# Patient Record
Sex: Male | Born: 1955 | Race: White | Hispanic: No | Marital: Married | Smoking: Current every day smoker
Health system: Southern US, Community
[De-identification: ages and names within clinical notes are randomized; demographics above are authoritative.]

## PROBLEM LIST (undated history)

## (undated) DIAGNOSIS — T8859XA Other complications of anesthesia, initial encounter: Secondary | ICD-10-CM

## (undated) DIAGNOSIS — T4145XA Adverse effect of unspecified anesthetic, initial encounter: Secondary | ICD-10-CM

## (undated) DIAGNOSIS — I2510 Atherosclerotic heart disease of native coronary artery without angina pectoris: Secondary | ICD-10-CM

## (undated) DIAGNOSIS — R7303 Prediabetes: Secondary | ICD-10-CM

## (undated) DIAGNOSIS — J449 Chronic obstructive pulmonary disease, unspecified: Secondary | ICD-10-CM

## (undated) DIAGNOSIS — R001 Bradycardia, unspecified: Secondary | ICD-10-CM

## (undated) DIAGNOSIS — N200 Calculus of kidney: Secondary | ICD-10-CM

## (undated) DIAGNOSIS — F419 Anxiety disorder, unspecified: Secondary | ICD-10-CM

## (undated) DIAGNOSIS — M1990 Unspecified osteoarthritis, unspecified site: Secondary | ICD-10-CM

## (undated) DIAGNOSIS — I6529 Occlusion and stenosis of unspecified carotid artery: Secondary | ICD-10-CM

## (undated) DIAGNOSIS — I10 Essential (primary) hypertension: Secondary | ICD-10-CM

## (undated) DIAGNOSIS — B1920 Unspecified viral hepatitis C without hepatic coma: Secondary | ICD-10-CM

## (undated) DIAGNOSIS — I739 Peripheral vascular disease, unspecified: Secondary | ICD-10-CM

## (undated) DIAGNOSIS — K5792 Diverticulitis of intestine, part unspecified, without perforation or abscess without bleeding: Secondary | ICD-10-CM

## (undated) DIAGNOSIS — Z87442 Personal history of urinary calculi: Secondary | ICD-10-CM

## (undated) DIAGNOSIS — J45909 Unspecified asthma, uncomplicated: Secondary | ICD-10-CM

## (undated) DIAGNOSIS — I700 Atherosclerosis of aorta: Secondary | ICD-10-CM

## (undated) DIAGNOSIS — I509 Heart failure, unspecified: Secondary | ICD-10-CM

## (undated) DIAGNOSIS — M109 Gout, unspecified: Secondary | ICD-10-CM

## (undated) DIAGNOSIS — J40 Bronchitis, not specified as acute or chronic: Secondary | ICD-10-CM

## (undated) DIAGNOSIS — E785 Hyperlipidemia, unspecified: Secondary | ICD-10-CM

## (undated) DIAGNOSIS — G894 Chronic pain syndrome: Secondary | ICD-10-CM

## (undated) DIAGNOSIS — F1011 Alcohol abuse, in remission: Secondary | ICD-10-CM

## (undated) DIAGNOSIS — G8929 Other chronic pain: Secondary | ICD-10-CM

## (undated) DIAGNOSIS — D509 Iron deficiency anemia, unspecified: Secondary | ICD-10-CM

## (undated) DIAGNOSIS — I447 Left bundle-branch block, unspecified: Secondary | ICD-10-CM

## (undated) DIAGNOSIS — I429 Cardiomyopathy, unspecified: Secondary | ICD-10-CM

## (undated) DIAGNOSIS — K219 Gastro-esophageal reflux disease without esophagitis: Secondary | ICD-10-CM

## (undated) DIAGNOSIS — Z79899 Other long term (current) drug therapy: Secondary | ICD-10-CM

## (undated) DIAGNOSIS — Z79891 Long term (current) use of opiate analgesic: Secondary | ICD-10-CM

## (undated) DIAGNOSIS — N183 Chronic kidney disease, stage 3 unspecified: Secondary | ICD-10-CM

## (undated) DIAGNOSIS — M549 Dorsalgia, unspecified: Secondary | ICD-10-CM

## (undated) DIAGNOSIS — Z7902 Long term (current) use of antithrombotics/antiplatelets: Secondary | ICD-10-CM

## (undated) HISTORY — PX: HERNIA REPAIR: SHX51

## (undated) HISTORY — DX: Unspecified viral hepatitis C without hepatic coma: B19.20

## (undated) HISTORY — PX: BACK SURGERY: SHX140

## (undated) HISTORY — PX: CHOLECYSTECTOMY: SHX55

## (undated) HISTORY — PX: SPINAL FUSION: SHX223

## (undated) HISTORY — PX: KNEE ARTHROSCOPY: SUR90

## (undated) HISTORY — PX: CARDIAC CATHETERIZATION: SHX172

## (undated) HISTORY — DX: Essential (primary) hypertension: I10

---
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
## 2004-03-22 ENCOUNTER — Other Ambulatory Visit: Payer: Self-pay

## 2005-03-02 ENCOUNTER — Emergency Department: Payer: Self-pay | Admitting: Unknown Physician Specialty

## 2005-03-02 ENCOUNTER — Other Ambulatory Visit: Payer: Self-pay

## 2005-04-06 ENCOUNTER — Ambulatory Visit: Payer: Self-pay | Admitting: Gastroenterology

## 2005-04-27 ENCOUNTER — Ambulatory Visit: Payer: Self-pay | Admitting: Gastroenterology

## 2005-06-07 ENCOUNTER — Ambulatory Visit: Payer: Self-pay | Admitting: General Surgery

## 2005-06-10 ENCOUNTER — Ambulatory Visit: Payer: Self-pay | Admitting: General Surgery

## 2005-07-12 DIAGNOSIS — K56609 Unspecified intestinal obstruction, unspecified as to partial versus complete obstruction: Secondary | ICD-10-CM

## 2005-07-12 HISTORY — PX: FINGER SURGERY: SHX640

## 2005-07-12 HISTORY — PX: COLON RESECTION: SHX5231

## 2005-07-12 HISTORY — DX: Unspecified intestinal obstruction, unspecified as to partial versus complete obstruction: K56.609

## 2005-08-31 ENCOUNTER — Inpatient Hospital Stay: Payer: Self-pay | Admitting: Internal Medicine

## 2005-08-31 ENCOUNTER — Other Ambulatory Visit: Payer: Self-pay

## 2005-09-01 ENCOUNTER — Other Ambulatory Visit: Payer: Self-pay

## 2005-10-20 ENCOUNTER — Other Ambulatory Visit: Payer: Self-pay

## 2005-10-21 ENCOUNTER — Inpatient Hospital Stay: Payer: Self-pay | Admitting: Internal Medicine

## 2005-11-04 ENCOUNTER — Inpatient Hospital Stay: Payer: Self-pay | Admitting: Cardiology

## 2005-11-18 ENCOUNTER — Other Ambulatory Visit: Payer: Self-pay

## 2005-11-18 ENCOUNTER — Inpatient Hospital Stay: Payer: Self-pay | Admitting: Internal Medicine

## 2007-05-10 ENCOUNTER — Inpatient Hospital Stay (HOSPITAL_COMMUNITY): Admission: RE | Admit: 2007-05-10 | Discharge: 2007-05-11 | Payer: Self-pay | Admitting: Neurosurgery

## 2007-06-10 ENCOUNTER — Encounter: Admission: RE | Admit: 2007-06-10 | Discharge: 2007-06-10 | Payer: Self-pay | Admitting: Neurosurgery

## 2007-07-18 ENCOUNTER — Emergency Department (HOSPITAL_COMMUNITY): Admission: EM | Admit: 2007-07-18 | Discharge: 2007-07-18 | Payer: Self-pay | Admitting: Emergency Medicine

## 2007-08-11 ENCOUNTER — Inpatient Hospital Stay (HOSPITAL_COMMUNITY): Admission: RE | Admit: 2007-08-11 | Discharge: 2007-08-15 | Payer: Self-pay | Admitting: Neurosurgery

## 2008-07-09 ENCOUNTER — Ambulatory Visit: Payer: Self-pay | Admitting: Gastroenterology

## 2008-07-22 ENCOUNTER — Emergency Department: Payer: Self-pay | Admitting: Emergency Medicine

## 2008-10-21 ENCOUNTER — Emergency Department: Payer: Self-pay

## 2008-10-22 ENCOUNTER — Observation Stay: Payer: Self-pay | Admitting: Internal Medicine

## 2008-10-23 ENCOUNTER — Inpatient Hospital Stay: Payer: Self-pay | Admitting: Psychiatry

## 2010-06-08 ENCOUNTER — Inpatient Hospital Stay: Payer: Self-pay | Admitting: Surgery

## 2010-07-29 ENCOUNTER — Ambulatory Visit: Payer: Self-pay | Admitting: Surgery

## 2010-08-14 ENCOUNTER — Ambulatory Visit: Payer: Self-pay | Admitting: Surgery

## 2010-08-21 ENCOUNTER — Inpatient Hospital Stay: Payer: Self-pay | Admitting: Surgery

## 2010-08-24 LAB — PATHOLOGY REPORT

## 2010-11-24 NOTE — H&P (Signed)
[name], [name]                ACCOUNT NO.:  [id number]   MEDICAL RECORD NO.:  [id number]          PATIENT TYPE:  INP   LOCATION:  3172                         FACILITY:  [facility]   PHYSICIAN:  Payton Doughty, M.D.      DATE OF BIRTH:  01/10/1956   DATE OF ADMISSION:  05/10/2007  DATE OF DISCHARGE:                              HISTORY & PHYSICAL   ADMISSION DIAGNOSIS:  Lumbar spondylosis.   SERVICE:  Neurosurgery.   This is a 55 year old, right-handed, white gentleman who for about 6  months has had increasing pain in his back and down his left leg that  started spontaneously without injury. MR shows disk at 5-1 off to the  left.  He underwent lumbar epidural steroids and has not done well with  those and he is admitted now for a lumbar diskectomy.   MEDICAL HISTORY:  Remarkable for hypertrophic cardiomyopathy. He has an  ejection fraction of 17-35% and he is being cleared by his cardiologist.  He has had an emergency colectomy for ruptured diverticulum in the past.  Surgery to remove kidney stones.   ALLERGIES:  CODEINE.   MEDICATIONS:  Coreg, Lasix, spironolactone and Exforge.   SOCIAL HISTORY:  He smokes three-quarters pack of cigarettes a day. He  drinks alcohol on a limited social basis.   FAMILY HISTORY:  Dad is 80, no other history is given.   REVIEW OF SYSTEMS:  Remarkable for sinus problems, back pain, leg pain,  arthritis.   PHYSICAL EXAMINATION:  HEENT:  Within normal limits.  He has reasonable  range of motion of the neck.  CHEST:  Has diffuse crackles, no wheezes.  CARDIAC:  Regular rate and rhythm with very distant heart sounds.  NEUROLOGIC:  He is awake, alert and oriented. His cranial nerves are  intact. Motor exam shows 5/5 strength throughout the upper and lower  extremities.  Sensory dysesthesias describing the left S1 distribution.  Reflexes are flicker at the knees, flicker at the right ankle, absent at  the left. Straight leg raise is positive on the  left. MR shows a disk  off the left side extends behind the body of S1.   CLINICAL IMPRESSION:  Herniated disk at L5-S1 eccentric to the left.  He  has failed conservative management.   PLAN:  Lumbar laminectomy, diskectomy and left-side L5-S1.  He has  gotten clearance from his cardiologist. The risks and benefits have been  discussed with him and he wishes to proceed.           ______________________________  Payton Doughty, M.D.     MWR/MEDQ  D:  05/10/2007  T:  05/10/2007  Job:  [id number]

## 2010-11-24 NOTE — Discharge Summary (Signed)
[name], [name]                ACCOUNT NO.:  [id number]   MEDICAL RECORD NO.:  [id number]          PATIENT TYPE:  INP   LOCATION:  3013                         FACILITY:  [facility]   PHYSICIAN:  Payton Doughty, M.D.      DATE OF BIRTH:  06/20/1956   DATE OF ADMISSION:  08/11/2007  DATE OF DISCHARGE:  08/15/2007                               DISCHARGE SUMMARY   ADMITTING DIAGNOSIS:  Recurrent disk, L5-S1.   DISCHARGE DIAGNOSIS:  Recurrent disk, L5-S1.   PROCEDURE:  L5-S1 laminectomy and diskectomy, posterior lumbar interbody  fusion with __________  fusion cages and posterolateral arthrodesis.   COMPLICATION:  None.   DISCHARGE STATUS:  Alive and well.   BODY OF TEXT:  A 55 year old right-handed white gentleman who had a  diskectomy in September and been doing well.  Had an increase in pain.  MR showed a disk recurrence.  Medical history is remarkable for  hypertrophic cardiomyopathy.   MEDICATIONS:  1. Coreg.  2. Lasix.  3. Spironolactone.   GENERAL EXAM:  Was intact.  NEUROLOGIC EXAM:  Was intact with a left S1 radiculopathy.   He was admitted.  After ascertaining normal laboratory values, underwent  a lumbar fusion at 5-1.  Postoperatively, he did well.  Leg pain is  gone.  Had some incisional pain.  He was on a PCA and Foley for 2 days.  That was stopped yesterday.  He is up and about, eating and voiding  normally, using only oral pain medications.  His incision is dry and  well-healing.  He is afebrile.  His vital signs are stable.  He is being  discharged home in the care of his family.  His follow-up will be in the  [place] office in a week for sutures.           ______________________________  Payton Doughty, M.D.     MWR/MEDQ  D:  08/15/2007  T:  08/15/2007  Job:  [id number]

## 2010-11-24 NOTE — H&P (Signed)
NAMESANTANA, [name]                ACCOUNT NO.:  [id number]   MEDICAL RECORD NO.:  [id number]          PATIENT TYPE:  INP   LOCATION:  3172                         FACILITY:  [facility]   PHYSICIAN:  Payton Doughty, M.D.      DATE OF BIRTH:  [birth year]   DATE OF ADMISSION:  08/11/2007  DATE OF DISCHARGE:                              HISTORY & PHYSICAL   ADMISSION DIAGNOSIS:  Recurrent disk on the left side at L5-S1.   HISTORY:  A 55 year old right-handed white gentleman that I operated on  in September for L5-S1 disk on the left.  He did well.  He was lifting  some heavy things and had increased back pain and pain down his left  leg.  MR shows a recurrent disk and he is admitted now for fusion.   PAST MEDICAL HISTORY:  Remarkable hypertrophic cardiomyopathy.  He did  okay with his operation in September.   ALLERGIES:  CODEINE.   MEDICATIONS:  1. Coreg.  2. Lasix.  3. Spironolactone.  4. Exforge.   SOCIAL HISTORY:  He smokes three-quarters of a pack cigarettes a day.  He drinks alcohol on a social basis.   FAMILY HISTORY:  Mom is 79, no other history is given.   REVIEW OF SYSTEMS:  Marked sinus problems, back pain, leg pain,  arthritis.   PHYSICAL EXAMINATION:  HEENT:  Within normal limits.  He has reasonable  range of motion of the neck.  CHEST:  Diffuse crackles.  No wheezes.  CARDIAC:  Regular rate and rhythm.  Very distant heart sounds.  NEUROLOGICALLY:  He is awake, alert and oriented.  His cranial nerves  are intact.  MOTOR:  Exam shows 5/5 strength throughout the upper and lower  extremities.  Sensory dysesthesias as described in the left S1 distribution.  Positive  straight leg on the right.  Left deep tendon reflexes are absent in the  left ankle.   DIAGNOSTICS:  MR shows recurrent disk at L5-S1 on the left.   CLINICAL IMPRESSION:  Recurrent disk at L5-S1.   PLAN:  Lumbar fusion.  Risks and benefits have been discussed with him  and he wishes to proceed.         ______________________________  Payton Doughty, M.D.     MWR/MEDQ  D:  08/11/2007  T:  08/11/2007  Job:  [id number]

## 2010-11-24 NOTE — Op Note (Signed)
NAMELOYCE, KLASEN                ACCOUNT NO.:  [id number]   MEDICAL RECORD NO.:  [id number]          PATIENT TYPE:  INP   LOCATION:  2899                         FACILITY:  [facility]   PHYSICIAN:  Payton Doughty, M.D.      DATE OF BIRTH:  1955/12/19   DATE OF PROCEDURE:  05/10/2007  DATE OF DISCHARGE:                               OPERATIVE REPORT   PREOPERATIVE DIAGNOSIS:  Herniated disk on the left side at L5-S1.   POSTOPERATIVE DIAGNOSIS:  Herniated disk on the left side at L5-S1.   OPERATIVE PROCEDURE:  L5-S1 laminectomy/diskectomy on the left.   SURGEON:  Payton Doughty, M.D.   ANESTHESIA:  General endotracheal.   PREP:  Sterile Betadine prep and scrub with alcohol wipe.   COMPLICATIONS:  None.   NURSE ASSISTANT:  Covington.   PROCEDURE:  A 55 year old gentleman with left S1 radiculopathy and a  herniated disk at 5-1 on the left was taken to the operating room,  smoothly anesthetized, intubated, and placed prone on the operating  table.  Following shave, prep and drape in the usual sterile fashion,  the skin was infiltrated with 1% lidocaine with 1:400,000 epinephrine.  The skin was incised at L5-S1, the lamina of L5 was dissected free in  the subperiosteal plane.  Intraoperative x-ray confirmed correctness  level.  Hemisemilaminectomy was carried out at L5 up to the top of the  ligamentum flavum that was removed in retrograde fashion.  S1 was  undercut and foraminotomy carried out over the left S1 root.  The root  was mobilized medially and fairly hard disk was identified, it was  excised with the 11 blade.  The disk space was explored and all  graspable fragments removed.  The nerve root was explored and all  quadrants found to be open.  The wound was irrigated, hemostasis  assured, and Depo-Medrol soaked fat was placed in the laminotomy defect.  Successive layers of O Vicryl, 2-0 Vicryl and 4-0 Vicryl were used to  close.  Benzoin and Steri-Strips were placed, made occlusive  with Telfa  and OpSite, and the patient returned to the recovery room in good  condition.           ______________________________  Payton Doughty, M.D.     MWR/MEDQ  D:  05/10/2007  T:  05/10/2007  Job:  [id number]

## 2010-11-24 NOTE — Op Note (Signed)
[name], [name]                ACCOUNT NO.:  [id number]   MEDICAL RECORD NO.:  [id number]          PATIENT TYPE:  INP   LOCATION:  3302                         FACILITY:  [facility]   PHYSICIAN:  Payton Doughty, M.D.      DATE OF BIRTH:  26-Nov-1955   DATE OF PROCEDURE:  08/11/2007  DATE OF DISCHARGE:                               OPERATIVE REPORT   PREOPERATIVE DIAGNOSIS:  Recurrent disk on the left side at L5-S1.   POSTOPERATIVE DIAGNOSIS:  Recurrent disk on the left side at L5-S1.   OPERATIVE PROCEDURE:  L5-S1 laminectomy and diskectomy, posterior lumbar  interbody fusion with Ray Threaded Fusion Cages, posterolateral  arthrodesis with OP-1.   SURGEON:  Payton Doughty, M.D., service neurosurgery.   ANESTHESIA:  General endotracheal.   PREPARATION:  Sterile Betadine prep and scrub with alcohol wipe.   COMPLICATIONS:  None.   NURSE ASSISTANT:  Covington.   DOCTOR ASSISTANT:  Hilda Lias, M.D.   BODY OF TEXT:  This is a 55 year old gentleman with a recurrent disk on  the left side at L5-S1.  Taken to the operating room and smoothly  anesthetized and intubated, placed prone on the operating table  following shave, prep and drape in the usual sterile fashion.  The skin  was infiltarated with 1% lidocaine with 1:400,000 epinephrine.  The skin  was incised from mid L4 to mid S1 and the lamina of L5 and the sacral  ala were exposed bilaterally in a subperiosteal plane.  Intraoperative x-  ray confirmed correctness of the level.  Having confirmed correctness of  the level, the pars interarticularis, remaining lamina and inferior  facet of L5 and the superior facet of S1 were removed using the high-  speed drill.  The ligamentum flavum was removed.  On the left side there  was some disk recurrence as well as some scarring around the left S1  root.  This was removed without difficulty.  The disk space was entered  and all graspable disk fragments were removed.  The same thing was  carried out on the right side, which had more normal anatomy.  Following  complete decompression, 12 x 21-mm Ray Threaded Fusion Cages were placed  using the standard technique.  They were packed with bone graft  harvested from the facet joints.  It was not felt that pedicle screws  were necessary.  The transverse process and sacral ala were decorticated  with a high-speed drill and packed with BMP on the extender matrix.  The  Ray Cages were packed with bone graft harvested from the facet  joints and capped.  Final x-ray showed good placement of cages.  Successive layers of 0 Vicryl, 2-0 Vicryl and 3-0 nylon were used to  close.  Betadine Telfa dressing was applied.  The patient returned to  the recovery room in good condition.           ______________________________  Payton Doughty, M.D.     MWR/MEDQ  D:  08/11/2007  T:  08/12/2007  Job:  [id number]

## 2011-04-01 LAB — DIFFERENTIAL
Basophils Absolute: 0
Basophils Relative: 0
Lymphocytes Relative: 13
Lymphs Abs: 1.2
Monocytes Absolute: 0.8
Monocytes Relative: 9
Neutrophils Relative %: 76

## 2011-04-01 LAB — BASIC METABOLIC PANEL
Calcium: 8.3 — ABNORMAL LOW
Chloride: 93 — ABNORMAL LOW
Creatinine, Ser: 1.38
GFR calc non Af Amer: 55 — ABNORMAL LOW

## 2011-04-01 LAB — COMPREHENSIVE METABOLIC PANEL
ALT: 14
AST: 20
BUN: 9
GFR calc non Af Amer: >60
Glucose, Bld: 107 — ABNORMAL HIGH
Potassium: 5.2 — ABNORMAL HIGH
Sodium: 131 — ABNORMAL LOW
Total Bilirubin: 0.9

## 2011-04-01 LAB — CBC
Hemoglobin: 14
MCV: 86.8
Platelets: 235
RBC: 4.79
WBC: 9.2

## 2011-04-01 LAB — URINALYSIS, ROUTINE W REFLEX MICROSCOPIC
Hgb urine dipstick: NEGATIVE
Nitrite: NEGATIVE
Protein, ur: NEGATIVE
Urobilinogen, UA: 0.2

## 2011-04-01 LAB — APTT: aPTT: 28

## 2011-04-01 LAB — TYPE AND SCREEN: Antibody Screen: NEGATIVE

## 2011-04-01 LAB — PROTIME-INR: Prothrombin Time: 11.4 — ABNORMAL LOW

## 2011-04-02 LAB — BASIC METABOLIC PANEL
BUN: 12
CO2: 29
GFR calc Af Amer: >60
GFR calc non Af Amer: >60
Glucose, Bld: 115 — ABNORMAL HIGH
Potassium: 4.2
Sodium: 126 — ABNORMAL LOW

## 2011-04-21 LAB — ABO/RH: ABO/RH(D): A POS

## 2011-04-21 LAB — URINALYSIS, ROUTINE W REFLEX MICROSCOPIC
Glucose, UA: NEGATIVE
Ketones, ur: NEGATIVE
Protein, ur: NEGATIVE
Specific Gravity, Urine: 1.005

## 2011-04-21 LAB — DIFFERENTIAL
Basophils Absolute: 0.1
Basophils Relative: 1
Eosinophils Absolute: 0.2
Eosinophils Relative: 3
Lymphocytes Relative: 15
Lymphs Abs: 1.2

## 2011-04-21 LAB — COMPREHENSIVE METABOLIC PANEL
ALT: 10
AST: 19
Alkaline Phosphatase: 82
BUN: 10
Chloride: 94 — ABNORMAL LOW
GFR calc non Af Amer: 54 — ABNORMAL LOW
Total Bilirubin: 0.7
Total Protein: 6.6

## 2011-04-21 LAB — TYPE AND SCREEN
ABO/RH(D): A POS
Antibody Screen: NEGATIVE

## 2011-04-21 LAB — CBC
Hemoglobin: 13.4
MCHC: 33.8
Platelets: 330
RDW: 15.4 — ABNORMAL HIGH

## 2011-04-21 LAB — APTT: aPTT: 29

## 2011-04-21 LAB — PROTIME-INR: Prothrombin Time: 12.2

## 2011-05-05 ENCOUNTER — Inpatient Hospital Stay: Payer: Self-pay | Admitting: Internal Medicine

## 2011-05-26 ENCOUNTER — Inpatient Hospital Stay: Payer: Self-pay | Admitting: Specialist

## 2011-12-30 ENCOUNTER — Ambulatory Visit: Payer: Self-pay | Admitting: Emergency Medicine

## 2011-12-30 DIAGNOSIS — I10 Essential (primary) hypertension: Secondary | ICD-10-CM

## 2011-12-30 LAB — COMPREHENSIVE METABOLIC PANEL
Albumin: 3.5 g/dL (ref 3.4–5.0)
Alkaline Phosphatase: 82 U/L (ref 50–136)
Bilirubin,Total: 0.6 mg/dL (ref 0.2–1.0)
Chloride: 103 mmol/L (ref 98–107)
Co2: 28 mmol/L (ref 21–32)
EGFR (African American): >60
EGFR (Non-African Amer.): >60
Osmolality: 279 (ref 275–301)
SGOT(AST): 22 U/L (ref 15–37)

## 2011-12-30 LAB — CBC WITH DIFFERENTIAL/PLATELET
Basophil #: 0.1 10*3/uL (ref 0.0–0.1)
Basophil %: 0.9 %
Eosinophil %: 2.5 %
HCT: 43.4 % (ref 40.0–52.0)
Lymphocyte #: 1.3 10*3/uL (ref 1.0–3.6)
Lymphocyte %: 17.1 %
MCHC: 32.6 g/dL (ref 32.0–36.0)
MCV: 77 fL — ABNORMAL LOW (ref 80–100)
Neutrophil %: 71 %
RBC: 5.61 10*6/uL (ref 4.40–5.90)
WBC: 7.7 10*3/uL (ref 3.8–10.6)

## 2012-01-11 ENCOUNTER — Inpatient Hospital Stay: Payer: Self-pay | Admitting: Emergency Medicine

## 2012-01-11 LAB — BASIC METABOLIC PANEL
Anion Gap: 6 — ABNORMAL LOW (ref 7–16)
BUN: 23 mg/dL — ABNORMAL HIGH (ref 7–18)
Co2: 25 mmol/L (ref 21–32)
Creatinine: 1.29 mg/dL (ref 0.60–1.30)
EGFR (African American): >60

## 2012-01-11 LAB — CBC WITH DIFFERENTIAL/PLATELET
Basophil #: 0 10*3/uL (ref 0.0–0.1)
Eosinophil #: 0 10*3/uL (ref 0.0–0.7)
HCT: 48.1 % (ref 40.0–52.0)
Lymphocyte %: 4.4 %
MCH: 24.4 pg — ABNORMAL LOW (ref 26.0–34.0)
MCHC: 31.3 g/dL — ABNORMAL LOW (ref 32.0–36.0)
MCV: 78 fL — ABNORMAL LOW (ref 80–100)
Monocyte %: 1.5 %
Neutrophil #: 15.2 10*3/uL — ABNORMAL HIGH (ref 1.4–6.5)
Neutrophil %: 93.8 %
Platelet: 239 10*3/uL (ref 150–440)
RDW: 15.5 % — ABNORMAL HIGH (ref 11.5–14.5)

## 2012-01-12 LAB — CBC WITH DIFFERENTIAL/PLATELET
Basophil #: 0 10*3/uL (ref 0.0–0.1)
Basophil %: 0 %
Eosinophil #: 0 10*3/uL (ref 0.0–0.7)
MCH: 24.7 pg — ABNORMAL LOW (ref 26.0–34.0)
MCHC: 31.7 g/dL — ABNORMAL LOW (ref 32.0–36.0)
Monocyte #: 0.3 x10 3/mm (ref 0.2–1.0)
Neutrophil %: 93.4 %
Platelet: 241 10*3/uL (ref 150–440)
RDW: 15.3 % — ABNORMAL HIGH (ref 11.5–14.5)

## 2012-05-20 ENCOUNTER — Inpatient Hospital Stay: Payer: Self-pay | Admitting: Internal Medicine

## 2012-05-20 LAB — PRO B NATRIURETIC PEPTIDE: B-Type Natriuretic Peptide: 1870 pg/mL — ABNORMAL HIGH (ref 0–125)

## 2012-05-20 LAB — BASIC METABOLIC PANEL
Anion Gap: 7 (ref 7–16)
BUN: 30 mg/dL — ABNORMAL HIGH (ref 7–18)
Calcium, Total: 8.8 mg/dL (ref 8.5–10.1)
Co2: 30 mmol/L (ref 21–32)
EGFR (Non-African Amer.): >60
Glucose: 132 mg/dL — ABNORMAL HIGH (ref 65–99)
Osmolality: 280 (ref 275–301)
Potassium: 4.1 mmol/L (ref 3.5–5.1)
Sodium: 136 mmol/L (ref 136–145)

## 2012-05-20 LAB — MAGNESIUM: Magnesium: 1.8 mg/dL

## 2012-05-20 LAB — TROPONIN I: Troponin-I: 0.02 ng/mL

## 2012-05-20 LAB — HEPATIC FUNCTION PANEL A (ARMC)
Albumin: 3 g/dL — ABNORMAL LOW (ref 3.4–5.0)
Bilirubin, Direct: 0.3 mg/dL — ABNORMAL HIGH (ref 0.00–0.20)
SGPT (ALT): 13 U/L (ref 12–78)

## 2012-05-20 LAB — CBC
MCH: 24 pg — ABNORMAL LOW (ref 26.0–34.0)
MCV: 75 fL — ABNORMAL LOW (ref 80–100)
Platelet: 247 10*3/uL (ref 150–440)
RBC: 5 10*6/uL (ref 4.40–5.90)
RDW: 14.9 % — ABNORMAL HIGH (ref 11.5–14.5)
WBC: 17 10*3/uL — ABNORMAL HIGH (ref 3.8–10.6)

## 2012-05-21 LAB — CBC WITH DIFFERENTIAL/PLATELET
Basophil #: 0 10*3/uL (ref 0.0–0.1)
Eosinophil #: 0 10*3/uL (ref 0.0–0.7)
Eosinophil %: 0 %
HCT: 40.8 % (ref 40.0–52.0)
HGB: 13 g/dL (ref 13.0–18.0)
Lymphocyte #: 0.8 10*3/uL — ABNORMAL LOW (ref 1.0–3.6)
Lymphocyte %: 4.9 %
MCH: 23.9 pg — ABNORMAL LOW (ref 26.0–34.0)
MCHC: 31.8 g/dL — ABNORMAL LOW (ref 32.0–36.0)
Monocyte #: 0.5 x10 3/mm (ref 0.2–1.0)
Monocyte %: 3.3 %
Neutrophil #: 14.1 10*3/uL — ABNORMAL HIGH (ref 1.4–6.5)
Platelet: 256 10*3/uL (ref 150–440)
RBC: 5.42 10*6/uL (ref 4.40–5.90)
RDW: 15.1 % — ABNORMAL HIGH (ref 11.5–14.5)

## 2012-05-21 LAB — BASIC METABOLIC PANEL
Anion Gap: 5 — ABNORMAL LOW (ref 7–16)
BUN: 29 mg/dL — ABNORMAL HIGH (ref 7–18)
Calcium, Total: 9.4 mg/dL (ref 8.5–10.1)
Creatinine: 1.13 mg/dL (ref 0.60–1.30)
EGFR (African American): >60
EGFR (Non-African Amer.): >60
Glucose: 207 mg/dL — ABNORMAL HIGH (ref 65–99)
Osmolality: 284 (ref 275–301)
Sodium: 136 mmol/L (ref 136–145)

## 2012-05-22 LAB — BASIC METABOLIC PANEL
Anion Gap: 7 (ref 7–16)
BUN: 31 mg/dL — ABNORMAL HIGH (ref 7–18)
Calcium, Total: 9.2 mg/dL (ref 8.5–10.1)
Chloride: 104 mmol/L (ref 98–107)
Creatinine: 1.09 mg/dL (ref 0.60–1.30)
EGFR (Non-African Amer.): >60
Glucose: 134 mg/dL — ABNORMAL HIGH (ref 65–99)
Osmolality: 290 (ref 275–301)
Potassium: 4.5 mmol/L (ref 3.5–5.1)

## 2012-05-22 LAB — CBC WITH DIFFERENTIAL/PLATELET
Basophil #: 0.1 10*3/uL (ref 0.0–0.1)
Basophil %: 0.3 %
Eosinophil %: 0 %
Lymphocyte %: 4.7 %
MCHC: 32.3 g/dL (ref 32.0–36.0)
Monocyte #: 0.9 x10 3/mm (ref 0.2–1.0)
Neutrophil %: 90.8 %
Platelet: 325 10*3/uL (ref 150–440)
RDW: 15.2 % — ABNORMAL HIGH (ref 11.5–14.5)

## 2012-05-23 LAB — CBC WITH DIFFERENTIAL/PLATELET
Basophil #: 0 10*3/uL (ref 0.0–0.1)
Eosinophil #: 0 10*3/uL (ref 0.0–0.7)
Eosinophil %: 0.1 %
HCT: 43.4 % (ref 40.0–52.0)
Lymphocyte #: 1.7 10*3/uL (ref 1.0–3.6)
Lymphocyte %: 11.5 %
MCH: 24.6 pg — ABNORMAL LOW (ref 26.0–34.0)
Monocyte %: 6.4 %
Neutrophil %: 81.8 %
Platelet: 311 10*3/uL (ref 150–440)
RBC: 5.64 10*6/uL (ref 4.40–5.90)
WBC: 15.1 10*3/uL — ABNORMAL HIGH (ref 3.8–10.6)

## 2012-05-24 LAB — EXPECTORATED SPUTUM ASSESSMENT W GRAM STAIN, RFLX TO RESP C

## 2013-02-23 ENCOUNTER — Ambulatory Visit: Payer: Self-pay | Admitting: Urology

## 2013-04-16 DIAGNOSIS — R310 Gross hematuria: Secondary | ICD-10-CM | POA: Insufficient documentation

## 2013-04-16 DIAGNOSIS — N200 Calculus of kidney: Secondary | ICD-10-CM | POA: Insufficient documentation

## 2013-05-06 ENCOUNTER — Inpatient Hospital Stay: Payer: Self-pay | Admitting: Internal Medicine

## 2013-05-06 LAB — CBC
HGB: 15.2 g/dL (ref 13.0–18.0)
MCH: 25.4 pg — ABNORMAL LOW (ref 26.0–34.0)
MCHC: 33 g/dL (ref 32.0–36.0)
MCV: 77 fL — ABNORMAL LOW (ref 80–100)
RDW: 15.5 % — ABNORMAL HIGH (ref 11.5–14.5)
WBC: 21.8 10*3/uL — ABNORMAL HIGH (ref 3.8–10.6)

## 2013-05-06 LAB — COMPREHENSIVE METABOLIC PANEL
Albumin: 3.9 g/dL (ref 3.4–5.0)
Bilirubin,Total: 1 mg/dL (ref 0.2–1.0)
Chloride: 101 mmol/L (ref 98–107)
Co2: 26 mmol/L (ref 21–32)
EGFR (African American): >60
EGFR (Non-African Amer.): >60
Glucose: 109 mg/dL — ABNORMAL HIGH (ref 65–99)
Osmolality: 274 (ref 275–301)
Potassium: 5 mmol/L (ref 3.5–5.1)

## 2013-05-06 LAB — URINALYSIS, COMPLETE
Bacteria: NONE SEEN
Bilirubin,UR: NEGATIVE
Blood: NEGATIVE
Glucose,UR: NEGATIVE mg/dL (ref 0–75)
Ketone: NEGATIVE
Nitrite: NEGATIVE
Protein: NEGATIVE
RBC,UR: 3 /HPF (ref 0–5)
Specific Gravity: 1.012 (ref 1.003–1.030)
Squamous Epithelial: <1

## 2013-05-06 LAB — LIPASE, BLOOD: Lipase: 38 U/L — ABNORMAL LOW (ref 73–393)

## 2013-05-07 LAB — BASIC METABOLIC PANEL
BUN: 32 mg/dL — ABNORMAL HIGH (ref 7–18)
Calcium, Total: 9.7 mg/dL (ref 8.5–10.1)
Chloride: 99 mmol/L (ref 98–107)
Co2: 30 mmol/L (ref 21–32)
Creatinine: 1.87 mg/dL — ABNORMAL HIGH (ref 0.60–1.30)
EGFR (Non-African Amer.): 40 — ABNORMAL LOW
Glucose: 130 mg/dL — ABNORMAL HIGH (ref 65–99)
Osmolality: 284 (ref 275–301)

## 2013-05-07 LAB — CBC WITH DIFFERENTIAL/PLATELET
Basophil #: 0 10*3/uL (ref 0.0–0.1)
Basophil %: 0.1 %
Lymphocyte #: 0.4 10*3/uL — ABNORMAL LOW (ref 1.0–3.6)
Lymphocyte %: 4 %
MCH: 25.8 pg — ABNORMAL LOW (ref 26.0–34.0)
MCV: 79 fL — ABNORMAL LOW (ref 80–100)
Monocyte #: 1.1 x10 3/mm — ABNORMAL HIGH (ref 0.2–1.0)
Neutrophil #: 8.7 10*3/uL — ABNORMAL HIGH (ref 1.4–6.5)
Platelet: 237 10*3/uL (ref 150–440)
RBC: 6.21 10*6/uL — ABNORMAL HIGH (ref 4.40–5.90)

## 2013-05-08 LAB — BASIC METABOLIC PANEL
BUN: 49 mg/dL — ABNORMAL HIGH (ref 7–18)
Calcium, Total: 9 mg/dL (ref 8.5–10.1)
Chloride: 103 mmol/L (ref 98–107)
Co2: 31 mmol/L (ref 21–32)
Creatinine: 1.64 mg/dL — ABNORMAL HIGH (ref 0.60–1.30)
EGFR (African American): 54 — ABNORMAL LOW
Glucose: 128 mg/dL — ABNORMAL HIGH (ref 65–99)
Osmolality: 294 (ref 275–301)
Potassium: 3.6 mmol/L (ref 3.5–5.1)
Sodium: 140 mmol/L (ref 136–145)

## 2013-05-08 LAB — CBC WITH DIFFERENTIAL/PLATELET
Basophil #: 0 10*3/uL (ref 0.0–0.1)
Eosinophil #: 0.1 10*3/uL (ref 0.0–0.7)
Eosinophil %: 1.3 %
HCT: 45.3 % (ref 40.0–52.0)
HGB: 15.2 g/dL (ref 13.0–18.0)
Lymphocyte #: 1.4 10*3/uL (ref 1.0–3.6)
Lymphocyte %: 15.4 %
MCHC: 33.5 g/dL (ref 32.0–36.0)
Monocyte #: 1.2 x10 3/mm — ABNORMAL HIGH (ref 0.2–1.0)
Monocyte %: 13.7 %
Neutrophil #: 6.2 10*3/uL (ref 1.4–6.5)
Neutrophil %: 69.4 %
RDW: 15.8 % — ABNORMAL HIGH (ref 11.5–14.5)

## 2013-05-09 LAB — BASIC METABOLIC PANEL
Calcium, Total: 8.1 mg/dL — ABNORMAL LOW (ref 8.5–10.1)
Chloride: 107 mmol/L (ref 98–107)
Co2: 30 mmol/L (ref 21–32)
Creatinine: 1.22 mg/dL (ref 0.60–1.30)
EGFR (African American): >60
Glucose: 118 mg/dL — ABNORMAL HIGH (ref 65–99)
Potassium: 3.4 mmol/L — ABNORMAL LOW (ref 3.5–5.1)
Sodium: 139 mmol/L (ref 136–145)

## 2013-05-09 LAB — CBC WITH DIFFERENTIAL/PLATELET
Basophil #: 0 10*3/uL (ref 0.0–0.1)
Eosinophil #: 0.3 10*3/uL (ref 0.0–0.7)
Eosinophil %: 3.3 %
HGB: 12.9 g/dL — ABNORMAL LOW (ref 13.0–18.0)
Lymphocyte #: 2.1 10*3/uL (ref 1.0–3.6)
Lymphocyte %: 21.9 %
MCHC: 33.5 g/dL (ref 32.0–36.0)
Neutrophil #: 6.2 10*3/uL (ref 1.4–6.5)
WBC: 9.7 10*3/uL (ref 3.8–10.6)

## 2013-05-11 LAB — CULTURE, BLOOD (SINGLE)

## 2014-03-22 ENCOUNTER — Emergency Department: Payer: Self-pay | Admitting: Emergency Medicine

## 2014-03-22 LAB — COMPREHENSIVE METABOLIC PANEL
ALT: 15 U/L
Albumin: 4 g/dL (ref 3.4–5.0)
Alkaline Phosphatase: 71 U/L
Anion Gap: 7 (ref 7–16)
BILIRUBIN TOTAL: 0.5 mg/dL (ref 0.2–1.0)
BUN: 20 mg/dL — AB (ref 7–18)
CO2: 28 mmol/L (ref 21–32)
CREATININE: 1.74 mg/dL — AB (ref 0.60–1.30)
Calcium, Total: 9.4 mg/dL (ref 8.5–10.1)
Chloride: 106 mmol/L (ref 98–107)
EGFR (African American): 50 — ABNORMAL LOW
GFR CALC NON AF AMER: 43 — AB
Glucose: 113 mg/dL — ABNORMAL HIGH (ref 65–99)
OSMOLALITY: 285 (ref 275–301)
Potassium: 4.4 mmol/L (ref 3.5–5.1)
SGOT(AST): 26 U/L (ref 15–37)
SODIUM: 141 mmol/L (ref 136–145)
Total Protein: 8 g/dL (ref 6.4–8.2)

## 2014-03-22 LAB — CBC WITH DIFFERENTIAL/PLATELET
BASOS ABS: 0.1 10*3/uL (ref 0.0–0.1)
Basophil %: 0.5 %
Eosinophil #: 0.1 10*3/uL (ref 0.0–0.7)
Eosinophil %: 0.7 %
HCT: 42.9 % (ref 40.0–52.0)
HGB: 14 g/dL (ref 13.0–18.0)
Lymphocyte #: 0.8 10*3/uL — ABNORMAL LOW (ref 1.0–3.6)
Lymphocyte %: 8.3 %
MCH: 25.8 pg — AB (ref 26.0–34.0)
MCHC: 32.6 g/dL (ref 32.0–36.0)
MCV: 79 fL — AB (ref 80–100)
Monocyte #: 0.6 x10 3/mm (ref 0.2–1.0)
Monocyte %: 6.3 %
NEUTROS ABS: 8.5 10*3/uL — AB (ref 1.4–6.5)
Neutrophil %: 84.2 %
Platelet: 220 10*3/uL (ref 150–440)
RBC: 5.43 10*6/uL (ref 4.40–5.90)
RDW: 15.3 % — AB (ref 11.5–14.5)
WBC: 10.1 10*3/uL (ref 3.8–10.6)

## 2014-03-22 LAB — LIPASE, BLOOD: Lipase: 62 U/L — ABNORMAL LOW (ref 73–393)

## 2014-03-23 ENCOUNTER — Inpatient Hospital Stay: Payer: Self-pay | Admitting: Surgery

## 2014-03-23 LAB — COMPREHENSIVE METABOLIC PANEL
ALT: 11 U/L — AB
Albumin: 4 g/dL (ref 3.4–5.0)
Alkaline Phosphatase: 71 U/L
Anion Gap: 8 (ref 7–16)
BUN: 30 mg/dL — ABNORMAL HIGH (ref 7–18)
Bilirubin,Total: 1.2 mg/dL — ABNORMAL HIGH (ref 0.2–1.0)
CALCIUM: 10 mg/dL (ref 8.5–10.1)
CHLORIDE: 94 mmol/L — AB (ref 98–107)
CO2: 33 mmol/L — AB (ref 21–32)
CREATININE: 2.2 mg/dL — AB (ref 0.60–1.30)
EGFR (African American): 38 — ABNORMAL LOW
EGFR (Non-African Amer.): 33 — ABNORMAL LOW
Glucose: 139 mg/dL — ABNORMAL HIGH (ref 65–99)
OSMOLALITY: 279 (ref 275–301)
Potassium: 3.9 mmol/L (ref 3.5–5.1)
SGOT(AST): 21 U/L (ref 15–37)
Sodium: 135 mmol/L — ABNORMAL LOW (ref 136–145)
TOTAL PROTEIN: 8.3 g/dL — AB (ref 6.4–8.2)

## 2014-03-23 LAB — CBC WITH DIFFERENTIAL/PLATELET
BASOS ABS: 0 10*3/uL (ref 0.0–0.1)
BASOS PCT: 0.3 %
EOS ABS: 0 10*3/uL (ref 0.0–0.7)
EOS PCT: 0.5 %
HCT: 47.5 % (ref 40.0–52.0)
HGB: 15.3 g/dL (ref 13.0–18.0)
Lymphocyte #: 0.7 10*3/uL — ABNORMAL LOW (ref 1.0–3.6)
Lymphocyte %: 8.4 %
MCH: 25.5 pg — ABNORMAL LOW (ref 26.0–34.0)
MCHC: 32.2 g/dL (ref 32.0–36.0)
MCV: 79 fL — AB (ref 80–100)
MONOS PCT: 14.2 %
Monocyte #: 1.3 x10 3/mm — ABNORMAL HIGH (ref 0.2–1.0)
NEUTROS ABS: 6.8 10*3/uL — AB (ref 1.4–6.5)
Neutrophil %: 76.6 %
PLATELETS: 230 10*3/uL (ref 150–440)
RBC: 5.98 10*6/uL — AB (ref 4.40–5.90)
RDW: 15.3 % — ABNORMAL HIGH (ref 11.5–14.5)
WBC: 8.9 10*3/uL (ref 3.8–10.6)

## 2014-03-23 LAB — URINALYSIS, COMPLETE
BILIRUBIN, UR: NEGATIVE
BLOOD: NEGATIVE
Glucose,UR: NEGATIVE mg/dL (ref 0–75)
Granular Cast: 1
Ketone: NEGATIVE
Nitrite: NEGATIVE
Ph: 5 (ref 4.5–8.0)
Protein: 100
RBC,UR: 9 /HPF (ref 0–5)
Specific Gravity: 1.032 (ref 1.003–1.030)
Squamous Epithelial: NONE SEEN
WBC UR: 9 /HPF (ref 0–5)

## 2014-03-23 LAB — TROPONIN I: Troponin-I: <0.02 ng/mL

## 2014-03-23 LAB — LIPASE, BLOOD: Lipase: 73 U/L (ref 73–393)

## 2014-03-24 LAB — BASIC METABOLIC PANEL
ANION GAP: 4 — AB (ref 7–16)
BUN: 31 mg/dL — ABNORMAL HIGH (ref 7–18)
CALCIUM: 8.6 mg/dL (ref 8.5–10.1)
CO2: 34 mmol/L — AB (ref 21–32)
Chloride: 99 mmol/L (ref 98–107)
Creatinine: 2.03 mg/dL — ABNORMAL HIGH (ref 0.60–1.30)
EGFR (African American): 42 — ABNORMAL LOW
EGFR (Non-African Amer.): 36 — ABNORMAL LOW
Glucose: 132 mg/dL — ABNORMAL HIGH (ref 65–99)
OSMOLALITY: 282 (ref 275–301)
Potassium: 3.7 mmol/L (ref 3.5–5.1)
SODIUM: 137 mmol/L (ref 136–145)

## 2014-03-24 LAB — CBC WITH DIFFERENTIAL/PLATELET
Basophil #: 0 10*3/uL (ref 0.0–0.1)
Basophil %: 0.5 %
EOS ABS: 0.2 10*3/uL (ref 0.0–0.7)
Eosinophil %: 2.6 %
HCT: 38.7 % — AB (ref 40.0–52.0)
HGB: 12.4 g/dL — AB (ref 13.0–18.0)
Lymphocyte #: 1.3 10*3/uL (ref 1.0–3.6)
Lymphocyte %: 18.1 %
MCH: 25.3 pg — AB (ref 26.0–34.0)
MCHC: 31.9 g/dL — ABNORMAL LOW (ref 32.0–36.0)
MCV: 79 fL — AB (ref 80–100)
Monocyte #: 1.1 x10 3/mm — ABNORMAL HIGH (ref 0.2–1.0)
Monocyte %: 16.3 %
NEUTROS PCT: 62.5 %
Neutrophil #: 4.4 10*3/uL (ref 1.4–6.5)
PLATELETS: 184 10*3/uL (ref 150–440)
RBC: 4.89 10*6/uL (ref 4.40–5.90)
RDW: 14.9 % — AB (ref 11.5–14.5)
WBC: 7 10*3/uL (ref 3.8–10.6)

## 2014-03-25 LAB — BASIC METABOLIC PANEL
Anion Gap: 4 — ABNORMAL LOW (ref 7–16)
BUN: 16 mg/dL (ref 7–18)
CALCIUM: 8.4 mg/dL — AB (ref 8.5–10.1)
CHLORIDE: 106 mmol/L (ref 98–107)
Co2: 29 mmol/L (ref 21–32)
Creatinine: 1.53 mg/dL — ABNORMAL HIGH (ref 0.60–1.30)
GFR CALC AF AMER: 58 — AB
GFR CALC NON AF AMER: 50 — AB
GLUCOSE: 113 mg/dL — AB (ref 65–99)
OSMOLALITY: 280 (ref 275–301)
POTASSIUM: 4.4 mmol/L (ref 3.5–5.1)
Sodium: 139 mmol/L (ref 136–145)

## 2014-03-25 LAB — CBC WITH DIFFERENTIAL/PLATELET
BASOS ABS: 0 10*3/uL (ref 0.0–0.1)
Basophil %: 0.3 %
Eosinophil #: 0.2 10*3/uL (ref 0.0–0.7)
Eosinophil %: 2.6 %
HCT: 39.1 % — ABNORMAL LOW (ref 40.0–52.0)
HGB: 12.3 g/dL — ABNORMAL LOW (ref 13.0–18.0)
LYMPHS ABS: 1.5 10*3/uL (ref 1.0–3.6)
Lymphocyte %: 19.1 %
MCH: 25.4 pg — ABNORMAL LOW (ref 26.0–34.0)
MCHC: 31.5 g/dL — ABNORMAL LOW (ref 32.0–36.0)
MCV: 81 fL (ref 80–100)
Monocyte #: 1 x10 3/mm (ref 0.2–1.0)
Monocyte %: 13.8 %
Neutrophil #: 4.9 10*3/uL (ref 1.4–6.5)
Neutrophil %: 64.2 %
PLATELETS: 179 10*3/uL (ref 150–440)
RBC: 4.85 10*6/uL (ref 4.40–5.90)
RDW: 15 % — ABNORMAL HIGH (ref 11.5–14.5)
WBC: 7.6 10*3/uL (ref 3.8–10.6)

## 2014-03-27 ENCOUNTER — Other Ambulatory Visit: Payer: Self-pay | Admitting: Surgery

## 2014-03-27 LAB — BASIC METABOLIC PANEL
Anion Gap: 8 (ref 7–16)
BUN: 17 mg/dL (ref 7–18)
Calcium, Total: 8.9 mg/dL (ref 8.5–10.1)
Chloride: 103 mmol/L (ref 98–107)
Co2: 28 mmol/L (ref 21–32)
Creatinine: 1.52 mg/dL — ABNORMAL HIGH (ref 0.60–1.30)
EGFR (Non-African Amer.): 51 — ABNORMAL LOW
GFR CALC AF AMER: 59 — AB
Glucose: 109 mg/dL — ABNORMAL HIGH (ref 65–99)
OSMOLALITY: 280 (ref 275–301)
POTASSIUM: 4.7 mmol/L (ref 3.5–5.1)
Sodium: 139 mmol/L (ref 136–145)

## 2014-04-16 DIAGNOSIS — N200 Calculus of kidney: Secondary | ICD-10-CM | POA: Insufficient documentation

## 2014-10-29 NOTE — Discharge Summary (Signed)
PATIENT NAME:  [name], [name] MR#:  [id number] DATE OF BIRTH:  09-01-1955  DATE OF ADMISSION:  05/20/2012 DATE OF DISCHARGE:  05/23/2012   PRIMARY CARE PHYSICIAN: Dr. Brunetta Genera   DISCHARGE DIAGNOSES:  1. Acute on chronic respiratory failure. 2. Pneumonia. 3. Chronic obstructive pulmonary disease exacerbation.  4. SIRS. 5. Congestive heart failure, systolic dysfunction, with ejection fraction 10 to 20%.  6. Dehydration.   CONDITION: Stable.   CODE STATUS: FULL CODE.   HOME MEDICATIONS:  1. Lasix 20 mg p.o. p.r.n.  2. Alprazolam 1 mg p.o. t.i.d.  3. Coreg 25 mg p.o. b.i.d.  4. Percocet 10/325 mg p.o. every six hours p.r.n.  5. Aspirin 325 mg p.o. daily.  6. Xopenex 1.25 mg per 3 mL inhalation 3 times a day p.r.n. for shortness of breath.  7. Advair 250 mcg/50 mcg p.o. b.i.d.  8. Prednisone 20 mg p.o. daily 2 tablets once a day for two days then taper 10 mg p.o. daily for two days, then 5 mg p.o. daily for two days.  9. Lisinopril 5 mg p.o. daily.  10. Benzonatate 100 mg p.o. every six hours p.r.n.  11. Levaquin 500 mg p.o. daily for seven days.   HOME OXYGEN: 2 liters by nasal cannula.   DIET: Low sodium diet.   ACTIVITY: As tolerated.   FOLLOW-UP CARE: Follow-up with PCP vitamin one week. The patient also needs follow-up CBC with PCP.   REASON FOR ADMISSION: Cough, sputum, shortness of breath for five days.   HOSPITAL COURSE: The patient is a 59 year old Caucasian male with a history of COPD, CHF, and liver cirrhosis who presented to the ED with shortness of breath, cough, sputum for five days. The patient also is on oxygen by nasal cannula. He quit smoking six months ago. The patient's chest x-ray showed right lower lobe pneumonia and was admitted for pneumonia. The patient ABG on admission showed 7.34, pCO2 62. BUN 30, creatinine 1.26, BNP 1870, white count 17. After admission the patient has been treated with Levaquin, solumedrol and nebulizer for pneumonia, COPD  exacerbation, and SIRS. The patient's Lasix was on hold due to dehydration. After above-mentioned treatment, the patient's symptoms have much improved. No shortness of breath, wheezing, only has a mild cough. The patient's white count decreased to 15.1. BUN is stable at 31. The patient wants to go home. The patient will be discharged to home today.   I discussed the patient's discharge plan with the patient and the case manager.   TIME SPENT: About 35 minutes.   ____________________________ Demetrios Loll, MD qc:drc D: 05/23/2012 15:07:28 ET T: 05/24/2012 11:09:37 ET JOB#: [id number]  cc: Demetrios Loll, MD, <Dictator> Meindert A. Brunetta Genera, MD Demetrios Loll MD ELECTRONICALLY SIGNED 05/25/2012 14:13

## 2014-10-29 NOTE — H&P (Signed)
PATIENT NAME:  [name], [name] MR#:  [id number] DATE OF BIRTH:  January 19, 1956  DATE OF ADMISSION:  05/20/2012  REFERRING PHYSICIAN: Dr. Cinda Quest   PRIMARY CARE PHYSICIAN: Dr. Brunetta Genera   CHIEF COMPLAINT: Cough, sputum, shortness of breath for five days.   HISTORY OF PRESENT ILLNESS: The patient is a 59 year old Caucasian male with a history of COPD, CHF with ejection fraction 10 to 20%, hypertension, and liver cirrhosis who presented to the ED with above chief complaint. The patient is alert, awake, oriented in no acute distress. The patient said he has had cough, sputum, shortness of breath for five days. In addition, he feels fever and chills. He used nebulizer without improvement. The patient is on 2.5 liters oxygen at home but he thinks it is not strong enough. He said he quit smoking six months ago. He denies any headache, dizziness. No palpitations, orthopnea, or nocturnal dyspnea. No leg edema. The patient was noted to have a right lower lobe pneumonia with chest x-ray and was treated with Levaquin in the ED.   PAST MEDICAL HISTORY:  1. Hypertension.  2. CHF with ejection fraction 10 to 20%. 3. COPD, on home oxygen.   4. Liver cirrhosis. 5. Hepatitis C.   PAST SURGICAL HISTORY:  1. Cholecystectomy, perforated.  2. Diverticulitis, status post surgery.   FAMILY HISTORY: Hypertension, CAD.   SOCIAL HISTORY: The patient quit smoking six months ago. Denies any alcohol drinking or illicit drugs.   REVIEW OF SYSTEMS: CONSTITUTIONAL: The patient denies fever or chills but no headache or dizziness. No weakness. EYES: No double vision or blurred vision. ENT: No postnasal drip, slurred speech, or dysphagia. No epistaxis. CARDIOVASCULAR: No chest pain, palpitations, orthopnea, or nocturnal dyspnea. No leg edema. PULMONARY: Positive for cough, sputum, shortness of breath, wheezing. No hemoptysis. GI: No abdominal pain, nausea, vomiting, or diarrhea. No melena or bloody stool. GU: No dysuria, hematuria,  or incontinence. SKIN: No rash or jaundice. HEMATOLOGY: No easy bruising, bleeding. NEUROLOGY: No syncope, loss of consciousness, or seizure. ENDOCRINE: No polyuria, polydipsia, heat or cold intolerance.   ALLERGIES: Codeine, Dilaudid.   HOME MEDICATIONS:  1. Xopenex 1.25 mg/3 mL one dose 3 times a day p.r.n.  2. Percocet 10/325 mg every six hours p.r.n.  3. Lasix 20 mg p.o. daily p.r.n.  4. Coreg 25 mg p.o. b.i.d.  5. Aspirin 325 mg p.o. once a day.  6. Xanax 1 mg p.o. t.i.d.  7. Advair 1 puff twice a day.   PHYSICAL EXAMINATION:   VITAL SIGNS: Temperature 99.4, blood pressure 113/53, pulse 84, oxygen saturation 95% on oxygen.   GENERAL: The patient is alert, awake, oriented in no acute distress.   HEENT: Pupils round, equal, reactive to light and accommodation. Dry oral mucosa. Clear oropharynx.   NECK: Supple. No JVD or carotid bruit. No lymphadenopathy. No thyromegaly.   CARDIOVASCULAR: S1, S2 regular rate and rhythm. No murmurs or gallops.   PULMONARY: Bilateral air entry, wheezing, crackles. No use of accessory muscles to breathe.   ABDOMEN: Soft. No distention or tenderness. No organomegaly. Bowel sounds present.   EXTREMITIES: No edema, clubbing, or cyanosis. No calf tenderness. Strong bilateral pedal pulses.   SKIN: No rash or jaundice.   NEUROLOGIC: Alert and oriented x3. No focal deficit. Power 5/5. Sensation intact. DTRs 2+.   LABORATORY DATA: ABG showed pH of 7.34, pCO2 62. CK 572. CK-MB 5.1. Troponin 0.02. WBC 17, hemoglobin 12, platelets 247, glucose 132, BUN 30, creatinine 1.26. Electrolytes normal. BNP 1870. Magnesium level 1.8.  Chest x-ray showed right middle and lower lobe infiltrate.   IMPRESSION:  1. Right lower lobe pneumonia.  2. SIRS.  3. Leukocytosis.  4. COPD exacerbation.  5. Acute on chronic respiratory failure.  6. Dehydration.  7. CHF, systolic dysfunction, with ejection fraction 10 to 20%, stable.  8. Hypertension, controlled.   PLAN  OF TREATMENT:  1. The patient will be admitted to tele floor.  2. Will continue oxygen. 3. Continue Levaquin IVPB daily. 4. Solu-Medrol IVPB. 5. Give Topamax p.r.n.  6. Continue Advair. 7. We will follow up blood culture, sputum culture, and CBC.  8. For dehydration, we will hold Lasix and follow-up BMP.  9. GI and DVT prophylaxis.   Discussed the patient's situation and plan of treatment with the patient.      TIME SPENT: About 55 minutes.   ____________________________ Demetrios Loll, MD qc:drc D: 05/20/2012 07:58:42 ET T: 05/20/2012 08:45:49 ET JOB#: [id number]  cc: Demetrios Loll, MD, <Dictator> Meindert A. Brunetta Genera, MD Demetrios Loll MD ELECTRONICALLY SIGNED 05/23/2012 16:37

## 2014-11-01 NOTE — Consult Note (Signed)
Brief Consult Note: Diagnosis: sbo.   Patient was seen by consultant.   Recommend further assessment or treatment.   Orders entered.   Comments: pt passing gas and pain resolved. NG in place. will get KUB this am and reevaluate.  Electronic Signatures: Florene Glen (MD)  (Signed 27-Oct-14 11:06)  Authored: Brief Consult Note   Last Updated: 27-Oct-14 11:06 by Florene Glen (MD)

## 2014-11-01 NOTE — H&P (Signed)
PATIENT NAME:  [name], [name] MR#:  [id number] DATE OF BIRTH:  Aug 01, 1955  DATE OF ADMISSION:  05/06/2013  PRIMARY CARE PROVIDER: Dr. Lamonte Sakai.   CHIEF COMPLAINT: Nausea, vomiting, abdominal pain.   HISTORY OF PRESENT ILLNESS: The patient is a 59 year old white male with previous history of abdominal hernia surgery as well as history of COPD, history of severe systolic CHF, listed as having hepatitis C and cirrhosis which he denies. Reports that he has chronic intermittent back pain and some epigastric discomfort, who has started having sharp abdominal pain since this morning and nausea and vomiting. The patient was seen in the ED, had a CT scan of the abdomen and pelvis with IV contrast which showed a small bowel obstruction with a transition point. Was seen by surgery and for unclear reason was told that he may be able to be discharged home. We were asked to admit the patient. He has a leukocytosis of 21,000, still having abdominal pain and distended abdomen. Therefore, we were asked to admit the patient. He denies any fevers or chills. No chest pain. He used to wear oxygen but he is not on oxygen anymore. Complains of sweats but no fevers. Also has yellow productive cough. Denies any urinary symptoms.   PAST MEDICAL HISTORY: Significant for:  1.  Hypertension.  2.  History of severe systolic CHF with EF 07% to 20%,.  3.  History of COPD, was on oxygen but not anymore.  4.  History listed as liver cirrhosis per his H and P from 2013, the patient denies this.  5.  History of hepatitis C.  6.  History of diverticulitis, status post incision and drainage.  7.  Status post hernia surgery.  8.  Status post cholecystectomy, perforated.  9.  Anxiety.  FAMILY HISTORY: Positive for hypertension, coronary artery disease.   SOCIAL HISTORY: The patient continues to smoke a few cigarettes a day. Denies alcohol or drug use.   CURRENT MEDICATIONS: Advair 250/50 one puff b.i.d., alprazolam 1 mg t.i.d.,  aspirin 325 daily, Coreg 25 mg 1 tab p.o. b.i.d., Lasix 20 daily as needed, lisinopril 5 daily, Percocet 10/325 q.6 p.r.n., Xopenex 1 inhalation 3 times a day as needed for shortness of breath.   REVIEW OF SYSTEMS: CONSTITUTIONAL: Denies fevers. Complains of chills. Complains of fatigue, weakness, abdominal pain. No weight loss. No weight gain.  EYES: No blurred or double vision. No pain. No redness. No inflammation. No glaucoma. No cataracts.  ENT: No tinnitus. No ear pain. No hearing loss. No seasonal or year-round allergies. No difficulty swallowing.  RESPIRATORY: Complains of productive cough. No wheezing. Has COPD.   CARDIOVASCULAR: Denies any chest pain, orthopnea, edema or arrhythmia.  GASTROINTESTINAL: Complains of nausea, vomiting, epigastric sharp pain. No hematemesis. No melena. No hemorrhoids.  GENITOURINARY: Denies any dysuria, hematuria, renal calc or frequency.  ENDOCRINE: Denies any polyuria, nocturia or thyroid problems.  HEMATOLOGIC AND LYMPHATIC: Denies any major bruisability or bleeding.  SKIN: No acne. No rash. No changes in mole, hair or skin.  MUSCULOSKELETAL: Has chronic back pain.  NEUROLOGIC:  No numbness. No CVA. No TIA. No seizures.  PSYCHIATRIC: No anxiety. No insomnia. No ADD.   PHYSICAL EXAMINATION: VITAL SIGNS: Temperature 98.3, pulse 87, respirations 18, blood pressure 148/89, O2 94%.  GENERAL: The patient is a well-developed male, currently uncomfortable.  HEENT: Head: Normocephalic, atraumatic.  Eyes: Pupils equally round, reactive to light and accommodation. No conjunctival pallor. No scleral icterus. Nose: There is no nasal lesions or drainage. Ears:  There is no erythema or drainage. Mouth: No exudate.  NECK: Supple and symmetric. No masses.  RESPIRATORY: Clear to auscultation bilaterally without any rales, rhonchi or wheezing.  CARDIOVASCULAR: Regular rate and rhythm. No murmurs, rubs clicks or gallops. PMI is not displaced.  ABDOMEN: Distended. There is  some tenderness, no guarding, no rebound. Diminished bowel sounds diffusely.  GENITOURINARY: Deferred.  MUSCULOSKELETAL: There is no erythema or swelling.  SKIN: No rash.  LYMPHATICS: No lymph nodes palpable.  VASCULAR: Good DP and PT pulses.  NEUROLOGICAL: Cranial nerves II through XII grossly intact.  PSYCHIATRIC: Awake, alert, oriented x 3. No focal deficits.   LABORATORY AND RADIOLOGICAL DATA: Glucose 109, BUN 22, creatinine 0.99, sodium 135, potassium 5.0, chloride 101, CO2 is 26, calcium is 9.5. LFTs are normal. WBC 21.8, hemoglobin 15.2, platelet count 218. CT scan of the abdomen shows small bowel obstruction with transition point within the anterior aspect of the lower abdomen, prominence of the proximal appendix, no significant surrounding inflammation, bladder is elongated and appeared tethered to due to the anterior wall of the lower pelvis. There is a small amount of free fluid in the pelvis.   ASSESSMENT AND PLAN: The patient is a 59 year old with abdominal pain, noted to have small bowel obstruction.  1.  Small bowel obstruction. Seen by surgery, they feel no intervention is needed at this point, however, the patient is still symptomatic, has significant abdominal pain, distention, I will go ahead and place an NG tube. I will ask surgery to come follow the patient for pain control and antinausea medications.  2.  Chronic obstructive pulmonary disease which is chronic without a flare. Continue Advair. We will place him on p.r.n. nebulizers.  3.  Chronic severe systolic congestive heart failure without any evidence of acute exacerbation. Since he will be n.p.o., we will place him on IV Lasix daily.  4.  Questionable history of liver cirrhosis. The patient denies.  5.  Leukocytosis, likely gastrointestinal-related. We will place him on IV Zosyn for now, follow CBC and blood cultures.  6.  Hypertension. I will place him on p.r.n. hydralazine since he will be n.p.o.  7.  Miscellaneous.  Will place him on heparin for deep vein thrombosis prophylaxis.   NOTE: 50 minutes spent.     ____________________________ Lafonda Mosses. Posey Pronto, MD shp:cs D: 05/06/2013 16:48:53 ET T: 05/06/2013 19:39:27 ET JOB#: [id number]  cc: Reyne Falconi H. Posey Pronto, MD, <Dictator> Alric Seton MD ELECTRONICALLY SIGNED 05/08/2013 12:15

## 2014-11-01 NOTE — Discharge Summary (Signed)
PATIENT NAME:  CORDERIUS, SARACENI MR#:  [id number] DATE OF BIRTH:  [birth year]  DATE OF ADMISSION:  05/06/2013 DATE OF DISCHARGE:  05/09/2013  ADMITTING PHYSICIAN: Shreyang H. Posey Pronto, MD  DISCHARGING PHYSICIAN:  Venetia Maxon. Elijio Galluzzo, MD  PROCEDURES PERFORMED:  None.  IMAGING: Plain abdominal x-ray x 3 which revealed a partial small bowel obstruction initially, final x-ray showed resolution of that.   CONSULTATIONS: Gastroenterology, Dr. Molly Maduro and Phoebe Perch, general surgery.   HOSPITAL COURSE: This pleasant gentleman was admitted through the Emergency Room complaining of severe abdominal pain with vomiting. His initial CT scan showed partial small bowel obstruction. General surgery, Dr. Leanora Cover, believed this was a surgical case and recommended the patient be discharged. However, he was found to dehydrated with leukocytosis and admitted to the medical service by Dr. Posey Pronto. The patient was seen on the medical floor by general surgery who ordered serial abdominal films. He was kept n.p.o. His bowel obstruction and ileus gradually resolved. He was started on an oral diet on day 2 of his admission, which he tolerated and was advanced to full liquids on the final day of his stay. The patient'Cael Worth white cell count returned to normal on antibiotics. General surgery evaluated him throughout his stay and did not deem him to be a surgical candidate and recommended conservative measures that he responded to quite well. The patient'Jayro Mcmath other comorbidities remained well controlled. The patient was discharged to home in satisfactory condition.   DISCHARGE MEDICATIONS:  1.  Furosemide 20 mg p.r.n. 2. Alprazolam 1 mg t.i.d. 3. Coreg 25 mg b.i.d. 4, Percocet 10/325 p.r.n. q.6 hours.  5.  Aspirin 325 mg daily.  6.  Lisinopril 5 mg daily.  7.  Xopenex inhaler t.i.d.  8.  Advair Diskus 250/50, 1 puff b.i.d.  9.  Amoxicillin, clavulanic acid 75 mg b.i.d. x 7 days.   DIET: Low sodium, low fat, low  cholesterol diet.   ACTIVITY LIMITATIONS: None.   FOLLOWUP: With Lamonte Sakai in 2 to 4 weeks. The patient instructed to return to the hospital if he developed recurrence of his abdominal pain.   ____________________________ Venetia Maxon. Elijio Dubray, MD sat:cs D: 05/20/2013 12:05:00 ET T: 05/20/2013 18:47:06 ET JOB#: [id number]  cc: Sheikh A. Elijio Armes, MD, <Dictator> Veverly Fells MD ELECTRONICALLY SIGNED 05/21/2013 13:33

## 2014-11-01 NOTE — Consult Note (Signed)
PATIENT NAME:  [name], MAYHALL MR#:  [id number] DATE OF BIRTH:  17-Jul-1955  DATE OF CONSULTATION:  05/06/2013  REFERRING PHYSICIAN:   CONSULTING PHYSICIAN:  Consuela Mimes, MD  HISTORY OF PRESENT ILLNESS: [name] is a 59 year old white male who sometime last week (he does not know which day) experienced an intense abdominal pain in his lower midline scar. This abated but he had a recurrence early this morning. With this pain, he had nausea but no vomiting, and he states that ever since his ventral hernia repair, he has "not been right" and has suffered from chronic constipation. He last had a bowel movement yesterday, and he is still passing flatus. He denies fever and chills.   PAST MEDICAL HISTORY: Alcohol dependence April 2010, COPD with exacerbation November 2012, cardiomyopathy with ejection fraction of 20%, hypertension, anxiety, history of hepatitis C virus, status post open drainage of intra-abdominal diverticular abscess 2007, status post sigmoid colectomy by Dr. Pat Patrick February 2012, status post large ventral hernia repair with mesh July 2013.   MEDICATIONS AND ALLERGIES: As listed on his chart, as are allergies.   SOCIAL HISTORY: The patient is married and is attended in the Emergency Department with his wife.   REVIEW OF SYSTEMS: Negative for 10 systems except as mentioned in the history of present illness above.   PHYSICAL EXAMINATION:  GENERAL: Disgruntled middle-aged man with a height of 5 feet 7 inches, weight 165 pounds, BMI 25.9.  VITAL SIGNS: Temperature 98.3, pulse 87, respirations 18, blood pressure 148/89, oxygen saturation 94% on room air at rest.  HEENT: Pupils equally round and reactive to light. Extraocular movements intact. Sclerae nonicteric. Oropharynx clear. Mucous membranes moist. Hearing intact to voice.  NECK: Supple with no tracheal deviation or jugular venous distention.  HEART: Regular rate and rhythm with no murmurs or rubs.  LUNGS: Clear to auscultation  with normal respiratory effort bilaterally.  ABDOMEN: Protuberant with a lower midline scar that fails to demonstrate recurrent ventral hernia with Valsalva maneuver and leg lift. The abdomen is not distended, but there is some tympany in the left mid abdomen. It is also nontender.  EXTREMITIES: No edema with normal capillary refill bilaterally.  NEUROLOGIC: Cranial nerves II through XII, motor and sensation grossly intact.   LABORATORY VALUES: The patient has normal electrolytes, creatinine of 1.0 and albumin at 3.9, a bilirubin of 1.0. White blood cell count 22,000, hemoglobin 15 and hematocrit 46% with a normal platelet count. Urinalysis is normal.   OTHER STUDIES: CT scan of the abdomen with IV and oral contrast reveals some mildly dilated loops of small intestine in the left abdomen with a maximum diameter being 31 mm. There are also areas of small intestine that are normal in caliber, and the stomach itself does not appear to be dilated. In addition, there is stool present in a relatively decompressed colon, but this stool and gas are present throughout the entire colon, being the ascending, the transverse, descending and the remaining sigmoid colon and rectum.   ASSESSMENT: I read Dr. Sherilyn Banker operative note, and he does not describe the type of mesh that was used but he does state that there was no omentum present and that the small intestine was adherent to the anterior abdominal wall even prior to his placing the mesh. Therefore, it appears likely that his small intestine is now adherent to the mesh and chronically so. Given the patient's multiple medical comorbidities and the untenable situation for repairing this problem surgically, I offered the patient  inpatient hospitalization for relative bowel rest and education regarding a low-fiber diet, but he declined.   PLAN: The patient chose to be discharged to home with prescription for lactulose 30 mL b.i.d. (to be adjusted to daily or t.i.d.  p.r.n.), Norco 5/325 one to 2 q.4 to 6 h. p.r.n. with 36 dispensed and no refills and Phenergan 12.5 mg 1 to 2 q.3 to 6 h p.r.n. with 12 dispensed. He was instructed to educate himself regarding a low-fiber diet and to call our office if he had a recurrence of symptoms. Otherwise, he was encouraged to seek the attention of his primary care physician.   ____________________________ Consuela Mimes, MD wfm:gb D: 05/06/2013 15:51:51 ET T: 05/06/2013 20:29:07 ET JOB#: [id number]  cc: Consuela Mimes, MD, <Dictator> Consuela Mimes MD ELECTRONICALLY SIGNED 05/14/2013 20:37

## 2014-11-02 NOTE — Consult Note (Signed)
Chief Complaint:  Subjective/Chief Complaint Tolerating regular diet, no further vomiting. Renal function has improved on ivf.   VITAL SIGNS/ANCILLARY NOTES: **Vital Signs.:   14-Sep-15 12:26  Temperature Temperature (F) 98.2  Celsius 36.7  Pulse Pulse 57  Respirations Respirations 17  Pulse Ox % Pulse Ox % 97  Pulse Ox Activity Level  At rest  Oxygen Delivery Room Air/ 21 %  *Intake and Output.:   Daily 14-Sep-15 07:00  Grand Totals Intake:  2469 Output:  2800    Net:  -331 24 Hr.:  -331  Oral Intake      In:  0  IV (Primary)      In:  2393  IV (Secondary)      In:  16  Other Intake cc     In:  60  Urine ml     Out:  2350  NG Suction ml     Out:  450  Length of Stay Totals Intake:  3087 Output:  3200    Net:  -113   Brief Assessment:  GEN no acute distress   Cardiac Regular  no murmur   Respiratory normal resp effort  clear BS   Gastrointestinal details normal Soft  Nontender  Bowel sounds normal   EXTR negative edema   Lab Results: Routine Chem:  14-Sep-15 04:55   Glucose, Serum  113  BUN 16  Creatinine (comp)  1.53  Sodium, Serum 139  Potassium, Serum 4.4  Chloride, Serum 106  CO2, Serum 29  Calcium (Total), Serum  8.4  Anion Gap  4  Osmolality (calc) 280  eGFR (African American)  58  eGFR (Non-African American)  50 (eGFR values <57m/min/1.73 m2 may be an indication of chronic kidney disease (CKD). Calculated eGFR is useful in patients with stable renal function. The eGFR calculation will not be reliable in acutely ill patients when serum creatinine is changing rapidly. It is not useful in  patients on dialysis. The eGFR calculation may not be applicable to patients at the low and high extremes of body sizes, pregnant women, and vegetarians.)  Routine Hem:  14-Sep-15 04:55   WBC (CBC) 7.6  RBC (CBC) 4.85  Hemoglobin (CBC)  12.3  Hematocrit (CBC)  39.1  Platelet Count (CBC) 179  MCV 81  MCH  25.4  MCHC  31.5  RDW  15.0  Neutrophil % 64.2   Lymphocyte % 19.1  Monocyte % 13.8  Eosinophil % 2.6  Basophil % 0.3  Neutrophil # 4.9  Lymphocyte # 1.5  Monocyte # 1.0  Eosinophil # 0.2  Basophil # 0.0 (Result(Haliey Romberg) reported on 25 Mar 2014 at 0[facility])   Assessment/Plan:  Assessment/Plan:  Assessment Acute kidney injury-improving, change ivf to ns. If discharged today will need met b in two days at pcp'Ronya [place] office Chronic systolic CHF-compensated SBO-improving and tolerating po diet, management per Gen surg Will follow with you   Electronic Signatures: TVeverly Fells(MD)  (Signed 14-Sep-15 14:00)  Authored: Chief Complaint, VITAL SIGNS/ANCILLARY NOTES, Brief Assessment, Lab Results, Assessment/Plan   Last Updated: 14-Sep-15 14:00 by TVeverly Fells(MD)

## 2014-11-02 NOTE — Consult Note (Signed)
Brief Consult Note: Diagnosis: sbo, aki, ischemic cardiomyopathy.   Patient was seen by consultant.   Consult note dictated.   Recommend to proceed with surgery or procedure.   Orders entered.   Comments: 64yCM pmh CAD, ICM EF 35% (2012), multiple abd surgeries p/w abd pain/n/v last BM/flatus 24hrs prior to arrival, Dx SBO  1. SBO: care per primary/surgery - currently NPO, NGT, pain meds -- pt takes iv equivalent 20mg  morphine as basal medicine at home, currently receiving morphine 2-4mg  IV q2h, if pain is uncontrolled - increase morphine to 4-6mg  (slightly higher than his home/basal dose), vte px surg - currently lovonox.  2. preoperative eval: moderate risk for moderate risk surgery from cardiac standpoint - no further investigation/trestment required prior to surgery: METs<4 given deconditioning, no active cp, anginal symptom, chf, arhythmia,valvular disease  3. AKI: prerenal in etiology -- ivf monitof uo/cr  4. cad with ICM: iv betablockade - switch to oral when tolerate PO  5. copd: advair    6. vte px: lovonox per primary full 44min [id number].  Electronic Signatures: Kilie Rund, Aaron Mose (MD)  (Signed 13-Sep-15 00:44)  Authored: Brief Consult Note   Last Updated: 13-Sep-15 00:44 by Lytle Butte (MD)

## 2014-11-02 NOTE — H&P (Signed)
Subjective/Chief Complaint abdominal pain and profuse vomiting.   History of Present Illness 59 y/o male with complex past surgical and medical history Perforated diverticulitis 2007 with drainage abdominal abcsess followed by sigmoid colectomy several years ago then repair of complex ventral hernia with mesh following that porcedure.  In oct 2014 admitted with PSBO resolved with NGT. He admits to weekly or bi-weekly episodes of crampy abdominal pain with distention associated with constipation. Presented yesterday to ER with recurrent abdominal pain, nausea and emesis, last flatus and BM 24 hours ago.  Seen in ER and then left after had CT scan and BM feeling better only to return with profuse emesis and worsening abdominal pain and distension.   Past History cardiomyopathy diverticulitis s/p cholecystectomy 2002 sigmoid colectomy 2012 ventral hernia repair with mesh 01/2012   Past Medical Health Coronary Artery Disease, Hypertension, Smoking, COPD, cardiomyopathy 20%EF   Primary Physician Spray   Code Status Full Code   Past Med/Surgical Hx:  COPD:   panic attacks:   HTN:   cardiomyopathy:   diverticulitis:   ascites:   htn:   Other, see comments: breathing problems  CHF:   colectomy:   diverticulectomy:   kidney stone surgery x 4:   knee surgery x 3:   back surgery x 2:   colonoscopy:   cholecystectomy:   ALLERGIES:  Codeine: Itching  Dilaudid: Headaches   Other Allergies none   HOME MEDICATIONS: Medication Instructions Status  LORazepam 1 mg oral tablet 1 tab(s) orally 3 times a day Active  Coreg 25 mg oral tablet 1 tab(s) orally 2 times a day Active  Percocet 10/325 1 tab(s) orally every 6 hours, As Needed Active   Family and Social History:  Family History Non-Contributory   Social History positive  tobacco, positive  tobacco (Current within 1 year), negative ETOH, negative Illicit drugs   + Tobacco Current (within 1 year)   Place of  Living Home   Review of Systems:  Subjective/Chief Complaint see above.   Abdominal Pain Yes   Constipation No   Nausea/Vomiting Yes   SOB/DOE No   Chest Pain No   Telemetry Reviewed NSR   Dysuria No   Tolerating Diet No  Nauseated  Vomiting   Medications/Allergies Reviewed Medications/Allergies reviewed   Physical Exam:  GEN cachectic, thin, NGT inplace with bilious output.   HEENT pale conjunctivae, PERRL   NECK supple  trachea midline   RESP normal resp effort  clear BS   CARD regular rate  no thrills  no JVD   ABD positive tenderness  denies Flank Tenderness  no hernia  soft  distended  midline scar, no hernia, distended, tinkling bowel sounds.   EXTR negative cyanosis/clubbing, negative edema   SKIN normal to palpation, No rashes   NEURO cranial nerves intact   PSYCH A+O to time, place, person, good insight   Lab Results: Hepatic:  12-Sep-15 16:17   Bilirubin, Total  1.2  Alkaline Phosphatase 71 (46-116 NOTE: New Reference Range 01/29/14)  SGPT (ALT)  11 (14-63 NOTE: New Reference Range 01/29/14)  SGOT (AST) 21  Total Protein, Serum  8.3  Albumin, Serum 4.0  Routine Chem:  12-Sep-15 16:17   Glucose, Serum  139  BUN  30  Creatinine (comp)  2.20  Sodium, Serum  135  Potassium, Serum 3.9  Chloride, Serum  94  CO2, Serum  33  Calcium (Total), Serum 10.0  Osmolality (calc) 279  eGFR (African American)  38  eGFR (Non-African  American)  33 (eGFR values <17m/min/1.73 m2 may be an indication of chronic kidney disease (CKD). Calculated eGFR is useful in patients with stable renal function. The eGFR calculation will not be reliable in acutely ill patients when serum creatinine is changing rapidly. It is not useful in  patients on dialysis. The eGFR calculation may not be applicable to patients at the low and high extremes of body sizes, pregnant women, and vegetarians.)  Anion Gap 8  Lipase 73 (Result(s) reported on 23 Mar 2014 at 04:45PM.)     17:21   Result Comment POSSIBLE PATHOGENIC - POSSIBLE PATHOGENIC CYSTEINE CRYSTALS CRYSTALS - SEEN IN URINE. LAB/DONALD [place] AT  - 1803 03/23/14. PATHOLOGIST TO REVIEW  - SLIDE. COMMENTS WILL APPEAR ON REPORT  - WHEN COMPLETE.  - NOTIFIED OF CRITICAL VALUE  Result(s) reported on 23 Mar 2014 at 06:09PM.  Cardiac:  12-Sep-15 16:17   Troponin I < 0.02 (0.00-0.05 0.05 ng/mL or less: NEGATIVE  Repeat testing in 3-6 hrs  if clinically indicated. >0.05 ng/mL: POTENTIAL  MYOCARDIAL INJURY. Repeat  testing in 3-6 hrs if  clinically indicated. NOTE: An increase or decrease  of 30% or more on serial  testing suggests a  clinically important change)  Routine UA:  12-Sep-15 17:21   Color (UA) Amber  Clarity (UA) Cloudy  Glucose (UA) Negative  Bilirubin (UA) Negative  Ketones (UA) Negative  Specific Gravity (UA) 1.032  Blood (UA) Negative  pH (UA) 5.0  Protein (UA) 100 mg/dL  Nitrite (UA) Negative  Leukocyte Esterase (UA) Trace (Result(s) reported on 23 Mar 2014 at 06:09PM.)  RBC (UA) 9 /HPF  WBC (UA) 9 /HPF  Bacteria (UA) TRACE  Epithelial Cells (UA) NONE SEEN  Mucous (UA) PRESENT  Granular Cast (UA) 1 /LPF  Routine Hem:  12-Sep-15 16:17   WBC (CBC) 8.9  RBC (CBC)  5.98  Hemoglobin (CBC) 15.3  Hematocrit (CBC) 47.5  Platelet Count (CBC) 230  MCV  79  MCH  25.5  MCHC 32.2  RDW  15.3  Neutrophil % 76.6  Lymphocyte % 8.4  Monocyte % 14.2  Eosinophil % 0.5  Basophil % 0.3  Neutrophil #  6.8  Lymphocyte #  0.7  Monocyte #  1.3  Eosinophil # 0.0  Basophil # 0.0 (Result(s) reported on 23 Mar 2014 at 04:29PM.)   Radiology Results: XRay:    11-Sep-15 16:11, Abdomen 3 Way Includes PA Chest  Abdomen 3 Way Includes PA Chest  REASON FOR EXAM:    abdominal pain, concern for free air  COMMENTS:   May transport without cardiac monitor    PROCEDURE: DXR - DXR ABDOMEN 3-WAY (INCL PA CXR)  - Mar 22 2014  4:11PM     CLINICAL DATA:  Left lower quadrant pain for 3 hr.  History of  diverticulitis and small bowel obstruction.    EXAM:  ABDOMEN SERIES    COMPARISON:  Abdominal radiographs 05/08/2013 and CT abdomen pelvis  05/06/2013    FINDINGS:  The heart is enlarged and stable. Lungs are well expanded and clear.  The trachea is midline.    No evidence of free intraperitoneal air on upright views. Bowel gas  pattern is nonobstructive.    There are few scattered nonspecific air-fluid levels within the  bowel loops on the upright views. Calcifications in the left upper  quadrant are consistent with splenic calcifications seen on prior  abdomen CT. Patient's known lower pole left renal stone is visible,  and measures approximately 1.4 cm. Status postcholecystectomy.  Interbody cages are present at L5-S1. Degenerative changesof the  lumbar spine.     IMPRESSION:  Nonobstructive bowel gas pattern. No evidence of free  intraperitoneal air.    Stable lower pole left renal stone.    Stable cardiomegaly with no acute findings in the chest.      Electronically Signed    By: Curlene Dolphin M.D.    On: 03/22/2014 17:16         Verified By: Sheppard Evens, M.D.,    12-Sep-15 19:28, Abdomen AP Only  Abdomen AP Only  REASON FOR EXAM:    NG tub e insertion  COMMENTS:   Bedside (portable):Y    PROCEDURE: DXR - DXR ABDOMEN AP ONLY  - Mar 23 2014  7:28PM     CLINICAL DATA:  Vomiting.  NG tube placement.    EXAM:  ABDOMEN - 1 VIEW    COMPARISON:  CT 03/22/14    FINDINGS:  Enteric contrast material has progressed into the colon. The NG tube  tip is in the stomach with side port below GE junction. Continued  small bowel dilatation compatible with partial small bowel  obstruction.     IMPRESSION:  1. NG tube tip is inthe stomach.  2. Persistent partial small bowel obstruction pattern with antegrade  progression of enteric contrast material into the colon.      Electronically Signed    By: Kerby Moors M.D.    On: 03/23/2014 19:50          Verified By: Angelita Ingles, M.D.,  [place]:    11-Sep-15 16:11, Abdomen 3 Way Includes PA Chest  PACS Image    11-Sep-15 17:47, CT Abdomen and Pelvis With Contrast  PACS Image    12-Sep-15 19:28, Abdomen AP Only  PACS Image  CT:    11-Sep-15 17:47, CT Abdomen and Pelvis With Contrast  CT Abdomen and Pelvis With Contrast  REASON FOR EXAM:    (1) abdominal pain, hx of colectomy, mesh,    obstruction.; (2) abdominal pain, hx  COMMENTS:   May transport without cardiac monitor    PROCEDURE: CT  - CT ABDOMEN / PELVIS  W  - Mar 22 2014  5:47PM     CLINICAL DATA:  Severe abdominal pain.    EXAM:  CT ABDOMEN AND PELVIS WITH CONTRAST    TECHNIQUE:  Multidetector CT imaging of the abdomen and pelvis was performed  using the standard protocol following bolus administration of  intravenous contrast.  CONTRAST:  80 cc Isovue-300    COMPARISON:  05/06/2013    FINDINGS:  Lung bases:    No acute pulmonary findings. No pleural effusion. The heart is  normal in size. No pericardial effusion. The distal esophagus is  grossly normal.    CT abdomen:    The liver is unremarkable. No focal lesions or biliary dilatation.  The gallbladder is surgically absent. No common bile duct  dilatation. The pancreas is unremarkable. No inflammation or mass.  Stable calcified splenic granulomas but no focal lesions. The  adrenal glands are unremarkable and stable. There are bilateral  renal calculi and bilateral renal cysts along with renal scarring  changes. No hydronephrosis and no obstructing ureteral calculi or  bladder calculi.    The stomach is distended as is the small bowel into the right lower  quadrant where there are it decompressed loops of small bowel. This  is likely due to adhesions in the right lower quadrant anteriorly  near the surgical abdominal  wall hernia mesh. The colon is filled  with fluid with moderate stool in the sigmoid colon and rectum.  Anastomosis  sutures are noted in the mid sigmoid area.    No mesenteric or retroperitoneal mass or adenopathy. Small scattered  lymph nodes are stable. The aorta and branch vessels are patent.  Moderate atheroscleroticcalcifications.    CT pelvis:    The bladder, prostate gland and seminal vesicles are unremarkable.  No pelvic mass or adenopathy. The bladder is grossly normal. No  inguinal mass or hernia.    The bony structures are unremarkable. Ray cage interbodyfusion  changes noted at L5-S1.     IMPRESSION:  1. Small bowel obstruction in the right lower quadrant likely due to  adhesions anteriorly near the abdominal wall mesh. No findings for  perforation.  2. Dilated fluid-filled colon with moderate stoolin the rectum and  colon.  3. Bilateral renal calculi and renal cysts but no obstructing  ureteral calculi.      Electronically Signed    By: Kalman Jewels M.D.    On: 03/22/2014 18:24         Verified By: Marlane Hatcher, M.D.,    Assessment/Admission Diagnosis 59 y/o male with adhesive SBO Significant cardiac disease ongoing tobacco abuse and dependence remote history of ETOH usage. Acute renal failure from dehydration.   Plan admit, npo, ngt serial xrays and exam. hydration medicine consult written for in am.   Electronic Signatures: Sherri Rad (MD)  (Signed 12-Sep-15 23:15)  Authored: CHIEF COMPLAINT and HISTORY, PAST MEDICAL/SURGIAL HISTORY, ALLERGIES, Other Allergies, HOME MEDICATIONS, FAMILY AND SOCIAL HISTORY, REVIEW OF SYSTEMS, PHYSICAL EXAM, LABS, Radiology, ASSESSMENT AND PLAN   Last Updated: 12-Sep-15 23:15 by Sherri Rad (MD)

## 2014-11-02 NOTE — Consult Note (Signed)
PATIENT NAME:  [name], [name] MR#:  [id number] DATE OF BIRTH:  Nov 27, 1955  DATE OF CONSULTATION:  03/24/2014  REFERRING PHYSICIAN:  Elta Guadeloupe A. Marina Gravel, MD  CONSULTING PHYSICIAN:  Aaron Mose. Hower, MD  PRIMARY CARE PHYSICIAN: Perrin Maltese, MD  CARDIOLOGIST: Dionisio David, MD.  REASON FOR CONSULTATION: Small-bowel obstruction, acute kidney injury, ischemic cardiomyopathy.   PATIENT'S ORIGINAL CHIEF COMPLAINT: Abdominal pain.  HISTORY OF PRESENT ILLNESS: This is a 59 year old Caucasian gentleman with past medical history of coronary artery disease; ischemic cardiomyopathy, ejection fraction of 35%, last known echocardiogram performed back in 2012; with a history of multiple abdominal surgeries including one for perforated diverticula as well as repair of complex ventral hernia. He originally presented on 03/22/2014 with a chief complaint of nausea, vomiting, abdominal pain. Workup revealed concerns for a small-bowel obstruction; however, the patient had a bowel movement in the Emergency Department, and, with relief of symptoms, left the hospital. He, however, returned for further workup and evaluation after not having a bowel movement or flatus for a 24-hour time period, worsening abdominal pain, as well as worsening nausea and vomiting. He was evaluated by general surgery and admitted under their service for small-bowel obstruction. Asked for medical consult for patient's acute kidney injury and history of cardiac illness. Currently, the patient is only complaining of abdominal pain, periumbilical in location, described only as "pain," intensity 4 to 5 out of 10 with a component of back pain as well. No worsening or relieving factors.  REVIEW OF SYSTEMS:  CONSTITUTIONAL: Denies fevers, chills. Positive for fatigue, weakness.  EYES: Denied blurred vision, double vision, double vision, or eye pain.  EARS, NOSE, THROAT: Denies tinnitus, ear pain, hearing loss.  RESPIRATORY: Denies cough, wheeze, shortness  of breath.  CARDIOVASCULAR: Denies chest pain, palpitations, edema.  GASTROINTESTINAL: Positive for nausea, vomiting, abdominal pain as described above.  MUSCULOSKELETAL: Denies pain in neck. Positive for back pain. Denies shoulder or knee pain. Denies further arthritic symptoms.  NEUROLOGIC: Denies paralysis, paresthesias.  PSYCHIATRIC: Denies anxiety or depressive symptoms.  SKIN: Denies any rashes or lesions.  PAST MEDICAL HISTORY: Includes COPD, hypertension, ischemic cardiomyopathy with ejection fraction between 25% and 35%, remote history of alcohol usage, coronary artery disease. He also had a history of perforated diverticula requiring surgery and repair complex ventral hernia with mesh.   SOCIAL HISTORY: Positive for tobacco use. Denies any current alcohol or drug usage.   FAMILY HISTORY: Positive for hypertension and coronary artery disease.   ALLERGIES: CODEINE AS WELL AS DILAUDID.  HOME MEDICATIONS: Include aspirin 325 mg p.o. q. daily, Coreg 25 mg p.o. b.i.d., Lasix 20 mg p.o. q. daily, Mobic 15 mg p.o. q. daily, Percocet 10/325 mg p.o. q. 6 hours as needed for pain, tramadol 50 mg p.o. 3 times daily.   PHYSICAL EXAMINATION:  VITAL SIGNS: Temperature 98.8, heart rate 79, respirations 20, blood pressure 111/64, saturating 92% on room air. Weight 68 kg, BMI 23.5.  GENERAL: Well-nourished, well-developed, Caucasian gentleman, currently in minimal distress given abdominal pain.  HEAD: Normocephalic, atraumatic.  EYES: Pupils equal, round, react to light. Extraocular muscles intact. No scleral icterus.  MOUTH: Dry mucosal membrane. Dentition intact. No abscess noted. EAR, NOSE, THROAT: Clear without exudates. No external lesions.  NECK: Supple. No thyromegaly. No nodules. No JVD.  PULMONARY: Clear to auscultation bilaterally without wheezes, rubs, or rhonchi. No use of accessory muscles. Good respiratory effort.  CHEST: Nontender to palpation.  CARDIOVASCULAR: S1, S2, regular rate  and rhythm. No murmurs, rubs, or gallops.  No edema. Pedal pulses 2+ bilaterally.  GASTROINTESTINAL: Soft, minimal distention, mild tenderness to palpation over epigastric periumbilical region without rebound or guarding. Hypoactive bowel sounds.  MUSCULOSKELETAL: No swelling, clubbing, or edema. Range of motion full in all extremities.  NEUROLOGIC: Cranial nerves II through XII intact. No gross focal neurological deficits. Sensation intact. Reflexes intact.  SKIN: No ulceration, lesions, rashes, or cyanosis. Skin warm, dry. Turgor intact.  PSYCHIATRIC: Mood, affect within normal limits. The patient is awake, alert, and oriented x 3. Insight and judgment intact.   LABORATORY DATA: Abdominal film performed revealing NG tube tip is in the appropriate spot in the stomach, as well as persistent partial small-bowel obstruction pattern with antegrade progression of enteric contrast material into the colon. Had a CAT scan performed on September 11 revealing small-bowel obstruction in the right lower quadrant likely due to adhesions anteriorly near the abdominal wall mesh. No findings of perforation. Remainder of laboratory data: Sodium 135, potassium 3.9, chloride  94, bicarbonate 33, BUN 30, creatinine 2.2, glucose 139. LFTs: Protein of 8.3, bilirubin 1.28, ALT 11, otherwise within normal limits. WBC of 8.9, hemoglobin 15.3, platelets of 230,000. Urinalysis negative for evidence of infection. EKG performed revealing normal sinus rhythm with evidence of left ventricular hypertrophy. No significant ST or T wave abnormalities.   ASSESSMENT AND PLAN: A 59 year old Caucasian gentleman with a history of coronary artery disease with ischemic cardiomyopathy, last known ejection fraction of 35% back in 2012, presenting with abdominal pain, nausea, vomiting, found to have a small-bowel obstruction as per consult for acute kidney injury as well as ischemic cardiomyopathy in the setting of small-bowel obstruction. 1.   Small-bowel obstruction. Will defer care to primary service, which is general surgery. Currently he is n.p.o. with NG tube suction and has pain medications ordered. At home, the patient takes the equivalent of 20 mg of intravenous morphine daily, which is his basal dosage. Currently receiving morphine 2 to 4 mg IV q. 2 hours. If pain is uncontrolled, can safely increase morphine to 4 to 6 mg, which will be a slightly higher dose than his home dose.  2.  Preoperative evaluation. He should be considered a moderate risk for moderate risk surgery from a cardiac standpoint. No further investigations or treatments required prior to surgery. His METS should be considered less than 4, given general deconditioning; however, has no active chest pain or further anginal symptoms, active CHF, arrhythmias, or valvular disease at this time as far as medications are concerned, as he is n.p.o. at this time. Agree with continuing beta blockade with intravenous beta blockers and switch to oral whenever tolerating.  3.  Acute kidney injury: Likely prerenal in etiology, given history. Provide intravenous fluid hydration and monitor urine output and renal function.  4.  Coronary artery disease with ischemic cardiomyopathy, ejection fraction of 35%. Once again, intravenous beta blockade and switch to oral when he is tolerating. We will hold ACE inhibitors given acute kidney injury.  5.  Chronic obstructive pulmonary disease. Continue with Advair.  6.  Venous thromboembolism prophylaxis. On Lovenox per primary service.  CODE STATUS: The patient is full code.   TIME SPENT: 45 minutes.    ____________________________ Aaron Mose. Hower, MD dkh:ST D: 03/24/2014 00:44:44 ET T: 03/24/2014 01:33:37 ET JOB#: [id number]  cc: Aaron Mose. Hower, MD, <Dictator> DAVID Woodfin Ganja MD ELECTRONICALLY SIGNED 03/24/2014 20:33

## 2014-11-03 NOTE — Consult Note (Signed)
PATIENT NAME:  [name], [name] MR#:  [id number] DATE OF BIRTH:  02/16/1956  DATE OF CONSULTATION:  01/11/2012  REFERRING PHYSICIAN:  Dr. Phylis Bougie  CONSULTING PHYSICIAN:  Demetrios Loll, MD  PRIMARY CARE PHYSICIAN: Lorelee Market, MD   REASON FOR CONSULTATION: Medical management.   HISTORY OF PRESENT ILLNESS: The patient is a 59 year old Caucasian male with a history of Hepatitis C, hypertension, CHF with cardiomyopathy, ejection fraction 10 to 20%. The patient follows up as outpatient with Dr. Humphrey Rolls. He has history of COPD and liver cirrhosis. He was admitted for hernia repair. The patient  is status post hernia repair and Dr. Phylis Bougie requests [facility] physician for medical management. The patient complains of abdominal pain, otherwise no other complaints. According to his wife, the patient had gained weight recently but she does not know the exact number. The patient has no other complaints. No shortness of breath, cough, sputum. No chest pain, palpitation, orthopnea, nocturnal dyspnea, or leg edema.   PAST MEDICAL HISTORY: As mentioned above:  1. Hypertension. 2. Congestive heart failure. 3. Cardiomyopathy. 4. Chronic obstructive pulmonary disease. 5. Liver cirrhosis. 6. History of diverticulitis status post laparoscopy.   PAST SURGICAL HISTORY:  1. History of diverticulitis with laparoscopy. 2. Sigmoid colectomy.  FAMILY HISTORY: Coronary artery disease and hypertension.   SOCIAL HISTORY: The patient quit smoking years ago. Denies any alcohol or illicit drugs.   REVIEW OF SYSTEMS: CONSTITUTIONAL: The patient denies any fever or chills. No headache. No dizziness. No weakness. EYES: No double vision. No blurred vision. ENT: No epistaxis, postnasal drip, dysphagia, slurred speech. CARDIOVASCULAR: No chest pain, palpitation, orthopnea, nocturnal dyspnea. No leg edema. PULMONARY: No cough, sputum, shortness of breath, or hemoptysis. GI: Positive for abdominal pain but no nausea, vomiting, or  diarrhea. GU: No dysuria, hematuria, or incontinence. SKIN: No rash or jaundice. NEUROLOGIC: No syncope, loss of consciousness or seizure.   ALLERGIES: Codeine and Dilaudid.   MEDICATIONS: 1. Advair 250 mcg/50 mcg 1 puff b.i.d. p.r.n.  2. Alprazolam 1 mg p.o. t.i.d.  3. Aspirin 325 mg p.o. daily.  4. Coreg 25 mg p.o. b.i.d.  5. Lasix 20 mg p.o. p.r.n.  6. Percocet 10/325 1 tablet p.o. q.6 hours p.r.n.  7. Xopenex 1.25 mg inhalation 3 times a day p.r.n.   PHYSICAL EXAMINATION:   VITAL SIGNS: Temperature 98.6, blood pressure 163/86, pulse 67, respirations 18, oxygen saturation 91% on oxygen 2 liters.   GENERAL: The patient is alert, awake, oriented in no acute distress.   HEENT: Pupils are round, equal, reactive to light and accommodation.   NECK: Supple. No JVD or carotid bruits. No lymphadenopathy. No thyromegaly. Clear oropharynx. Moist oral mucosa.   CARDIOVASCULAR: S1 and S2 regular rate and rhythm. No murmurs or gallops.   PULMONARY: Bilateral air entry. No wheezing or rales.   ABDOMEN: Soft. Bowel sounds weak. It is difficult to estimate whether the patient has organomegaly due to abdominal tenderness and status post surgery.   EXTREMITIES: No edema, clubbing, or cyanosis. No calf tenderness. Strong bilateral pedal pulses.    SKIN: No rash or jaundice.   NEUROLOGIC: Alert and oriented x3. No focal deficits. Power 5/5. Sensation intact. Deep tendon reflexes 2+.   LABORATORY, DIAGNOSTIC, AND RADIOLOGICAL DATA: No labs today or recent labs.   IMPRESSION:  1. Status post hernia repair.  2. Hypertension, controlled.  3. Congestive heart failure with cardiomyopathy, ejection fraction 10 to 81%, systolic dysfunction, chronic, stable.  4. Chronic obstructive pulmonary disease.  RECOMMENDATIONS: 1. The patient needs  telemonitor.  2. We will give Lopressor IV to control blood pressure and heart rate. After resuming diet the patient may resume Coreg, aspirin, ACE inhibitor for  CHF with low ejection fraction.   3. We will start Lasix 20 mg IV q.12 hours. 4. Check CBC, BMP. 5. For COPD will continue Xopenex and Advair.  6. The patient needs GI and DVT prophylaxis but since the patient is status post surgery will not give any aspirin or Lovenox at this time. Will give TEDs for DVT prophylaxis. May resume aspirin and give Lovenox for DVT prophylaxis later.   Discussed the patient's situation and recommendations with the patient, the patient's wife and family.   TIME SPENT: About 55 minutes.   ____________________________ Demetrios Loll, MD qc:drc D: 01/11/2012 19:16:29 ET T: 01/12/2012 08:56:03 ET JOB#: [id number]  cc: Demetrios Loll, MD, <Dictator> Demetrios Loll MD ELECTRONICALLY SIGNED 01/12/2012 14:59

## 2014-11-03 NOTE — Op Note (Signed)
PATIENT NAME:  [name], [name] MR#:  [id number] DATE OF BIRTH:  1955-09-08  DATE OF PROCEDURE:  01/11/2012  INDICATION FOR PROCEDURE: This patient was seen by me because of the large ventral hernia. This patient had a colon resection performed for diverticulitis and since then patient developed hernia and has been getting worse. On examination he was found to have multiple hernia defects in all of the incision which was from the middle of the abdomen down to the xiphoid process. Patient has been complaining of chest pressure, also increasing abdominal pain and swelling. Patient was then advised to have this repaired performed. Cardiac-wise he was a bad cardiac patient. He also has cardiomyopathy and his cardiac clearance was taken and then patient was then brought to surgery.   PREOPERATIVE DIAGNOSIS: Multiple ventral hernias.   POSTOPERATIVE DIAGNOSIS: Multiple ventral hernias.   PROCEDURES:  1. Exploratory laparotomy, lysis of massive small bowel adhesions.  2. Insertion of large mesh and repair of ventral hernia.   SURGEON: Jimmy Plessinger S. Cinsere Mizrahi, MD  OPERATIVE FINDINGS: Patient was found to have multiple defects in the fascia as well as multiple loops of small bowel were stuck to the abdominal wall from inside because patient really did not have any omentum left and the small bowel was almost plastered to the peritoneum and the broken up fascia. It was very hard to release those adhesions.   DESCRIPTION OF PROCEDURE: After patient was put to sleep, the Foley catheter was inserted. The abdomen was entered carefully because of multiple adhesions of the small bowel to the peritoneum. After we cut the skin we were already down. There was no fascia. We were already down to the middle of the abdomen. The peritoneum was opened and I released small bowel adhesions all around and there were 2 or 3 loops which were stuck to the upper part of the incision was plastered to the peritoneum. I had a hard time removing  this but there was no hole in the small bowel. I inspected all the small bowel area and there were no holes in it. After that bleeding was stopped from adhesions, part with the Bovie and after we completely lysed adhesions we put a sponge on top of it. In the lower part of incision patient had the bladder almost into the wound, so I dissected off the bladder and pushed it down into the abdomen into the retroperitoneal space and find the fascia. I put 3 or 4 stitches of fascia there to anchor to each other and after that a large piece of mesh was then put in and then stapled together with a stapler all around and then sutured with 0 Surgilon sutures all around. After that we closed the fascia and the mesh together again with taking some of the Surgilon sutures all around so it was a long tedious repair. After this was done skin was closed with staples. Pressure dressing applied. Patient tolerated procedure well, sent to recovery room in satisfactory condition.   ____________________________ Welford Roche Phylis Bougie, MD msh:cms D: 01/11/2012 16:07:30 ET T: 01/11/2012 16:41:48 ET JOB#: [id number]  cc: Thorne Wirz S. Phylis Bougie, MD, <Dictator> Dionisio David, MD  Sharene Butters MD ELECTRONICALLY SIGNED 01/18/2012 8:41

## 2015-04-18 DIAGNOSIS — Z87442 Personal history of urinary calculi: Secondary | ICD-10-CM | POA: Insufficient documentation

## 2016-01-05 DIAGNOSIS — I214 Non-ST elevation (NSTEMI) myocardial infarction: Secondary | ICD-10-CM | POA: Insufficient documentation

## 2016-01-22 ENCOUNTER — Telehealth: Payer: Self-pay | Admitting: Gastroenterology

## 2016-01-22 NOTE — Telephone Encounter (Signed)
Left voice message for patient to call and schedule appointment with Dr. Wohl for Hep C °

## 2016-02-10 DIAGNOSIS — B1920 Unspecified viral hepatitis C without hepatic coma: Secondary | ICD-10-CM

## 2016-02-10 HISTORY — DX: Unspecified viral hepatitis C without hepatic coma: B19.20

## 2016-02-27 ENCOUNTER — Other Ambulatory Visit: Payer: Self-pay

## 2016-03-01 ENCOUNTER — Ambulatory Visit (INDEPENDENT_AMBULATORY_CARE_PROVIDER_SITE_OTHER): Payer: Medicare Other | Admitting: Gastroenterology

## 2016-03-01 ENCOUNTER — Encounter: Payer: Self-pay | Admitting: Gastroenterology

## 2016-03-01 VITALS — BP 133/69 | HR 58 | Temp 98.3°F | Ht 67.0 in | Wt 156.0 lb

## 2016-03-01 DIAGNOSIS — B182 Chronic viral hepatitis C: Secondary | ICD-10-CM | POA: Diagnosis not present

## 2016-03-01 NOTE — Progress Notes (Signed)
Gastroenterology Consultation  Referring Provider:     No ref. provider found Primary Care Physician:  Perrin Maltese, MD Primary Gastroenterologist:  Dr. Allen Norris     Reason for Consultation:     Hepatitis C        HPI:   [name] is a 60 y.o. y/o male referred for consultation & management of Hepatitis C by Dr. Perrin Maltese, MD.  This patient comes today after being found to have a hepatitis C antibody positive.  The patient reports that he had high-risk activity many years ago but nothing in the last decade or more.  The patient also reports that he did not know that he had hepatitis C until his recent blood tests.  There is no report of any nausea vomiting fevers chills black stools or bloody stools.  The patient does report that he was told approximately 10 years ago by Dr. Gustavo Lah that he has cirrhosis although the patient's CT scan in 2015 did not show any signs of liver disease.  The patient also has no physical signs of cirrhosis.  Past Medical History:  Diagnosis Date  . Hypertension     Past Surgical History:  Procedure Laterality Date  . CHOLECYSTECTOMY    . HERNIA REPAIR      Prior to Admission medications   Medication Sig Start Date End Date Taking? Authorizing Provider  ALPRAZolam Duanne Moron) 1 MG tablet Take 1 mg by mouth 3 (three) times daily. 01/06/16  Yes Historical Provider, MD  ALPRAZolam Duanne Moron) 1 MG tablet  04/01/14  Yes Historical Provider, MD  aspirin (GOODSENSE ASPIRIN) 325 MG tablet Take 325 mg by mouth.   Yes Historical Provider, MD  carvedilol (COREG) 25 MG tablet Take by mouth.   Yes Historical Provider, MD  furosemide (LASIX) 20 MG tablet Take 20 mg by mouth.   Yes Historical Provider, MD  oxyCODONE-acetaminophen (PERCOCET) 10-325 MG tablet Take by mouth.   Yes Historical Provider, MD  sacubitril-valsartan (ENTRESTO) 97-103 MG Take 1 tablet by mouth 2 (two) times daily.   Yes Historical Provider, MD    Family History  Problem Relation Age of Onset    . COPD Mother      Social History  Substance Use Topics  . Smoking status: Former Research scientist (life sciences)  . Smokeless tobacco: Never Used  . Alcohol use No    Allergies as of 03/01/2016 - never reviewed  Allergen Reaction Noted  . Codeine Hives 04/16/2014    Review of Systems:    All systems reviewed and negative except where noted in HPI.   Physical Exam:  BP 133/69   Pulse (!) 58   Temp 98.3 F (36.8 C) (Oral)   Ht 5\' 7"  (1.702 m)   Wt 156 lb (70.8 kg)   BMI 24.43 kg/m  No LMP for male patient. Psych:  Alert and cooperative. Normal mood and affect. General:   Alert,  Well-developed, well-nourished, pleasant and cooperative in NAD Head:  Normocephalic and atraumatic. Eyes:  Sclera clear, no icterus.   Conjunctiva pink. Ears:  Normal auditory acuity. Nose:  No deformity, discharge, or lesions. Mouth:  No deformity or lesions,oropharynx pink & moist. Neck:  Supple; no masses or thyromegaly. Lungs:  Respirations even and unlabored.  Clear throughout to auscultation.   No wheezes, crackles, or rhonchi. No acute distress. Heart:  Regular rate and rhythm; no murmurs, clicks, rubs, or gallops. Abdomen:  Normal bowel sounds.  No bruits.  Soft, non-tender and non-distended without masses, hepatosplenomegaly or hernias  noted.  No guarding or rebound tenderness.  Negative Carnett sign.   Rectal:  Deferred.  Msk:  Symmetrical without gross deformities.  Good, equal movement & strength bilaterally. Pulses:  Normal pulses noted. Extremities:  No clubbing or edema.  No cyanosis. Neurologic:  Alert and oriented x3;  grossly normal neurologically. Skin:  Intact without significant lesions or rashes.  No jaundice. Lymph Nodes:  No significant cervical adenopathy. Psych:  Alert and cooperative. Normal mood and affect.  Imaging Studies: No results found.  Assessment and Plan:   [name] is a 60 y.o. y/o male who comes in today with a finding of possible hepatitis C. The patient will have  labs sent off to check for a viral load and genotype.  The patient will also have labs sent off for other possible causes of abnormal liver enzymes and a fibrosis scan.  Depending on the results of these tests we will then determine a course of action for his treatment. The patient has been explained the plan and agrees with it.   Note: This dictation was prepared with Dragon dictation along with smaller phrase technology. Any transcriptional errors that result from this process are unintentional.

## 2016-03-01 NOTE — Patient Instructions (Addendum)
You are scheduled for a RUQ US/tissue elastography at [facility] on Monday, August 28th @ 9:00am. Please arrive at 8:45am and check in at the medical mall. You cannot have anything to eat or drink after midnight Sunday night. If you need to rescheduled for any reason, Please call central scheduling at [phone].

## 2016-03-03 ENCOUNTER — Telehealth: Payer: Self-pay

## 2016-03-03 NOTE — Telephone Encounter (Signed)
-----   Message from Lucilla Lame, MD sent at 03/03/2016  8:01 AM EDT ----- Let the patient know that all of his labs have come back negative so far and he is not immune to hepatitis a or B and may need a vaccination for that. We are still waiting for his hepatitis C test come back.

## 2016-03-03 NOTE — Telephone Encounter (Signed)
Pt notified of results

## 2016-03-08 ENCOUNTER — Ambulatory Visit
Admission: RE | Admit: 2016-03-08 | Discharge: 2016-03-08 | Disposition: A | Discharge status: Home / Self Care | Payer: Medicare Other | Source: Ambulatory Visit | Attending: Gastroenterology | Admitting: Gastroenterology

## 2016-03-08 ENCOUNTER — Ambulatory Visit: Payer: Self-pay

## 2016-03-08 ENCOUNTER — Ambulatory Visit: Admission: RE | Admit: 2016-03-08 | Payer: Medicare Other | Source: Ambulatory Visit

## 2016-03-08 DIAGNOSIS — B182 Chronic viral hepatitis C: Secondary | ICD-10-CM

## 2016-03-16 ENCOUNTER — Ambulatory Visit
Admission: RE | Admit: 2016-03-16 | Discharge: 2016-03-16 | Disposition: A | Discharge status: Home / Self Care | Payer: Medicare Other | Source: Ambulatory Visit | Attending: Gastroenterology | Admitting: Gastroenterology

## 2016-03-16 DIAGNOSIS — N200 Calculus of kidney: Secondary | ICD-10-CM | POA: Insufficient documentation

## 2016-03-16 DIAGNOSIS — B182 Chronic viral hepatitis C: Secondary | ICD-10-CM | POA: Diagnosis not present

## 2016-03-16 DIAGNOSIS — R932 Abnormal findings on diagnostic imaging of liver and biliary tract: Secondary | ICD-10-CM | POA: Insufficient documentation

## 2016-03-16 LAB — HCV RT-PCR, QUANT (NON-GRAPH)

## 2016-03-16 LAB — HEPATIC FUNCTION PANEL
ALBUMIN: 4.7 g/dL (ref 3.5–5.5)
ALK PHOS: 63 IU/L (ref 39–117)
ALT: 6 IU/L (ref 0–44)
AST: 17 IU/L (ref 0–40)
BILIRUBIN TOTAL: 0.5 mg/dL (ref 0.0–1.2)
Bilirubin, Direct: 0.12 mg/dL (ref 0.00–0.40)
Total Protein: 7.3 g/dL (ref 6.0–8.5)

## 2016-03-16 LAB — CBC WITH DIFFERENTIAL/PLATELET
BASOS ABS: 0 10*3/uL (ref 0.0–0.2)
Basos: 1 %
EOS (ABSOLUTE): 0.2 10*3/uL (ref 0.0–0.4)
EOS: 2 %
HEMATOCRIT: 41.3 % (ref 37.5–51.0)
HEMOGLOBIN: 13.6 g/dL (ref 12.6–17.7)
IMMATURE GRANULOCYTES: 0 %
Immature Grans (Abs): 0 10*3/uL (ref 0.0–0.1)
LYMPHS: 24 %
Lymphocytes Absolute: 2 10*3/uL (ref 0.7–3.1)
MCH: 26.6 pg (ref 26.6–33.0)
MCHC: 32.9 g/dL (ref 31.5–35.7)
MCV: 81 fL (ref 79–97)
MONOCYTES: 9 %
Monocytes Absolute: 0.7 10*3/uL (ref 0.1–0.9)
NEUTROS PCT: 64 %
Neutrophils Absolute: 5.4 10*3/uL (ref 1.4–7.0)
Platelets: 248 10*3/uL (ref 150–379)
RBC: 5.11 x10E6/uL (ref 4.14–5.80)
RDW: 15.7 % — ABNORMAL HIGH (ref 12.3–15.4)
WBC: 8.3 10*3/uL (ref 3.4–10.8)

## 2016-03-16 LAB — HEPATITIS B SURFACE ANTIGEN: Hepatitis B Surface Ag: NEGATIVE

## 2016-03-16 LAB — PROTIME-INR
INR: 0.9 (ref 0.8–1.2)
Prothrombin Time: 10 s (ref 9.1–12.0)

## 2016-03-16 LAB — HEPATITIS B SURFACE ANTIBODY,QUALITATIVE: Hep B Surface Ab, Qual: NONREACTIVE

## 2016-03-16 LAB — HEPATITIS C GENOTYPE

## 2016-03-16 LAB — HEPATITIS A ANTIBODY, TOTAL: Hep A Total Ab: NEGATIVE

## 2016-03-16 LAB — CERULOPLASMIN: Ceruloplasmin: 27.3 mg/dL (ref 16.0–31.0)

## 2016-03-16 LAB — ALPHA-1-ANTITRYPSIN: A1 ANTITRYPSIN: 160 mg/dL (ref 90–200)

## 2016-03-16 LAB — ANTI-SMOOTH MUSCLE ANTIBODY, IGG: Smooth Muscle Ab: 13 Units (ref 0–19)

## 2016-03-16 LAB — ANA: ANA: NEGATIVE

## 2016-03-16 LAB — MITOCHONDRIAL ANTIBODIES: Mitochondrial Ab: 7.7 Units (ref 0.0–20.0)

## 2016-03-19 ENCOUNTER — Telehealth: Payer: Self-pay

## 2016-03-19 NOTE — Telephone Encounter (Signed)
-----   Message from Lucilla Lame, MD sent at 03/17/2016  8:04 AM EDT ----- Please fax a copy of this to his PCP so they can address his kidney stone.

## 2016-03-19 NOTE — Telephone Encounter (Signed)
Pt notified of [place] results and Kidney stone. Will fax a copy to PCP to address.

## 2016-03-30 ENCOUNTER — Telehealth: Payer: Self-pay | Admitting: Gastroenterology

## 2016-03-30 NOTE — Telephone Encounter (Signed)
People from [facility] told him he has a co pay and it's very expensive

## 2016-03-31 NOTE — Telephone Encounter (Signed)
Advised pt the specialty pharmacy will work on getting a grant to help with paying the high copay. Will update pt once this gets approved.

## 2016-04-05 ENCOUNTER — Encounter: Payer: Self-pay | Admitting: Gastroenterology

## 2016-04-08 NOTE — Telephone Encounter (Signed)
error 

## 2016-07-20 ENCOUNTER — Telehealth: Payer: Self-pay | Admitting: Gastroenterology

## 2016-07-20 NOTE — Telephone Encounter (Signed)
Patient called and thinks he's not getting enough of the Harv[place]. Can you check for him?

## 2016-07-22 NOTE — Telephone Encounter (Signed)
Pt has been scheduled for a follow up appt due to finishing Hep C.

## 2016-08-03 ENCOUNTER — Ambulatory Visit: Payer: Medicare Other | Admitting: Gastroenterology

## 2016-08-09 ENCOUNTER — Ambulatory Visit (INDEPENDENT_AMBULATORY_CARE_PROVIDER_SITE_OTHER): Payer: Medicare Other | Admitting: Gastroenterology

## 2016-08-09 ENCOUNTER — Other Ambulatory Visit: Payer: Self-pay

## 2016-08-09 ENCOUNTER — Encounter: Payer: Self-pay | Admitting: Gastroenterology

## 2016-08-09 ENCOUNTER — Ambulatory Visit: Payer: Medicare Other | Admitting: Gastroenterology

## 2016-08-09 VITALS — BP 131/65 | HR 54 | Temp 97.8°F | Ht 67.0 in | Wt 154.0 lb

## 2016-08-09 DIAGNOSIS — B182 Chronic viral hepatitis C: Secondary | ICD-10-CM

## 2016-08-09 DIAGNOSIS — B1920 Unspecified viral hepatitis C without hepatic coma: Secondary | ICD-10-CM | POA: Diagnosis not present

## 2016-08-09 NOTE — Progress Notes (Signed)
   Primary Care Physician: Perrin Maltese, MD  Primary Gastroenterologist:  Dr. Lucilla Lame  No chief complaint on file.   HPI: [name] is a 61 y.o. male here for follow-up after completing hepatitis C treatment. The patient states he had no side effects from the treatment. He also reports that he does not feel any different now than he did when he was taking treatment or before he started the treatment.  Current Outpatient Prescriptions  Medication Sig Dispense Refill  . ALPRAZolam (XANAX) 1 MG tablet Take 1 mg by mouth 3 (three) times daily.  5  . ALPRAZolam (XANAX) 1 MG tablet     . aspirin (GOODSENSE ASPIRIN) 325 MG tablet Take 325 mg by mouth.    . carvedilol (COREG) 25 MG tablet Take by mouth.    . furosemide (LASIX) 20 MG tablet Take 20 mg by mouth.    . oxyCODONE-acetaminophen (PERCOCET) 10-325 MG tablet Take by mouth.    . sacubitril-valsartan (ENTRESTO) 97-103 MG Take 1 tablet by mouth 2 (two) times daily.    . simvastatin (ZOCOR) 40 MG tablet      No current facility-administered medications for this visit.     Allergies as of 08/09/2016 - never reviewed  Allergen Reaction Noted  . Codeine Hives 04/16/2014    ROS:  General: Negative for anorexia, weight loss, fever, chills, fatigue, weakness. ENT: Negative for hoarseness, difficulty swallowing , nasal congestion. CV: Negative for chest pain, angina, palpitations, dyspnea on exertion, peripheral edema.  Respiratory: Negative for dyspnea at rest, dyspnea on exertion, cough, sputum, wheezing.  GI: See history of present illness. GU:  Negative for dysuria, hematuria, urinary incontinence, urinary frequency, nocturnal urination.  Endo: Negative for unusual weight change.    Physical Examination:   There were no vitals taken for this visit.  General: Well-nourished, well-developed in no acute distress.  Eyes: No icterus. Conjunctivae pink. Extremities: No lower extremity edema. No clubbing or  deformities. Neuro: Alert and oriented x 3.  Grossly intact. Skin: Warm and dry, no jaundice.   Psych: Alert and cooperative, normal mood and affect.  Labs:    Imaging Studies: No results found.  Assessment and Plan:   [name] is a 61 y.o. y/o male who has completed his treatment for hepatitis C. The patient will have his viral load checked now and his liver enzymes checked. The patient will also have repeat labs checked in 6 months and a year. The patient has been explained the plan and agrees with it.    Lucilla Lame, MD. [name]   Note: This dictation was prepared with Dragon dictation along with smaller phrase technology. Any transcriptional errors that result from this process are unintentional.

## 2016-08-24 LAB — HEPATIC FUNCTION PANEL
ALK PHOS: 55 IU/L (ref 39–117)
ALT: 7 IU/L (ref 0–44)
AST: 15 IU/L (ref 0–40)
Albumin: 4.4 g/dL (ref 3.5–5.5)
BILIRUBIN TOTAL: 0.5 mg/dL (ref 0.0–1.2)
BILIRUBIN, DIRECT: 0.15 mg/dL (ref 0.00–0.40)
TOTAL PROTEIN: 6.5 g/dL (ref 6.0–8.5)

## 2016-08-24 LAB — HCV RT-PCR, QUANT (NON-GRAPH)

## 2016-08-27 ENCOUNTER — Telehealth: Payer: Self-pay

## 2016-08-27 NOTE — Telephone Encounter (Signed)
Pt notified of lab results

## 2016-08-27 NOTE — Telephone Encounter (Signed)
-----   Message from Lucilla Lame, MD sent at 08/27/2016  1:47 PM EST ----- Let the patient know the viral load was negative and his LFT's are normal.

## 2017-01-20 ENCOUNTER — Telehealth: Payer: Self-pay | Admitting: Gastroenterology

## 2017-01-20 NOTE — Telephone Encounter (Signed)
Patient had blood work done at [facility] yesterday and they told him the Hep B was positive. Please call today.

## 2017-01-20 NOTE — Telephone Encounter (Signed)
Advised pt to contact [facility] and have recent labs faxed to our office for review.

## 2017-01-24 ENCOUNTER — Telehealth: Payer: Self-pay

## 2017-01-24 NOTE — Telephone Encounter (Signed)
-----   Message from Lucilla Lame, MD sent at 01/24/2017  4:20 PM EDT ----- Let the patient know that he does not have hepatitis B.

## 2017-01-24 NOTE — Telephone Encounter (Signed)
Left vm letting pt know he doesn't have Hep B.

## 2017-02-04 ENCOUNTER — Telehealth: Payer: Self-pay | Admitting: Gastroenterology

## 2017-02-04 NOTE — Telephone Encounter (Signed)
Patient left a voice message that he would like for you to call him. No reason given. °

## 2017-02-04 NOTE — Telephone Encounter (Signed)
Pt is upset because Dr. Dina Rich from [facility] continues to tell pt he has Hep B. You have reviewed his recent labs from their office and advised me to tell him he doesnot have Hep B. He would like you to contact her and explain this as he is very worried and upset.

## 2017-02-15 NOTE — Telephone Encounter (Signed)
I have discussed this with his doctor. He does NOT have hep B. He has been exposed to it in the past but has resolved.

## 2017-03-09 ENCOUNTER — Ambulatory Visit (INDEPENDENT_AMBULATORY_CARE_PROVIDER_SITE_OTHER): Payer: Medicare Other | Admitting: Gastroenterology

## 2017-03-09 ENCOUNTER — Other Ambulatory Visit: Payer: Self-pay

## 2017-03-09 ENCOUNTER — Encounter: Payer: Self-pay | Admitting: Gastroenterology

## 2017-03-09 ENCOUNTER — Other Ambulatory Visit
Admission: RE | Admit: 2017-03-09 | Discharge: 2017-03-09 | Disposition: A | Discharge status: Home / Self Care | Payer: Medicare Other | Source: Ambulatory Visit | Attending: Gastroenterology | Admitting: Gastroenterology

## 2017-03-09 VITALS — BP 116/60 | HR 59 | Temp 98.3°F | Ht 67.0 in | Wt 151.0 lb

## 2017-03-09 DIAGNOSIS — R634 Abnormal weight loss: Secondary | ICD-10-CM | POA: Diagnosis present

## 2017-03-09 LAB — CBC WITH DIFFERENTIAL/PLATELET
BASOS ABS: 0.1 10*3/uL (ref 0–0.1)
Basophils Relative: 1 %
EOS ABS: 0.4 10*3/uL (ref 0–0.7)
EOS PCT: 4 %
HCT: 41.3 % (ref 40.0–52.0)
Hemoglobin: 13.3 g/dL (ref 13.0–18.0)
Lymphocytes Relative: 17 %
Lymphs Abs: 1.7 10*3/uL (ref 1.0–3.6)
MCH: 26.7 pg (ref 26.0–34.0)
MCHC: 32.3 g/dL (ref 32.0–36.0)
MCV: 82.8 fL (ref 80.0–100.0)
MONO ABS: 0.8 10*3/uL (ref 0.2–1.0)
Monocytes Relative: 8 %
Neutro Abs: 7.4 10*3/uL — ABNORMAL HIGH (ref 1.4–6.5)
Neutrophils Relative %: 70 %
PLATELETS: 225 10*3/uL (ref 150–440)
RBC: 4.99 MIL/uL (ref 4.40–5.90)
RDW: 15.5 % — AB (ref 11.5–14.5)
WBC: 10.3 10*3/uL (ref 3.8–10.6)

## 2017-03-09 NOTE — Patient Instructions (Signed)
You are scheduled for a RUQ abdominal US at [facility] on Tuesday, Sept 4th @ 9:00am. Please arrive at the Medical mall at 8:45am. You cannot have anything to eat or drink after midnight Monday night.  If you need to reschedule this appointment for any reason, please contact central scheduling at [phone].  You will need the following immunizations:  Hepatitis A and Hepatitis B

## 2017-03-09 NOTE — Progress Notes (Signed)
Primary Care Physician: Perrin Maltese, MD  Primary Gastroenterologist:  Dr. Lucilla Lame  No chief complaint on file.   HPI: [name] is a 61 y.o. male here for weight loss. The patient has seen me in the past because of hepatitis C and was treated. The patient was 156 pounds back in August 2017. Today he is 151 lbs. the patient reports that although he is not losing a lot of weight he feels like his muscle mass is decreasing and his rings are falling off his hands. 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 The patient does not report a recent EGD or colonoscopy. He does report that he had a small bowel obstruction and colon resection.  Current Outpatient Prescriptions  Medication Sig Dispense Refill  . ALPRAZolam (XANAX) 1 MG tablet Take 1 mg by mouth 3 (three) times daily.  5  . ALPRAZolam (XANAX) 1 MG tablet     . aspirin (GOODSENSE ASPIRIN) 325 MG tablet Take 325 mg by mouth.    . carvedilol (COREG) 25 MG tablet Take by mouth.    . enalapril (VASOTEC) 10 MG tablet Take by mouth.    . fluticasone (FLONASE) 50 MCG/ACT nasal spray     . furosemide (LASIX) 20 MG tablet Take 20 mg by mouth.    . levofloxacin (LEVAQUIN) 750 MG tablet     . meloxicam (MOBIC) 15 MG tablet Take by mouth.    . oxyCODONE-acetaminophen (PERCOCET) 10-325 MG tablet Take by mouth.    . predniSONE (DELTASONE) 20 MG tablet     . sacubitril-valsartan (ENTRESTO) 97-103 MG Take 1 tablet by mouth 2 (two) times daily.    . simvastatin (ZOCOR) 40 MG tablet     . spironolactone (ALDACTONE) 25 MG tablet Take by mouth.     No current facility-administered medications for this visit.     Allergies as of 03/09/2017 - Review Complete 08/09/2016  Allergen Reaction Noted  . Codeine Hives 04/16/2014    ROS:  General: Negative for anorexia, weight loss, fever, chills,  fatigue, weakness. ENT: Negative for hoarseness, difficulty swallowing , nasal congestion. CV: Negative for chest pain, angina, palpitations, dyspnea on exertion, peripheral edema.  Respiratory: Negative for dyspnea at rest, dyspnea on exertion, cough, sputum, wheezing.  GI: See history of present illness. GU:  Negative for dysuria, hematuria, urinary incontinence, urinary frequency, nocturnal urination.  Endo: Negative for unusual weight change.    Physical Examination:   There were no vitals taken for this visit.  General: Well-nourished, well-developed in no acute distress.  Eyes: No icterus. Conjunctivae pink. Mouth: Oropharyngeal mucosa moist and pink , no lesions erythema or exudate. Lungs: Clear to auscultation bilaterally. Non-labored. Heart: Regular rate and rhythm, no murmurs rubs or gallops.  Abdomen: Bowel sounds are normal, nontender, nondistended, no hepatosplenomegaly or masses, no abdominal bruits or hernia , no rebound or guarding.   Extremities: No lower extremity edema. No clubbing or deformities. Neuro: Alert and oriented x 3.  Grossly intact. Skin: Warm and dry, no jaundice.   Psych: Alert and cooperative, normal mood and affect.  Labs:    Imaging Studies: No results found.  Assessment and Plan:   [name] is a 61 y.o. y/o male who comes in today with weight loss. The patient has been treated for hepatitis C in the past and  has been a responder. The patient's hepatitis B surface antigen  Has been negative and we have reviewed his results. The patien will be set up for an EGD and colonoscopy due to his weight loss. The patient will also be set up for a right upper quadrant ultrasound because of his history of hepatitis C and nodularity. His blood will be sent off for alpha-fetoprotein and CBC. I have discussed risks & benefits which include, but are not limited to, bleeding, infection, perforation & drug reaction.  The patient agrees with this plan & written  consent will be obtained.       Lucilla Lame, MD. [name]   Note: This dictation was prepared with Dragon dictation along with smaller phrase technology. Any transcriptional errors that result from this process are unintentional.

## 2017-03-10 LAB — AFP TUMOR MARKER: AFP, SERUM, TUMOR MARKER: 3.4 ng/mL (ref 0.0–8.3)

## 2017-03-15 ENCOUNTER — Ambulatory Visit
Admission: RE | Admit: 2017-03-15 | Discharge: 2017-03-15 | Disposition: A | Discharge status: Home / Self Care | Payer: Medicare Other | Source: Ambulatory Visit | Attending: Gastroenterology | Admitting: Gastroenterology

## 2017-03-15 DIAGNOSIS — Z9049 Acquired absence of other specified parts of digestive tract: Secondary | ICD-10-CM | POA: Diagnosis not present

## 2017-03-15 DIAGNOSIS — R634 Abnormal weight loss: Secondary | ICD-10-CM | POA: Insufficient documentation

## 2017-03-16 ENCOUNTER — Telehealth: Payer: Self-pay

## 2017-03-16 NOTE — Telephone Encounter (Signed)
-----   Message from Lucilla Lame, MD sent at 03/10/2017  1:01 PM EDT ----- Let the patient know that his labs are good

## 2017-03-16 NOTE — Telephone Encounter (Signed)
-----   Message from Lucilla Lame, MD sent at 03/16/2017  8:49 AM EDT ----- Let the patient know that the ultrasound did not show any abnormalities except for a right kidney cyst and right kidney stone.

## 2017-03-16 NOTE — Telephone Encounter (Signed)
Pt notified of ultrasound and lab results.

## 2017-03-23 ENCOUNTER — Telehealth: Payer: Self-pay | Admitting: Gastroenterology

## 2017-03-23 ENCOUNTER — Encounter: Payer: Self-pay | Admitting: Anesthesiology

## 2017-03-23 ENCOUNTER — Other Ambulatory Visit: Payer: Self-pay

## 2017-03-23 ENCOUNTER — Encounter: Payer: Self-pay | Admitting: *Deleted

## 2017-03-23 DIAGNOSIS — R634 Abnormal weight loss: Secondary | ICD-10-CM

## 2017-03-23 NOTE — Telephone Encounter (Signed)
Contacted pt and advised his procedure is scheduled for Monday. Made sure pt had his prep and instructions.

## 2017-03-23 NOTE — Telephone Encounter (Signed)
Patient thinks he has a colonoscopy Monday but he isn't on the schedule. Please call him ASAP.

## 2017-03-24 ENCOUNTER — Other Ambulatory Visit: Payer: Self-pay

## 2017-03-24 DIAGNOSIS — R634 Abnormal weight loss: Secondary | ICD-10-CM

## 2017-03-28 ENCOUNTER — Ambulatory Visit: Admission: RE | Admit: 2017-03-28 | Payer: Medicare Other | Source: Ambulatory Visit | Admitting: Gastroenterology

## 2017-03-28 HISTORY — DX: Unspecified osteoarthritis, unspecified site: M19.90

## 2017-03-28 HISTORY — DX: Dorsalgia, unspecified: M54.9

## 2017-03-28 HISTORY — DX: Other chronic pain: G89.29

## 2017-03-28 HISTORY — DX: Calculus of kidney: N20.0

## 2017-03-28 HISTORY — DX: Cardiomyopathy, unspecified: I42.9

## 2017-03-28 HISTORY — DX: Heart failure, unspecified: I50.9

## 2017-03-28 SURGERY — COLONOSCOPY WITH PROPOFOL
Anesthesia: Choice

## 2017-03-29 ENCOUNTER — Ambulatory Visit
Admission: RE | Admit: 2017-03-29 | Discharge: 2017-03-29 | Disposition: A | Discharge status: Home / Self Care | Payer: Medicare Other | Source: Ambulatory Visit | Attending: Gastroenterology | Admitting: Gastroenterology

## 2017-03-29 ENCOUNTER — Encounter: Payer: Self-pay | Admitting: Anesthesiology

## 2017-03-29 ENCOUNTER — Encounter
Admission: RE | Disposition: A | Discharge status: Home / Self Care | Payer: Self-pay | Source: Ambulatory Visit | Attending: Gastroenterology

## 2017-03-29 ENCOUNTER — Ambulatory Visit: Payer: Medicare Other | Admitting: Anesthesiology

## 2017-03-29 DIAGNOSIS — I110 Hypertensive heart disease with heart failure: Secondary | ICD-10-CM | POA: Insufficient documentation

## 2017-03-29 DIAGNOSIS — D125 Benign neoplasm of sigmoid colon: Secondary | ICD-10-CM | POA: Diagnosis not present

## 2017-03-29 DIAGNOSIS — K635 Polyp of colon: Secondary | ICD-10-CM | POA: Diagnosis not present

## 2017-03-29 DIAGNOSIS — R634 Abnormal weight loss: Secondary | ICD-10-CM | POA: Diagnosis not present

## 2017-03-29 DIAGNOSIS — Z79899 Other long term (current) drug therapy: Secondary | ICD-10-CM | POA: Insufficient documentation

## 2017-03-29 DIAGNOSIS — Z980 Intestinal bypass and anastomosis status: Secondary | ICD-10-CM | POA: Diagnosis not present

## 2017-03-29 DIAGNOSIS — Z7982 Long term (current) use of aspirin: Secondary | ICD-10-CM | POA: Diagnosis not present

## 2017-03-29 DIAGNOSIS — K3189 Other diseases of stomach and duodenum: Secondary | ICD-10-CM | POA: Insufficient documentation

## 2017-03-29 DIAGNOSIS — Z87442 Personal history of urinary calculi: Secondary | ICD-10-CM | POA: Diagnosis not present

## 2017-03-29 DIAGNOSIS — I509 Heart failure, unspecified: Secondary | ICD-10-CM | POA: Insufficient documentation

## 2017-03-29 DIAGNOSIS — G8929 Other chronic pain: Secondary | ICD-10-CM | POA: Insufficient documentation

## 2017-03-29 DIAGNOSIS — K2970 Gastritis, unspecified, without bleeding: Secondary | ICD-10-CM | POA: Diagnosis not present

## 2017-03-29 DIAGNOSIS — I2510 Atherosclerotic heart disease of native coronary artery without angina pectoris: Secondary | ICD-10-CM | POA: Insufficient documentation

## 2017-03-29 DIAGNOSIS — K2950 Unspecified chronic gastritis without bleeding: Secondary | ICD-10-CM | POA: Insufficient documentation

## 2017-03-29 DIAGNOSIS — Z87891 Personal history of nicotine dependence: Secondary | ICD-10-CM | POA: Diagnosis not present

## 2017-03-29 DIAGNOSIS — B1920 Unspecified viral hepatitis C without hepatic coma: Secondary | ICD-10-CM | POA: Insufficient documentation

## 2017-03-29 DIAGNOSIS — M549 Dorsalgia, unspecified: Secondary | ICD-10-CM | POA: Diagnosis not present

## 2017-03-29 HISTORY — PX: COLONOSCOPY WITH PROPOFOL: SHX5780

## 2017-03-29 HISTORY — PX: ESOPHAGOGASTRODUODENOSCOPY (EGD) WITH PROPOFOL: SHX5813

## 2017-03-29 HISTORY — DX: Atherosclerotic heart disease of native coronary artery without angina pectoris: I25.10

## 2017-03-29 SURGERY — COLONOSCOPY WITH PROPOFOL
Anesthesia: General

## 2017-03-29 MED ORDER — FENTANYL CITRATE (PF) 100 MCG/2ML IJ SOLN
INTRAMUSCULAR | Status: DC | PRN
Start: 2017-03-29 — End: 2017-03-29
  Administered 2017-03-29 (×2): 50 ug via INTRAVENOUS

## 2017-03-29 MED ORDER — MIDAZOLAM HCL 2 MG/2ML IJ SOLN
INTRAMUSCULAR | Status: AC
  Filled 2017-03-29: qty 2

## 2017-03-29 MED ORDER — GLYCOPYRROLATE 0.2 MG/ML IJ SOLN
INTRAMUSCULAR | Status: DC | PRN
  Administered 2017-03-29: 0.1 mg via INTRAVENOUS

## 2017-03-29 MED ORDER — EPHEDRINE SULFATE 50 MG/ML IJ SOLN
INTRAMUSCULAR | Status: DC | PRN
  Administered 2017-03-29 (×2): 10 mg via INTRAVENOUS

## 2017-03-29 MED ORDER — PROPOFOL 500 MG/50ML IV EMUL
INTRAVENOUS | Status: DC | PRN
  Administered 2017-03-29: 100 ug/kg/min via INTRAVENOUS

## 2017-03-29 MED ORDER — GLYCOPYRROLATE 0.2 MG/ML IJ SOLN
INTRAMUSCULAR | Status: AC
  Filled 2017-03-29: qty 1

## 2017-03-29 MED ORDER — EPHEDRINE SULFATE 50 MG/ML IJ SOLN
INTRAMUSCULAR | Status: AC
  Filled 2017-03-29: qty 1

## 2017-03-29 MED ORDER — FENTANYL CITRATE (PF) 100 MCG/2ML IJ SOLN
INTRAMUSCULAR | Status: AC
  Filled 2017-03-29: qty 2

## 2017-03-29 MED ORDER — MIDAZOLAM HCL 2 MG/2ML IJ SOLN
INTRAMUSCULAR | Status: DC | PRN
  Administered 2017-03-29 (×2): 1 mg via INTRAVENOUS

## 2017-03-29 MED ORDER — LIDOCAINE HCL (CARDIAC) 20 MG/ML IV SOLN
INTRAVENOUS | Status: DC | PRN
  Administered 2017-03-29: 30 mg via INTRAVENOUS

## 2017-03-29 MED ORDER — PROPOFOL 500 MG/50ML IV EMUL
INTRAVENOUS | Status: AC
  Filled 2017-03-29: qty 50

## 2017-03-29 MED ORDER — LIDOCAINE HCL (PF) 2 % IJ SOLN
INTRAMUSCULAR | Status: AC
  Filled 2017-03-29: qty 2

## 2017-03-29 MED ORDER — SODIUM CHLORIDE 0.9 % IV SOLN
INTRAVENOUS | Status: DC
  Administered 2017-03-29: 11:00:00 via INTRAVENOUS

## 2017-03-29 NOTE — Anesthesia Post-op Follow-up Note (Signed)
Anesthesia QCDR form completed.        

## 2017-03-29 NOTE — Op Note (Signed)
[facility] Patient Name: [name] Procedure Date: 03/29/2017 11:35 AM MRN: [id number] Account #: [id number] Date of Birth: Jun 19, 1956 Admit Type: Outpatient Age: 61 Room: [facility] ENDO ROOM 4 Gender: Male Note Status: Finalized Procedure:            Colonoscopy Indications:          Weight loss Providers:            Lucilla Lame MD, MD Referring MD:         Perrin Maltese, MD (Referring MD) Medicines:            Propofol per Anesthesia Complications:        No immediate complications. Procedure:            Pre-Anesthesia Assessment:                       - Prior to the procedure, a History and Physical was                        performed, and patient medications and allergies were                        reviewed. The patient's tolerance of previous                        anesthesia was also reviewed. The risks and benefits of                        the procedure and the sedation options and risks were                        discussed with the patient. All questions were                        answered, and informed consent was obtained. Prior                        Anticoagulants: The patient has taken no previous                        anticoagulant or antiplatelet agents. ASA Grade                        Assessment: II - A patient with mild systemic disease.                        After reviewing the risks and benefits, the patient was                        deemed in satisfactory condition to undergo the                        procedure.                       After obtaining informed consent, the colonoscope was                        passed under direct vision. Throughout the procedure,  the patient's blood pressure, pulse, and oxygen                        saturations were monitored continuously. The                        Colonoscope was introduced through the anus and                        advanced to the the  terminal ileum. The colonoscopy was                        performed without difficulty. The patient tolerated the                        procedure well. The quality of the bowel preparation                        was excellent. Findings:      The perianal and digital rectal examinations were normal.      There was evidence of a prior end-to-end colo-colonic anastomosis in the       sigmoid colon. This was patent.      Two sessile polyps were found in the sigmoid colon. The polyps were 3 to       4 mm in size. These polyps were removed with a cold biopsy forceps.       Resection and retrieval were complete.      The terminal ileum appeared normal. Impression:           - Patent end-to-end colo-colonic anastomosis.                       - Two 3 to 4 mm polyps in the sigmoid colon, removed                        with a cold biopsy forceps. Resected and retrieved.                       - The examined portion of the ileum was normal. Recommendation:       - Discharge patient to home.                       - Resume previous diet.                       - Continue present medications.                       - Await pathology results.                       - Repeat colonoscopy in 5 years for surveillance. Procedure Code(s):    --- Professional ---                       [phone], Colonoscopy, flexible; with biopsy, single or                        multiple Diagnosis Code(s):    --- Professional ---  R63.4, Abnormal weight loss                       Z98.0, Intestinal bypass and anastomosis status                       D12.5, Benign neoplasm of sigmoid colon CPT copyright 2016 American Medical Association. All rights reserved. The codes documented in this report are preliminary and upon coder review may  be revised to meet current compliance requirements. Lucilla Lame MD, MD 03/29/2017 11:59:36 AM This report has been signed electronically. Number of Addenda: 0 Note Initiated  On: 03/29/2017 11:35 AM Scope Withdrawal Time: 0 hours 7 minutes 31 seconds  Total Procedure Duration: 0 hours 8 minutes 23 seconds       [facility]

## 2017-03-29 NOTE — Transfer of Care (Signed)
Immediate Anesthesia Transfer of Care Note  Patient: [name]  Procedure(s) Performed: Procedure(s): COLONOSCOPY WITH PROPOFOL (N/A) ESOPHAGOGASTRODUODENOSCOPY (EGD) WITH PROPOFOL (N/A)  Patient Location: PACU  Anesthesia Type:General  Level of Consciousness: awake and sedated  Airway & Oxygen Therapy: Patient Spontanous Breathing and Patient connected to nasal cannula oxygen  Post-op Assessment: Report given to RN and Post -op Vital signs reviewed and stable  Post vital signs: Reviewed and stable  Last Vitals:  Vitals:   03/29/17 1018  BP: (!) 115/52  Pulse: 76  Resp: 18  Temp: (!) 35.2 C  SpO2: 99%    Last Pain:  Vitals:   03/29/17 1018  TempSrc: Tympanic         Complications: No apparent anesthesia complications

## 2017-03-29 NOTE — Anesthesia Postprocedure Evaluation (Signed)
Anesthesia Post Note  Patient: [name]  Procedure(s) Performed: Procedure(s) (LRB): COLONOSCOPY WITH PROPOFOL (N/A) ESOPHAGOGASTRODUODENOSCOPY (EGD) WITH PROPOFOL (N/A)  Patient location during evaluation: PACU Anesthesia Type: General Level of consciousness: awake and alert and oriented Pain management: pain level controlled Vital Signs Assessment: post-procedure vital signs reviewed and stable Respiratory status: spontaneous breathing Cardiovascular status: blood pressure returned to baseline Anesthetic complications: no     Last Vitals:  Vitals:   03/29/17 1018 03/29/17 1202  BP: (!) 115/52 134/65  Pulse: 76   Resp: 18   Temp: (!) 35.2 C (!) 36.3 C  SpO2: 99%     Last Pain:  Vitals:   03/29/17 1202  TempSrc: Tympanic                 Holland Nickson

## 2017-03-29 NOTE — Anesthesia Procedure Notes (Signed)
Performed by: Allean Found Pre-anesthesia Checklist: Patient identified, Emergency Drugs available, Suction available, Patient being monitored and Timeout performed Patient Re-evaluated:Patient Re-evaluated prior to induction Oxygen Delivery Method: Nasal cannula Preoxygenation: Pre-oxygenation with 100% oxygen Induction Type: IV induction Airway Equipment and Method: Bite block Placement Confirmation: positive ETCO2 and CO2 detector

## 2017-03-29 NOTE — Op Note (Signed)
[facility] Patient Name: [name] Procedure Date: 03/29/2017 11:36 AM MRN: [id number] Account #: [id number] Date of Birth: [birth year] Admit Type: Outpatient Age: 61 Room: [facility] ENDO ROOM 4 Gender: Male Note Status: Finalized Procedure:            Upper GI endoscopy Indications:          Weight loss Providers:            Lucilla Lame MD, MD Referring MD:         Perrin Maltese, MD (Referring MD) Medicines:            Propofol per Anesthesia Complications:        No immediate complications. Procedure:            Pre-Anesthesia Assessment:                       - Prior to the procedure, a History and Physical was                        performed, and patient medications and allergies were                        reviewed. The patient's tolerance of previous                        anesthesia was also reviewed. The risks and benefits of                        the procedure and the sedation options and risks were                        discussed with the patient. All questions were                        answered, and informed consent was obtained. Prior                        Anticoagulants: The patient has taken no previous                        anticoagulant or antiplatelet agents. ASA Grade                        Assessment: II - A patient with mild systemic disease.                        After reviewing the risks and benefits, the patient was                        deemed in satisfactory condition to undergo the                        procedure.                       After obtaining informed consent, the endoscope was                        passed under direct vision. Throughout the procedure,  the patient's blood pressure, pulse, and oxygen                        saturations were monitored continuously. The Endoscope                        was introduced through the mouth, and advanced to the   second part of duodenum. The upper GI endoscopy was                        accomplished without difficulty. The patient tolerated                        the procedure well. Findings:      The examined esophagus was normal.      Diffuse moderate inflammation characterized by adherent blood and       erythema was found in the gastric body and in the gastric antrum.       Biopsies were taken with a cold forceps for histology.      The examined duodenum was normal. Impression:           - Normal esophagus.                       - Gastritis. Biopsied.                       - Normal examined duodenum. Recommendation:       - Discharge patient to home.                       - Resume previous diet.                       - Continue present medications.                       - Await pathology results. Procedure Code(s):    --- Professional ---                       [phone], Esophagogastroduodenoscopy, flexible, transoral;                        with biopsy, single or multiple Diagnosis Code(s):    --- Professional ---                       R63.4, Abnormal weight loss                       K29.70, Gastritis, unspecified, without bleeding CPT copyright 2016 American Medical Association. All rights reserved. The codes documented in this report are preliminary and upon coder review may  be revised to meet current compliance requirements. Lucilla Lame MD, MD 03/29/2017 11:46:06 AM This report has been signed electronically. Number of Addenda: 0 Note Initiated On: 03/29/2017 11:36 AM      [facility]

## 2017-03-29 NOTE — H&P (Signed)
Lucilla Lame, MD [address] [place], [place] [address] [phone] Fax : [phone]  Primary Care Physician:  Perrin Maltese, MD Primary Gastroenterologist:  Dr. Allen Norris  Pre-Procedure History & Physical: HPI:  [name] is a 61 y.o. male is here for an endoscopy and colonoscopy.   Past Medical History:  Diagnosis Date  . Arthritis    knees, lower back  . Cardiomyopathy ([place])   . CHF (congestive heart failure) ([place])   . Chronic back pain   . Coronary artery disease   . Hepatitis C virus infection without hepatic coma    received treatment. clear now  . Hypertension   . Kidney stones     Past Surgical History:  Procedure Laterality Date  . BACK SURGERY    . CARDIAC CATHETERIZATION    . CHOLECYSTECTOMY    . HERNIA REPAIR    . SPINAL FUSION      Prior to Admission medications   Medication Sig Start Date End Date Taking? Authorizing Provider  ALPRAZolam Duanne Moron) 1 MG tablet Take 1 mg by mouth 3 (three) times daily. 01/06/16  Yes [provider]  aspirin (GOODSENSE ASPIRIN) 325 MG tablet Take 325 mg by mouth.   Yes [provider]  carvedilol (COREG) 25 MG tablet Take by mouth.   Yes [provider]  enalapril (VASOTEC) 10 MG tablet Take by mouth.   Yes [provider]  furosemide (LASIX) 20 MG tablet Take 20 mg by mouth.   Yes [provider]  meloxicam (MOBIC) 15 MG tablet Take by mouth.   Yes [provider]  oxyCODONE-acetaminophen (PERCOCET) 10-325 MG tablet Take by mouth.   Yes [provider]  sacubitril-valsartan (ENTRESTO) 97-103 MG Take 1 tablet by mouth 2 (two) times daily.   Yes [provider]  simvastatin (ZOCOR) 40 MG tablet  07/24/16  Yes [provider]  ALPRAZolam Duanne Moron) 1 MG tablet  04/01/14   [provider]  fluticasone Asencion Islam) 50 MCG/ACT nasal spray  01/20/17   [provider]  predniSONE (DELTASONE) 20 MG tablet  01/11/17    [provider]  spironolactone (ALDACTONE) 25 MG tablet Take by mouth.    [provider]    Allergies as of 03/24/2017 - Review Complete 03/09/2017  Allergen Reaction Noted  . Codeine Hives and Itching 04/16/2014    Family History  Problem Relation Age of Onset  . COPD Mother     Social History   Social History  . Marital status: Married    Spouse name: N/A  . Number of children: N/A  . Years of education: N/A   Occupational History  . Not on file.   Social History Main Topics  . Smoking status: Former Smoker    Quit date: 2013  . Smokeless tobacco: Never Used  . Alcohol use No  . Drug use: No  . Sexual activity: Not on file   Other Topics Concern  . Not on file   Social History Narrative  . No narrative on file    Review of Systems: See HPI, otherwise negative ROS  Physical Exam: BP (!) 115/52   Pulse 76   Temp (!) 95.4 F (35.2 C) (Tympanic)   Resp 18   Ht 5\' 7"  (1.702 m)   Wt 151 lb (68.5 kg)   SpO2 99%   BMI 23.65 kg/m  General:   Alert,  pleasant and cooperative in NAD Head:  Normocephalic and atraumatic. Neck:  Supple; no  masses or thyromegaly. Lungs:  Clear throughout to auscultation.    Heart:  Regular rate and rhythm. Abdomen:  Soft, nontender and nondistended. Normal bowel sounds, without guarding, and without rebound.   Neurologic:  Alert and  oriented x4;  grossly normal neurologically.  Impression/Plan: [name] is here for an endoscopy and colonoscopy to be performed for Weight loss  Risks, benefits, limitations, and alternatives regarding  endoscopy and colonoscopy have been reviewed with the patient.  Questions have been answered.  All parties agreeable.   Lucilla Lame, MD  03/29/2017, 11:05 AM

## 2017-03-29 NOTE — Anesthesia Preprocedure Evaluation (Signed)
Anesthesia Evaluation  Patient identified by MRN, date of birth, ID band Patient awake    Reviewed: Allergy & Precautions, NPO status , Patient's Chart, lab work & pertinent test results, reviewed documented beta blocker date and time   Airway Mallampati: II  TM Distance: >3 FB     Dental   Pulmonary former smoker,    Pulmonary exam normal        Cardiovascular hypertension, Pt. on medications and Pt. on home beta blockers + CAD and +CHF  Normal cardiovascular exam     Neuro/Psych negative neurological ROS  negative psych ROS   GI/Hepatic (+) Hepatitis -, C  Endo/Other  negative endocrine ROS  Renal/GU stones     Musculoskeletal  (+) Arthritis , Osteoarthritis,    Abdominal Normal abdominal exam  (+)   Peds  Hematology negative hematology ROS (+)   Anesthesia Other Findings   Reproductive/Obstetrics                             Anesthesia Physical Anesthesia Plan  ASA: III  Anesthesia Plan: General   Post-op Pain Management:    Induction: Intravenous  PONV Risk Score and Plan:   Airway Management Planned: Nasal Cannula  Additional Equipment:   Intra-op Plan:   Post-operative Plan:   Informed Consent: I have reviewed the patients History and Physical, chart, labs and discussed the procedure including the risks, benefits and alternatives for the proposed anesthesia with the patient or authorized representative who has indicated his/her understanding and acceptance.   Dental advisory given  Plan Discussed with: CRNA and Surgeon  Anesthesia Plan Comments:         Anesthesia Quick Evaluation

## 2017-03-30 ENCOUNTER — Encounter: Payer: Self-pay | Admitting: Gastroenterology

## 2017-03-30 LAB — SURGICAL PATHOLOGY

## 2017-04-06 ENCOUNTER — Encounter: Payer: Self-pay | Admitting: Gastroenterology

## 2017-08-17 DIAGNOSIS — M47816 Spondylosis without myelopathy or radiculopathy, lumbar region: Secondary | ICD-10-CM | POA: Insufficient documentation

## 2017-09-15 ENCOUNTER — Other Ambulatory Visit: Payer: Self-pay | Admitting: Internal Medicine

## 2017-09-15 DIAGNOSIS — M79605 Pain in left leg: Secondary | ICD-10-CM

## 2017-09-15 DIAGNOSIS — M79604 Pain in right leg: Secondary | ICD-10-CM

## 2017-09-19 ENCOUNTER — Ambulatory Visit
Admission: RE | Admit: 2017-09-19 | Discharge: 2017-09-19 | Disposition: A | Discharge status: Home / Self Care | Payer: Medicare Other | Source: Ambulatory Visit | Attending: Internal Medicine | Admitting: Internal Medicine

## 2017-09-19 DIAGNOSIS — M79605 Pain in left leg: Secondary | ICD-10-CM | POA: Diagnosis present

## 2017-09-19 DIAGNOSIS — M79604 Pain in right leg: Secondary | ICD-10-CM | POA: Diagnosis not present

## 2017-11-03 ENCOUNTER — Encounter (INDEPENDENT_AMBULATORY_CARE_PROVIDER_SITE_OTHER): Payer: Self-pay | Admitting: Vascular Surgery

## 2017-11-03 ENCOUNTER — Ambulatory Visit (INDEPENDENT_AMBULATORY_CARE_PROVIDER_SITE_OTHER): Payer: Medicare Other | Admitting: Vascular Surgery

## 2017-11-03 ENCOUNTER — Encounter (INDEPENDENT_AMBULATORY_CARE_PROVIDER_SITE_OTHER): Payer: Self-pay

## 2017-11-03 DIAGNOSIS — I10 Essential (primary) hypertension: Secondary | ICD-10-CM

## 2017-11-03 DIAGNOSIS — M79604 Pain in right leg: Secondary | ICD-10-CM | POA: Diagnosis not present

## 2017-11-03 DIAGNOSIS — I70211 Atherosclerosis of native arteries of extremities with intermittent claudication, right leg: Secondary | ICD-10-CM | POA: Diagnosis not present

## 2017-11-03 DIAGNOSIS — E782 Mixed hyperlipidemia: Secondary | ICD-10-CM

## 2017-11-03 DIAGNOSIS — M1991 Primary osteoarthritis, unspecified site: Secondary | ICD-10-CM | POA: Diagnosis not present

## 2017-11-07 ENCOUNTER — Other Ambulatory Visit (INDEPENDENT_AMBULATORY_CARE_PROVIDER_SITE_OTHER): Payer: Self-pay | Admitting: Vascular Surgery

## 2017-11-08 MED ORDER — CEFAZOLIN SODIUM-DEXTROSE 2-4 GM/100ML-% IV SOLN
2.0000 g | Freq: Once | INTRAVENOUS | Status: AC
  Administered 2017-11-09: 2 g via INTRAVENOUS

## 2017-11-09 ENCOUNTER — Encounter
Admission: RE | Disposition: A | Discharge status: Home / Self Care | Payer: Self-pay | Source: Ambulatory Visit | Attending: Vascular Surgery

## 2017-11-09 ENCOUNTER — Ambulatory Visit
Admission: RE | Admit: 2017-11-09 | Discharge: 2017-11-09 | Disposition: A | Discharge status: Home / Self Care | Payer: Medicare Other | Source: Ambulatory Visit | Attending: Vascular Surgery | Admitting: Vascular Surgery

## 2017-11-09 DIAGNOSIS — I739 Peripheral vascular disease, unspecified: Secondary | ICD-10-CM

## 2017-11-09 DIAGNOSIS — E782 Mixed hyperlipidemia: Secondary | ICD-10-CM | POA: Insufficient documentation

## 2017-11-09 DIAGNOSIS — M1990 Unspecified osteoarthritis, unspecified site: Secondary | ICD-10-CM | POA: Insufficient documentation

## 2017-11-09 DIAGNOSIS — Z8619 Personal history of other infectious and parasitic diseases: Secondary | ICD-10-CM | POA: Diagnosis not present

## 2017-11-09 DIAGNOSIS — I509 Heart failure, unspecified: Secondary | ICD-10-CM | POA: Diagnosis not present

## 2017-11-09 DIAGNOSIS — Z885 Allergy status to narcotic agent status: Secondary | ICD-10-CM | POA: Insufficient documentation

## 2017-11-09 DIAGNOSIS — Z87891 Personal history of nicotine dependence: Secondary | ICD-10-CM | POA: Insufficient documentation

## 2017-11-09 DIAGNOSIS — I2510 Atherosclerotic heart disease of native coronary artery without angina pectoris: Secondary | ICD-10-CM | POA: Diagnosis not present

## 2017-11-09 DIAGNOSIS — Z87442 Personal history of urinary calculi: Secondary | ICD-10-CM | POA: Diagnosis not present

## 2017-11-09 DIAGNOSIS — I110 Hypertensive heart disease with heart failure: Secondary | ICD-10-CM | POA: Insufficient documentation

## 2017-11-09 DIAGNOSIS — I429 Cardiomyopathy, unspecified: Secondary | ICD-10-CM | POA: Insufficient documentation

## 2017-11-09 DIAGNOSIS — Z9889 Other specified postprocedural states: Secondary | ICD-10-CM | POA: Diagnosis not present

## 2017-11-09 DIAGNOSIS — I70211 Atherosclerosis of native arteries of extremities with intermittent claudication, right leg: Secondary | ICD-10-CM | POA: Diagnosis not present

## 2017-11-09 DIAGNOSIS — Z836 Family history of other diseases of the respiratory system: Secondary | ICD-10-CM | POA: Insufficient documentation

## 2017-11-09 DIAGNOSIS — Z9049 Acquired absence of other specified parts of digestive tract: Secondary | ICD-10-CM | POA: Diagnosis not present

## 2017-11-09 DIAGNOSIS — Z981 Arthrodesis status: Secondary | ICD-10-CM | POA: Insufficient documentation

## 2017-11-09 HISTORY — PX: LOWER EXTREMITY ANGIOGRAPHY: CATH118251

## 2017-11-09 LAB — BUN: BUN: 26 mg/dL — AB (ref 6–20)

## 2017-11-09 LAB — CREATININE, SERUM
Creatinine, Ser: 1.29 mg/dL — ABNORMAL HIGH (ref 0.61–1.24)
GFR, EST NON AFRICAN AMERICAN: 59 mL/min — AB (ref >60)

## 2017-11-09 SURGERY — LOWER EXTREMITY ANGIOGRAPHY
Anesthesia: Moderate Sedation | Laterality: Right

## 2017-11-09 MED ORDER — HEPARIN SODIUM (PORCINE) 1000 UNIT/ML IJ SOLN
INTRAMUSCULAR | Status: DC | PRN
  Administered 2017-11-09: 5000 [IU] via INTRAVENOUS

## 2017-11-09 MED ORDER — HEPARIN SODIUM (PORCINE) 1000 UNIT/ML IJ SOLN
INTRAMUSCULAR | Status: AC
  Filled 2017-11-09: qty 1

## 2017-11-09 MED ORDER — MIDAZOLAM HCL 2 MG/2ML IJ SOLN
INTRAMUSCULAR | Status: DC | PRN
  Administered 2017-11-09: 2 mg via INTRAVENOUS
  Administered 2017-11-09: 1 mg via INTRAVENOUS
  Administered 2017-11-09: 2 mg via INTRAVENOUS

## 2017-11-09 MED ORDER — FENTANYL CITRATE (PF) 100 MCG/2ML IJ SOLN
INTRAMUSCULAR | Status: AC
  Filled 2017-11-09: qty 2

## 2017-11-09 MED ORDER — HYDROMORPHONE HCL 1 MG/ML IJ SOLN: 0.5000 mg | INTRAMUSCULAR | Status: DC | PRN

## 2017-11-09 MED ORDER — MIDAZOLAM HCL 5 MG/5ML IJ SOLN
INTRAMUSCULAR | Status: AC
  Filled 2017-11-09: qty 5

## 2017-11-09 MED ORDER — CLOPIDOGREL BISULFATE 300 MG PO TABS
300.0000 mg | ORAL_TABLET | ORAL | Status: AC
  Administered 2017-11-09: 300 mg via ORAL

## 2017-11-09 MED ORDER — LIDOCAINE HCL (PF) 1 % IJ SOLN
INTRAMUSCULAR | Status: AC
  Filled 2017-11-09: qty 30

## 2017-11-09 MED ORDER — DIPHENHYDRAMINE HCL 50 MG/ML IJ SOLN
INTRAMUSCULAR | Status: DC | PRN
  Administered 2017-11-09: 25 mg via INTRAVENOUS

## 2017-11-09 MED ORDER — CLOPIDOGREL BISULFATE 75 MG PO TABS
ORAL_TABLET | ORAL | Status: AC
  Filled 2017-11-09: qty 3

## 2017-11-09 MED ORDER — HEPARIN (PORCINE) IN NACL 1000-0.9 UT/500ML-% IV SOLN
INTRAVENOUS | Status: AC
  Filled 2017-11-09: qty 1000

## 2017-11-09 MED ORDER — FENTANYL CITRATE (PF) 100 MCG/2ML IJ SOLN
INTRAMUSCULAR | Status: DC | PRN
  Administered 2017-11-09 (×3): 50 ug via INTRAVENOUS

## 2017-11-09 MED ORDER — IOPAMIDOL (ISOVUE-300) INJECTION 61%
INTRAVENOUS | Status: DC | PRN
  Administered 2017-11-09: 55 mL via INTRA_ARTERIAL

## 2017-11-09 MED ORDER — ONDANSETRON HCL 4 MG/2ML IJ SOLN: 4.0000 mg | Freq: Four times a day (QID) | INTRAMUSCULAR | Status: DC | PRN

## 2017-11-09 MED ORDER — HYDROMORPHONE HCL 1 MG/ML IJ SOLN: 1.0000 mg | Freq: Once | INTRAMUSCULAR | Status: DC | PRN

## 2017-11-09 MED ORDER — CLOPIDOGREL BISULFATE 75 MG PO TABS
ORAL_TABLET | ORAL | Status: AC
  Administered 2017-11-09: 300 mg via ORAL
  Filled 2017-11-09: qty 1

## 2017-11-09 MED ORDER — SODIUM CHLORIDE 0.9 % IV SOLN
INTRAVENOUS | Status: DC
  Administered 2017-11-09: 13:00:00 via INTRAVENOUS

## 2017-11-09 MED ORDER — DIPHENHYDRAMINE HCL 50 MG/ML IJ SOLN
INTRAMUSCULAR | Status: AC
  Filled 2017-11-09: qty 1

## 2017-11-09 MED ORDER — METHYLPREDNISOLONE SODIUM SUCC 125 MG IJ SOLR: 125.0000 mg | INTRAMUSCULAR | Status: DC | PRN

## 2017-11-09 MED ORDER — FAMOTIDINE 20 MG PO TABS: 40.0000 mg | ORAL_TABLET | ORAL | Status: DC | PRN

## 2017-11-09 MED ORDER — CLOPIDOGREL BISULFATE 75 MG PO TABS: 75.0000 mg | ORAL_TABLET | Freq: Every day | ORAL | 11 refills | Status: DC

## 2017-11-09 MED ORDER — FENTANYL CITRATE (PF) 100 MCG/2ML IJ SOLN
INTRAMUSCULAR | Status: AC
Start: 2017-11-09 — End: 2017-11-09
  Filled 2017-11-09: qty 2

## 2017-11-09 MED ORDER — MIDAZOLAM HCL 2 MG/2ML IJ SOLN
INTRAMUSCULAR | Status: AC
  Filled 2017-11-09: qty 2

## 2017-11-09 SURGICAL SUPPLY — 21 items
BALLN LUTONIX 5X220X130 (BALLOONS) ×3
BALLN LUTONIX DCB 5X100X130 (BALLOONS) ×3
BALLN ULTRVRSE 4X300X130 (BALLOONS) ×3
BALLOON LUTONIX 5X220X130 (BALLOONS) IMPLANT
BALLOON LUTONIX DCB 5X100X130 (BALLOONS) IMPLANT
BALLOON ULTRVRSE 4X300X130 (BALLOONS) IMPLANT
CATH BEACON 5 .038 100 VERT TP (CATHETERS) ×2 IMPLANT
CATH PIG 70CM (CATHETERS) ×2 IMPLANT
DEVICE PRESTO INFLATION (MISCELLANEOUS) ×2 IMPLANT
DEVICE STARCLOSE SE CLOSURE (Vascular Products) ×2 IMPLANT
DEVICE TORQUE .025-.038 (MISCELLANEOUS) ×2 IMPLANT
GLIDEWIRE ANGLED SS 035X260CM (WIRE) ×2 IMPLANT
NDL ENTRY 21GA 7CM ECHOTIP (NEEDLE) IMPLANT
NEEDLE ENTRY 21GA 7CM ECHOTIP (NEEDLE) ×3 IMPLANT
PACK ANGIOGRAPHY (CUSTOM PROCEDURE TRAY) ×3 IMPLANT
SET INTRO CAPELLA COAXIAL (SET/KITS/TRAYS/PACK) ×2 IMPLANT
SHEATH BRITE TIP 5FRX11 (SHEATH) ×2 IMPLANT
SHEATH RAABE 6FR (SHEATH) ×2 IMPLANT
TUBING CONTRAST HIGH PRESS 72 (TUBING) ×2 IMPLANT
WIRE J 3MM .035X145CM (WIRE) ×2 IMPLANT
WIRE MAGIC TORQUE 315CM (WIRE) ×2 IMPLANT

## 2017-11-09 NOTE — Op Note (Signed)
[facility] Percutaneous Study/Intervention Procedural Note   Date of Surgery: 11/09/2017  Surgeon:  Katha Cabal, MD.  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 Introduction catheter into right lower extremity 3rd order catheter placement  2. Contrast injection right lower extremity for distal runoff   3. Percutaneous transluminal angioplasty right superficial femoral artery 4. Star close closure left common femoral arteriotomy  Anesthesia: Conscious sedation was administered under my direct supervision by the interventional radiology RN. IV Versed plus fentanyl were utilized. Continuous ECG, pulse oximetry and blood pressure was monitored throughout the entire procedure.  Conscious sedation was for a total of 46 minutes.  Sheath: 6 French Raby left common femoral  Contrast: 55 cc  Fluoroscopy Time: 5.8 minutes  Indications: [name] presents with lifestyle limiting claudication of the right lower extremity.  CT angiogram was performed by the primary service which demonstrated an SFA occlusion.  The risks and benefits are reviewed all questions answered patient agrees to proceed.  Procedure: [name] is a 62 y.o. y.o. male who was identified and appropriate procedural time out was performed. The patient was then placed supine on the table and prepped and draped in the usual sterile fashion.   Ultrasound was placed in the sterile sleeve and the left groin was evaluated the left common femoral artery was echolucent and pulsatile indicating patency.  Image was recorded for the permanent record and under real-time visualization a microneedle was inserted into the common femoral artery microwire followed by a micro-sheath.  A J-wire was then  advanced through the micro-sheath and a  5 [place] sheath was then inserted over a J-wire. J-wire was then advanced and a 5 French pigtail catheter was positioned at the level of T12. AP projection of the aorta was then obtained. Pigtail catheter was repositioned to above the bifurcation and a LAO view of the pelvis was obtained.  Subsequently a pigtail catheter with the stiff angle Glidewire was used to cross the aortic bifurcation the catheter wire were advanced down into the right distal external iliac artery. Oblique view of the femoral bifurcation was then obtained and subsequently the wire was reintroduced and the pigtail catheter negotiated into the SFA representing third order catheter placement. Distal runoff was then performed.  5000 units of heparin was then given and allowed to circulate and a 6 [place] Raby sheath was advanced up and over the bifurcation and positioned in the femoral artery  [name]  catheter and stiff angle Glidewire were then negotiated down into the distal popliteal.  Distal runoff was then completed by hand injection through the catheter. The wire was then reintroduced and a 4 mm x 30 cm Ultraverse balloon was used to angioplasty the superficial femoral and popliteal arteries. Inflations were to 14 atmospheres for 2 minutes. Follow-up imaging demonstrated patency with less than 30% % residual stenosis.  Therefore, a 5 mm x 22 cm Lutonix drug-eluting balloon and subsequently a 5 mm x 10 cm loop tonics drug-eluting balloon were used to treat the proximal popliteal and the SFA.  Inflations were to 12 atm for 2 minutes.  Follow-up imaging demonstrated wide patency of the SFA and popliteal with less than 5% residual stenosis.  Distal runoff was then reassessed and found to be unchanged with the posterior tibial being the dominant runoff to the foot filling the pedal arch.  After review of these images the sheath  is pulled into the left external iliac oblique of the common femoral is  obtained and a Star close device deployed. There no immediate complications.   Findings: The abdominal aorta is opacified with a bolus injection contrast.  Imaging of the aortic branches and the iliac arteries is inadequate on the CT angios likely secondary to movement artifact.  Renal arteries are widely patent and single. The aorta itself has diffuse disease but no hemodynamically significant lesions. The common and external iliac arteries are widely patent bilaterally.  The right common femoral is widely patent as is the profunda femoris.  The SFA does indeed have a significant stenosis throughout its proximal two thirds which then courses to an occlusion at Hunter's canal.  The distal popliteal demonstrates minimal disease and the trifurcation is patent with three-vessel runoff.  Dominant tibial is the posterior tibial which is well visualized to the ankle filling the pedal arch.  Following angioplasty of the right SFA there is now patent with in-line flow and looks quite nice and less than 5% residual stenosis compared to the total occlusion noted on the initial images..     Summary: Successful recanalization right lower extremity for limb salvage    Disposition: Patient was taken to the recovery room in stable condition having tolerated the procedure well.  Schnier, Dolores Lory 11/09/2017,3:56 PM

## 2017-11-09 NOTE — H&P (Signed)
[facility] History & Physical Update  The patient was interviewed and re-examined.  The patient's previous History and Physical has been reviewed and is unchanged.  There is no change in the plan of care. We plan to proceed with the scheduled procedure.  Hortencia Pilar, MD  11/09/2017, 2:00 PM

## 2017-11-09 NOTE — Discharge Instructions (Signed)
Groin Insertion Instructions-If you lose feeling or develop tingling or pain in your leg or foot after the procedure, please walk around first.  If the discomfort does not improve , contact your physician and proceed to the nearest emergency room.  Loss of feeling in your leg might mean that a blockage has formed in the artery and this can be appropriately treated.  Limit your activity for the next two days after your procedure.  Avoid stooping, bending, heavy lifting or exertion as this may put pressure on the insertion site.  Resume normal activities in 48 hours.  You may shower after 24 hours but avoid excessive warm water and do not scrub the site.  Remove clear dressing in 48 hours.  If you have had a closure device inserted, do not soak in a tub bath or a hot tub for at least one week.  No driving for 48 hours after discharge.  After the procedure, check the insertion site occasionally.  If any oozing occurs or there is apparent swelling, firm pressure over the site will prevent a bruise from forming.  You can not hurt anything by pressing directly on the site.  The pressure stops the bleeding by allowing a small clot to form.  If the bleeding continues after the pressure has been applied for more than 15 minutes, call 911 or go to the nearest emergency room.    The x-ray dye causes you to pass a considerate amount of urine.  For this reason, you will be asked to drink plenty of liquids after the procedure to prevent dehydration.  You may resume you regular diet.  Avoid caffeine products.    For pain at the site of your procedure, take non-aspirin medicines such as Tylenol.  Medications: A. Hold Metformin for 48 hours if applicable.  B. Continue taking all your present medications at home unless your doctor prescribes any changes.   Femoral Site Care Refer to this sheet in the next few weeks. These instructions provide you with information about caring for yourself after your procedure. Your health  care provider may also give you more specific instructions. Your treatment has been planned according to current medical practices, but problems sometimes occur. Call your health care provider if you have any problems or questions after your procedure. What can I expect after the procedure? After your procedure, it is typical to have the following:  Bruising at the site that usually fades within 1-2 weeks.  Blood collecting in the tissue (hematoma) that may be painful to the touch. It should usually decrease in size and tenderness within 1-2 weeks.  Follow these instructions at home:  Take medicines only as directed by your health care provider.  You may shower 24-48 hours after the procedure or as directed by your health care provider. Remove the bandage (dressing) and gently wash the site with plain soap and water. Pat the area dry with a clean towel. Do not rub the site, because this may cause bleeding.  Do not take baths, swim, or use a hot tub until your health care provider approves.  Check your insertion site every day for redness, swelling, or drainage.  Do not apply powder or lotion to the site.  Limit use of stairs to twice a day for the first 2-3 days or as directed by your health care provider.  Do not squat for the first 2-3 days or as directed by your health care provider.  Do not lift over 10 lb (4.5 kg)  for 5 days after your procedure or as directed by your health care provider.  Ask your health care provider when it is okay to: ? Return to work or school. ? Resume usual physical activities or sports. ? Resume sexual activity.  Do not drive home if you are discharged the same day as the procedure. Have someone else drive you.  You may drive 24 hours after the procedure unless otherwise instructed by your health care provider.  Do not operate machinery or power tools for 24 hours after the procedure or as directed by your health care provider.  If your procedure  was done as an outpatient procedure, which means that you went home the same day as your procedure, a responsible adult should be with you for the first 24 hours after you arrive home.  Keep all follow-up visits as directed by your health care provider. This is important. Contact a health care provider if:  You have a fever.  You have chills.  You have increased bleeding from the site. Hold pressure on the site. Get help right away if:  You have unusual pain at the site.  You have redness, warmth, or swelling at the site.  You have drainage (other than a small amount of blood on the dressing) from the site.  The site is bleeding, and the bleeding does not stop after 30 minutes of holding steady pressure on the site.  Your leg or foot becomes pale, cool, tingly, or numb. This information is not intended to replace advice given to you by your health care provider. Make sure you discuss any questions you have with your health care provider. Document Released: 03/01/2014 Document Revised: 12/04/2015 Document Reviewed: 01/15/2014 Elsevier Interactive Patient Education  2018 [place].   Moderate Conscious Sedation, Adult, Care After These instructions provide you with information about caring for yourself after your procedure. Your health care provider may also give you more specific instructions. Your treatment has been planned according to current medical practices, but problems sometimes occur. Call your health care provider if you have any problems or questions after your procedure. What can I expect after the procedure? After your procedure, it is common:  To feel sleepy for several hours.  To feel clumsy and have poor balance for several hours.  To have poor judgment for several hours.  To vomit if you eat too soon.  Follow these instructions at home: For at least 24 hours after the procedure:   Do not: ? Participate in activities where you could fall or become  injured. ? Drive. ? Use heavy machinery. ? Drink alcohol. ? Take sleeping pills or medicines that cause drowsiness. ? Make important decisions or sign legal documents. ? Take care of children on your own.  Rest. Eating and drinking  Follow the diet recommended by your health care provider.  If you vomit: ? Drink water, juice, or soup when you can drink without vomiting. ? Make sure you have little or no nausea before eating solid foods. General instructions  Have a responsible adult stay with you until you are awake and alert.  Take over-the-counter and prescription medicines only as told by your health care provider.  If you smoke, do not smoke without supervision.  Keep all follow-up visits as told by your health care provider. This is important. Contact a health care provider if:  You keep feeling nauseous or you keep vomiting.  You feel light-headed.  You develop a rash.  You have a fever.  Get help right away if:  You have trouble breathing. This information is not intended to replace advice given to you by your health care provider. Make sure you discuss any questions you have with your health care provider. Document Released: 04/18/2013 Document Revised: 12/01/2015 Document Reviewed: 10/18/2015 Elsevier Interactive Patient Education  [employer].

## 2017-11-10 ENCOUNTER — Encounter: Payer: Self-pay | Admitting: Vascular Surgery

## 2017-11-10 ENCOUNTER — Inpatient Hospital Stay: Admission: RE | Admit: 2017-11-10 | Payer: Medicare Other | Source: Ambulatory Visit

## 2017-11-10 DIAGNOSIS — E785 Hyperlipidemia, unspecified: Secondary | ICD-10-CM | POA: Insufficient documentation

## 2017-11-10 DIAGNOSIS — M79606 Pain in leg, unspecified: Secondary | ICD-10-CM | POA: Insufficient documentation

## 2017-11-10 DIAGNOSIS — I70219 Atherosclerosis of native arteries of extremities with intermittent claudication, unspecified extremity: Secondary | ICD-10-CM | POA: Insufficient documentation

## 2017-11-10 DIAGNOSIS — I10 Essential (primary) hypertension: Secondary | ICD-10-CM | POA: Insufficient documentation

## 2017-11-10 DIAGNOSIS — M1990 Unspecified osteoarthritis, unspecified site: Secondary | ICD-10-CM | POA: Insufficient documentation

## 2017-11-10 NOTE — Progress Notes (Signed)
MRN : [id number]  [name] is a 62 y.o. (01-14-1956) male who presents with chief complaint of  Chief Complaint  Patient presents with  . New Patient (Initial Visit)    ref Humphrey Rolls for superficial femoral artery occlusion    The patient is seen for evaluation of painful lower extremities and diminished pulses. Patient notes the pain is always associated with activity and is very consistent day today. Typically, the pain occurs at less than one block, progress is as activity continues to the point that the patient must stop walking. Resting including standing still for several minutes allowed resumption of the activity and the ability to walk a similar distance before stopping again. Uneven terrain and inclined shorten the distance. The pain has been progressive over the past several years. The patient states the inability to walk is now having a profound negative impact on quality of life and daily activities.  The patient denies rest pain or dangling of an extremity off the side of the bed during the night for relief. No open wounds or sores at this time. No prior interventions or surgeries.  No history of back problems or DJD of the lumbar sacral spine.   The patient denies changes in claudication symptoms or new rest pain symptoms.  No new ulcers or wounds of the foot.  The patient's blood pressure has been stable and relatively well controlled. The patient denies amaurosis fugax or recent TIA symptoms. There are no recent neurological changes noted. The patient denies history of DVT, PE or superficial thrombophlebitis. The patient denies recent episodes of angina or shortness of breath.   CT angiogram was performed which is consistent with an occlusion of the SFA.  There is artifact noted in the aorta iliac region and distally the tibial runoff is inadequate.   Current Meds  Medication Sig  . ALPRAZolam (XANAX) 1 MG tablet Take 1 mg by mouth 3 (three) times daily.  Marland Kitchen aspirin  (GOODSENSE ASPIRIN) 325 MG tablet Take 325 mg by mouth daily.   . carvedilol (COREG) 25 MG tablet Take 25 mg by mouth 2 (two) times daily.   . fluticasone (FLONASE) 50 MCG/ACT nasal spray Place 1 spray into both nostrils daily as needed for allergies.   . furosemide (LASIX) 20 MG tablet Take 20 mg by mouth daily as needed for fluid.   . meloxicam (MOBIC) 15 MG tablet Take 15 mg by mouth daily as needed (for inflammation.).   Marland Kitchen oxyCODONE-acetaminophen (PERCOCET) 10-325 MG tablet Take 1 tablet by mouth 4 (four) times daily.   . simvastatin (ZOCOR) 40 MG tablet Take 40 mg by mouth daily.   . [DISCONTINUED] ALPRAZolam (XANAX) 1 MG tablet   . [DISCONTINUED] enalapril (VASOTEC) 10 MG tablet Take by mouth.  . [DISCONTINUED] sacubitril-valsartan (ENTRESTO) 97-103 MG Take 1 tablet by mouth 2 (two) times daily.    Past Medical History:  Diagnosis Date  . Arthritis    knees, lower back  . Cardiomyopathy ([place])   . CHF (congestive heart failure) ([place])   . Chronic back pain   . Coronary artery disease   . Hepatitis C virus infection without hepatic coma    received treatment. clear now  . Hypertension   . Kidney stones     Past Surgical History:  Procedure Laterality Date  . BACK SURGERY    . CARDIAC CATHETERIZATION    . CHOLECYSTECTOMY    . COLONOSCOPY WITH PROPOFOL N/A 03/29/2017   Procedure: COLONOSCOPY WITH PROPOFOL;  Surgeon:  Lucilla Lame, MD;  Location: [facility];  Service: Endoscopy;  Laterality: N/A;  . ESOPHAGOGASTRODUODENOSCOPY (EGD) WITH PROPOFOL N/A 03/29/2017   Procedure: ESOPHAGOGASTRODUODENOSCOPY (EGD) WITH PROPOFOL;  Surgeon: Lucilla Lame, MD;  Location: [facility] ENDOSCOPY;  Service: Endoscopy;  Laterality: N/A;  . HERNIA REPAIR    . SPINAL FUSION      Social History Social History   Tobacco Use  . Smoking status: Former Smoker    Last attempt to quit: 2013    Years since quitting: 6.3  . Smokeless tobacco: Never Used  Substance Use Topics  . Alcohol use: No  . Drug  use: No    Family History Family History  Problem Relation Age of Onset  . COPD Mother   No family history of bleeding/clotting disorders, porphyria or autoimmune disease   Allergies  Allergen Reactions  . Codeine Hives and Itching     REVIEW OF SYSTEMS (Negative unless checked)  Constitutional: [] Weight loss  [] Fever  [] Chills Cardiac: [] Chest pain   [] Chest pressure   [] Palpitations   [] Shortness of breath when laying flat   [] Shortness of breath with exertion. Vascular:  [x] Pain in legs with walking   [] Pain in legs at rest  [] History of DVT   [] Phlebitis   [] Swelling in legs   [] Varicose veins   [] Non-healing ulcers Pulmonary:   [] Uses home oxygen   [] Productive cough   [] Hemoptysis   [] Wheeze  [] COPD   [] Asthma Neurologic:  [] Dizziness   [] Seizures   [] History of stroke   [] History of TIA  [] Aphasia   [] Vissual changes   [] Weakness or numbness in arm   [] Weakness or numbness in leg Musculoskeletal:   [] Joint swelling   [] Joint pain   [] Low back pain Hematologic:  [] Easy bruising  [] Easy bleeding   [] Hypercoagulable state   [] Anemic Gastrointestinal:  [] Diarrhea   [] Vomiting  [] Gastroesophageal reflux/heartburn   [] Difficulty swallowing. Genitourinary:  [] Chronic kidney disease   [] Difficult urination  [] Frequent urination   [] Blood in urine Skin:  [] Rashes   [] Ulcers  Psychological:  [] History of anxiety   []  History of major depression.  Physical Examination  Vitals:   11/03/17 1321  BP: 99/61  Pulse: 61  Resp: 16  Weight: 152 lb (68.9 kg)  Height: 5\' 7"  (1.702 m)   Body mass index is 23.81 kg/m. Gen: WD/WN, NAD Head: [place]/AT, No temporalis wasting.  Ear/Nose/Throat: Hearing grossly intact, nares w/o erythema or drainage, poor dentition Eyes: PER, EOMI, sclera nonicteric.  Neck: Supple, no masses.  No bruit or JVD.  Pulmonary:  Good air movement, clear to auscultation bilaterally, no use of accessory muscles.  Cardiac: RRR, normal S1, S2, no Murmurs. Vascular:   Sluggish cap refill 4+ sec right foot Vessel Right Left  Radial Palpable Palpable  Popliteal Not Palpable Trace Palpable  PT Not Palpable Trace Palpable  DP Not Palpable Trace Palpable   Gastrointestinal: soft, non-distended. No guarding/no peritoneal signs.  Musculoskeletal: M/S 5/5 throughout.  No deformity or atrophy.  Neurologic: CN 2-12 intact. Pain and light touch intact in extremities.  Symmetrical.  Speech is fluent. Motor exam as listed above. Psychiatric: Judgment intact, Mood & affect appropriate for pt's clinical situation. Dermatologic: No rashes or ulcers noted.  No changes consistent with cellulitis. Lymph : No Cervical lymphadenopathy, no lichenification or skin changes of chronic lymphedema.  CBC Lab Results  Component Value Date   WBC 10.3 03/09/2017   HGB 13.3 03/09/2017   HCT 41.3 03/09/2017   MCV 82.8 03/09/2017   PLT  225 03/09/2017    BMET    Component Value Date/Time   NA 139 03/27/2014 0935   K 4.7 03/27/2014 0935   CL 103 03/27/2014 0935   CO2 28 03/27/2014 0935   GLUCOSE 109 (H) 03/27/2014 0935   BUN 26 (H) 11/09/2017 1229   BUN 17 03/27/2014 0935   CREATININE 1.29 (H) 11/09/2017 1229   CREATININE 1.52 (H) 03/27/2014 0935   CALCIUM 8.9 03/27/2014 0935   GFRNONAA 59 (L) 11/09/2017 1229   GFRNONAA 51 (L) 03/27/2014 0935   GFRAA >60 11/09/2017 1229   GFRAA 59 (L) 03/27/2014 0935   Estimated Creatinine Clearance: 57.6 mL/min (A) (by C-G formula based on SCr of 1.29 mg/dL (H)).  COAG Lab Results  Component Value Date   INR 0.9 03/01/2016   INR 0.8 08/07/2007   INR 0.9 05/04/2007    Radiology No results found.   Assessment/Plan 1. Atherosclerosis of native artery of right lower extremity with intermittent claudication (HCC) Recommend:  The patient has experienced increased symptoms and is now describing lifestyle limiting claudication and mild rest pain.   Given the severity of the patient's lower extremity symptoms the patient should  undergo angiography and intervention.  Risk and benefits were reviewed the patient.  Indications for the procedure were reviewed.  All questions were answered, the patient agrees to proceed.   The patient should continue walking and begin a more formal exercise program.  The patient should continue antiplatelet therapy and aggressive treatment of the lipid abnormalities  The patient will follow up with me after the angiogram.   2. Pain of right lower extremity Recommend:  The patient has experienced increased symptoms and is now describing lifestyle limiting claudication and mild rest pain.   Given the severity of the patient's lower extremity symptoms the patient should undergo angiography and intervention.  Risk and benefits were reviewed the patient.  Indications for the procedure were reviewed.  All questions were answered, the patient agrees to proceed.   The patient should continue walking and begin a more formal exercise program.  The patient should continue antiplatelet therapy and aggressive treatment of the lipid abnormalities  The patient will follow up with me after the angiogram.   3. Mixed hyperlipidemia Continue statin as ordered and reviewed, no changes at this time   4. Primary osteoarthritis, unspecified site Continue NSAID medications as already ordered, these medications have been reviewed and there are no changes at this time.  Continued activity and therapy was stressed.   5. Essential hypertension Continue antihypertensive medications as already ordered, these medications have been reviewed and there are no changes at this time.     Hortencia Pilar, MD  11/10/2017 8:31 AM

## 2017-11-16 ENCOUNTER — Encounter (INDEPENDENT_AMBULATORY_CARE_PROVIDER_SITE_OTHER): Payer: Self-pay

## 2017-12-04 ENCOUNTER — Other Ambulatory Visit: Payer: Self-pay

## 2017-12-04 ENCOUNTER — Encounter: Payer: Self-pay | Admitting: Emergency Medicine

## 2017-12-04 ENCOUNTER — Observation Stay
Admission: EM | Admit: 2017-12-04 | Discharge: 2017-12-05 | Disposition: A | Discharge status: Home / Self Care | Payer: Medicare Other | Attending: Internal Medicine | Admitting: Internal Medicine

## 2017-12-04 ENCOUNTER — Emergency Department: Payer: Medicare Other

## 2017-12-04 DIAGNOSIS — G8929 Other chronic pain: Secondary | ICD-10-CM | POA: Diagnosis not present

## 2017-12-04 DIAGNOSIS — Z9049 Acquired absence of other specified parts of digestive tract: Secondary | ICD-10-CM | POA: Diagnosis not present

## 2017-12-04 DIAGNOSIS — M549 Dorsalgia, unspecified: Secondary | ICD-10-CM | POA: Diagnosis not present

## 2017-12-04 DIAGNOSIS — Z981 Arthrodesis status: Secondary | ICD-10-CM | POA: Insufficient documentation

## 2017-12-04 DIAGNOSIS — J209 Acute bronchitis, unspecified: Secondary | ICD-10-CM

## 2017-12-04 DIAGNOSIS — R0902 Hypoxemia: Secondary | ICD-10-CM | POA: Diagnosis present

## 2017-12-04 DIAGNOSIS — E785 Hyperlipidemia, unspecified: Secondary | ICD-10-CM | POA: Insufficient documentation

## 2017-12-04 DIAGNOSIS — Z8619 Personal history of other infectious and parasitic diseases: Secondary | ICD-10-CM | POA: Diagnosis not present

## 2017-12-04 DIAGNOSIS — J441 Chronic obstructive pulmonary disease with (acute) exacerbation: Secondary | ICD-10-CM | POA: Diagnosis present

## 2017-12-04 DIAGNOSIS — I2510 Atherosclerotic heart disease of native coronary artery without angina pectoris: Secondary | ICD-10-CM | POA: Diagnosis not present

## 2017-12-04 DIAGNOSIS — M170 Bilateral primary osteoarthritis of knee: Secondary | ICD-10-CM | POA: Insufficient documentation

## 2017-12-04 DIAGNOSIS — I110 Hypertensive heart disease with heart failure: Secondary | ICD-10-CM | POA: Diagnosis not present

## 2017-12-04 DIAGNOSIS — Z6824 Body mass index (BMI) 24.0-24.9, adult: Secondary | ICD-10-CM | POA: Insufficient documentation

## 2017-12-04 DIAGNOSIS — I509 Heart failure, unspecified: Secondary | ICD-10-CM | POA: Diagnosis not present

## 2017-12-04 DIAGNOSIS — Z885 Allergy status to narcotic agent status: Secondary | ICD-10-CM | POA: Diagnosis not present

## 2017-12-04 DIAGNOSIS — R634 Abnormal weight loss: Secondary | ICD-10-CM | POA: Insufficient documentation

## 2017-12-04 DIAGNOSIS — J449 Chronic obstructive pulmonary disease, unspecified: Secondary | ICD-10-CM

## 2017-12-04 DIAGNOSIS — Z87891 Personal history of nicotine dependence: Secondary | ICD-10-CM | POA: Diagnosis not present

## 2017-12-04 DIAGNOSIS — J9601 Acute respiratory failure with hypoxia: Principal | ICD-10-CM

## 2017-12-04 DIAGNOSIS — M4696 Unspecified inflammatory spondylopathy, lumbar region: Secondary | ICD-10-CM | POA: Insufficient documentation

## 2017-12-04 DIAGNOSIS — Z87442 Personal history of urinary calculi: Secondary | ICD-10-CM | POA: Diagnosis not present

## 2017-12-04 DIAGNOSIS — I10 Essential (primary) hypertension: Secondary | ICD-10-CM | POA: Diagnosis present

## 2017-12-04 LAB — COMPREHENSIVE METABOLIC PANEL
ALBUMIN: 3.5 g/dL (ref 3.5–5.0)
ALT: 11 U/L — ABNORMAL LOW (ref 17–63)
ANION GAP: 8 (ref 5–15)
AST: 18 U/L (ref 15–41)
Alkaline Phosphatase: 45 U/L (ref 38–126)
BILIRUBIN TOTAL: 0.7 mg/dL (ref 0.3–1.2)
BUN: 21 mg/dL — ABNORMAL HIGH (ref 6–20)
CO2: 26 mmol/L (ref 22–32)
Calcium: 8.9 mg/dL (ref 8.9–10.3)
Chloride: 104 mmol/L (ref 101–111)
Creatinine, Ser: 1.22 mg/dL (ref 0.61–1.24)
GFR calc Af Amer: >60 mL/min (ref >60)
Glucose, Bld: 94 mg/dL (ref 65–99)
POTASSIUM: 3.8 mmol/L (ref 3.5–5.1)
Sodium: 138 mmol/L (ref 135–145)
TOTAL PROTEIN: 6.4 g/dL — AB (ref 6.5–8.1)

## 2017-12-04 LAB — BLOOD GAS, VENOUS
ACID-BASE EXCESS: 3.3 mmol/L — AB (ref 0.0–2.0)
Bicarbonate: 29.6 mmol/L — ABNORMAL HIGH (ref 20.0–28.0)
O2 SAT: 90.1 %
PATIENT TEMPERATURE: 37
PCO2 VEN: 50 mmHg (ref 44.0–60.0)
pH, Ven: 7.38 (ref 7.250–7.430)
pO2, Ven: 60 mmHg — ABNORMAL HIGH (ref 32.0–45.0)

## 2017-12-04 LAB — CBC WITH DIFFERENTIAL/PLATELET
BASOS PCT: 0 %
Basophils Absolute: 0 10*3/uL (ref 0–0.1)
Eosinophils Absolute: 0.3 10*3/uL (ref 0–0.7)
Eosinophils Relative: 3 %
HEMATOCRIT: 43.3 % (ref 40.0–52.0)
Hemoglobin: 14.1 g/dL (ref 13.0–18.0)
Lymphocytes Relative: 17 %
Lymphs Abs: 1.9 10*3/uL (ref 1.0–3.6)
MCH: 26.5 pg (ref 26.0–34.0)
MCHC: 32.7 g/dL (ref 32.0–36.0)
MCV: 81.2 fL (ref 80.0–100.0)
MONO ABS: 0.9 10*3/uL (ref 0.2–1.0)
MONOS PCT: 8 %
NEUTROS ABS: 8.4 10*3/uL — AB (ref 1.4–6.5)
Neutrophils Relative %: 72 %
Platelets: 291 10*3/uL (ref 150–440)
RBC: 5.33 MIL/uL (ref 4.40–5.90)
RDW: 15.3 % — AB (ref 11.5–14.5)
WBC: 11.6 10*3/uL — ABNORMAL HIGH (ref 3.8–10.6)

## 2017-12-04 LAB — TROPONIN I: Troponin I: <0.03 ng/mL (ref <0.03)

## 2017-12-04 LAB — BRAIN NATRIURETIC PEPTIDE: B Natriuretic Peptide: 559 pg/mL — ABNORMAL HIGH (ref 0.0–100.0)

## 2017-12-04 MED ORDER — FAMOTIDINE IN NACL 20-0.9 MG/50ML-% IV SOLN
20.0000 mg | Freq: Two times a day (BID) | INTRAVENOUS | Status: DC
  Administered 2017-12-04 – 2017-12-05 (×3): 20 mg via INTRAVENOUS
  Filled 2017-12-04 (×3): qty 50

## 2017-12-04 MED ORDER — CARVEDILOL 25 MG PO TABS
25.0000 mg | ORAL_TABLET | Freq: Two times a day (BID) | ORAL | Status: DC
  Administered 2017-12-04 – 2017-12-05 (×3): 25 mg via ORAL
  Filled 2017-12-04 (×3): qty 1

## 2017-12-04 MED ORDER — SIMVASTATIN 40 MG PO TABS
40.0000 mg | ORAL_TABLET | Freq: Every day | ORAL | Status: DC
  Administered 2017-12-04 – 2017-12-05 (×2): 40 mg via ORAL
  Filled 2017-12-04 (×2): qty 1

## 2017-12-04 MED ORDER — CLOPIDOGREL BISULFATE 75 MG PO TABS
75.0000 mg | ORAL_TABLET | Freq: Every day | ORAL | Status: DC
  Administered 2017-12-04 – 2017-12-05 (×2): 75 mg via ORAL
  Filled 2017-12-04 (×2): qty 1

## 2017-12-04 MED ORDER — FUROSEMIDE 20 MG PO TABS: 20.0000 mg | ORAL_TABLET | Freq: Every day | ORAL | Status: DC | PRN

## 2017-12-04 MED ORDER — DOCUSATE SODIUM 100 MG PO CAPS
100.0000 mg | ORAL_CAPSULE | Freq: Two times a day (BID) | ORAL | Status: DC
  Administered 2017-12-04 – 2017-12-05 (×3): 100 mg via ORAL
  Filled 2017-12-04 (×3): qty 1

## 2017-12-04 MED ORDER — SODIUM CHLORIDE 0.9 % IV SOLN
500.0000 mg | INTRAVENOUS | Status: DC
  Administered 2017-12-04: 500 mg via INTRAVENOUS
  Filled 2017-12-04 (×2): qty 500

## 2017-12-04 MED ORDER — METHYLPREDNISOLONE SODIUM SUCC 125 MG IJ SOLR
60.0000 mg | Freq: Four times a day (QID) | INTRAMUSCULAR | Status: DC
  Administered 2017-12-04 – 2017-12-05 (×5): 60 mg via INTRAVENOUS
  Filled 2017-12-04 (×5): qty 2

## 2017-12-04 MED ORDER — MAGNESIUM SULFATE 2 GM/50ML IV SOLN
2.0000 g | Freq: Once | INTRAVENOUS | Status: AC
  Administered 2017-12-04: 2 g via INTRAVENOUS
  Filled 2017-12-04: qty 50

## 2017-12-04 MED ORDER — ACETAMINOPHEN 325 MG PO TABS: 650.0000 mg | ORAL_TABLET | Freq: Four times a day (QID) | ORAL | Status: DC | PRN

## 2017-12-04 MED ORDER — SACUBITRIL-VALSARTAN 49-51 MG PO TABS
1.0000 | ORAL_TABLET | Freq: Two times a day (BID) | ORAL | Status: DC
  Administered 2017-12-04 – 2017-12-05 (×3): 1 via ORAL
  Filled 2017-12-04 (×4): qty 1

## 2017-12-04 MED ORDER — SODIUM CHLORIDE 0.9 % IV SOLN
INTRAVENOUS | Status: DC
  Administered 2017-12-04 – 2017-12-05 (×2): via INTRAVENOUS

## 2017-12-04 MED ORDER — OXYCODONE-ACETAMINOPHEN 5-325 MG PO TABS
1.0000 | ORAL_TABLET | ORAL | Status: DC | PRN
  Administered 2017-12-04 – 2017-12-05 (×3): 1 via ORAL
  Filled 2017-12-04 (×2): qty 1

## 2017-12-04 MED ORDER — MOMETASONE FURO-FORMOTEROL FUM 200-5 MCG/ACT IN AERO
2.0000 | INHALATION_SPRAY | Freq: Two times a day (BID) | RESPIRATORY_TRACT | Status: DC
  Administered 2017-12-04 – 2017-12-05 (×3): 2 via RESPIRATORY_TRACT
  Filled 2017-12-04: qty 8.8

## 2017-12-04 MED ORDER — IPRATROPIUM-ALBUTEROL 0.5-2.5 (3) MG/3ML IN SOLN
3.0000 mL | Freq: Once | RESPIRATORY_TRACT | Status: AC
  Administered 2017-12-04: 3 mL via RESPIRATORY_TRACT
  Filled 2017-12-04: qty 3

## 2017-12-04 MED ORDER — METHYLPREDNISOLONE SODIUM SUCC 125 MG IJ SOLR
125.0000 mg | Freq: Once | INTRAMUSCULAR | Status: AC
  Administered 2017-12-04: 125 mg via INTRAVENOUS
  Filled 2017-12-04: qty 2

## 2017-12-04 MED ORDER — MONTELUKAST SODIUM 10 MG PO TABS
10.0000 mg | ORAL_TABLET | Freq: Every day | ORAL | Status: DC
  Administered 2017-12-04: 10 mg via ORAL
  Filled 2017-12-04: qty 1

## 2017-12-04 MED ORDER — OXYCODONE HCL 5 MG PO TABS: 5.0000 mg | ORAL_TABLET | Freq: Four times a day (QID) | ORAL | Status: DC | PRN

## 2017-12-04 MED ORDER — IPRATROPIUM-ALBUTEROL 0.5-2.5 (3) MG/3ML IN SOLN
3.0000 mL | Freq: Four times a day (QID) | RESPIRATORY_TRACT | Status: DC
  Administered 2017-12-04 – 2017-12-05 (×4): 3 mL via RESPIRATORY_TRACT
  Filled 2017-12-04 (×4): qty 3

## 2017-12-04 MED ORDER — OXYCODONE-ACETAMINOPHEN 5-325 MG PO TABS
1.0000 | ORAL_TABLET | Freq: Four times a day (QID) | ORAL | Status: DC | PRN
  Administered 2017-12-04: 1 via ORAL
  Filled 2017-12-04: qty 1

## 2017-12-04 MED ORDER — TIOTROPIUM BROMIDE MONOHYDRATE 18 MCG IN CAPS
18.0000 ug | ORAL_CAPSULE | Freq: Every morning | RESPIRATORY_TRACT | Status: DC
  Administered 2017-12-04 – 2017-12-05 (×2): 18 ug via RESPIRATORY_TRACT
  Filled 2017-12-04: qty 5

## 2017-12-04 MED ORDER — OXYCODONE-ACETAMINOPHEN 10-325 MG PO TABS: 1.0000 | ORAL_TABLET | Freq: Four times a day (QID) | ORAL | Status: DC

## 2017-12-04 MED ORDER — HYDROCOD POLST-CPM POLST ER 10-8 MG/5ML PO SUER
5.0000 mL | Freq: Two times a day (BID) | ORAL | Status: DC | PRN
  Administered 2017-12-05 (×2): 5 mL via ORAL
  Filled 2017-12-04 (×2): qty 5

## 2017-12-04 MED ORDER — ALPRAZOLAM 1 MG PO TABS
1.0000 mg | ORAL_TABLET | Freq: Three times a day (TID) | ORAL | Status: DC
  Administered 2017-12-04 – 2017-12-05 (×3): 1 mg via ORAL
  Filled 2017-12-04 (×3): qty 1

## 2017-12-04 MED ORDER — LORAZEPAM 2 MG/ML IJ SOLN: 0.5000 mg | INTRAMUSCULAR | Status: DC | PRN

## 2017-12-04 MED ORDER — HEPARIN SODIUM (PORCINE) 5000 UNIT/ML IJ SOLN
5000.0000 [IU] | Freq: Three times a day (TID) | INTRAMUSCULAR | Status: DC
  Administered 2017-12-04 – 2017-12-05 (×3): 5000 [IU] via SUBCUTANEOUS
  Filled 2017-12-04 (×5): qty 1

## 2017-12-04 MED ORDER — ONDANSETRON HCL 4 MG PO TABS
4.0000 mg | ORAL_TABLET | Freq: Four times a day (QID) | ORAL | Status: DC | PRN
  Filled 2017-12-04: qty 1

## 2017-12-04 MED ORDER — ONDANSETRON HCL 4 MG/2ML IJ SOLN: 4.0000 mg | Freq: Four times a day (QID) | INTRAMUSCULAR | Status: DC | PRN

## 2017-12-04 MED ORDER — ACETAMINOPHEN 650 MG RE SUPP: 650.0000 mg | Freq: Four times a day (QID) | RECTAL | Status: DC | PRN

## 2017-12-04 MED ORDER — OXYCODONE HCL 5 MG PO TABS
5.0000 mg | ORAL_TABLET | Freq: Four times a day (QID) | ORAL | Status: DC | PRN
  Filled 2017-12-04: qty 1

## 2017-12-04 MED ORDER — ASPIRIN 325 MG PO TABS
325.0000 mg | ORAL_TABLET | Freq: Every day | ORAL | Status: DC
Start: 2017-12-04 — End: 2017-12-05
  Administered 2017-12-04 – 2017-12-05 (×2): 325 mg via ORAL
  Filled 2017-12-04 (×2): qty 1

## 2017-12-04 MED ORDER — OXYCODONE HCL 5 MG PO TABS
5.0000 mg | ORAL_TABLET | ORAL | Status: DC | PRN
  Administered 2017-12-04 – 2017-12-05 (×3): 5 mg via ORAL
  Filled 2017-12-04 (×2): qty 1

## 2017-12-04 MED ORDER — MELOXICAM 7.5 MG PO TABS
15.0000 mg | ORAL_TABLET | Freq: Every day | ORAL | Status: DC | PRN
  Filled 2017-12-04: qty 2

## 2017-12-04 MED ORDER — FLUTICASONE PROPIONATE 50 MCG/ACT NA SUSP
1.0000 | Freq: Every day | NASAL | Status: DC | PRN
  Filled 2017-12-04: qty 16

## 2017-12-04 MED ORDER — BISACODYL 10 MG RE SUPP: 10.0000 mg | Freq: Every day | RECTAL | Status: DC | PRN

## 2017-12-04 MED ORDER — OXYCODONE-ACETAMINOPHEN 5-325 MG PO TABS
1.0000 | ORAL_TABLET | ORAL | Status: DC | PRN
  Filled 2017-12-04: qty 1

## 2017-12-04 NOTE — ED Provider Notes (Signed)
[facility] Emergency Department Provider Note       Time seen: ----------------------------------------- 9:34 AM on 12/04/2017 -----------------------------------------   I have reviewed the triage vital signs and the nursing notes.  HISTORY   Chief Complaint Pneumonia    HPI [name] is a 62 y.o. male with a history of cardiomyopathy, CHF, COPD, with a recent diagnosis of pneumonia who presents to the ED for shortness of breath.  Patient presents with shortness of breath, is currently taking antibiotics for pneumonia that he was diagnosed with recently.  He denies fevers, chills, does complain of shortness of breath and white productive cough.  He denies vomiting or diarrhea.  He uses inhalers this morning without any improvement.  Past Medical History:  Diagnosis Date  . Arthritis    knees, lower back  . Cardiomyopathy ([place])   . CHF (congestive heart failure) ([place])   . Chronic back pain   . Coronary artery disease   . Hepatitis C virus infection without hepatic coma    received treatment. clear now  . Hypertension   . Kidney stones     Patient Active Problem List   Diagnosis Date Noted  . Atherosclerosis of native arteries of extremity with intermittent claudication ([place]) 11/10/2017  . Leg pain 11/10/2017  . Hyperlipidemia 11/10/2017  . DJD (degenerative joint disease) 11/10/2017  . Essential hypertension 11/10/2017  . Loss of weight   . Polyp of sigmoid colon   . Gastritis without bleeding   . Kidney stone 04/16/2014  . Gross hematuria 04/16/2013  . Nephrolithiasis 04/16/2013    Past Surgical History:  Procedure Laterality Date  . BACK SURGERY    . CARDIAC CATHETERIZATION    . CHOLECYSTECTOMY    . COLONOSCOPY WITH PROPOFOL N/A 03/29/2017   Procedure: COLONOSCOPY WITH PROPOFOL;  Surgeon: Lucilla Lame, MD;  Location: [facility] ENDOSCOPY;  Service: Endoscopy;  Laterality: N/A;  . ESOPHAGOGASTRODUODENOSCOPY (EGD) WITH PROPOFOL N/A  03/29/2017   Procedure: ESOPHAGOGASTRODUODENOSCOPY (EGD) WITH PROPOFOL;  Surgeon: Lucilla Lame, MD;  Location: [facility] ENDOSCOPY;  Service: Endoscopy;  Laterality: N/A;  . HERNIA REPAIR    . LOWER EXTREMITY ANGIOGRAPHY Right 11/09/2017   Procedure: LOWER EXTREMITY ANGIOGRAPHY;  Surgeon: Katha Cabal, MD;  Location: [place] [facility];  Service: Cardiovascular;  Laterality: Right;  . SPINAL FUSION      Allergies Codeine  Social History Social History   Tobacco Use  . Smoking status: Former Smoker    Last attempt to quit: 2013    Years since quitting: 6.4  . Smokeless tobacco: Never Used  Substance Use Topics  . Alcohol use: No  . Drug use: No   Review of Systems Constitutional: Negative for fever. Cardiovascular: Negative for chest pain. Respiratory: Positive shortness of breath Gastrointestinal: Negative for abdominal pain, vomiting and diarrhea. Genitourinary: Negative for dysuria. Musculoskeletal: Negative for back pain. Skin: Negative for rash. Neurological: Positive for weakness  All systems negative/normal/unremarkable except as stated in the HPI  ____________________________________________   PHYSICAL EXAM:  VITAL SIGNS: ED Triage Vitals  Enc Vitals Group     BP 12/04/17 0909 127/64     Pulse Rate 12/04/17 0909 73     Resp 12/04/17 0909 (!) 24     Temp 12/04/17 0909 98.7 F (37.1 C)     Temp Source 12/04/17 0909 Oral     SpO2 12/04/17 0909 96 %     Weight 12/04/17 0910 150 lb (68 kg)     Height 12/04/17 0910 5\' 7"  (1.702  m)     Head Circumference --      Peak Flow --      Pain Score 12/04/17 0910 0     Pain Loc --      Pain Edu? --      Excl. in [place]? --    Constitutional: Alert and oriented.  Mild distress Eyes: Conjunctivae are normal. Normal extraocular movements. ENT   Head: Normocephalic and atraumatic.   Nose: No congestion/rhinnorhea.   Mouth/Throat: Mucous membranes are moist.   Neck: No stridor. Cardiovascular: Normal rate,  regular rhythm. No murmurs, rubs, or gallops. Respiratory: Mild tachypnea with wheezing and rhonchi bilaterally Gastrointestinal: Soft and nontender. Normal bowel sounds Musculoskeletal: Nontender with normal range of motion in extremities. No lower extremity tenderness nor edema. Neurologic:  Normal speech and language. No gross focal neurologic deficits are appreciated.  Skin:  Skin is warm, dry and intact. No rash noted. Psychiatric: Mood and affect are normal. Speech and behavior are normal.  ____________________________________________  EKG: Interpreted by me.  Sinus rhythm with PVCs, rate is 64 bpm, normal axis, possible septal infarct age-indeterminate, normal QT  ____________________________________________  ED COURSE:  As part of my medical decision making, I reviewed the following data within the [place] History obtained from family if available, nursing notes, old chart and ekg, as well as notes from prior ED visits. Patient presented for shortness of breath and likely COPD exacerbation although he has been recently diagnosed with pneumonia, we will assess with labs and imaging as indicated at this time.   Procedures ____________________________________________   LABS (pertinent positives/negatives)  Labs Reviewed  BLOOD GAS, VENOUS - Abnormal; Notable for the following components:      Result Value   pO2, Ven 60.0 (*)    Bicarbonate 29.6 (*)    Acid-Base Excess 3.3 (*)    All other components within normal limits  BRAIN NATRIURETIC PEPTIDE - Abnormal; Notable for the following components:   B Natriuretic Peptide 559.0 (*)    All other components within normal limits  CBC WITH DIFFERENTIAL/PLATELET - Abnormal; Notable for the following components:   WBC 11.6 (*)    RDW 15.3 (*)    Neutro Abs 8.4 (*)    All other components within normal limits  COMPREHENSIVE METABOLIC PANEL - Abnormal; Notable for the following components:   BUN 21 (*)    Total  Protein 6.4 (*)    ALT 11 (*)    All other components within normal limits  TROPONIN I   CRITICAL CARE Performed by: Laurence Aly   Total critical care time: 30 minutes  Critical care time was exclusive of separately billable procedures and treating other patients.  Critical care was necessary to treat or prevent imminent or life-threatening deterioration.  Critical care was time spent personally by me on the following activities: development of treatment plan with patient and/or surrogate as well as nursing, discussions with consultants, evaluation of patient's response to treatment, examination of patient, obtaining history from patient or surrogate, ordering and performing treatments and interventions, ordering and review of laboratory studies, ordering and review of radiographic studies, pulse oximetry and re-evaluation of patient's condition.  RADIOLOGY Images were viewed by me  Chest x-ray reveals no acute process ____________________________________________  DIFFERENTIAL DIAGNOSIS   COPD, pneumonia, PE, pneumothorax, CHF  FINAL ASSESSMENT AND PLAN  COPD exacerbation, hypoxia   Plan: The patient had presented for shortness of breath and cough despite recent treatment for pneumonia. Patient's labs were grossly unremarkable  for any acute process. Patient's imaging did not reveal pneumonia.  He was given 3 DuoNeb's as well as IV Solu-Medrol and after this, had undergone evaluation in the ER.  He was noted to be hypoxic at 87%.  Patient will require additional oxygen as well as steroids.  He has already completed a course of antibiotics.  I will discuss with the hospitalist for admission.   Laurence Aly, MD   Note: This note was generated in part or whole with voice recognition software. Voice recognition is usually quite accurate but there are transcription errors that can and very often do occur. I apologize for any typographical errors that were not  detected and corrected.     Earleen Newport, MD 12/04/17 [phone]

## 2017-12-04 NOTE — ED Notes (Signed)
First Nurse Note: Pt to ED c/o shortness of breath. Pt just finished treatment for double pneumonia. Breathing appears mildly labored at this time.

## 2017-12-04 NOTE — ED Notes (Signed)
Pt placed on 2L [place] at this time.

## 2017-12-04 NOTE — ED Triage Notes (Signed)
Pt reports diagnosed with Pneumonia Tuesday taking antibiotics reports not feeling better has history of COPD, using inhalers and nebulizer breathing not improving, pt talks in short sentences, pt's spouse talking for pt.

## 2017-12-04 NOTE — ED Notes (Signed)
ED Provider at bedside. 

## 2017-12-04 NOTE — H&P (Signed)
History and Physical    [name] SPQ:[id number] DOB: Apr 17, 1956 DOA: 12/04/2017  Referring physician: Dr. Jimmye Norman PCP: Perrin Maltese, MD  Specialists: none  Chief Complaint: SOB and cough  HPI: [name] is a 62 y.o. male has a past medical history significant for COPD, CAD, PVD, and HTN now with 1 week hx of progressive SOB and cough despite po steroids and po ABX given by PCP. Not on O2 at home. In ER, CXR negative for pneumonia but pt remains hypoxic and symptomatic despite IV steroids and SVN's. He is now admitted. No fever. Sputum is white. Denies CP. No N/V/D.  Review of Systems: The patient denies anorexia, fever, weight loss,, vision loss, decreased hearing, hoarseness, chest pain, syncope, peripheral edema, balance deficits, hemoptysis, abdominal pain, melena, hematochezia, severe indigestion/heartburn, hematuria, incontinence, genital sores, muscle weakness, suspicious skin lesions, transient blindness, difficulty walking, depression, unusual weight change, abnormal bleeding, enlarged lymph nodes, angioedema, and breast masses.   Past Medical History:  Diagnosis Date  . Arthritis    knees, lower back  . Cardiomyopathy ([place])   . CHF (congestive heart failure) ([place])   . Chronic back pain   . Coronary artery disease   . Hepatitis C virus infection without hepatic coma    received treatment. clear now  . Hypertension   . Kidney stones    Past Surgical History:  Procedure Laterality Date  . BACK SURGERY    . CARDIAC CATHETERIZATION    . CHOLECYSTECTOMY    . COLONOSCOPY WITH PROPOFOL N/A 03/29/2017   Procedure: COLONOSCOPY WITH PROPOFOL;  Surgeon: Lucilla Lame, MD;  Location: [facility] ENDOSCOPY;  Service: Endoscopy;  Laterality: N/A;  . ESOPHAGOGASTRODUODENOSCOPY (EGD) WITH PROPOFOL N/A 03/29/2017   Procedure: ESOPHAGOGASTRODUODENOSCOPY (EGD) WITH PROPOFOL;  Surgeon: Lucilla Lame, MD;  Location: [facility] ENDOSCOPY;  Service: Endoscopy;  Laterality: N/A;  . HERNIA REPAIR     . LOWER EXTREMITY ANGIOGRAPHY Right 11/09/2017   Procedure: LOWER EXTREMITY ANGIOGRAPHY;  Surgeon: Katha Cabal, MD;  Location: [place] CV LAB;  Service: Cardiovascular;  Laterality: Right;  . SPINAL FUSION     Social History:  reports that he quit smoking about 6 years ago. He has never used smokeless tobacco. He reports that he does not drink alcohol or use drugs.  Allergies  Allergen Reactions  . Codeine Hives and Itching    Family History  Problem Relation Age of Onset  . COPD Mother     Prior to Admission medications   Medication Sig Start Date End Date Taking? Authorizing Provider  ALPRAZolam Duanne Moron) 1 MG tablet Take 1 mg by mouth 3 (three) times daily. 01/06/16   [provider]  aspirin (GOODSENSE ASPIRIN) 325 MG tablet Take 325 mg by mouth daily.     [provider]  carvedilol (COREG) 25 MG tablet Take 25 mg by mouth 2 (two) times daily.     [provider]  clopidogrel (PLAVIX) 75 MG tablet Take 1 tablet (75 mg total) by mouth daily. 11/09/17   Schnier, Dolores Lory, MD  ENTRESTO 49-51 MG Take 1 tablet by mouth 2 (two) times daily. 10/03/17   [provider]  fluticasone (FLONASE) 50 MCG/ACT nasal spray Place 1 spray into both nostrils daily as needed for allergies.  01/20/17   [provider]  furosemide (LASIX) 20 MG tablet Take 20 mg by mouth daily as needed for fluid.     [provider]  meloxicam (MOBIC) 15 MG tablet Take 15 mg by mouth  daily as needed (for inflammation.).     [provider]  oxyCODONE-acetaminophen (PERCOCET) 10-325 MG tablet Take 1 tablet by mouth 4 (four) times daily.     [provider]  simvastatin (ZOCOR) 40 MG tablet Take 40 mg by mouth daily.  07/24/16   [provider]   Physical Exam: Vitals:   12/04/17 0910 12/04/17 0930 12/04/17 1000 12/04/17 1049  BP:      Pulse:  73 63 74  Resp:  19 15 15   Temp:      TempSrc:      SpO2:  97% 98% (!) 88%  Weight: 68  kg (150 lb)     Height: 5\' 7"  (1.702 m)        General:  WDWN, [place]/AT in moderate distress  Eyes: PERRL, EOMI, no scleral icterus, conjunctiva clear  ENT: moist oropharynx without exudate, TM's benign, dentition fair  Neck: supple, no lymphadenopathy. No bruits or thyromegaly  Cardiovascular: regular rate without MRG; 2+ peripheral pulses, no JVD, no peripheral edema  Respiratory: decreased breath sounds with diffuse wheezing and rhonchi. No dullness or rales. Respiratory effort increased  Abdomen: soft, non tender to palpation, positive bowel sounds, no guarding, no rebound  Skin: no rashes or lesions  Musculoskeletal: normal bulk and tone, no joint swelling  Psychiatric: normal mood and affect, A&OX3  Neurologic: CN 2-12 grossly intact, Motor strength 5/5 in all 4 groups with symmetric DTR's and non-focal sensory exam  Labs on Admission:  Basic Metabolic Panel: Recent Labs  Lab 12/04/17 0912  NA 138  K 3.8  CL 104  CO2 26  GLUCOSE 94  BUN 21*  CREATININE 1.22  CALCIUM 8.9   Liver Function Tests: Recent Labs  Lab 12/04/17 0912  AST 18  ALT 11*  ALKPHOS 45  BILITOT 0.7  PROT 6.4*  ALBUMIN 3.5   No results for input(s): LIPASE, AMYLASE in the last 168 hours. No results for input(s): AMMONIA in the last 168 hours. CBC: Recent Labs  Lab 12/04/17 0912  WBC 11.6*  NEUTROABS 8.4*  HGB 14.1  HCT 43.3  MCV 81.2  PLT 291   Cardiac Enzymes: Recent Labs  Lab 12/04/17 0912  TROPONINI <0.03    BNP (last 3 results) Recent Labs    12/04/17 0912  BNP 559.0*    ProBNP (last 3 results) No results for input(s): PROBNP in the last 8760 hours.  CBG: No results for input(s): GLUCAP in the last 168 hours.  Radiological Exams on Admission: Dg Chest 2 View  Result Date: 12/04/2017 CLINICAL DATA:  Shortness of breath. EXAM: CHEST - 2 VIEW COMPARISON:  05/06/2013 FINDINGS: Normal heart size. No pleural effusion or edema. No airspace opacities identified.  The visualized osseous structures are unremarkable. IMPRESSION: 1. No acute cardiopulmonary abnormalities. Electronically Signed   By: Kerby Moors M.D.   On: 12/04/2017 09:33    EKG: Independently reviewed.  Assessment/Plan Principal Problem:   Acute respiratory failure with hypoxia (HCC) Active Problems:   Essential hypertension   COPD exacerbation (HCC)   Acute bronchitis   Will admit to floor with O2, IV fluids, IV ABX, and IV steroids. SVN's QID and prn. Wean O2 as tolerated. Consult Pulmonology if not better. Repeat labs in AM  Diet: low salt Fluids: NS@75  DVT Prophylaxis: SQ Heparin  Code Status: FULL  Family Communication: yes  Disposition Plan: home  Time spent: 50 min

## 2017-12-05 LAB — CBC
HCT: 42.4 % (ref 40.0–52.0)
HEMOGLOBIN: 13.7 g/dL (ref 13.0–18.0)
MCH: 25.9 pg — AB (ref 26.0–34.0)
MCHC: 32.2 g/dL (ref 32.0–36.0)
MCV: 80.5 fL (ref 80.0–100.0)
Platelets: 262 10*3/uL (ref 150–440)
RBC: 5.27 MIL/uL (ref 4.40–5.90)
RDW: 15.1 % — ABNORMAL HIGH (ref 11.5–14.5)
WBC: 17.9 10*3/uL — ABNORMAL HIGH (ref 3.8–10.6)

## 2017-12-05 LAB — COMPREHENSIVE METABOLIC PANEL
ALT: 10 U/L — ABNORMAL LOW (ref 17–63)
ANION GAP: 8 (ref 5–15)
AST: 16 U/L (ref 15–41)
Albumin: 3.2 g/dL — ABNORMAL LOW (ref 3.5–5.0)
Alkaline Phosphatase: 45 U/L (ref 38–126)
BUN: 23 mg/dL — ABNORMAL HIGH (ref 6–20)
CALCIUM: 8.4 mg/dL — AB (ref 8.9–10.3)
CHLORIDE: 102 mmol/L (ref 101–111)
CO2: 27 mmol/L (ref 22–32)
Creatinine, Ser: 1.25 mg/dL — ABNORMAL HIGH (ref 0.61–1.24)
GFR calc non Af Amer: >60 mL/min (ref >60)
Glucose, Bld: 195 mg/dL — ABNORMAL HIGH (ref 65–99)
POTASSIUM: 3.8 mmol/L (ref 3.5–5.1)
Sodium: 137 mmol/L (ref 135–145)
Total Bilirubin: 0.8 mg/dL (ref 0.3–1.2)
Total Protein: 6 g/dL — ABNORMAL LOW (ref 6.5–8.1)

## 2017-12-05 LAB — GLUCOSE, CAPILLARY: GLUCOSE-CAPILLARY: 160 mg/dL — AB (ref 65–99)

## 2017-12-05 MED ORDER — TIOTROPIUM BROMIDE MONOHYDRATE 18 MCG IN CAPS: 18.0000 ug | ORAL_CAPSULE | Freq: Every morning | RESPIRATORY_TRACT | 0 refills | Status: DC

## 2017-12-05 MED ORDER — PREDNISONE 10 MG (21) PO TBPK: ORAL_TABLET | ORAL | 0 refills | Status: DC

## 2017-12-05 MED ORDER — AZITHROMYCIN 500 MG IV SOLR
500.0000 mg | INTRAVENOUS | Status: DC
  Administered 2017-12-05: 500 mg via INTRAVENOUS
  Filled 2017-12-05: qty 500

## 2017-12-05 MED ORDER — FAMOTIDINE 20 MG PO TABS: 20.0000 mg | ORAL_TABLET | Freq: Two times a day (BID) | ORAL | Status: DC

## 2017-12-05 MED ORDER — HYDROCOD POLST-CPM POLST ER 10-8 MG/5ML PO SUER: 5.0000 mL | Freq: Two times a day (BID) | ORAL | 0 refills | Status: DC | PRN

## 2017-12-05 MED ORDER — AZITHROMYCIN 250 MG PO TABS: 250.0000 mg | ORAL_TABLET | Freq: Every day | ORAL | 0 refills | Status: AC

## 2017-12-05 MED ORDER — IPRATROPIUM-ALBUTEROL 0.5-2.5 (3) MG/3ML IN SOLN: 3.0000 mL | RESPIRATORY_TRACT | 0 refills | Status: AC | PRN

## 2017-12-05 NOTE — Care Management (Signed)
Per MD patient has nebulizer in the home.  Patient has been weaned to RA.  Staff to check ambulating O2 sats prior to discharge.  No RNCM needs identified.

## 2017-12-05 NOTE — Care Management CC44 (Signed)
Condition Code 44 Documentation Completed  Patient Details  Name: [name] MRN: [id number] Date of Birth: Oct 30, 1955   Condition Code 44 given:  Yes Patient signature on Condition Code 44 notice:  Yes Documentation of 2 MD's agreement:  Yes Code 44 added to claim:  Yes    Beverly Sessions, RN 12/05/2017, 12:00 PM

## 2017-12-05 NOTE — Discharge Summary (Signed)
[facility] [place] NAME: [name]    MR#:  [id number]  DATE OF BIRTH:  March 08, 1956  DATE OF ADMISSION:  12/04/2017 ADMITTING PHYSICIAN: Idelle Crouch, MD  DATE OF DISCHARGE: 12/05/2017   PRIMARY CARE PHYSICIAN: Perrin Maltese, MD    ADMISSION DIAGNOSIS:  Hypoxia [R09.02] Chronic obstructive pulmonary disease with acute exacerbation (HCC) [J44.1]  DISCHARGE DIAGNOSIS:  Principal Problem:   Acute respiratory failure with hypoxia (HCC) Active Problems:   Essential hypertension   COPD exacerbation (HCC)   Acute bronchitis   SECONDARY DIAGNOSIS:   Past Medical History:  Diagnosis Date  . Arthritis    knees, lower back  . Cardiomyopathy ([place])   . CHF (congestive heart failure) ([place])   . Chronic back pain   . Coronary artery disease   . Hepatitis C virus infection without hepatic coma    received treatment. clear now  . Hypertension   . Kidney stones     HOSPITAL COURSE:   Pt was given Steroids and Abx for 1 week by PMD, but cont to be hypoxic and SOB with cough so came here. After receiving IV steroids and IV abx- he had significant improvement and able to walk in hallway without any oxygen needs or wheezing.  DISCHARGE CONDITIONS:   Stable.  CONSULTS OBTAINED:    DRUG ALLERGIES:   Allergies  Allergen Reactions  . Codeine Hives and Itching    DISCHARGE MEDICATIONS:   Allergies as of 12/05/2017      Reactions   Codeine Hives, Itching      Medication List    STOP taking these medications   amoxicillin-clavulanate 875-125 MG tablet Commonly known as:  AUGMENTIN     TAKE these medications   ALPRAZolam 1 MG tablet Commonly known as:  XANAX Take 1 mg by mouth 3 (three) times daily.   azithromycin 250 MG tablet Commonly known as:  ZITHROMAX Z-PAK Take 1 tablet (250 mg total) by mouth daily for 3 days.   carvedilol 25 MG tablet Commonly known as:  COREG Take 25 mg by mouth 2 (two) times  daily.   chlorpheniramine-HYDROcodone 10-8 MG/5ML Suer Commonly known as:  TUSSIONEX Take 5 mLs by mouth every 12 (twelve) hours as needed for cough.   clopidogrel 75 MG tablet Commonly known as:  PLAVIX Take 1 tablet (75 mg total) by mouth daily.   ENTRESTO 49-51 MG Generic drug:  sacubitril-valsartan Take 1 tablet by mouth 2 (two) times daily.   fluticasone 50 MCG/ACT nasal spray Commonly known as:  FLONASE Place 1 spray into both nostrils daily as needed for allergies.   furosemide 20 MG tablet Commonly known as:  LASIX Take 20 mg by mouth daily as needed for fluid.   ipratropium-albuterol 0.5-2.5 (3) MG/3ML Soln Commonly known as:  DUONEB Take 3 mLs by nebulization every 4 (four) hours as needed (for SOB).   meloxicam 15 MG tablet Commonly known as:  MOBIC Take 15 mg by mouth daily as needed (for inflammation.).   oxyCODONE-acetaminophen 10-325 MG tablet Commonly known as:  PERCOCET Take 1 tablet by mouth 5 (five) times daily.   predniSONE 10 MG (21) Tbpk tablet Commonly known as:  STERAPRED UNI-PAK 21 TAB Take 6 tabs first day, 5 tab on day 2, then 4 on day 3rd, 3 tabs on day 4th , 2 tab on day 5th, and 1 tab on 6th day.   simvastatin 40 MG tablet Commonly known as:  ZOCOR Take  40 mg by mouth daily.   tiotropium 18 MCG inhalation capsule Commonly known as:  SPIRIVA Place 1 capsule (18 mcg total) into inhaler and inhale every morning. Start taking on:  12/06/2017        DISCHARGE INSTRUCTIONS:    Follow with PMD in 1 week.  If you experience worsening of your admission symptoms, develop shortness of breath, life threatening emergency, suicidal or homicidal thoughts you must seek medical attention immediately by calling 911 or calling your MD immediately  if symptoms less severe.  You Must read complete instructions/literature along with all the possible adverse reactions/side effects for all the Medicines you take and that have been prescribed to you. Take  any new Medicines after you have completely understood and accept all the possible adverse reactions/side effects.   Please note  You were cared for by a hospitalist during your hospital stay. If you have any questions about your discharge medications or the care you received while you were in the hospital after you are discharged, you can call the unit and asked to speak with the hospitalist on call if the hospitalist that took care of you is not available. Once you are discharged, your primary care physician will handle any further medical issues. Please note that NO REFILLS for any discharge medications will be authorized once you are discharged, as it is imperative that you return to your primary care physician (or establish a relationship with a primary care physician if you do not have one) for your aftercare needs so that they can reassess your need for medications and monitor your lab values.    Today   CHIEF COMPLAINT:   Chief Complaint  Patient presents with  . Pneumonia    HISTORY OF PRESENT ILLNESS:  [name]  is a 62 y.o. male with a known history of COPD, CAD, PVD, and HTN now with 1 week hx of progressive SOB and cough despite po steroids and po ABX given by PCP. Not on O2 at home. In ER, CXR negative for pneumonia but pt remains hypoxic and symptomatic despite IV steroids and SVN's. He is now admitted. No fever. Sputum is white. Denies CP. No N/V/D.   VITAL SIGNS:  Blood pressure 126/66, pulse 64, temperature 97.8 F (36.6 C), temperature source Oral, resp. rate 16, height 5\' 7"  (1.702 m), weight 70.7 kg (155 lb 14.4 oz), SpO2 95 %.  I/O:    Intake/Output Summary (Last 24 hours) at 12/05/2017 0952 Last data filed at 12/05/2017 0839 Gross per 24 hour  Intake 2658.75 ml  Output 1500 ml  Net 1158.75 ml    PHYSICAL EXAMINATION:  GENERAL:  62 y.o.-year-old patient lying in the bed with no acute distress.  EYES: Pupils equal, round, reactive to light and  accommodation. No scleral icterus. Extraocular muscles intact.  HEENT: Head atraumatic, normocephalic. Oropharynx and nasopharynx clear.  NECK:  Supple, no jugular venous distention. No thyroid enlargement, no tenderness.  LUNGS: Normal breath sounds bilaterally, no wheezing, rales,rhonchi or crepitation. No use of accessory muscles of respiration.  CARDIOVASCULAR: S1, S2 normal. No murmurs, rubs, or gallops.  ABDOMEN: Soft, non-tender, non-distended. Bowel sounds present. No organomegaly or mass.  EXTREMITIES: No pedal edema, cyanosis, or clubbing.  NEUROLOGIC: Cranial nerves II through XII are intact. Muscle strength 5/5 in all extremities. Sensation intact. Gait not checked.  PSYCHIATRIC: The patient is alert and oriented x 3.  SKIN: No obvious rash, lesion, or ulcer.   DATA REVIEW:   CBC Recent Labs  Lab 12/05/17 0431  WBC 17.9*  HGB 13.7  HCT 42.4  PLT 262    Chemistries  Recent Labs  Lab 12/05/17 0431  NA 137  K 3.8  CL 102  CO2 27  GLUCOSE 195*  BUN 23*  CREATININE 1.25*  CALCIUM 8.4*  AST 16  ALT 10*  ALKPHOS 45  BILITOT 0.8    Cardiac Enzymes Recent Labs  Lab 12/04/17 0912  TROPONINI <0.03    Microbiology Results  No results found for this or any previous visit.  RADIOLOGY:  Dg Chest 2 View  Result Date: 12/04/2017 CLINICAL DATA:  Shortness of breath. EXAM: CHEST - 2 VIEW COMPARISON:  05/06/2013 FINDINGS: Normal heart size. No pleural effusion or edema. No airspace opacities identified. The visualized osseous structures are unremarkable. IMPRESSION: 1. No acute cardiopulmonary abnormalities. Electronically Signed   By: Kerby Moors M.D.   On: 12/04/2017 09:33    EKG:   Orders placed or performed during the hospital encounter of 12/04/17  . EKG 12-Lead  . EKG 12-Lead  . ED EKG  . ED EKG      Management plans discussed with the patient, family and they are in agreement.  CODE STATUS:     Code Status Orders  (From admission, onward)         Start     Ordered   12/04/17 1218  Full code  Continuous     12/04/17 1217    Code Status History    This patient has a current code status but no historical code status.      TOTAL TIME TAKING CARE OF THIS PATIENT: 35 minutes.    Vaughan Basta M.D on 12/05/2017 at 9:52 AM  Between 7am to 6pm - Pager - [phone]  After 6pm go to [email or web address] - password [email or web address] [facility]  Office  [phone]  CC: Primary care physician; Perrin Maltese, MD   Note: This dictation was prepared with Dragon dictation along with smaller phrase technology. Any transcriptional errors that result from this process are unintentional.

## 2017-12-05 NOTE — Progress Notes (Signed)
SATURATION QUALIFICATIONS: (This note is used to comply with regulatory documentation for home oxygen)  Patient Saturations on Room Air at Rest = 97%  Patient Saturations on Room Air while Ambulating = 93%  Patient Saturations on 0Liters of oxygen while Ambulating = 93%  Please briefly explain why patient needs home oxygen: no needs

## 2017-12-05 NOTE — Care Management Obs Status (Signed)
[place] NOTIFICATION   Patient Details  Name: [name] MRN: [id number] Date of Birth: 11/10/1955   Medicare Observation Status Notification Given:  Yes    Beverly Sessions, RN 12/05/2017, 12:00 PM

## 2017-12-05 NOTE — Progress Notes (Signed)
[name] to be D/C'd home  per MD order.  Discussed prescriptions and follow up appointments with the patient. Prescriptions given to patient, medication list explained in detail. Pt verbalized understanding.  Allergies as of 12/05/2017      Reactions   Codeine Hives, Itching      Medication List    STOP taking these medications   amoxicillin-clavulanate 875-125 MG tablet Commonly known as:  AUGMENTIN     TAKE these medications   ALPRAZolam 1 MG tablet Commonly known as:  XANAX Take 1 mg by mouth 3 (three) times daily.   azithromycin 250 MG tablet Commonly known as:  ZITHROMAX Z-PAK Take 1 tablet (250 mg total) by mouth daily for 3 days.   carvedilol 25 MG tablet Commonly known as:  COREG Take 25 mg by mouth 2 (two) times daily.   chlorpheniramine-HYDROcodone 10-8 MG/5ML Suer Commonly known as:  TUSSIONEX Take 5 mLs by mouth every 12 (twelve) hours as needed for cough.   clopidogrel 75 MG tablet Commonly known as:  PLAVIX Take 1 tablet (75 mg total) by mouth daily.   ENTRESTO 49-51 MG Generic drug:  sacubitril-valsartan Take 1 tablet by mouth 2 (two) times daily.   fluticasone 50 MCG/ACT nasal spray Commonly known as:  FLONASE Place 1 spray into both nostrils daily as needed for allergies.   furosemide 20 MG tablet Commonly known as:  LASIX Take 20 mg by mouth daily as needed for fluid.   ipratropium-albuterol 0.5-2.5 (3) MG/3ML Soln Commonly known as:  DUONEB Take 3 mLs by nebulization every 4 (four) hours as needed (for SOB).   meloxicam 15 MG tablet Commonly known as:  MOBIC Take 15 mg by mouth daily as needed (for inflammation.).   oxyCODONE-acetaminophen 10-325 MG tablet Commonly known as:  PERCOCET Take 1 tablet by mouth 5 (five) times daily.   predniSONE 10 MG (21) Tbpk tablet Commonly known as:  STERAPRED UNI-PAK 21 TAB Take 6 tabs first day, 5 tab on day 2, then 4 on day 3rd, 3 tabs on day 4th , 2 tab on day 5th, and 1 tab on 6th day.    simvastatin 40 MG tablet Commonly known as:  ZOCOR Take 40 mg by mouth daily.   tiotropium 18 MCG inhalation capsule Commonly known as:  SPIRIVA Place 1 capsule (18 mcg total) into inhaler and inhale every morning. Start taking on:  12/06/2017       Vitals:   12/04/17 2139 12/05/17 0423  BP: 135/69 126/66  Pulse: 66 64  Resp: 16 16  Temp: 97.9 F (36.6 C) 97.8 F (36.6 C)  SpO2: 96% 95%    Skin clean, dry and intact without evidence of skin break down, no evidence of skin tears noted. IV catheter discontinued intact. Site without signs and symptoms of complications. Dressing and pressure applied. Pt denies pain at this time. No complaints noted.  An After Visit Summary was printed and given to the patient. Patient escorted via [place], and D/C home via private auto.  Jenavi Beedle A

## 2017-12-05 NOTE — Progress Notes (Signed)
PHARMACIST - PHYSICIAN COMMUNICATION  CONCERNING: IV to Oral Route Change Policy  RECOMMENDATION: This patient is receiving famotidine by the intravenous route.  Based on criteria approved by the Pharmacy and Therapeutics Committee, the intravenous medication(s) is/are being converted to the equivalent oral dose form(s).   DESCRIPTION: These criteria include:  The patient is eating (either orally or via tube) and/or has been taking other orally administered medications for a least 24 hours  The patient has no evidence of active gastrointestinal bleeding or impaired GI absorption (gastrectomy, short bowel, patient on TNA or NPO).  If you have questions about this conversion, please contact the Pharmacy Department  []   [phone] )  [facility] [x]   [phone] )  [facility] []   [phone] )  [facility] []   [phone] )  [facility] []   [phone] )  [place], [facility] 12/05/2017 9:08 AM

## 2017-12-06 LAB — HIV ANTIBODY (ROUTINE TESTING W REFLEX): HIV SCREEN 4TH GENERATION: NONREACTIVE

## 2017-12-08 ENCOUNTER — Telehealth: Payer: Self-pay

## 2017-12-08 ENCOUNTER — Ambulatory Visit (INDEPENDENT_AMBULATORY_CARE_PROVIDER_SITE_OTHER): Payer: Medicare Other | Admitting: Vascular Surgery

## 2017-12-08 ENCOUNTER — Encounter (INDEPENDENT_AMBULATORY_CARE_PROVIDER_SITE_OTHER): Payer: Medicare Other

## 2017-12-08 NOTE — Telephone Encounter (Signed)
EMMI Follow-up: Noted on the report that patient had other questions/problems.  Called and talked with [name] and he had no concerns today.  He has made his follow-up appointment with Dr. Humphrey Rolls.  Explained there would be another automated call and to let us know if he had any concerns at that time.

## 2018-02-24 DIAGNOSIS — G894 Chronic pain syndrome: Secondary | ICD-10-CM | POA: Insufficient documentation

## 2018-08-16 ENCOUNTER — Other Ambulatory Visit: Payer: Self-pay | Admitting: Sports Medicine

## 2018-08-16 DIAGNOSIS — M7541 Impingement syndrome of right shoulder: Secondary | ICD-10-CM

## 2018-08-16 DIAGNOSIS — G8929 Other chronic pain: Secondary | ICD-10-CM

## 2018-08-16 DIAGNOSIS — M25511 Pain in right shoulder: Principal | ICD-10-CM

## 2018-08-16 DIAGNOSIS — M25512 Pain in left shoulder: Principal | ICD-10-CM

## 2018-08-26 ENCOUNTER — Ambulatory Visit
Admission: RE | Admit: 2018-08-26 | Discharge: 2018-08-26 | Disposition: A | Discharge status: Home / Self Care | Payer: Medicare Other | Source: Ambulatory Visit | Attending: Sports Medicine | Admitting: Sports Medicine

## 2018-08-26 DIAGNOSIS — G8929 Other chronic pain: Secondary | ICD-10-CM | POA: Diagnosis present

## 2018-08-26 DIAGNOSIS — M25511 Pain in right shoulder: Secondary | ICD-10-CM | POA: Diagnosis not present

## 2018-08-26 DIAGNOSIS — M7541 Impingement syndrome of right shoulder: Secondary | ICD-10-CM | POA: Diagnosis present

## 2018-08-26 DIAGNOSIS — M25512 Pain in left shoulder: Secondary | ICD-10-CM | POA: Insufficient documentation

## 2018-09-08 ENCOUNTER — Encounter (INDEPENDENT_AMBULATORY_CARE_PROVIDER_SITE_OTHER): Payer: Self-pay

## 2018-09-20 DIAGNOSIS — R7303 Prediabetes: Secondary | ICD-10-CM | POA: Insufficient documentation

## 2018-11-07 ENCOUNTER — Other Ambulatory Visit (INDEPENDENT_AMBULATORY_CARE_PROVIDER_SITE_OTHER): Payer: Self-pay | Admitting: Vascular Surgery

## 2018-11-07 DIAGNOSIS — Z9862 Peripheral vascular angioplasty status: Secondary | ICD-10-CM

## 2018-11-13 ENCOUNTER — Telehealth (INDEPENDENT_AMBULATORY_CARE_PROVIDER_SITE_OTHER): Payer: Self-pay

## 2018-11-13 ENCOUNTER — Other Ambulatory Visit (INDEPENDENT_AMBULATORY_CARE_PROVIDER_SITE_OTHER): Payer: Self-pay | Admitting: Nurse Practitioner

## 2018-11-13 ENCOUNTER — Ambulatory Visit (INDEPENDENT_AMBULATORY_CARE_PROVIDER_SITE_OTHER): Payer: Medicare Other | Admitting: Nurse Practitioner

## 2018-11-13 ENCOUNTER — Ambulatory Visit (INDEPENDENT_AMBULATORY_CARE_PROVIDER_SITE_OTHER): Payer: Medicare Other

## 2018-11-13 ENCOUNTER — Encounter (INDEPENDENT_AMBULATORY_CARE_PROVIDER_SITE_OTHER): Payer: Self-pay

## 2018-11-13 ENCOUNTER — Other Ambulatory Visit: Payer: Self-pay

## 2018-11-13 ENCOUNTER — Encounter (INDEPENDENT_AMBULATORY_CARE_PROVIDER_SITE_OTHER): Payer: Self-pay | Admitting: Nurse Practitioner

## 2018-11-13 VITALS — BP 129/61 | HR 65 | Resp 12 | Ht 67.0 in | Wt 141.0 lb

## 2018-11-13 DIAGNOSIS — Z79899 Other long term (current) drug therapy: Secondary | ICD-10-CM

## 2018-11-13 DIAGNOSIS — Z9862 Peripheral vascular angioplasty status: Secondary | ICD-10-CM

## 2018-11-13 DIAGNOSIS — Z87891 Personal history of nicotine dependence: Secondary | ICD-10-CM

## 2018-11-13 DIAGNOSIS — J441 Chronic obstructive pulmonary disease with (acute) exacerbation: Secondary | ICD-10-CM | POA: Diagnosis not present

## 2018-11-13 DIAGNOSIS — I70211 Atherosclerosis of native arteries of extremities with intermittent claudication, right leg: Secondary | ICD-10-CM | POA: Diagnosis not present

## 2018-11-13 DIAGNOSIS — I739 Peripheral vascular disease, unspecified: Secondary | ICD-10-CM

## 2018-11-13 DIAGNOSIS — M1991 Primary osteoarthritis, unspecified site: Secondary | ICD-10-CM

## 2018-11-13 DIAGNOSIS — I10 Essential (primary) hypertension: Secondary | ICD-10-CM | POA: Diagnosis not present

## 2018-11-13 NOTE — Progress Notes (Signed)
SUBJECTIVE:  Patient ID: [name], male    DOB: 16-Jun-1956, 63 y.o.   MRN: [id number] Chief Complaint  Patient presents with  . Follow-up    HPI  [name] is a 63 y.o. male The patient returns to the office for followup and review of the noninvasive studies. There has been a significant deterioration in the lower extremity symptoms.  The patient notes interval shortening of their claudication distance and development of mild rest pain symptoms. No new ulcers or wounds have occurred since the last visit.  The patient underwent an angiogram of the right lower extremity on 11/03/2017, however he did not follow up after the procedure.    There have been no significant changes to the patient's overall health care.  The patient denies amaurosis fugax or recent TIA symptoms. There are no recent neurological changes noted. The patient denies history of DVT, PE or superficial thrombophlebitis. The patient denies recent episodes of angina or shortness of breath.   ABI's Rt=0.53 and Lt=1.00 (non-previous  Duplex US of the lower extremity arterial system shows monophasic waveforms of the right tibial arteries with dampened toe waveforms and triphasic waveforms of the left tibial arteries with strong toe waveforms.    Past Medical History:  Diagnosis Date  . Arthritis    knees, lower back  . Cardiomyopathy ([place])   . CHF (congestive heart failure) ([place])   . Chronic back pain   . Coronary artery disease   . Hepatitis C virus infection without hepatic coma    received treatment. clear now  . Hypertension   . Kidney stones     Past Surgical History:  Procedure Laterality Date  . BACK SURGERY    . CARDIAC CATHETERIZATION    . CHOLECYSTECTOMY    . COLONOSCOPY WITH PROPOFOL N/A 03/29/2017   Procedure: COLONOSCOPY WITH PROPOFOL;  Surgeon: Lucilla Lame, MD;  Location: [facility] ENDOSCOPY;  Service: Endoscopy;  Laterality: N/A;  . ESOPHAGOGASTRODUODENOSCOPY (EGD) WITH PROPOFOL N/A  03/29/2017   Procedure: ESOPHAGOGASTRODUODENOSCOPY (EGD) WITH PROPOFOL;  Surgeon: Lucilla Lame, MD;  Location: [facility] ENDOSCOPY;  Service: Endoscopy;  Laterality: N/A;  . HERNIA REPAIR    . LOWER EXTREMITY ANGIOGRAPHY Right 11/09/2017   Procedure: LOWER EXTREMITY ANGIOGRAPHY;  Surgeon: Katha Cabal, MD;  Location: [place];  Service: Cardiovascular;  Laterality: Right;  . SPINAL FUSION      Social History   Socioeconomic History  . Marital status: Married    Spouse name: Not on file  . Number of children: Not on file  . Years of education: Not on file  . Highest education level: Not on file  Occupational History  . Not on file  Social Needs  . Financial resource strain: Not on file  . Food insecurity:    Worry: Not on file    Inability: Not on file  . Transportation needs:    Medical: Not on file    Non-medical: Not on file  Tobacco Use  . Smoking status: Former Smoker    Last attempt to quit: 2013    Years since quitting: 7.3  . Smokeless tobacco: Never Used  Substance and Sexual Activity  . Alcohol use: No  . Drug use: No  . Sexual activity: Not on file  Lifestyle  . Physical activity:    Days per week: Not on file    Minutes per session: Not on file  . Stress: Not on file  Relationships  . Social connections:    Talks  on phone: Not on file    Gets together: Not on file    Attends religious service: Not on file    Active member of club or organization: Not on file    Attends meetings of clubs or organizations: Not on file    Relationship status: Not on file  . Intimate partner violence:    Fear of current or ex partner: Not on file    Emotionally abused: Not on file    Physically abused: Not on file    Forced sexual activity: Not on file  Other Topics Concern  . Not on file  Social History Narrative  . Not on file    Family History  Problem Relation Age of Onset  . COPD Mother     Allergies  Allergen Reactions  . Codeine Hives and  Itching     Review of Systems   Review of Systems: Negative Unless Checked Constitutional: [] Weight loss  [] Fever  [] Chills Cardiac: [] Chest pain   []  Atrial Fibrillation  [] Palpitations   [] Shortness of breath when laying flat   [] Shortness of breath with exertion. [] Shortness of breath at rest Vascular:  [x] Pain in legs with walking   [] Pain in legs with standing [] Pain in legs when laying flat   [x] Claudication    [x] Pain in feet when laying flat    [] History of DVT   [] Phlebitis   [] Swelling in legs   [] Varicose veins   [] Non-healing ulcers Pulmonary:   [] Uses home oxygen   [] Productive cough   [] Hemoptysis   [] Wheeze  [x] COPD   [] Asthma Neurologic:  [] Dizziness   [] Seizures  [] Blackouts [] History of stroke   [] History of TIA  [] Aphasia   [] Temporary Blindness   [] Weakness or numbness in arm   [x] Weakness or numbness in leg Musculoskeletal:   [] Joint swelling   [] Joint pain   [] Low back pain  []  History of Knee Replacement [] Arthritis [] back Surgeries  []  Spinal Stenosis    Hematologic:  [] Easy bruising  [] Easy bleeding   [] Hypercoagulable state   [] Anemic Gastrointestinal:  [] Diarrhea   [] Vomiting  [] Gastroesophageal reflux/heartburn   [] Difficulty swallowing. [] Abdominal pain Genitourinary:  [] Chronic kidney disease   [] Difficult urination  [] Anuric   [] Blood in urine [] Frequent urination  [] Burning with urination   [] Hematuria Skin:  [] Rashes   [] Ulcers [] Wounds Psychological:  [] History of anxiety   []  History of major depression  []  Memory Difficulties      OBJECTIVE:   Physical Exam  BP 129/61 (BP Location: Left Arm, Patient Position: Sitting, Cuff Size: Small)   Pulse 65   Resp 12   Ht 5\' 7"  (1.702 m)   Wt 141 lb (64 kg)   BMI 22.08 kg/m   Gen: WD/WN, NAD Head: [place]/AT, No temporalis wasting.  Ear/Nose/Throat: Hearing grossly intact, nares w/o erythema or drainage Eyes: PER, EOMI, sclera nonicteric.  Neck: Supple, no masses.  No JVD.  Pulmonary:  Good air movement, no  use of accessory muscles.  Cardiac: RRR Vascular:  Vessel Right Left  Radial Palpable Palpable  Dorsalis Pedis Non Palpable Palpable  Posterior Tibial Non Palpable Palpable   Gastrointestinal: soft, non-distended. No guarding/no peritoneal signs.  Musculoskeletal: M/S 5/5 throughout.  No deformity or atrophy.  Neurologic: Pain and light touch intact in extremities.  Symmetrical.  Speech is fluent. Motor exam as listed above. Psychiatric: Judgment intact, Mood & affect appropriate for pt's clinical situation. Dermatologic: No Venous rashes. No Ulcers Noted.  No changes consistent with cellulitis. Lymph : No Cervical  lymphadenopathy, no lichenification or skin changes of chronic lymphedema.       ASSESSMENT AND PLAN:  1. Atherosclerosis of native artery of right lower extremity with intermittent claudication (HCC) Recommend:  The patient has evidence of severe atherosclerotic changes of the right lower extremity with rest pain that is associated with preulcerative changes and impending tissue loss of the foot.  This represents a limb threatening ischemia and places the patient at the risk for limb loss.  Patient should undergo angiography of the right lower extremity with the hope for intervention for limb salvage.  The risks and benefits as well as the alternative therapies was discussed in detail with the patient.  All questions were answered.  Patient agrees to proceed with angiography.  The patient will follow up with me in the office after the procedure.       2. Essential hypertension Continue antihypertensive medications as already ordered, these medications have been reviewed and there are no changes at this time.   3. COPD exacerbation ([place]) Continue pulmonary medications and aerosols as already ordered, these medications have been reviewed and there are no changes at this time.    4. Primary osteoarthritis, unspecified site Continue NSAID medications as already  ordered, these medications have been reviewed and there are no changes at this time.  Continued activity and therapy was stressed.    Current Outpatient Medications on File Prior to Visit  Medication Sig Dispense Refill  . ALPRAZolam (XANAX) 1 MG tablet Take 1 mg by mouth 3 (three) times daily.  5  . carvedilol (COREG) 25 MG tablet Take 25 mg by mouth 2 (two) times daily.     . chlorpheniramine-HYDROcodone (TUSSIONEX) 10-8 MG/5ML SUER Take 5 mLs by mouth every 12 (twelve) hours as needed for cough. 140 mL 0  . clopidogrel (PLAVIX) 75 MG tablet Take 1 tablet (75 mg total) by mouth daily. 30 tablet 11  . ENTRESTO 49-51 MG Take 1 tablet by mouth 2 (two) times daily.    . fluticasone (FLONASE) 50 MCG/ACT nasal spray Place 1 spray into both nostrils daily as needed for allergies.     . furosemide (LASIX) 20 MG tablet Take 20 mg by mouth daily as needed for fluid.     Marland Kitchen gabapentin (NEURONTIN) 300 MG capsule Take 300 mg by mouth 2 (two) times a day. Patient states he cant take this med bid because it causes dizziness and he is unable to drive on it.    Marland Kitchen ipratropium-albuterol (DUONEB) 0.5-2.5 (3) MG/3ML SOLN Take 3 mLs by nebulization every 4 (four) hours as needed (for SOB). 360 mL 0  . meloxicam (MOBIC) 15 MG tablet Take 15 mg by mouth daily as needed (for inflammation.).     Marland Kitchen oxyCODONE-acetaminophen (PERCOCET) 10-325 MG tablet Take 1 tablet by mouth 5 (five) times daily.     . predniSONE (STERAPRED UNI-PAK 21 TAB) 10 MG (21) TBPK tablet Take 6 tabs first day, 5 tab on day 2, then 4 on day 3rd, 3 tabs on day 4th , 2 tab on day 5th, and 1 tab on 6th day. 21 tablet 0  . simvastatin (ZOCOR) 40 MG tablet Take 40 mg by mouth daily.     Marland Kitchen tiotropium (SPIRIVA) 18 MCG inhalation capsule Place 1 capsule (18 mcg total) into inhaler and inhale every morning. (Patient not taking: Reported on 11/13/2018) 30 capsule 0   No current facility-administered medications on file prior to visit.     There are no  Patient Instructions  on file for this visit. No follow-ups on file.   Kris Hartmann, NP  This note was completed with Sales executive.  Any errors are purely unintentional.

## 2018-11-13 NOTE — Telephone Encounter (Signed)
Patient was seen today and scheduled for a RLE angio with Dr. Delana Meyer. The patient was given his pre-procedure instructions as well information regarding the zero visitor policy.

## 2018-11-15 ENCOUNTER — Encounter (INDEPENDENT_AMBULATORY_CARE_PROVIDER_SITE_OTHER): Payer: Self-pay

## 2018-11-17 ENCOUNTER — Ambulatory Visit
Admission: RE | Admit: 2018-11-17 | Discharge: 2018-11-17 | Disposition: A | Discharge status: Home / Self Care | Payer: Medicare Other | Source: Ambulatory Visit | Attending: Vascular Surgery | Admitting: Vascular Surgery

## 2018-11-17 ENCOUNTER — Other Ambulatory Visit: Payer: Self-pay

## 2018-11-17 DIAGNOSIS — Z1159 Encounter for screening for other viral diseases: Secondary | ICD-10-CM | POA: Insufficient documentation

## 2018-11-18 LAB — NOVEL CORONAVIRUS, NAA (HOSP ORDER, SEND-OUT TO REF LAB; TAT 18-24 HRS): SARS-CoV-2, NAA: NOT DETECTED

## 2018-11-20 MED ORDER — CEFAZOLIN SODIUM-DEXTROSE 2-4 GM/100ML-% IV SOLN: 2.0000 g | Freq: Once | INTRAVENOUS | Status: DC

## 2018-11-21 ENCOUNTER — Ambulatory Visit
Admission: RE | Admit: 2018-11-21 | Discharge: 2018-11-21 | Disposition: A | Discharge status: Home / Self Care | Payer: Medicare Other | Attending: Vascular Surgery | Admitting: Vascular Surgery

## 2018-11-21 ENCOUNTER — Encounter
Admission: RE | Disposition: A | Discharge status: Home / Self Care | Payer: Self-pay | Source: Home / Self Care | Attending: Vascular Surgery

## 2018-11-21 ENCOUNTER — Other Ambulatory Visit: Payer: Self-pay

## 2018-11-21 DIAGNOSIS — I110 Hypertensive heart disease with heart failure: Secondary | ICD-10-CM | POA: Insufficient documentation

## 2018-11-21 DIAGNOSIS — Z7902 Long term (current) use of antithrombotics/antiplatelets: Secondary | ICD-10-CM | POA: Diagnosis not present

## 2018-11-21 DIAGNOSIS — I70229 Atherosclerosis of native arteries of extremities with rest pain, unspecified extremity: Secondary | ICD-10-CM

## 2018-11-21 DIAGNOSIS — Z79899 Other long term (current) drug therapy: Secondary | ICD-10-CM | POA: Diagnosis not present

## 2018-11-21 DIAGNOSIS — Z8619 Personal history of other infectious and parasitic diseases: Secondary | ICD-10-CM | POA: Insufficient documentation

## 2018-11-21 DIAGNOSIS — J441 Chronic obstructive pulmonary disease with (acute) exacerbation: Secondary | ICD-10-CM | POA: Diagnosis not present

## 2018-11-21 DIAGNOSIS — I509 Heart failure, unspecified: Secondary | ICD-10-CM | POA: Insufficient documentation

## 2018-11-21 DIAGNOSIS — I70221 Atherosclerosis of native arteries of extremities with rest pain, right leg: Secondary | ICD-10-CM | POA: Diagnosis present

## 2018-11-21 DIAGNOSIS — M1990 Unspecified osteoarthritis, unspecified site: Secondary | ICD-10-CM | POA: Diagnosis not present

## 2018-11-21 DIAGNOSIS — Z825 Family history of asthma and other chronic lower respiratory diseases: Secondary | ICD-10-CM | POA: Insufficient documentation

## 2018-11-21 DIAGNOSIS — I428 Other cardiomyopathies: Secondary | ICD-10-CM | POA: Insufficient documentation

## 2018-11-21 DIAGNOSIS — Z87891 Personal history of nicotine dependence: Secondary | ICD-10-CM | POA: Insufficient documentation

## 2018-11-21 DIAGNOSIS — I2510 Atherosclerotic heart disease of native coronary artery without angina pectoris: Secondary | ICD-10-CM | POA: Diagnosis not present

## 2018-11-21 DIAGNOSIS — Z885 Allergy status to narcotic agent status: Secondary | ICD-10-CM | POA: Insufficient documentation

## 2018-11-21 HISTORY — PX: LOWER EXTREMITY ANGIOGRAPHY: CATH118251

## 2018-11-21 LAB — CREATININE, SERUM
Creatinine, Ser: 1.88 mg/dL — ABNORMAL HIGH (ref 0.61–1.24)
GFR calc Af Amer: 44 mL/min — ABNORMAL LOW (ref >60)
GFR calc non Af Amer: 38 mL/min — ABNORMAL LOW (ref >60)

## 2018-11-21 LAB — BUN: BUN: 49 mg/dL — ABNORMAL HIGH (ref 6–20)

## 2018-11-21 SURGERY — LOWER EXTREMITY ANGIOGRAPHY
Anesthesia: Moderate Sedation | Site: Leg Lower | Laterality: Right

## 2018-11-21 MED ORDER — HYDRALAZINE HCL 20 MG/ML IJ SOLN: 5.0000 mg | INTRAMUSCULAR | Status: DC | PRN

## 2018-11-21 MED ORDER — HEPARIN SODIUM (PORCINE) 1000 UNIT/ML IJ SOLN
INTRAMUSCULAR | Status: AC
  Filled 2018-11-21: qty 1

## 2018-11-21 MED ORDER — SODIUM CHLORIDE 0.9% FLUSH: 3.0000 mL | Freq: Two times a day (BID) | INTRAVENOUS | Status: DC

## 2018-11-21 MED ORDER — MIDAZOLAM HCL 2 MG/ML PO SYRP: 8.0000 mg | ORAL_SOLUTION | Freq: Once | ORAL | Status: DC | PRN

## 2018-11-21 MED ORDER — ONDANSETRON HCL 4 MG/2ML IJ SOLN: 4.0000 mg | Freq: Four times a day (QID) | INTRAMUSCULAR | Status: DC | PRN

## 2018-11-21 MED ORDER — IOHEXOL 300 MG/ML  SOLN
INTRAMUSCULAR | Status: DC | PRN
  Administered 2018-11-21: 10:00:00 110 mL via INTRAVENOUS

## 2018-11-21 MED ORDER — HEPARIN SODIUM (PORCINE) 1000 UNIT/ML IJ SOLN
INTRAMUSCULAR | Status: DC | PRN
  Administered 2018-11-21: 5000 [IU] via INTRAVENOUS

## 2018-11-21 MED ORDER — SODIUM CHLORIDE 0.9 % IV SOLN: 250.0000 mL | INTRAVENOUS | Status: DC | PRN

## 2018-11-21 MED ORDER — CEFAZOLIN SODIUM-DEXTROSE 2-4 GM/100ML-% IV SOLN
INTRAVENOUS | Status: AC
  Administered 2018-11-21: 09:00:00
  Filled 2018-11-21: qty 100

## 2018-11-21 MED ORDER — ASPIRIN 325 MG PO TABS
325.0000 mg | ORAL_TABLET | Freq: Once | ORAL | Status: AC
  Administered 2018-11-21: 325 mg via ORAL
  Filled 2018-11-21: qty 1

## 2018-11-21 MED ORDER — MIDAZOLAM HCL 5 MG/5ML IJ SOLN
INTRAMUSCULAR | Status: AC
  Filled 2018-11-21: qty 5

## 2018-11-21 MED ORDER — LABETALOL HCL 5 MG/ML IV SOLN: 10.0000 mg | INTRAVENOUS | Status: DC | PRN

## 2018-11-21 MED ORDER — SODIUM CHLORIDE 0.9 % IV SOLN
INTRAVENOUS | Status: DC
  Administered 2018-11-21: 1000 mL via INTRAVENOUS

## 2018-11-21 MED ORDER — ASPIRIN EC 325 MG PO TBEC
DELAYED_RELEASE_TABLET | ORAL | Status: AC
  Administered 2018-11-21: 325 mg
  Filled 2018-11-21: qty 1

## 2018-11-21 MED ORDER — FENTANYL CITRATE (PF) 100 MCG/2ML IJ SOLN: 12.5000 ug | Freq: Once | INTRAMUSCULAR | Status: DC | PRN

## 2018-11-21 MED ORDER — MORPHINE SULFATE (PF) 4 MG/ML IV SOLN: 2.0000 mg | INTRAVENOUS | Status: DC | PRN

## 2018-11-21 MED ORDER — SODIUM CHLORIDE 0.9 % IV SOLN: INTRAVENOUS | Status: DC

## 2018-11-21 MED ORDER — FENTANYL CITRATE (PF) 100 MCG/2ML IJ SOLN
INTRAMUSCULAR | Status: AC
  Filled 2018-11-21: qty 2

## 2018-11-21 MED ORDER — LIDOCAINE HCL (PF) 1 % IJ SOLN
INTRAMUSCULAR | Status: AC
  Filled 2018-11-21: qty 30

## 2018-11-21 MED ORDER — METHYLPREDNISOLONE SODIUM SUCC 125 MG IJ SOLR: 125.0000 mg | Freq: Once | INTRAMUSCULAR | Status: DC | PRN

## 2018-11-21 MED ORDER — MIDAZOLAM HCL 2 MG/2ML IJ SOLN
INTRAMUSCULAR | Status: DC | PRN
  Administered 2018-11-21 (×5): 1 mg via INTRAVENOUS
  Administered 2018-11-21: 2 mg via INTRAVENOUS

## 2018-11-21 MED ORDER — OXYCODONE HCL 5 MG PO TABS: 5.0000 mg | ORAL_TABLET | ORAL | Status: DC | PRN

## 2018-11-21 MED ORDER — DIPHENHYDRAMINE HCL 50 MG/ML IJ SOLN: 50.0000 mg | Freq: Once | INTRAMUSCULAR | Status: DC | PRN

## 2018-11-21 MED ORDER — FAMOTIDINE 20 MG PO TABS: 40.0000 mg | ORAL_TABLET | Freq: Once | ORAL | Status: DC | PRN

## 2018-11-21 MED ORDER — FENTANYL CITRATE (PF) 100 MCG/2ML IJ SOLN
INTRAMUSCULAR | Status: DC | PRN
  Administered 2018-11-21: 50 ug via INTRAVENOUS
  Administered 2018-11-21: 25 ug via INTRAVENOUS
  Administered 2018-11-21: 50 ug via INTRAVENOUS
  Administered 2018-11-21 (×2): 25 ug via INTRAVENOUS

## 2018-11-21 MED ORDER — SODIUM CHLORIDE 0.9% FLUSH: 3.0000 mL | INTRAVENOUS | Status: DC | PRN

## 2018-11-21 MED ORDER — ACETAMINOPHEN 325 MG PO TABS: 650.0000 mg | ORAL_TABLET | ORAL | Status: DC | PRN

## 2018-11-21 SURGICAL SUPPLY — 26 items
BALLN LUTONIX 4X220X130 (BALLOONS) ×3
BALLN LUTONIX 5X120X130 (BALLOONS) ×3
BALLN LUTONIX 5X220X130 (BALLOONS) ×3
BALLN LUTONIX DCB 4X100X130 (BALLOONS) ×3
BALLOON LUTONIX 4X220X130 (BALLOONS) IMPLANT
BALLOON LUTONIX 5X120X130 (BALLOONS) IMPLANT
BALLOON LUTONIX 5X220X130 (BALLOONS) IMPLANT
BALLOON LUTONIX DCB 4X100X130 (BALLOONS) IMPLANT
CATH CROSSER 14S OTW 146CM (CATHETERS) ×2 IMPLANT
CATH PIG 70CM (CATHETERS) ×2 IMPLANT
CATH SIDEKICK XL ST 110CM (SHEATH) ×2 IMPLANT
DEVICE PRESTO INFLATION (MISCELLANEOUS) ×2 IMPLANT
DEVICE STARCLOSE SE CLOSURE (Vascular Products) ×2 IMPLANT
GLIDEWIRE ADV .035X260CM (WIRE) ×2 IMPLANT
KIT FLOWMATE PROCEDURAL (KITS) ×2 IMPLANT
LIFESTENT SOLO 6X200X135 (Permanent Stent) ×2 IMPLANT
NDL ENTRY 21GA 7CM ECHOTIP (NEEDLE) IMPLANT
NEEDLE ENTRY 21GA 7CM ECHOTIP (NEEDLE) ×3 IMPLANT
PACK ANGIOGRAPHY (CUSTOM PROCEDURE TRAY) ×3 IMPLANT
SET INTRO CAPELLA COAXIAL (SET/KITS/TRAYS/PACK) ×2 IMPLANT
SHEATH BRITE TIP 5FRX11 (SHEATH) ×2 IMPLANT
SHEATH FLEXOR ANSEL2 7FRX45 (SHEATH) ×2 IMPLANT
STENT LIFESTENT 5F 6X100X135 (Permanent Stent) ×2 IMPLANT
SYR MEDRAD MARK 7 150ML (SYRINGE) ×2 IMPLANT
TUBING CONTRAST HIGH PRESS 72 (TUBING) ×2 IMPLANT
WIRE J 3MM .035X145CM (WIRE) ×2 IMPLANT

## 2018-11-21 NOTE — H&P (Signed)
[facility] History & Physical Update  The patient was interviewed and re-examined.  The patient's previous History and Physical has been reviewed and is unchanged.  There is no change in the plan of care. We plan to proceed with the scheduled procedure.  Hortencia Pilar, MD  11/21/2018, 8:26 AM

## 2018-11-21 NOTE — Op Note (Signed)
[name] Percutaneous Study/Intervention Procedural Note   Date of Surgery: 11/21/2018  Surgeon:  Katha Cabal, MD.  Pre-operative Diagnosis: Atherosclerotic occlusive disease bilateral lower extremities with lifestyle limiting claudication and mild rest pain symptoms of the right lower extremity  Post-operative diagnosis: Same  Procedure(s) Performed: 1. Introduction catheter into right lower extremity 3rd order catheter placement  2. Contrast injection right lower extremity for distal runoff   3. Crosser atherectomy of the right SFA and above-knee popliteal arteries 4.  Percutaneous transluminal angioplasty and stent placement right superficial femoral artery and popliteal             5.   Star close closure left common femoral arteriotomy  Anesthesia: Conscious sedation was administered by the radiology RN under my direct supervision. IV Versed plus fentanyl were utilized. Continuous ECG, pulse oximetry and blood pressure was monitored throughout the entire procedure. Conscious sedation was for a total of 68 minutes.  Sheath: 7 [place] Ansell left common femoral retrograde  Contrast: 110 cc  Fluoroscopy Time: 10.3 minutes  Indications: [name] presents with worsening pain of the right lower extremity.  Noninvasive studies as well as physical exam show worsening of his atherosclerotic occlusive disease.  He now has changes that are consistent with an SFA occlusion on the right.  The risks and benefits are reviewed all questions answered patient agrees to proceed.  Procedure: [name] is a 63 y.o. y.o. male who was identified and appropriate procedural time out was performed. The patient was then placed supine on the table and prepped and draped in the usual sterile fashion.   Ultrasound was placed in the sterile sleeve and the left groin was evaluated the left common femoral artery  was echolucent and pulsatile indicating patency.  Image was recorded for the permanent record and under real-time visualization a microneedle was inserted into the common femoral artery microwire followed by a micro-sheath.  A J-wire was then advanced through the micro-sheath and a  5 [place] sheath was then inserted over a J-wire. J-wire was then advanced and a 5 French pigtail catheter was positioned at the level of T12. AP projection of the aorta was then obtained. Pigtail catheter was repositioned to above the bifurcation and a LAO view of the pelvis was obtained.  Subsequently a pigtail catheter with the stiff angle Glidewire was used to cross the aortic bifurcation the catheter wire were advanced down into the right distal external iliac artery. Oblique view of the femoral bifurcation was then obtained and subsequently the wire was reintroduced and the pigtail catheter negotiated into the SFA representing third order catheter placement. Distal runoff was then performed.  Diagnostic interpretation: The abdominal aorta is opacified with a bolus injection of contrast.  There are no hemodynamically significant lesions.  Bilateral renal arteries are noted with normal nephrograms.  They are widely patent.  There are single.  Aortic bifurcation common iliac and external iliac arteries are widely patent.  The bilateral internal iliac arteries demonstrate diffuse atherosclerotic changes.  The right common femoral and profunda femoris are widely patent.  The SFA demonstrates moderate disease near its origin which progresses to an occlusion in its midportion extending down to the above-knee popliteal.  The mid popliteal is reconstituted and free of any hemodynamically significant lesions.  Distal popliteal and trifurcation is patent and there is three-vessel runoff to the foot with the anterior tibial and posterior tibial contributing equally to the pedal arch.  5000 units of heparin was  then given and allowed to  circulate and a 7 [place] Ansell sheath was advanced up and over the bifurcation and positioned in the femoral artery  The 14 S Crosser catheter was then prepped on the field and a straight side kick catheter was advanced into the cul-de-sac of the SFA under magnified imaging in the LAO projection. Using the Crosser catheter the occlusion of the SFA and popliteal was negotiated.  Injection of contrast through the Crosser side port confirmed intraluminal positioning.  Side kick catheter and stiff angle Glidewire were then advanced down into the distal popliteal.  Distal runoff was then completed by hand injection through the catheter.    A 4 mm x 22 cm Lutonix balloon followed by a 4 mm x 10 cm Lutonix drug-eluting balloon was used to angioplasty the superficial femoral and popliteal arteries. Inflations were to 10 atmospheres for 2 minutes. Follow-up imaging demonstrated patency with greater than 50% residual stenosis throughout the treated segment.  Therefore a 6 mm x 200 mm life stent followed by a 6 mm x 100 mm life stent was deployed and subsequently postdilated with a 5 mm Lutonix drug-eluting balloons.  Distal runoff was then reassessed.  After review of these images the sheath is pulled into the left external iliac oblique of the common femoral is obtained and a Star close device deployed. There no immediate Complications.  Findings:  The abdominal aorta is opacified with a bolus injection of contrast.  There are no hemodynamically significant lesions.  Bilateral renal arteries are noted with normal nephrograms.  They are widely patent.  There are single.  Aortic bifurcation common iliac and external iliac arteries are widely patent.  The bilateral internal iliac arteries demonstrate diffuse atherosclerotic changes.  The right common femoral and profunda femoris are widely patent.  The SFA demonstrates moderate disease near its origin which progresses to an occlusion in its midportion extending  down to the above-knee popliteal.  The mid popliteal is reconstituted and free of any hemodynamically significant lesions.  Distal popliteal and trifurcation is patent and there is three-vessel runoff to the foot with the anterior tibial and posterior tibial contributing equally to the pedal arch.  Angioplasty of the SFA and above-knee popliteal yields patency but with greater than 50% residual stenosis and therefore life stents are deployed and postdilated with a 5 mm drug-eluting balloon.  This results in an excellent  result with less than 10% residual stenosis throughout the SFA and popliteal.  There is preservation of the three-vessel runoff.    Disposition: Patient was taken to the recovery room in stable condition having tolerated the procedure well.  [name] 11/21/2018,9:42 AM

## 2018-11-27 ENCOUNTER — Telehealth (INDEPENDENT_AMBULATORY_CARE_PROVIDER_SITE_OTHER): Payer: Self-pay

## 2018-11-27 NOTE — Telephone Encounter (Signed)
He  should wear compression hose for swelling as well as elevation.  Walking as tolerated will also help.   Some swelling and cramping is normal following an angiogram. It can persist as long as a month but it is generally gone in two weeks or so. This doesn't appear to be anything that we need to bring him in urgently for.  If he begins to have unbearable continuous pain, color changes, or a decrease in motor function, we would need to see him earlier than his scheduled visit.

## 2018-11-27 NOTE — Telephone Encounter (Signed)
Patient's wife called stating the patient's left leg is swollen some and he has had some cramps yesterday. No other symptoms. Patient had a right leg angio with Dr. Delana Meyer on 11/21/2018. I asked if the patient is elevating or wearing compression hose. Patient's wife stated he has elevated some but he wasn't told to wear compression hose.

## 2018-11-27 NOTE — Telephone Encounter (Signed)
Patient's wife was given the recommendations from Eulogio Ditch NP. See notes below.

## 2018-12-05 ENCOUNTER — Other Ambulatory Visit (INDEPENDENT_AMBULATORY_CARE_PROVIDER_SITE_OTHER): Payer: Self-pay | Admitting: Vascular Surgery

## 2018-12-05 DIAGNOSIS — Z95820 Peripheral vascular angioplasty status with implants and grafts: Secondary | ICD-10-CM

## 2018-12-07 ENCOUNTER — Encounter (INDEPENDENT_AMBULATORY_CARE_PROVIDER_SITE_OTHER): Payer: Self-pay | Admitting: Nurse Practitioner

## 2018-12-07 ENCOUNTER — Ambulatory Visit (INDEPENDENT_AMBULATORY_CARE_PROVIDER_SITE_OTHER): Payer: Medicare Other | Admitting: Nurse Practitioner

## 2018-12-07 ENCOUNTER — Other Ambulatory Visit: Payer: Self-pay

## 2018-12-07 ENCOUNTER — Ambulatory Visit (INDEPENDENT_AMBULATORY_CARE_PROVIDER_SITE_OTHER): Payer: Medicare Other

## 2018-12-07 VITALS — BP 103/60 | HR 59 | Resp 16 | Wt 143.0 lb

## 2018-12-07 DIAGNOSIS — I70211 Atherosclerosis of native arteries of extremities with intermittent claudication, right leg: Secondary | ICD-10-CM | POA: Diagnosis not present

## 2018-12-07 DIAGNOSIS — J441 Chronic obstructive pulmonary disease with (acute) exacerbation: Secondary | ICD-10-CM | POA: Diagnosis not present

## 2018-12-07 DIAGNOSIS — E782 Mixed hyperlipidemia: Secondary | ICD-10-CM | POA: Diagnosis not present

## 2018-12-07 DIAGNOSIS — Z95820 Peripheral vascular angioplasty status with implants and grafts: Secondary | ICD-10-CM | POA: Diagnosis not present

## 2018-12-07 DIAGNOSIS — Z79899 Other long term (current) drug therapy: Secondary | ICD-10-CM

## 2018-12-07 DIAGNOSIS — Z7902 Long term (current) use of antithrombotics/antiplatelets: Secondary | ICD-10-CM

## 2018-12-07 DIAGNOSIS — I10 Essential (primary) hypertension: Secondary | ICD-10-CM | POA: Diagnosis not present

## 2018-12-07 DIAGNOSIS — Z87891 Personal history of nicotine dependence: Secondary | ICD-10-CM

## 2018-12-07 NOTE — Progress Notes (Signed)
SUBJECTIVE:  Patient ID: [name], male    DOB: [birth year], 63 y.o.   MRN: [id number] Chief Complaint  Patient presents with  . Follow-up    [facility] 2week ABI    HPI  [name] is a 63 y.o. male The patient returns to the office for followup and review of the noninvasive studies. There have been no interval changes in lower extremity symptoms. No interval shortening of the patient's claudication distance or development of rest pain symptoms. No new ulcers or wounds have occurred since the last visit.  There have been no significant changes to the patient's overall health care.  The patient denies amaurosis fugax or recent TIA symptoms. There are no recent neurological changes noted. The patient denies history of DVT, PE or superficial thrombophlebitis. The patient denies recent episodes of angina or shortness of breath.   ABI Rt=1.00 and Lt=1.05  (previous ABI's Rt=0.53 and Lt=1.0) Duplex ultrasound of the left tibial artery revealed triphasic waveforms whereas the right tibial arteries have monophasic waveforms within the anterior tibial artery and biphasic within the posterior tibial artery.  Past Medical History:  Diagnosis Date  . Arthritis    knees, lower back  . Cardiomyopathy ([place])   . CHF (congestive heart failure) ([place])   . Chronic back pain   . Coronary artery disease   . Hepatitis C virus infection without hepatic coma    received treatment. clear now  . Hypertension   . Kidney stones     Past Surgical History:  Procedure Laterality Date  . BACK SURGERY    . CARDIAC CATHETERIZATION    . CHOLECYSTECTOMY    . COLONOSCOPY WITH PROPOFOL N/A 03/29/2017   Procedure: COLONOSCOPY WITH PROPOFOL;  Surgeon: Lucilla Lame, MD;  Location: [facility] ENDOSCOPY;  Service: Endoscopy;  Laterality: N/A;  . ESOPHAGOGASTRODUODENOSCOPY (EGD) WITH PROPOFOL N/A 03/29/2017   Procedure: ESOPHAGOGASTRODUODENOSCOPY (EGD) WITH PROPOFOL;  Surgeon: Lucilla Lame, MD;  Location: [facility]  ENDOSCOPY;  Service: Endoscopy;  Laterality: N/A;  . HERNIA REPAIR    . LOWER EXTREMITY ANGIOGRAPHY Right 11/09/2017   Procedure: LOWER EXTREMITY ANGIOGRAPHY;  Surgeon: Katha Cabal, MD;  Location: [place] [facility];  Service: Cardiovascular;  Laterality: Right;  . LOWER EXTREMITY ANGIOGRAPHY Right 11/21/2018   Procedure: LOWER EXTREMITY ANGIOGRAPHY;  Surgeon: Katha Cabal, MD;  Location: [place] [facility];  Service: Cardiovascular;  Laterality: Right;  . SPINAL FUSION      Social History   Socioeconomic History  . Marital status: Married    Spouse name: Not on file  . Number of children: Not on file  . Years of education: Not on file  . Highest education level: Not on file  Occupational History  . Not on file  Social Needs  . Financial resource strain: Not on file  . Food insecurity:    Worry: Not on file    Inability: Not on file  . Transportation needs:    Medical: Not on file    Non-medical: Not on file  Tobacco Use  . Smoking status: Former Smoker    Last attempt to quit: 2013    Years since quitting: 7.4  . Smokeless tobacco: Never Used  Substance and Sexual Activity  . Alcohol use: No  . Drug use: No  . Sexual activity: Not on file  Lifestyle  . Physical activity:    Days per week: Not on file    Minutes per session: Not on file  . Stress: Not on file  Relationships  . Social connections:    Talks on phone: Not on file    Gets together: Not on file    Attends religious service: Not on file    Active member of club or organization: Not on file    Attends meetings of clubs or organizations: Not on file    Relationship status: Not on file  . Intimate partner violence:    Fear of current or ex partner: Not on file    Emotionally abused: Not on file    Physically abused: Not on file    Forced sexual activity: Not on file  Other Topics Concern  . Not on file  Social History Narrative  . Not on file    Family History  Problem Relation Age of  Onset  . COPD Mother     Allergies  Allergen Reactions  . Codeine Itching     Review of Systems   Review of Systems: Negative Unless Checked Constitutional: [] Weight loss  [] Fever  [] Chills Cardiac: [] Chest pain   []  Atrial Fibrillation  [] Palpitations   [] Shortness of breath when laying flat   [] Shortness of breath with exertion. [] Shortness of breath at rest Vascular:  [] Pain in legs with walking   [] Pain in legs with standing [] Pain in legs when laying flat   [x] Claudication    [] Pain in feet when laying flat    [] History of DVT   [] Phlebitis   [] Swelling in legs   [] Varicose veins   [] Non-healing ulcers Pulmonary:   [] Uses home oxygen   [] Productive cough   [] Hemoptysis   [] Wheeze  [x] COPD   [] Asthma Neurologic:  [] Dizziness   [] Seizures  [] Blackouts [] History of stroke   [] History of TIA  [] Aphasia   [] Temporary Blindness   [] Weakness or numbness in arm   [] Weakness or numbness in leg Musculoskeletal:   [] Joint swelling   [] Joint pain   [] Low back pain  []  History of Knee Replacement [x] Arthritis [] back Surgeries  []  Spinal Stenosis    Hematologic:  [] Easy bruising  [] Easy bleeding   [] Hypercoagulable state   [] Anemic Gastrointestinal:  [] Diarrhea   [] Vomiting  [] Gastroesophageal reflux/heartburn   [] Difficulty swallowing. [] Abdominal pain Genitourinary:  [] Chronic kidney disease   [] Difficult urination  [] Anuric   [] Blood in urine [] Frequent urination  [] Burning with urination   [] Hematuria Skin:  [] Rashes   [] Ulcers [] Wounds Psychological:  [] History of anxiety   []  History of major depression  []  Memory Difficulties      OBJECTIVE:   Physical Exam  BP 103/60 (BP Location: Right Arm)   Pulse (!) 59   Resp 16   Wt 143 lb (64.9 kg)   BMI 22.40 kg/m   Gen: WD/WN, NAD Head: [place]/AT, No temporalis wasting.  Ear/Nose/Throat: Hearing grossly intact, nares w/o erythema or drainage Eyes: PER, EOMI, sclera nonicteric.  Neck: Supple, no masses.  No JVD.  Pulmonary:  Good air  movement, no use of accessory muscles.  Cardiac: RRR Vascular:  Vessel Right Left  Radial Palpable Palpable  Brachial Palpable Palpable  Femoral Palpable Palpable  Popliteal Palpable Palpable  Dorsalis Pedis Palpable Palpable  Posterior Tibial Palpable Palpable   Gastrointestinal: soft, non-distended. No guarding/no peritoneal signs.  Musculoskeletal: M/S 5/5 throughout.  No deformity or atrophy.  Neurologic: Pain and light touch intact in extremities.  Symmetrical.  Speech is fluent. Motor exam as listed above. Psychiatric: Judgment intact, Mood & affect appropriate for pt's clinical situation. Dermatologic: No Venous rashes. No Ulcers Noted.  No changes consistent with  cellulitis. Lymph : No Cervical lymphadenopathy, no lichenification or skin changes of chronic lymphedema.       ASSESSMENT AND PLAN:  1. Atherosclerosis of native artery of right lower extremity with intermittent claudication (HCC) Recommend:  The patient is status post successful angiogram with intervention.  The patient reports that the claudication symptoms and leg pain is essentially gone.   The patient denies lifestyle limiting changes at this point in time.  No further invasive studies, angiography or surgery at this time The patient should continue walking and begin a more formal exercise program.  The patient should continue antiplatelet therapy and aggressive treatment of the lipid abnormalities  Smoking cessation was again discussed  The patient should continue wearing graduated compression socks 10-15 mmHg strength to control the mild edema.  Patient should undergo noninvasive studies in 3 months The patient will follow up with me after the studies.   - VAS [place] ABI WITH/WO TBI; Future  2. Essential hypertension Continue antihypertensive medications as already ordered, these medications have been reviewed and there are no changes at this time.   3. Mixed hyperlipidemia Continue statin as ordered  and reviewed, no changes at this time   4. COPD exacerbation ([place]) Continue pulmonary medications and aerosols as already ordered, these medications have been reviewed and there are no changes at this time.     Current Outpatient Medications on File Prior to Visit  Medication Sig Dispense Refill  . ALPRAZolam (XANAX) 1 MG tablet Take 1 mg by mouth 3 (three) times daily.  5  . carvedilol (COREG) 25 MG tablet Take 25 mg by mouth 2 (two) times daily.     . clopidogrel (PLAVIX) 75 MG tablet Take 1 tablet (75 mg total) by mouth daily. 30 tablet 11  . cyclobenzaprine (FLEXERIL) 10 MG tablet Take 10 mg by mouth 2 (two) times daily as needed for muscle spasms.     Marland Kitchen ENTRESTO 49-51 MG Take 1 tablet by mouth 2 (two) times daily.    . fluticasone (FLONASE) 50 MCG/ACT nasal spray Place 1 spray into both nostrils daily as needed for allergies.     . furosemide (LASIX) 20 MG tablet Take 20 mg by mouth daily as needed for fluid.     Marland Kitchen gabapentin (NEURONTIN) 300 MG capsule Take 300 mg by mouth at bedtime.     Marland Kitchen ipratropium-albuterol (DUONEB) 0.5-2.5 (3) MG/3ML SOLN Take 3 mLs by nebulization every 4 (four) hours as needed (for SOB). 360 mL 0  . magnesium hydroxide (MILK OF MAGNESIA) 400 MG/5ML suspension Take 15 mLs by mouth daily as needed for mild constipation or indigestion.    . meloxicam (MOBIC) 15 MG tablet Take 15 mg by mouth daily as needed (for inflammation.).     Marland Kitchen oxyCODONE-acetaminophen (PERCOCET) 10-325 MG tablet Take 1 tablet by mouth 5 (five) times daily.     . rosuvastatin (CRESTOR) 40 MG tablet Take 40 mg by mouth daily.    Marland Kitchen tiotropium (SPIRIVA) 18 MCG inhalation capsule Place 1 capsule (18 mcg total) into inhaler and inhale every morning. (Patient not taking: Reported on 11/13/2018) 30 capsule 0   No current facility-administered medications on file prior to visit.     There are no Patient Instructions on file for this visit. Return in about 3 months (around 03/09/2019) for PAD.    Kris Hartmann, NP  This note was completed with Sales executive.  Any errors are purely unintentional.

## 2018-12-12 DIAGNOSIS — R768 Other specified abnormal immunological findings in serum: Secondary | ICD-10-CM | POA: Insufficient documentation

## 2019-03-09 ENCOUNTER — Other Ambulatory Visit (INDEPENDENT_AMBULATORY_CARE_PROVIDER_SITE_OTHER): Payer: Self-pay | Admitting: Vascular Surgery

## 2019-03-09 DIAGNOSIS — I739 Peripheral vascular disease, unspecified: Secondary | ICD-10-CM

## 2019-03-12 ENCOUNTER — Other Ambulatory Visit: Payer: Self-pay

## 2019-03-12 ENCOUNTER — Encounter (INDEPENDENT_AMBULATORY_CARE_PROVIDER_SITE_OTHER): Payer: Self-pay | Admitting: Vascular Surgery

## 2019-03-12 ENCOUNTER — Ambulatory Visit (INDEPENDENT_AMBULATORY_CARE_PROVIDER_SITE_OTHER): Payer: Medicare Other | Admitting: Vascular Surgery

## 2019-03-12 ENCOUNTER — Ambulatory Visit (INDEPENDENT_AMBULATORY_CARE_PROVIDER_SITE_OTHER): Payer: Medicare Other

## 2019-03-12 VITALS — BP 95/59 | HR 72 | Resp 16 | Ht 67.0 in | Wt 140.0 lb

## 2019-03-12 DIAGNOSIS — E782 Mixed hyperlipidemia: Secondary | ICD-10-CM | POA: Diagnosis not present

## 2019-03-12 DIAGNOSIS — I10 Essential (primary) hypertension: Secondary | ICD-10-CM | POA: Diagnosis not present

## 2019-03-12 DIAGNOSIS — M1991 Primary osteoarthritis, unspecified site: Secondary | ICD-10-CM

## 2019-03-12 DIAGNOSIS — J42 Unspecified chronic bronchitis: Secondary | ICD-10-CM

## 2019-03-12 DIAGNOSIS — I70211 Atherosclerosis of native arteries of extremities with intermittent claudication, right leg: Secondary | ICD-10-CM

## 2019-03-12 DIAGNOSIS — I739 Peripheral vascular disease, unspecified: Secondary | ICD-10-CM

## 2019-03-12 NOTE — Progress Notes (Signed)
MRN : [id number]  [name] is a 63 y.o. (April 11, 1956) male who presents with chief complaint of No chief complaint on file. Marland Kitchen  History of Present Illness:   The patient returns to the office for followup and review status post angiogram with intervention 11/21/2018.  Procedure(s) Performed: 1. Introduction catheter into right lower extremity 3rd order catheter placement  2. Contrast injection right lower extremity for distal runoff   3. Crosser atherectomy of the right SFA and above-knee popliteal arteries 4.  Percutaneous transluminal angioplasty and stent placement right superficial femoral artery and popliteal             5.   Star close closure left common femoral arteriotomy   The patient notes improvement in the lower extremity symptoms. No interval shortening of the patient's claudication distance or rest pain symptoms. Previous wounds have now healed.  No new ulcers or wounds have occurred since the last visit.  There have been no significant changes to the patient's overall health care.  The patient denies amaurosis fugax or recent TIA symptoms. There are no recent neurological changes noted. The patient denies history of DVT, PE or superficial thrombophlebitis. The patient denies recent episodes of angina or shortness of breath.   ABI's Rt=0.89 and Lt=0.94  (previous ABI's Rt=1.00 and Lt=1.05)   No outpatient medications have been marked as taking for the 03/12/19 encounter (Appointment) with Delana Meyer, Dolores Lory, MD.    Past Medical History:  Diagnosis Date  . Arthritis    knees, lower back  . Cardiomyopathy ([place])   . CHF (congestive heart failure) ([place])   . Chronic back pain   . Coronary artery disease   . Hepatitis C virus infection without hepatic coma    received treatment. clear now  . Hypertension   . Kidney stones     Past Surgical History:  Procedure Laterality Date  . BACK SURGERY    . CARDIAC  CATHETERIZATION    . CHOLECYSTECTOMY    . COLONOSCOPY WITH PROPOFOL N/A 03/29/2017   Procedure: COLONOSCOPY WITH PROPOFOL;  Surgeon: Lucilla Lame, MD;  Location: [facility] ENDOSCOPY;  Service: Endoscopy;  Laterality: N/A;  . ESOPHAGOGASTRODUODENOSCOPY (EGD) WITH PROPOFOL N/A 03/29/2017   Procedure: ESOPHAGOGASTRODUODENOSCOPY (EGD) WITH PROPOFOL;  Surgeon: Lucilla Lame, MD;  Location: [facility] ENDOSCOPY;  Service: Endoscopy;  Laterality: N/A;  . HERNIA REPAIR    . LOWER EXTREMITY ANGIOGRAPHY Right 11/09/2017   Procedure: LOWER EXTREMITY ANGIOGRAPHY;  Surgeon: Katha Cabal, MD;  Location: [place] [facility];  Service: Cardiovascular;  Laterality: Right;  . LOWER EXTREMITY ANGIOGRAPHY Right 11/21/2018   Procedure: LOWER EXTREMITY ANGIOGRAPHY;  Surgeon: Katha Cabal, MD;  Location: [place] [facility];  Service: Cardiovascular;  Laterality: Right;  . SPINAL FUSION      Social History Social History   Tobacco Use  . Smoking status: Former Smoker    Quit date: 2013    Years since quitting: 7.6  . Smokeless tobacco: Never Used  Substance Use Topics  . Alcohol use: No  . Drug use: No    Family History Family History  Problem Relation Age of Onset  . COPD Mother     Allergies  Allergen Reactions  . Codeine Itching     REVIEW OF SYSTEMS (Negative unless checked)  Constitutional: [] Weight loss  [] Fever  [] Chills Cardiac: [] Chest pain   [] Chest pressure   [] Palpitations   [] Shortness of breath when laying flat   [] Shortness of breath with exertion. Vascular:  [x] Pain in  legs with walking   [] Pain in legs at rest  [] History of DVT   [] Phlebitis   [x] Swelling in legs   [] Varicose veins   [] Non-healing ulcers Pulmonary:   [] Uses home oxygen   [] Productive cough   [] Hemoptysis   [] Wheeze  [] COPD   [] Asthma Neurologic:  [] Dizziness   [] Seizures   [] History of stroke   [] History of TIA  [] Aphasia   [] Vissual changes   [] Weakness or numbness in arm   [] Weakness or numbness in leg  Musculoskeletal:   [] Joint swelling   [x] Joint pain   [] Low back pain Hematologic:  [] Easy bruising  [] Easy bleeding   [] Hypercoagulable state   [] Anemic Gastrointestinal:  [] Diarrhea   [] Vomiting  [] Gastroesophageal reflux/heartburn   [] Difficulty swallowing. Genitourinary:  [] Chronic kidney disease   [] Difficult urination  [] Frequent urination   [] Blood in urine Skin:  [] Rashes   [] Ulcers  Psychological:  [] History of anxiety   []  History of major depression.  Physical Examination  There were no vitals filed for this visit. There is no height or weight on file to calculate BMI. Gen: WD/WN, NAD Head: [place]/AT, No temporalis wasting.  Ear/Nose/Throat: Hearing grossly intact, nares w/o erythema or drainage Eyes: PER, EOMI, sclera nonicteric.  Neck: Supple, no large masses.   Pulmonary:  Good air movement, no audible wheezing bilaterally, no use of accessory muscles.  Cardiac: RRR, no JVD Vascular: scattered varicosities present bilaterally.  Mild venous stasis changes to the legs bilaterally.  2+ soft pitting edema Vessel Right Left  PT Palpable Palpable  DP Palpable Trace Palpable  Gastrointestinal: Non-distended. No guarding/no peritoneal signs.  Musculoskeletal: M/S 5/5 throughout.  No deformity or atrophy.  Neurologic: CN 2-12 intact. Symmetrical.  Speech is fluent. Motor exam as listed above. Psychiatric: Judgment intact, Mood & affect appropriate for pt's clinical situation. Dermatologic: No rashes or ulcers noted.  No changes consistent with cellulitis. Lymph : No lichenification or skin changes of chronic lymphedema.  CBC Lab Results  Component Value Date   WBC 17.9 (H) 12/05/2017   HGB 13.7 12/05/2017   HCT 42.4 12/05/2017   MCV 80.5 12/05/2017   PLT 262 12/05/2017    BMET    Component Value Date/Time   NA 137 12/05/2017 0431   NA 139 03/27/2014 0935   K 3.8 12/05/2017 0431   K 4.7 03/27/2014 0935   CL 102 12/05/2017 0431   CL 103 03/27/2014 0935   CO2 27  12/05/2017 0431   CO2 28 03/27/2014 0935   GLUCOSE 195 (H) 12/05/2017 0431   GLUCOSE 109 (H) 03/27/2014 0935   BUN 49 (H) 11/21/2018 0736   BUN 17 03/27/2014 0935   CREATININE 1.88 (H) 11/21/2018 0736   CREATININE 1.52 (H) 03/27/2014 0935   CALCIUM 8.4 (L) 12/05/2017 0431   CALCIUM 8.9 03/27/2014 0935   GFRNONAA 38 (L) 11/21/2018 0736   GFRNONAA 51 (L) 03/27/2014 0935   GFRAA 44 (L) 11/21/2018 0736   GFRAA 59 (L) 03/27/2014 0935   CrCl cannot be calculated (Patient's most recent lab result is older than the maximum 21 days allowed.).  COAG Lab Results  Component Value Date   INR 0.9 03/01/2016   INR 0.8 08/07/2007   INR 0.9 05/04/2007    Radiology No results found.  Assessment/Plan 1. Atherosclerosis of native artery of right lower extremity with intermittent claudication (HCC) Recommend:  The patient is status post successful angiogram with intervention.  The patient reports that the claudication symptoms and leg pain is essentially gone.  The patient denies lifestyle limiting changes at this point in time.  No further invasive studies, angiography or surgery at this time The patient should continue walking and begin a more formal exercise program.  The patient should continue antiplatelet therapy and aggressive treatment of the lipid abnormalities  Smoking cessation was again discussed  The patient should continue wearing graduated compression socks 10-15 mmHg strength to control the mild edema.  Patient should undergo noninvasive studies as ordered. The patient will follow up with me after the studies.   - VAS [place] ABI WITH/WO TBI; Future - VAS [place] LOWER EXTREMITY ARTERIAL DUPLEX; Future  2. Essential hypertension Continue antihypertensive medications as already ordered, these medications have been reviewed and there are no changes at this time.   3. Chronic bronchitis, unspecified chronic bronchitis type ([place]) Continue pulmonary medications and aerosols as  already ordered, these medications have been reviewed and there are no changes at this time.    4. Mixed hyperlipidemia Continue statin as ordered and reviewed, no changes at this time   5. Primary osteoarthritis, unspecified site Continue NSAID medications as already ordered, these medications have been reviewed and there are no changes at this time.  Continued activity and therapy was stressed.    Hortencia Pilar, MD  03/12/2019 11:18 AM

## 2019-05-02 ENCOUNTER — Other Ambulatory Visit: Payer: Self-pay

## 2019-05-02 ENCOUNTER — Ambulatory Visit (INDEPENDENT_AMBULATORY_CARE_PROVIDER_SITE_OTHER): Payer: Medicare Other | Admitting: Urology

## 2019-05-02 ENCOUNTER — Encounter: Payer: Self-pay | Admitting: Urology

## 2019-05-02 VITALS — BP 85/49 | HR 63 | Ht 67.0 in | Wt 138.0 lb

## 2019-05-02 DIAGNOSIS — R109 Unspecified abdominal pain: Secondary | ICD-10-CM | POA: Diagnosis not present

## 2019-05-02 DIAGNOSIS — N200 Calculus of kidney: Secondary | ICD-10-CM | POA: Diagnosis not present

## 2019-05-02 DIAGNOSIS — E7209 Other disorders of amino-acid transport: Secondary | ICD-10-CM

## 2019-05-02 DIAGNOSIS — N289 Disorder of kidney and ureter, unspecified: Secondary | ICD-10-CM

## 2019-05-02 NOTE — Progress Notes (Signed)
05/02/2019 11:56 AM   [name] [birth year] AS:[id number]  Referring provider: Dionisio David, MD [place],  [place] [address]  Chief Complaint  Patient presents with  . Obstructive uropathy    HPI: 63 year old male referred for further evaluation of kidney stones.  He was added on his urgent referral by his primary care, Dr. Chancy Milroy.  Had been having right flank pain for the past several weeks.  He was seen at walk-in clinic at which time a KUB indicated bilateral kidney stones and a little bit of blood in his urine on dip.  He was treated for presumed infection although urine culture was negative.  He followed up with his primary care on Monday still complaining of the right flank pain.  They ordered a noncontrast CT scan which was performed on Monday for which we only have the report available.  The read says "increase in size and number of calculi in the lower pole of the right kidney which in total measures 2.4 x 1.2 x 1.7 cm.  No ureteral calculi are noted no evidence of obstructive uropathy.  He goes on to indicate that he is an enlarging simple renal cyst but does not indicate the laterality.  It also indicates increasing calculi in the mid and lower pole of the left kidney and probable mild obstructive change at the UPJ.  There is upper pole caliectasis.  It is unclear from the report whether the patient has a UPJ obstruction, stone, etc. disc is not available to me today.  The patient indicates that he continues to have some dull mild right flank pain which comes and goes.  This is consistent with his previous kidney stone pain.  Is not severe.  No exacerbating or alleviating factors.  He denies any urinary symptoms, gross hematuria, dysuria, fevers, chills, nausea or vomiting.  He does report that he has a long history of stones.  He reports that he underwent multiple procedures in the 90s for kidney stones in [place] as well as at [facility].  He had shockwave  lithotripsy which was not effective.  He also sounds like he had a PCNL.  He does mention today that his stones are cystine.    PMH: Past Medical History:  Diagnosis Date  . Arthritis    knees, lower back  . Cardiomyopathy ([place])   . CHF (congestive heart failure) ([place])   . Chronic back pain   . Coronary artery disease   . Hepatitis C virus infection without hepatic coma    received treatment. clear now  . Hypertension   . Kidney stones     Surgical History: Past Surgical History:  Procedure Laterality Date  . BACK SURGERY    . CARDIAC CATHETERIZATION    . CHOLECYSTECTOMY    . COLONOSCOPY WITH PROPOFOL N/A 03/29/2017   Procedure: COLONOSCOPY WITH PROPOFOL;  Surgeon: Lucilla Lame, MD;  Location[facility] ENDOSCOPY;  Service: Endoscopy;  Laterality: N/A;  . ESOPHAGOGASTRODUODENOSCOPY (EGD) WITH PROPOFOL N/A 03/29/2017   Procedure: ESOPHAGOGASTRODUODENOSCOPY (EGD) WITH PROPOFOL;  Surgeon: Lucilla Lame, MD;  Location: [facility] ENDOSCOPY;  Service: Endoscopy;  Laterality: N/A;  . HERNIA REPAIR    . LOWER EXTREMITY ANGIOGRAPHY Right 11/09/2017   Procedure: LOWER EXTREMITY ANGIOGRAPHY;  Surgeon: Katha Cabal, MD;  Location: [place] CV LAB;  Service: Cardiovascular;  Laterality: Right;  . LOWER EXTREMITY ANGIOGRAPHY Right 11/21/2018   Procedure: LOWER EXTREMITY ANGIOGRAPHY;  Surgeon: Katha Cabal, MD;  Location: [place] CV LAB;  Service: Cardiovascular;  Laterality: Right;  . SPINAL FUSION      Home Medications:  Allergies as of 05/02/2019      Reactions   Codeine Itching      Medication List       Accurate as of May 02, 2019 11:56 AM. If you have any questions, ask your nurse or doctor.        STOP taking these medications   Entresto 49-51 MG Generic drug: sacubitril-valsartan Stopped by: Hollice Espy, MD   gabapentin 300 MG capsule Commonly known as: NEURONTIN Stopped by: Hollice Espy, MD     TAKE these medications   ALPRAZolam 1 MG tablet  Commonly known as: XANAX Take 1 mg by mouth 3 (three) times daily.   carvedilol 25 MG tablet Commonly known as: COREG Take 25 mg by mouth 2 (two) times daily.   clopidogrel 75 MG tablet Commonly known as: Plavix Take 1 tablet (75 mg total) by mouth daily.   cyclobenzaprine 10 MG tablet Commonly known as: FLEXERIL Take 10 mg by mouth 2 (two) times daily as needed for muscle spasms.   fluticasone 50 MCG/ACT nasal spray Commonly known as: FLONASE Place 1 spray into both nostrils daily as needed for allergies.   furosemide 20 MG tablet Commonly known as: LASIX Take 20 mg by mouth daily as needed for fluid.   ipratropium-albuterol 0.5-2.5 (3) MG/3ML Soln Commonly known as: DUONEB Take 3 mLs by nebulization every 4 (four) hours as needed (for SOB).   losartan 50 MG tablet Commonly known as: COZAAR Losartan Potassium 50 MG Oral Tablet QTY: 90 tablet Days: 90 Refills: 0  Written: 02/15/19 Patient Instructions: once a day   magnesium hydroxide 400 MG/5ML suspension Commonly known as: MILK OF MAGNESIA Take 15 mLs by mouth daily as needed for mild constipation or indigestion.   meloxicam 15 MG tablet Commonly known as: MOBIC Take 15 mg by mouth daily as needed (for inflammation.).   oxyCODONE-acetaminophen 10-325 MG tablet Commonly known as: PERCOCET Take 1 tablet by mouth 5 (five) times daily.   rosuvastatin 40 MG tablet Commonly known as: CRESTOR Take 40 mg by mouth daily.   tiotropium 18 MCG inhalation capsule Commonly known as: SPIRIVA Place 1 capsule (18 mcg total) into inhaler and inhale every morning.       Allergies:  Allergies  Allergen Reactions  . Codeine Itching    Family History: Family History  Problem Relation Age of Onset  . COPD Mother     Social History:  reports that he quit smoking about 7 years ago. He has never used smokeless tobacco. He reports that he does not drink alcohol or use drugs.  ROS: UROLOGY Frequent Urination?: Yes Hard  to postpone urination?: Yes Burning/pain with urination?: No Get up at night to urinate?: Yes Leakage of urine?: No Urine stream starts and stops?: Yes Trouble starting stream?: Yes Do you have to strain to urinate?: No Blood in urine?: Yes Urinary tract infection?: No Sexually transmitted disease?: No Injury to kidneys or bladder?: No Painful intercourse?: No Weak stream?: No Erection problems?: No Penile pain?: No  Gastrointestinal Nausea?: Yes Vomiting?: No Indigestion/heartburn?: No Diarrhea?: No Constipation?: Yes  Constitutional Fever: No Night sweats?: No Weight loss?: Yes Fatigue?: No  Skin Skin rash/lesions?: No Itching?: No  Eyes Blurred vision?: No Double vision?: No  Ears/Nose/Throat Sore throat?: No Sinus problems?: No  Hematologic/Lymphatic Swollen glands?: No Easy bruising?: No  Cardiovascular Leg swelling?: Yes Chest pain?: Yes  Respiratory Cough?: No Shortness of breath?: No  Endocrine Excessive thirst?: No  Musculoskeletal Back pain?: No Joint pain?: No  Neurological Headaches?: No Dizziness?: No  Psychologic Depression?: Yes Anxiety?: Yes  Physical Exam: BP (!) 85/49   Pulse 63   Ht 5\' 7"  (1.702 m)   Wt 138 lb (62.6 kg)   BMI 21.61 kg/m   Constitutional:  Alert and oriented, No acute distress. HEENT: [place] AT, moist mucus membranes.  Trachea midline, no masses. Cardiovascular: No clubbing, cyanosis, or edema. Respiratory: Normal respiratory effort, no increased work of breathing. GI: Abdomen is soft, nontender, nondistended, no abdominal masses GU: No CVA tenderness Skin: No rashes, bruises or suspicious lesions. Neurologic: Grossly intact, no focal deficits, moving all 4 extremities. Psychiatric: Normal mood and affect.  Laboratory Data: Urinalysis today unremarkable, no blood on microscopic evaluation.   Creatinine 2.09 on 03/21/2019.  Pertinent Imaging: See scanned report, unable to review images today   Assessment & Plan:    1. Kidney stones Bilateral kidney stones, presumably increasing in volume over the past 5 years  Review of outside medical records indicate that he was also previously followed by Dr. Kandee Keen at [facility] as well as seen [facility] urology in 2015.  At that time, he is being followed clinically for the stones especially given his history of cardiomyopathy and at the time recent small bowel obstruction.  Unfortunately, not able to actually review the images today.  The report is somewhat unclear whether or not he has obstruction on the left.  I have asked him to go obtain the disc and will follow up together tomorrow via virtual visit discussed how to move forward.  Given that his flank pain is on the right and not the left where he has only nonobstructing stones, it is unlikely that his stones are the cause of his flank pain.  - Urinalysis, Complete  2. Right flank pain As above  Patient does have a history of chronic back pain and narcotics use.  Plan to follow-up after able to actually review the images.  3. 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  He has previously taken potassium citrate in the past under the care of Dr. Kandee Keen but stopped taking this quite some time ago.  He may need a repeat metabolic evaluation for stone prevention as he does appear to be metabolically active.  4. AKI Creatinine appears to be more elevated in the recent past compared to previous, unclear whether or not this is related to obstruction  We will address once available to review scans  Virtual visit tomorrow once images made available  Hollice Espy, MD  [place] [address], [place], [place] [address] [phone]  I spent 45 min with this patient of which greater than 50% was spent in counseling and coordination of care with the patient.  This included extensive review  of records.

## 2019-05-03 ENCOUNTER — Telehealth (INDEPENDENT_AMBULATORY_CARE_PROVIDER_SITE_OTHER): Payer: Medicare Other | Admitting: Urology

## 2019-05-03 LAB — URINALYSIS, COMPLETE
Bilirubin, UA: NEGATIVE
Glucose, UA: NEGATIVE
Ketones, UA: NEGATIVE
Nitrite, UA: NEGATIVE
Specific Gravity, UA: 1.015 (ref 1.005–1.030)
Urobilinogen, Ur: 0.2 mg/dL (ref 0.2–1.0)
pH, UA: 6 (ref 5.0–7.5)

## 2019-05-03 LAB — MICROSCOPIC EXAMINATION: Bacteria, UA: NONE SEEN

## 2019-05-03 NOTE — Progress Notes (Signed)
I reached out to the patient today for virtual visit now that I have received his disc.  Unfortunately, the patient was not able to login to our online application.  He strongly desires to see the images himself as well to help frame our discussion.  As such, we will have him come in on Tuesday for an in person visit.  To my staff-please schedule patient for in person visit on Tuesday at 415.  Hollice Espy, MD

## 2019-05-08 ENCOUNTER — Encounter: Payer: Self-pay | Admitting: Urology

## 2019-05-08 ENCOUNTER — Ambulatory Visit: Payer: Medicare Other | Admitting: Urology

## 2019-05-08 ENCOUNTER — Other Ambulatory Visit: Payer: Self-pay

## 2019-05-08 VITALS — BP 122/69 | HR 61 | Ht 67.0 in | Wt 138.0 lb

## 2019-05-08 DIAGNOSIS — R109 Unspecified abdominal pain: Secondary | ICD-10-CM | POA: Diagnosis not present

## 2019-05-08 DIAGNOSIS — N200 Calculus of kidney: Secondary | ICD-10-CM

## 2019-05-08 DIAGNOSIS — N289 Disorder of kidney and ureter, unspecified: Secondary | ICD-10-CM

## 2019-05-08 DIAGNOSIS — N1330 Unspecified hydronephrosis: Secondary | ICD-10-CM | POA: Diagnosis not present

## 2019-05-08 NOTE — H&P (View-Only) (Signed)
05/08/2019 4:22 PM   [name] 07-31-1955 AS:[id number]  Referring provider: Perrin Maltese, MD [name],  [name] [id number]  Chief Complaint  Patient presents with  . Results    HPI: 63 year old male with personal history of recurrent nephrolithiasis returns today to discuss his CT scan findings.  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  On the left, there is what appears to be partial obstruction of the left upper pole as the stone material extends into the renal pelvis and up towards the upper pole as a likely cause.  There is no ureteral stones identified.  Unable to calculate Hounsfield units on this version of this scan on disc.  He continues to have intermittent right flank pain which is dull.  Notably more recently, his creatinine has been rising over the past 6 months now up to 2.09 as of 03/21/2019.  He has a personal history of recurrent nephrolithiasis, presumably cystine stone.  He was previously followed both at [facility] and in [place] for these.  He has had several PCNL procedures in the past with various complications including hematoma and pneumothorax.  He does have a significant cardiac history is followed by Dr. Raliegh Ip ahn.  His ejection fraction was relatively low in the past but in the more recent years, it has improved.  He is stabilized from a cardiac standpoint.   PMH: Past Medical History:  Diagnosis Date  . Arthritis    knees, lower back  . Cardiomyopathy ([place])   . CHF (congestive heart failure) ([place])   . Chronic back pain   . Coronary artery disease   . Hepatitis C virus infection without hepatic coma    received treatment. clear now  . Hypertension   . Kidney stones     Surgical History: Past Surgical History:  Procedure Laterality Date  . BACK SURGERY    . CARDIAC CATHETERIZATION    . CHOLECYSTECTOMY    . COLONOSCOPY WITH PROPOFOL N/A 03/29/2017   Procedure: COLONOSCOPY WITH PROPOFOL;  Surgeon: Lucilla Lame, MD;  Location: [facility] ENDOSCOPY;  Service: Endoscopy;  Laterality: N/A;  . ESOPHAGOGASTRODUODENOSCOPY (EGD) WITH PROPOFOL N/A 03/29/2017   Procedure: ESOPHAGOGASTRODUODENOSCOPY (EGD) WITH PROPOFOL;  Surgeon: Lucilla Lame, MD;  Location: [facility] ENDOSCOPY;  Service: Endoscopy;  Laterality: N/A;  . HERNIA REPAIR    . LOWER EXTREMITY ANGIOGRAPHY Right 11/09/2017   Procedure: LOWER EXTREMITY ANGIOGRAPHY;  Surgeon: Katha Cabal, MD;  Location: [place] CV LAB;  Service: Cardiovascular;  Laterality: Right;  . LOWER EXTREMITY ANGIOGRAPHY Right 11/21/2018   Procedure: LOWER EXTREMITY ANGIOGRAPHY;  Surgeon: Katha Cabal, MD;  Location: [place] CV LAB;  Service: Cardiovascular;  Laterality: Right;  . SPINAL FUSION      Home Medications:  Allergies as of 05/08/2019      Reactions   Codeine Itching      Medication List       Accurate as of May 08, 2019  4:22 PM. If you have any questions, ask your nurse or doctor.        ALPRAZolam 1 MG tablet Commonly known as: XANAX Take 1 mg by mouth 3 (three) times daily.   carvedilol 25 MG tablet Commonly known as: COREG Take 25 mg by mouth 2 (two) times daily.   clopidogrel 75 MG tablet Commonly known as: Plavix Take 1 tablet (75 mg total) by mouth daily.   cyclobenzaprine 10 MG tablet Commonly known  as: FLEXERIL Take 10 mg by mouth 2 (two) times daily as needed for muscle spasms.   fluticasone 50 MCG/ACT nasal spray Commonly known as: FLONASE Place 1 spray into both nostrils daily as needed for allergies.   furosemide 20 MG tablet Commonly known as: LASIX Take 20 mg by mouth daily as needed for fluid.   ipratropium-albuterol 0.5-2.5 (3) MG/3ML Soln Commonly known as: DUONEB Take 3 mLs by nebulization every 4 (four) hours as needed (for SOB).   losartan 50 MG tablet Commonly known as: COZAAR Losartan Potassium 50 MG Oral Tablet QTY: 90 tablet Days: 90  Refills: 0  Written: 02/15/19 Patient Instructions: once a day   magnesium hydroxide 400 MG/5ML suspension Commonly known as: MILK OF MAGNESIA Take 15 mLs by mouth daily as needed for mild constipation or indigestion.   meloxicam 15 MG tablet Commonly known as: MOBIC Take 15 mg by mouth daily as needed (for inflammation.).   oxyCODONE-acetaminophen 10-325 MG tablet Commonly known as: PERCOCET Take 1 tablet by mouth 5 (five) times daily.   rosuvastatin 40 MG tablet Commonly known as: CRESTOR Take 40 mg by mouth daily.   tiotropium 18 MCG inhalation capsule Commonly known as: SPIRIVA Place 1 capsule (18 mcg total) into inhaler and inhale every morning.       Allergies:  Allergies  Allergen Reactions  . Codeine Itching    Family History: Family History  Problem Relation Age of Onset  . COPD Mother     Social History:  reports that he quit smoking about 7 years ago. He has never used smokeless tobacco. He reports that he does not drink alcohol or use drugs.  ROS: UROLOGY Frequent Urination?: No Hard to postpone urination?: No Burning/pain with urination?: No Get up at night to urinate?: Yes Leakage of urine?: No Urine stream starts and stops?: Yes Trouble starting stream?: Yes Do you have to strain to urinate?: No Blood in urine?: Yes Urinary tract infection?: No Sexually transmitted disease?: No Injury to kidneys or bladder?: No Painful intercourse?: No Weak stream?: No Erection problems?: No Penile pain?: No  Gastrointestinal Nausea?: No Vomiting?: No Indigestion/heartburn?: No Diarrhea?: No Constipation?: Yes  Constitutional Fever: No Night sweats?: No Weight loss?: Yes Fatigue?: Yes  Skin Skin rash/lesions?: No Itching?: No  Eyes Blurred vision?: Yes Double vision?: No  Ears/Nose/Throat Sore throat?: No Sinus problems?: Yes  Hematologic/Lymphatic Swollen glands?: No Easy bruising?: No  Cardiovascular Leg swelling?: No Chest  pain?: No  Respiratory Cough?: No Shortness of breath?: No  Endocrine Excessive thirst?: Yes  Musculoskeletal Back pain?: Yes Joint pain?: Yes  Neurological Headaches?: No Dizziness?: Yes  Psychologic Depression?: No Anxiety?: Yes  Physical Exam: BP 122/69   Pulse 61   Ht 5\' 7"  (1.702 m)   Wt 138 lb (62.6 kg)   BMI 21.61 kg/m   Constitutional:  Alert and oriented, No acute distress. HEENT: [place] AT, moist mucus membranes.  Trachea midline, no masses. Cardiovascular: No clubbing, cyanosis, or edema. Respiratory: Normal respiratory effort, no increased work of breathing. Skin: No rashes, bruises or suspicious lesions. Neurologic: Grossly intact, no focal deficits, moving all 4 extremities. Psychiatric: Normal mood and affect.  Laboratory Data: Lab Results  Component Value Date   WBC 17.9 (H) 12/05/2017   HGB 13.7 12/05/2017   HCT 42.4 12/05/2017   MCV 80.5 12/05/2017   PLT 262 12/05/2017    Lab Results  Component Value Date   CREATININE 1.88 (H) 11/21/2018    Urinalysis    Component Value  Date/Time   COLORURINE Amber 03/23/2014 1721   COLORURINE YELLOW 08/07/2007 1255   APPEARANCEUR Clear 05/02/2019 1153   LABSPEC 1.032 03/23/2014 1721   PHURINE 5.0 03/23/2014 1721   PHURINE 7.0 08/07/2007 1255   GLUCOSEU Negative 05/02/2019 1153   GLUCOSEU Negative 03/23/2014 1721   HGBUR Negative 03/23/2014 1721   HGBUR NEGATIVE 08/07/2007 1255   BILIRUBINUR Negative 05/02/2019 1153   BILIRUBINUR Negative 03/23/2014 1721   KETONESUR Negative 03/23/2014 1721   KETONESUR NEGATIVE 08/07/2007 1255   PROTEINUR 1+ (A) 05/02/2019 1153   PROTEINUR 100 mg/dL 03/23/2014 1721   PROTEINUR NEGATIVE 08/07/2007 1255   UROBILINOGEN 0.2 08/07/2007 1255   NITRITE Negative 05/02/2019 1153   NITRITE Negative 03/23/2014 1721   NITRITE NEGATIVE 08/07/2007 1255   LEUKOCYTESUR Trace (A) 05/02/2019 1153   LEUKOCYTESUR Trace 03/23/2014 1721    Lab Results  Component Value Date    LABMICR See below: 05/02/2019   WBCUA 0-5 05/02/2019   LABEPIT 0-10 05/02/2019   BACTERIA None seen 05/02/2019    Pertinent Imaging:      CT scan images were personally reviewed today and with the patient.  Assessment & Plan:    1. Staghorn calculus Lateral partial staghorn calculi, presumably cystine based on patient's history  On the left side, the stone is significantly more sizable and appears to be causing obstruction of the left upper pole as it extends through the renal pelvis.  It is unclear whether or not this is causing his worsening renal function.  In the setting of enlarging stone burden and presumably new left upper pole hydronephrosis, I have recommended that we address his left-sided stone burden in the form of PCNL.  We discussed the risk of the procedure at length including the risk of need for multiple procedures especially in the setting of his very hard stone, bleeding, infection, possible blood transfusion, damage to surrounding structures amongst others.  He is very familiar with the procedure and risk.  He understands the postoperative course requiring observation nephrostomy tube and Foley catheter.  He will need cardiac clearance prior to any procedure.  2. Hydronephrosis of left kidney As above  3. Acute kidney insufficiency As above  4. Right flank pain Given the absence of obstruction, unlikely related to chronic stone Suspect MSK etiology   Hollice Espy, MD  [facility] [address], [place], [place] [address] [phone]  I spent 25 min with this patient of which greater than 50% was spent in counseling and coordination of care with the patient.

## 2019-05-08 NOTE — Progress Notes (Signed)
05/08/2019 4:22 PM   [name] 1956/03/23 [id number]  Referring provider: Perrin Maltese, MD [place],  [place] [address]  Chief Complaint  Patient presents with  . Results    HPI: 63 year old male with personal history of recurrent nephrolithiasis returns today to discuss his CT scan findings.  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  On the left, there is what appears to be partial obstruction of the left upper pole as the stone material extends into the renal pelvis and up towards the upper pole as a likely cause.  There is no ureteral stones identified.  Unable to calculate Hounsfield units on this version of this scan on disc.  He continues to have intermittent right flank pain which is dull.  Notably more recently, his creatinine has been rising over the past 6 months now up to 2.09 as of 03/21/2019.  He has a personal history of recurrent nephrolithiasis, presumably cystine stone.  He was previously followed both at [facility] and in [place] for these.  He has had several PCNL procedures in the past with various complications including hematoma and pneumothorax.  He does have a significant cardiac history is followed by Dr. Raliegh Ip ahn.  His ejection fraction was relatively low in the past but in the more recent years, it has improved.  He is stabilized from a cardiac standpoint.   PMH: Past Medical History:  Diagnosis Date  . Arthritis    knees, lower back  . Cardiomyopathy ([place])   . CHF (congestive heart failure) ([place])   . Chronic back pain   . Coronary artery disease   . Hepatitis C virus infection without hepatic coma    received treatment. clear now  . Hypertension   . Kidney stones     Surgical History: Past Surgical History:  Procedure Laterality Date  . BACK SURGERY    . CARDIAC CATHETERIZATION    . CHOLECYSTECTOMY    . COLONOSCOPY WITH PROPOFOL N/A 03/29/2017   Procedure: COLONOSCOPY WITH PROPOFOL;  Surgeon: Lucilla Lame, MD;  Location: [facility] ENDOSCOPY;  Service: Endoscopy;  Laterality: N/A;  . ESOPHAGOGASTRODUODENOSCOPY (EGD) WITH PROPOFOL N/A 03/29/2017   Procedure: ESOPHAGOGASTRODUODENOSCOPY (EGD) WITH PROPOFOL;  Surgeon: Lucilla Lame, MD;  Location: [facility] ENDOSCOPY;  Service: Endoscopy;  Laterality: N/A;  . HERNIA REPAIR    . LOWER EXTREMITY ANGIOGRAPHY Right 11/09/2017   Procedure: LOWER EXTREMITY ANGIOGRAPHY;  Surgeon: Katha Cabal, MD;  Location: [place] CV LAB;  Service: Cardiovascular;  Laterality: Right;  . LOWER EXTREMITY ANGIOGRAPHY Right 11/21/2018   Procedure: LOWER EXTREMITY ANGIOGRAPHY;  Surgeon: Katha Cabal, MD;  Location: [place] CV LAB;  Service: Cardiovascular;  Laterality: Right;  . SPINAL FUSION      Home Medications:  Allergies as of 05/08/2019      Reactions   Codeine Itching      Medication List       Accurate as of May 08, 2019  4:22 PM. If you have any questions, ask your nurse or doctor.        ALPRAZolam 1 MG tablet Commonly known as: XANAX Take 1 mg by mouth 3 (three) times daily.   carvedilol 25 MG tablet Commonly known as: COREG Take 25 mg by mouth 2 (two) times daily.   clopidogrel 75 MG tablet Commonly known as: Plavix Take 1 tablet (75 mg total) by mouth daily.   cyclobenzaprine 10 MG tablet Commonly known  as: FLEXERIL Take 10 mg by mouth 2 (two) times daily as needed for muscle spasms.   fluticasone 50 MCG/ACT nasal spray Commonly known as: FLONASE Place 1 spray into both nostrils daily as needed for allergies.   furosemide 20 MG tablet Commonly known as: LASIX Take 20 mg by mouth daily as needed for fluid.   ipratropium-albuterol 0.5-2.5 (3) MG/3ML Soln Commonly known as: DUONEB Take 3 mLs by nebulization every 4 (four) hours as needed (for SOB).   losartan 50 MG tablet Commonly known as: COZAAR Losartan Potassium 50 MG Oral Tablet QTY: 90 tablet Days: 90  Refills: 0  Written: 02/15/19 Patient Instructions: once a day   magnesium hydroxide 400 MG/5ML suspension Commonly known as: MILK OF MAGNESIA Take 15 mLs by mouth daily as needed for mild constipation or indigestion.   meloxicam 15 MG tablet Commonly known as: MOBIC Take 15 mg by mouth daily as needed (for inflammation.).   oxyCODONE-acetaminophen 10-325 MG tablet Commonly known as: PERCOCET Take 1 tablet by mouth 5 (five) times daily.   rosuvastatin 40 MG tablet Commonly known as: CRESTOR Take 40 mg by mouth daily.   tiotropium 18 MCG inhalation capsule Commonly known as: SPIRIVA Place 1 capsule (18 mcg total) into inhaler and inhale every morning.       Allergies:  Allergies  Allergen Reactions  . Codeine Itching    Family History: Family History  Problem Relation Age of Onset  . COPD Mother     Social History:  reports that he quit smoking about 7 years ago. He has never used smokeless tobacco. He reports that he does not drink alcohol or use drugs.  ROS: UROLOGY Frequent Urination?: No Hard to postpone urination?: No Burning/pain with urination?: No Get up at night to urinate?: Yes Leakage of urine?: No Urine stream starts and stops?: Yes Trouble starting stream?: Yes Do you have to strain to urinate?: No Blood in urine?: Yes Urinary tract infection?: No Sexually transmitted disease?: No Injury to kidneys or bladder?: No Painful intercourse?: No Weak stream?: No Erection problems?: No Penile pain?: No  Gastrointestinal Nausea?: No Vomiting?: No Indigestion/heartburn?: No Diarrhea?: No Constipation?: Yes  Constitutional Fever: No Night sweats?: No Weight loss?: Yes Fatigue?: Yes  Skin Skin rash/lesions?: No Itching?: No  Eyes Blurred vision?: Yes Double vision?: No  Ears/Nose/Throat Sore throat?: No Sinus problems?: Yes  Hematologic/Lymphatic Swollen glands?: No Easy bruising?: No  Cardiovascular Leg swelling?: No Chest  pain?: No  Respiratory Cough?: No Shortness of breath?: No  Endocrine Excessive thirst?: Yes  Musculoskeletal Back pain?: Yes Joint pain?: Yes  Neurological Headaches?: No Dizziness?: Yes  Psychologic Depression?: No Anxiety?: Yes  Physical Exam: BP 122/69   Pulse 61   Ht 5\' 7"  (1.702 m)   Wt 138 lb (62.6 kg)   BMI 21.61 kg/m   Constitutional:  Alert and oriented, No acute distress. HEENT: [place] AT, moist mucus membranes.  Trachea midline, no masses. Cardiovascular: No clubbing, cyanosis, or edema. Respiratory: Normal respiratory effort, no increased work of breathing. Skin: No rashes, bruises or suspicious lesions. Neurologic: Grossly intact, no focal deficits, moving all 4 extremities. Psychiatric: Normal mood and affect.  Laboratory Data: Lab Results  Component Value Date   WBC 17.9 (H) 12/05/2017   HGB 13.7 12/05/2017   HCT 42.4 12/05/2017   MCV 80.5 12/05/2017   PLT 262 12/05/2017    Lab Results  Component Value Date   CREATININE 1.88 (H) 11/21/2018    Urinalysis    Component Value  Date/Time   COLORURINE Amber 03/23/2014 1721   COLORURINE YELLOW 08/07/2007 1255   APPEARANCEUR Clear 05/02/2019 1153   LABSPEC 1.032 03/23/2014 1721   PHURINE 5.0 03/23/2014 1721   PHURINE 7.0 08/07/2007 1255   GLUCOSEU Negative 05/02/2019 1153   GLUCOSEU Negative 03/23/2014 1721   HGBUR Negative 03/23/2014 1721   HGBUR NEGATIVE 08/07/2007 1255   BILIRUBINUR Negative 05/02/2019 1153   BILIRUBINUR Negative 03/23/2014 1721   KETONESUR Negative 03/23/2014 1721   KETONESUR NEGATIVE 08/07/2007 1255   PROTEINUR 1+ (A) 05/02/2019 1153   PROTEINUR 100 mg/dL 03/23/2014 1721   PROTEINUR NEGATIVE 08/07/2007 1255   UROBILINOGEN 0.2 08/07/2007 1255   NITRITE Negative 05/02/2019 1153   NITRITE Negative 03/23/2014 1721   NITRITE NEGATIVE 08/07/2007 1255   LEUKOCYTESUR Trace (A) 05/02/2019 1153   LEUKOCYTESUR Trace 03/23/2014 1721    Lab Results  Component Value Date    LABMICR See below: 05/02/2019   WBCUA 0-5 05/02/2019   LABEPIT 0-10 05/02/2019   BACTERIA None seen 05/02/2019    Pertinent Imaging:      CT scan images were personally reviewed today and with the patient.  Assessment & Plan:    1. Staghorn calculus Lateral partial staghorn calculi, presumably cystine based on patient's history  On the left side, the stone is significantly more sizable and appears to be causing obstruction of the left upper pole as it extends through the renal pelvis.  It is unclear whether or not this is causing his worsening renal function.  In the setting of enlarging stone burden and presumably new left upper pole hydronephrosis, I have recommended that we address his left-sided stone burden in the form of PCNL.  We discussed the risk of the procedure at length including the risk of need for multiple procedures especially in the setting of his very hard stone, bleeding, infection, possible blood transfusion, damage to surrounding structures amongst others.  He is very familiar with the procedure and risk.  He understands the postoperative course requiring observation nephrostomy tube and Foley catheter.  He will need cardiac clearance prior to any procedure.  2. Hydronephrosis of left kidney As above  3. Acute kidney insufficiency As above  4. Right flank pain Given the absence of obstruction, unlikely related to chronic stone Suspect MSK etiology   Hollice Espy, MD  [place] [address] [place], [place] [address] [phone]  I spent 25 min with this patient of which greater than 50% was spent in counseling and coordination of care with the patient.

## 2019-05-08 NOTE — Patient Instructions (Signed)
Percutaneous Nephrolithotomy Percutaneous nephrolithotomy is a procedure to remove kidney stones. Kidney stones are deposits that form inside your kidneys and can cause pain. You may need this procedure if:  You have large kidney stones. Kidney stones that are bigger than 2 cm (0.78 in.) wide may require this procedure.  Your kidney stones are oddly shaped.  Other treatments have not been successful in helping the kidney stones to pass.  You have developed an infection due to the kidney stones. Tell a health care provider about:  Any allergies you have.  All medicines you are taking, including vitamins, herbs, eye drops, creams, and over-the-counter medicines.  Any problems you or family members have had with anesthetic medicines.  Any blood disorders you have.  Any surgeries you have had.  Any medical conditions you have.  Whether you are pregnant or may be pregnant.  Whether you use any tobacco products, including cigarettes, chewing tobacco, or e-cigarettes. What are the risks? Generally, this is a safe procedure. However, problems may occur, including:  Infection.  Bleeding. This may include blood in your urine.  Allergic reactions to medicines.  Damage to other structures or organs.  Kidney damage.  Holes in the kidney. These often heal on their own.  Numbness or tingling in the affected area.  Inability to remove all the stones. You may need a different procedure to complete treatment. What happens before the procedure? Staying hydrated Follow instructions from your health care provider about hydration, which may include:  Up to 2 hours before the procedure - you may continue to drink clear liquids, such as water, clear fruit juice, black coffee, and plain tea.  Eating and drinking restrictions Follow instructions from your health care provider about eating and drinking, which may include:  8 hours before the procedure - stop eating heavy meals or foods,  such as meat, fried foods, or fatty foods.  6 hours before the procedure - stop eating light meals or foods, such as toast or cereal.  6 hours before the procedure - stop drinking milk or drinks that contain milk.  2 hours before the procedure - stop drinking clear liquids. Medicines Ask your health care provider about:  Changing or stopping your regular medicines. This is especially important if you are taking diabetes medicines or blood thinners.  Taking medicines such as aspirin and ibuprofen. These medicines can thin your blood. Do not take these medicines unless your health care provider tells you to take them.  Taking over-the-counter medicines, vitamins, herbs, and supplements. Tests You may have tests, including:  Blood tests.  Urine tests.  Tests to check how your heart is working.  Imaging studies. These are used to identify: ? The size and number (stone burden) of the kidney stones. ? The position of the kidney stones. General instructions  Plan to have someone take you home from the hospital or clinic.  Plan to have a responsible adult care for you for at least 24 hours after you leave the hospital or clinic. This is important.  Ask your health care provider how your surgical site will be marked or identified.  Ask your health care provider what steps will be taken to help prevent infection. These may include: ? Removing hair at the surgery site. ? Washing skin with a germ-killing soap. ? Taking antibiotic medicine. What happens during the procedure?   An IV will be inserted into one of your veins.  The site of the procedure will be marked.  You will be  given one or more of the following: ? A medicine to help you relax (sedative). ? A medicine to numb the area (local anesthetic). ? A medicine to make you fall asleep (general anesthetic). ? A medicine that is injected into your spine to numb the area below and slightly above the injection site (spinal  anesthetic). ? A medicine that is injected into an area of your body to numb everything below the injection site (regional anesthetic).  A thin tube (urinary catheter) will be put in your bladder to drain urine during and after the procedure.  Your surgeon will make a small cut (incision) in your lower back.  A tube will be inserted through the incision into your kidney.  Each kidney stone will be removed through this tube. Larger stones may need to be broken up with a high-intensity light beam (laser) or other tools.  After all of the stones have been removed, your health care provider may put in tubes to drain your bladder. Based on your condition: ? An internal tube, called a stent, may be put in your ureter. This will help drain urine from your kidney to your bladder. ? A surgical drain (nephrostomy tube) may be put in your kidney. The tube comes out through the incision in your lower back. This will help to drain urine or any fluid that builds up while your kidney heals.  Part of the incision may be closed with stitches (sutures).  A bandage (dressing) will be placed over the incision area. The procedure may vary among health care providers and hospitals. What happens after the procedure?  Your blood pressure, heart rate, breathing rate, and blood oxygen level will be monitored until you leave the hospital or clinic.  You may be given medicine for pain.  You will be shown how to do breathing exercises, such as coughing and breathing deeply. These will help to prevent pneumonia.  You will be encouraged to walk. Walking helps to prevent blood clots.  Your stent and urinary catheter will be removed after 1-2 days if there is only a small amount of blood in your urine.  You will be taught how to care for the catheter or nephrostomy tube, if you have them.  Do not drive for 24 hours if you were given a sedative during your procedure. Summary  Percutaneous nephrolithotomy is a  procedure to remove kidney stones.  Ask your health care provider about changing or stopping your regular medicines.  Before surgery, follow instructions from your health care provider about eating and drinking.  Plan to have someone take you home from the hospital or clinic. This information is not intended to replace advice given to you by your health care provider. Make sure you discuss any questions you have with your health care provider. Document Released: 04/25/2009 Document Revised: 08/24/2018 Document Reviewed: 01/18/2018 Elsevier Patient Education  2020 [employer].

## 2019-05-09 ENCOUNTER — Other Ambulatory Visit (INDEPENDENT_AMBULATORY_CARE_PROVIDER_SITE_OTHER): Payer: Self-pay

## 2019-05-09 ENCOUNTER — Other Ambulatory Visit: Payer: Self-pay | Admitting: Radiology

## 2019-05-09 DIAGNOSIS — N200 Calculus of kidney: Secondary | ICD-10-CM

## 2019-05-17 ENCOUNTER — Other Ambulatory Visit: Payer: Medicare Other

## 2019-05-17 ENCOUNTER — Other Ambulatory Visit: Payer: Self-pay

## 2019-05-17 DIAGNOSIS — N200 Calculus of kidney: Secondary | ICD-10-CM

## 2019-05-18 LAB — URINALYSIS, COMPLETE
Bilirubin, UA: NEGATIVE
Glucose, UA: NEGATIVE
Ketones, UA: NEGATIVE
Leukocytes,UA: NEGATIVE
Nitrite, UA: NEGATIVE
Protein,UA: NEGATIVE
Specific Gravity, UA: 1.015 (ref 1.005–1.030)
Urobilinogen, Ur: 0.2 mg/dL (ref 0.2–1.0)
pH, UA: 5.5 (ref 5.0–7.5)

## 2019-05-18 LAB — MICROSCOPIC EXAMINATION
Bacteria, UA: NONE SEEN
RBC, Urine: >30 /hpf — AB (ref 0–2)

## 2019-05-20 LAB — CULTURE, URINE COMPREHENSIVE

## 2019-05-22 ENCOUNTER — Other Ambulatory Visit: Payer: Self-pay

## 2019-05-22 ENCOUNTER — Encounter
Admission: RE | Admit: 2019-05-22 | Discharge: 2019-05-22 | Disposition: A | Discharge status: Home / Self Care | Payer: Medicare Other | Source: Ambulatory Visit | Attending: Urology | Admitting: Urology

## 2019-05-22 DIAGNOSIS — I509 Heart failure, unspecified: Secondary | ICD-10-CM | POA: Diagnosis not present

## 2019-05-22 DIAGNOSIS — Z01818 Encounter for other preprocedural examination: Secondary | ICD-10-CM | POA: Diagnosis not present

## 2019-05-22 HISTORY — DX: Personal history of urinary calculi: Z87.442

## 2019-05-22 HISTORY — DX: Other complications of anesthesia, initial encounter: T88.59XA

## 2019-05-22 HISTORY — DX: Bronchitis, not specified as acute or chronic: J40

## 2019-05-22 LAB — PROTIME-INR
INR: 1 (ref 0.8–1.2)
Prothrombin Time: 13.1 seconds (ref 11.4–15.2)

## 2019-05-22 LAB — CBC
HCT: 35.8 % — ABNORMAL LOW (ref 39.0–52.0)
Hemoglobin: 11.4 g/dL — ABNORMAL LOW (ref 13.0–17.0)
MCH: 25.7 pg — ABNORMAL LOW (ref 26.0–34.0)
MCHC: 31.8 g/dL (ref 30.0–36.0)
MCV: 80.6 fL (ref 80.0–100.0)
Platelets: 248 10*3/uL (ref 150–400)
RBC: 4.44 MIL/uL (ref 4.22–5.81)
RDW: 15.6 % — ABNORMAL HIGH (ref 11.5–15.5)
WBC: 7.4 10*3/uL (ref 4.0–10.5)
nRBC: 0 % (ref 0.0–0.2)

## 2019-05-22 LAB — BASIC METABOLIC PANEL
Anion gap: 8 (ref 5–15)
BUN: 52 mg/dL — ABNORMAL HIGH (ref 8–23)
CO2: 26 mmol/L (ref 22–32)
Calcium: 9.1 mg/dL (ref 8.9–10.3)
Chloride: 105 mmol/L (ref 98–111)
Creatinine, Ser: 1.83 mg/dL — ABNORMAL HIGH (ref 0.61–1.24)
GFR calc Af Amer: 45 mL/min — ABNORMAL LOW (ref >60)
GFR calc non Af Amer: 39 mL/min — ABNORMAL LOW (ref >60)
Glucose, Bld: 96 mg/dL (ref 70–99)
Potassium: 4.7 mmol/L (ref 3.5–5.1)
Sodium: 139 mmol/L (ref 135–145)

## 2019-05-22 LAB — TYPE AND SCREEN
ABO/RH(D): A POS
Antibody Screen: NEGATIVE

## 2019-05-22 NOTE — Patient Instructions (Addendum)
Your procedure is scheduled on: 05-28-19 MONDAY Report to Same Day Surgery 2nd floor medical mall [facility] Entrance-take elevator on left to 2nd floor.  Check in with surgery information desk.) To find out your arrival time please call [phone] between 1PM - 3PM on 05-25-19 FRIDAY  Remember: Instructions that are not followed completely may result in serious medical risk, up to and including death, or upon the discretion of your surgeon and anesthesiologist your surgery may need to be rescheduled.    _x___ 1. Do not eat food after midnight the night before your procedure. NO GUM OR CANDY AFTER MIDNIGHT. You may drink clear liquids up to 2 hours before you are scheduled to arrive at the hospital for your procedure.  Do not drink clear liquids within 2 hours of your scheduled arrival to the hospital.  Clear liquids include  --Water or Apple juice without pulp  --Gatorade  --Black Coffee or Clear Tea (No milk, no creamers, do not add anything to the coffee or Tea   ____Ensure clear carbohydrate drink on the way to the hospital for bariatric patients  ____Ensure clear carbohydrate drink 3 hours before surgery.     __x__ 2. No Alcohol for 24 hours before or after surgery.   __x__3. No Smoking or e-cigarettes for 24 prior to surgery.  Do not use any chewable tobacco products for at least 6 hour prior to surgery   ____  4. Bring all medications with you on the day of surgery if instructed.    __x__ 5. Notify your doctor if there is any change in your medical condition     (cold, fever, infections).    x___6. On the morning of surgery brush your teeth with toothpaste and water.  You may rinse your mouth with mouth wash if you wish.  Do not swallow any toothpaste or mouthwash.   Do not wear jewelry, make-up, hairpins, clips or nail polish.  Do not wear lotions, powders, or perfumes.  Do not shave 48 hours prior to surgery. Men may shave face and neck.  Do not bring valuables to  the hospital.    [facility] is not responsible for any belongings or valuables.               Contacts, dentures or bridgework may not be worn into surgery.  Leave your suitcase in the car. After surgery it may be brought to your room.  For patients admitted to the hospital, discharge time is determined by your treatment team.  _  Patients discharged the day of surgery will not be allowed to drive home.  You will need someone to drive you home and stay with you the night of your procedure.    Please read over the following fact sheets that you were given:   [facility] Preparing for Surgery and or MRSA Information   _x___ TAKE THE FOLLOWING MEDICATION THE MORNING OF SURGERY WITH A SMALL SIP OF WATER. These include:  1. COREG (CARVEDILOL)  2. PROZAC (FLUOXETINE)  3. OXYCODONE   4. YOU MAY TAKE YOUR XANAX DAY OF SURGERY IF NEEDED  5.  6.  ____Fleets enema or Magnesium Citrate as directed.   _x___ Use CHG Soap or sage wipes as directed on instruction sheet   ____ Use inhalers on the day of surgery and bring to hospital day of surgery  ____ Stop Metformin and Janumet 2 days prior to surgery.    ____ Take 1/2 of usual insulin dose the night before  surgery and none on the morning surgery.   _x___ Follow recommendations from Cardiologist, Pulmonologist or PCP regarding  stopping Aspirin, Coumadin, Plavix ,Eliquis, Effient, or Pradaxa, and Pletal-PT WAS INSTRUCTED BY OFFICE TO STOP PLAVIX 1 WEEK PRIOR TO SURGERY  X____Stop Anti-inflammatories such as Advil, Aleve, Ibuprofen, Motrin, Naproxen,MELOXICAM (MOBIC) Naprosyn, Goodies powders or aspirin products NOW-OK to take Tylenol    ____ Stop supplements until after surgery.     ____ Bring C-Pap to the hospital.

## 2019-05-24 ENCOUNTER — Other Ambulatory Visit: Payer: Self-pay

## 2019-05-24 ENCOUNTER — Other Ambulatory Visit
Admission: RE | Admit: 2019-05-24 | Discharge: 2019-05-24 | Disposition: A | Discharge status: Home / Self Care | Payer: Medicare Other | Source: Ambulatory Visit | Attending: Urology | Admitting: Urology

## 2019-05-24 ENCOUNTER — Telehealth: Payer: Medicare Other | Admitting: Urology

## 2019-05-24 DIAGNOSIS — Z20828 Contact with and (suspected) exposure to other viral communicable diseases: Secondary | ICD-10-CM | POA: Diagnosis not present

## 2019-05-24 DIAGNOSIS — Z01812 Encounter for preprocedural laboratory examination: Secondary | ICD-10-CM | POA: Insufficient documentation

## 2019-05-24 LAB — SARS CORONAVIRUS 2 (TAT 6-24 HRS): SARS Coronavirus 2: NEGATIVE

## 2019-05-25 ENCOUNTER — Other Ambulatory Visit: Payer: Self-pay | Admitting: Student

## 2019-05-25 NOTE — Pre-Procedure Instructions (Signed)
Cardiac Clearance on chart from Dr Earlyne Iba Khan-moderate risk

## 2019-05-27 MED ORDER — CIPROFLOXACIN IN D5W 400 MG/200ML IV SOLN
400.0000 mg | Freq: Once | INTRAVENOUS | Status: AC
  Administered 2019-05-28: 11:00:00 400 mg via INTRAVENOUS

## 2019-05-28 ENCOUNTER — Encounter
Admission: RE | Disposition: A | Discharge status: Home / Self Care | Payer: Self-pay | Source: Home / Self Care | Attending: Urology

## 2019-05-28 ENCOUNTER — Ambulatory Visit: Payer: Medicare Other | Admitting: Registered Nurse

## 2019-05-28 ENCOUNTER — Ambulatory Visit: Payer: Medicare Other

## 2019-05-28 ENCOUNTER — Ambulatory Visit
Admission: RE | Admit: 2019-05-28 | Discharge: 2019-05-28 | Disposition: A | Discharge status: Home / Self Care | Payer: Medicare Other | Source: Ambulatory Visit | Attending: Urology | Admitting: Urology

## 2019-05-28 ENCOUNTER — Observation Stay
Admission: RE | Admit: 2019-05-28 | Discharge: 2019-05-29 | Disposition: A | Discharge status: Home / Self Care | Payer: Medicare Other | Attending: Urology | Admitting: Urology

## 2019-05-28 ENCOUNTER — Encounter: Payer: Self-pay | Admitting: *Deleted

## 2019-05-28 ENCOUNTER — Other Ambulatory Visit: Payer: Self-pay

## 2019-05-28 DIAGNOSIS — I110 Hypertensive heart disease with heart failure: Secondary | ICD-10-CM | POA: Diagnosis not present

## 2019-05-28 DIAGNOSIS — M170 Bilateral primary osteoarthritis of knee: Secondary | ICD-10-CM | POA: Insufficient documentation

## 2019-05-28 DIAGNOSIS — Z885 Allergy status to narcotic agent status: Secondary | ICD-10-CM | POA: Diagnosis not present

## 2019-05-28 DIAGNOSIS — N200 Calculus of kidney: Secondary | ICD-10-CM

## 2019-05-28 DIAGNOSIS — N289 Disorder of kidney and ureter, unspecified: Secondary | ICD-10-CM | POA: Diagnosis not present

## 2019-05-28 DIAGNOSIS — M479 Spondylosis, unspecified: Secondary | ICD-10-CM | POA: Diagnosis not present

## 2019-05-28 DIAGNOSIS — Z7902 Long term (current) use of antithrombotics/antiplatelets: Secondary | ICD-10-CM | POA: Insufficient documentation

## 2019-05-28 DIAGNOSIS — Z436 Encounter for attention to other artificial openings of urinary tract: Secondary | ICD-10-CM | POA: Insufficient documentation

## 2019-05-28 DIAGNOSIS — I509 Heart failure, unspecified: Secondary | ICD-10-CM | POA: Diagnosis not present

## 2019-05-28 DIAGNOSIS — I43 Cardiomyopathy in diseases classified elsewhere: Secondary | ICD-10-CM | POA: Diagnosis not present

## 2019-05-28 DIAGNOSIS — E7201 Cystinuria: Secondary | ICD-10-CM | POA: Diagnosis not present

## 2019-05-28 DIAGNOSIS — N132 Hydronephrosis with renal and ureteral calculous obstruction: Principal | ICD-10-CM | POA: Insufficient documentation

## 2019-05-28 DIAGNOSIS — I429 Cardiomyopathy, unspecified: Secondary | ICD-10-CM | POA: Insufficient documentation

## 2019-05-28 DIAGNOSIS — Z87891 Personal history of nicotine dependence: Secondary | ICD-10-CM | POA: Insufficient documentation

## 2019-05-28 DIAGNOSIS — I2510 Atherosclerotic heart disease of native coronary artery without angina pectoris: Secondary | ICD-10-CM | POA: Diagnosis not present

## 2019-05-28 DIAGNOSIS — Z79899 Other long term (current) drug therapy: Secondary | ICD-10-CM | POA: Insufficient documentation

## 2019-05-28 DIAGNOSIS — Z419 Encounter for procedure for purposes other than remedying health state, unspecified: Secondary | ICD-10-CM

## 2019-05-28 HISTORY — PX: NEPHROLITHOTOMY: SHX5134

## 2019-05-28 HISTORY — PX: HOLMIUM LASER APPLICATION: SHX5852

## 2019-05-28 HISTORY — PX: IR NEPHROSTOMY PLACEMENT LEFT: IMG6063

## 2019-05-28 LAB — URINE DRUG SCREEN, QUALITATIVE (ARMC ONLY)
Amphetamines, Ur Screen: NOT DETECTED
Barbiturates, Ur Screen: NOT DETECTED
Benzodiazepine, Ur Scrn: POSITIVE — AB
Cannabinoid 50 Ng, Ur ~~LOC~~: NOT DETECTED
Cocaine Metabolite,Ur ~~LOC~~: NOT DETECTED
MDMA (Ecstasy)Ur Screen: NOT DETECTED
Methadone Scn, Ur: NOT DETECTED
Opiate, Ur Screen: NOT DETECTED
Phencyclidine (PCP) Ur S: NOT DETECTED
Tricyclic, Ur Screen: NOT DETECTED

## 2019-05-28 LAB — CBC
HCT: 38.3 % — ABNORMAL LOW (ref 39.0–52.0)
Hemoglobin: 12.1 g/dL — ABNORMAL LOW (ref 13.0–17.0)
MCH: 26.1 pg (ref 26.0–34.0)
MCHC: 31.6 g/dL (ref 30.0–36.0)
MCV: 82.5 fL (ref 80.0–100.0)
Platelets: 235 10*3/uL (ref 150–400)
RBC: 4.64 MIL/uL (ref 4.22–5.81)
RDW: 15.8 % — ABNORMAL HIGH (ref 11.5–15.5)
WBC: 15.3 10*3/uL — ABNORMAL HIGH (ref 4.0–10.5)
nRBC: 0 % (ref 0.0–0.2)

## 2019-05-28 LAB — PROTIME-INR
INR: 1 (ref 0.8–1.2)
Prothrombin Time: 12.9 seconds (ref 11.4–15.2)

## 2019-05-28 SURGERY — NEPHROLITHOTOMY PERCUTANEOUS
Anesthesia: General | Site: Back

## 2019-05-28 MED ORDER — MORPHINE SULFATE (PF) 2 MG/ML IV SOLN
2.0000 mg | INTRAVENOUS | Status: DC | PRN
  Administered 2019-05-28 – 2019-05-29 (×3): 4 mg via INTRAVENOUS
  Filled 2019-05-28 (×3): qty 2

## 2019-05-28 MED ORDER — FLUTICASONE PROPIONATE 50 MCG/ACT NA SUSP
1.0000 | Freq: Every day | NASAL | Status: DC | PRN
  Filled 2019-05-28: qty 16

## 2019-05-28 MED ORDER — DEXAMETHASONE SODIUM PHOSPHATE 10 MG/ML IJ SOLN
INTRAMUSCULAR | Status: DC | PRN
  Administered 2019-05-28: 10 mg via INTRAVENOUS

## 2019-05-28 MED ORDER — CEFAZOLIN SODIUM-DEXTROSE 1-4 GM/50ML-% IV SOLN
INTRAVENOUS | Status: AC | PRN
  Administered 2019-05-28: 2 g via INTRAVENOUS

## 2019-05-28 MED ORDER — LIDOCAINE HCL (CARDIAC) PF 100 MG/5ML IV SOSY
PREFILLED_SYRINGE | INTRAVENOUS | Status: DC | PRN
  Administered 2019-05-28: 100 mg via INTRAVENOUS

## 2019-05-28 MED ORDER — IPRATROPIUM-ALBUTEROL 0.5-2.5 (3) MG/3ML IN SOLN: 3.0000 mL | RESPIRATORY_TRACT | Status: DC | PRN

## 2019-05-28 MED ORDER — ROSUVASTATIN CALCIUM 10 MG PO TABS
40.0000 mg | ORAL_TABLET | Freq: Every evening | ORAL | Status: DC
  Administered 2019-05-28 – 2019-05-29 (×2): 40 mg via ORAL
  Filled 2019-05-28 (×2): qty 4

## 2019-05-28 MED ORDER — DOCUSATE SODIUM 100 MG PO CAPS
100.0000 mg | ORAL_CAPSULE | Freq: Two times a day (BID) | ORAL | Status: DC
  Administered 2019-05-28 – 2019-05-29 (×2): 100 mg via ORAL
  Filled 2019-05-28 (×2): qty 1

## 2019-05-28 MED ORDER — BELLADONNA ALKALOIDS-OPIUM 16.2-60 MG RE SUPP
1.0000 | Freq: Four times a day (QID) | RECTAL | Status: DC | PRN
  Administered 2019-05-28 – 2019-05-29 (×2): 1 via RECTAL
  Filled 2019-05-28 (×2): qty 1

## 2019-05-28 MED ORDER — FENTANYL CITRATE (PF) 100 MCG/2ML IJ SOLN: 25.0000 ug | INTRAMUSCULAR | Status: DC | PRN

## 2019-05-28 MED ORDER — ACETAMINOPHEN 325 MG PO TABS: 650.0000 mg | ORAL_TABLET | ORAL | Status: DC | PRN

## 2019-05-28 MED ORDER — IOHEXOL 180 MG/ML  SOLN
INTRAMUSCULAR | Status: DC | PRN
  Administered 2019-05-28: 60 mL

## 2019-05-28 MED ORDER — DIPHENHYDRAMINE HCL 12.5 MG/5ML PO ELIX
12.5000 mg | ORAL_SOLUTION | Freq: Four times a day (QID) | ORAL | Status: DC | PRN
  Filled 2019-05-28: qty 5

## 2019-05-28 MED ORDER — SODIUM CHLORIDE (PF) 0.9 % IJ SOLN
INTRAMUSCULAR | Status: AC
  Filled 2019-05-28: qty 10

## 2019-05-28 MED ORDER — EPHEDRINE SULFATE 50 MG/ML IJ SOLN
INTRAMUSCULAR | Status: AC
  Filled 2019-05-28: qty 1

## 2019-05-28 MED ORDER — FENTANYL CITRATE (PF) 100 MCG/2ML IJ SOLN
INTRAMUSCULAR | Status: AC | PRN
  Administered 2019-05-28 (×2): 25 ug via INTRAVENOUS

## 2019-05-28 MED ORDER — LOSARTAN POTASSIUM 50 MG PO TABS
50.0000 mg | ORAL_TABLET | ORAL | Status: DC
  Administered 2019-05-29: 06:00:00 50 mg via ORAL
  Filled 2019-05-28: qty 1

## 2019-05-28 MED ORDER — FENTANYL CITRATE (PF) 100 MCG/2ML IJ SOLN
INTRAMUSCULAR | Status: AC
  Filled 2019-05-28: qty 2

## 2019-05-28 MED ORDER — MIDAZOLAM HCL 5 MG/5ML IJ SOLN
INTRAMUSCULAR | Status: AC | PRN
  Administered 2019-05-28 (×2): 1 mg via INTRAVENOUS

## 2019-05-28 MED ORDER — CEFAZOLIN SODIUM-DEXTROSE 2-4 GM/100ML-% IV SOLN: 2.0000 g | INTRAVENOUS | Status: DC

## 2019-05-28 MED ORDER — ONDANSETRON HCL 4 MG/2ML IJ SOLN
INTRAMUSCULAR | Status: DC | PRN
  Administered 2019-05-28: 4 mg via INTRAVENOUS

## 2019-05-28 MED ORDER — EPHEDRINE SULFATE 50 MG/ML IJ SOLN
INTRAMUSCULAR | Status: DC | PRN
  Administered 2019-05-28 (×5): 10 mg via INTRAVENOUS

## 2019-05-28 MED ORDER — KETAMINE HCL 50 MG/ML IJ SOLN
INTRAMUSCULAR | Status: DC | PRN
  Administered 2019-05-28: 50 mg via INTRAMUSCULAR

## 2019-05-28 MED ORDER — FAMOTIDINE 20 MG PO TABS
ORAL_TABLET | ORAL | Status: AC
  Administered 2019-05-28: 10:00:00 20 mg via ORAL
  Filled 2019-05-28: qty 1

## 2019-05-28 MED ORDER — FLUOXETINE HCL 20 MG PO CAPS
40.0000 mg | ORAL_CAPSULE | ORAL | Status: DC
  Administered 2019-05-29: 06:00:00 40 mg via ORAL
  Filled 2019-05-28 (×2): qty 2

## 2019-05-28 MED ORDER — CIPROFLOXACIN IN D5W 400 MG/200ML IV SOLN
INTRAVENOUS | Status: AC
  Filled 2019-05-28: qty 200

## 2019-05-28 MED ORDER — ONDANSETRON HCL 4 MG/2ML IJ SOLN: 4.0000 mg | Freq: Once | INTRAMUSCULAR | Status: DC | PRN

## 2019-05-28 MED ORDER — OXYBUTYNIN CHLORIDE 5 MG PO TABS
5.0000 mg | ORAL_TABLET | Freq: Three times a day (TID) | ORAL | Status: DC | PRN
  Administered 2019-05-29: 5 mg via ORAL
  Filled 2019-05-28: qty 1

## 2019-05-28 MED ORDER — SUGAMMADEX SODIUM 200 MG/2ML IV SOLN
INTRAVENOUS | Status: DC | PRN
  Administered 2019-05-28: 200 mg via INTRAVENOUS

## 2019-05-28 MED ORDER — DIPHENHYDRAMINE HCL 50 MG/ML IJ SOLN: 12.5000 mg | Freq: Four times a day (QID) | INTRAMUSCULAR | Status: DC | PRN

## 2019-05-28 MED ORDER — IODIXANOL 320 MG/ML IV SOLN: 50.0000 mL | Freq: Once | INTRAVENOUS | Status: DC | PRN

## 2019-05-28 MED ORDER — ONDANSETRON HCL 4 MG/2ML IJ SOLN: 4.0000 mg | INTRAMUSCULAR | Status: DC | PRN

## 2019-05-28 MED ORDER — ROCURONIUM BROMIDE 100 MG/10ML IV SOLN
INTRAVENOUS | Status: DC | PRN
  Administered 2019-05-28: 10 mg via INTRAVENOUS
  Administered 2019-05-28: 50 mg via INTRAVENOUS
  Administered 2019-05-28 (×2): 10 mg via INTRAVENOUS

## 2019-05-28 MED ORDER — FENTANYL CITRATE (PF) 100 MCG/2ML IJ SOLN
INTRAMUSCULAR | Status: DC | PRN
  Administered 2019-05-28 (×2): 50 ug via INTRAVENOUS

## 2019-05-28 MED ORDER — PROPOFOL 10 MG/ML IV BOLUS
INTRAVENOUS | Status: AC
  Filled 2019-05-28: qty 20

## 2019-05-28 MED ORDER — CEFAZOLIN SODIUM-DEXTROSE 2-4 GM/100ML-% IV SOLN
INTRAVENOUS | Status: AC
  Filled 2019-05-28: qty 100

## 2019-05-28 MED ORDER — KETAMINE HCL 50 MG/ML IJ SOLN
INTRAMUSCULAR | Status: AC
  Filled 2019-05-28: qty 10

## 2019-05-28 MED ORDER — MAGNESIUM HYDROXIDE 400 MG/5ML PO SUSP: 15.0000 mL | Freq: Every day | ORAL | Status: DC | PRN

## 2019-05-28 MED ORDER — LACTATED RINGERS IV SOLN
INTRAVENOUS | Status: DC
  Administered 2019-05-28: 10:00:00 via INTRAVENOUS

## 2019-05-28 MED ORDER — SPIRONOLACTONE 25 MG PO TABS
25.0000 mg | ORAL_TABLET | Freq: Every day | ORAL | Status: DC
  Filled 2019-05-28: qty 1

## 2019-05-28 MED ORDER — OXYCODONE-ACETAMINOPHEN 5-325 MG PO TABS
1.0000 | ORAL_TABLET | ORAL | Status: DC | PRN
  Administered 2019-05-28 (×2): 2 via ORAL
  Administered 2019-05-29: 12:00:00 1 via ORAL
  Filled 2019-05-28: qty 1
  Filled 2019-05-28 (×2): qty 2

## 2019-05-28 MED ORDER — CEFAZOLIN SODIUM-DEXTROSE 1-4 GM/50ML-% IV SOLN
1.0000 g | Freq: Three times a day (TID) | INTRAVENOUS | Status: AC
  Administered 2019-05-28 – 2019-05-29 (×2): 1 g via INTRAVENOUS
  Filled 2019-05-28 (×2): qty 50

## 2019-05-28 MED ORDER — PROPOFOL 10 MG/ML IV BOLUS
INTRAVENOUS | Status: DC | PRN
  Administered 2019-05-28: 180 mg via INTRAVENOUS

## 2019-05-28 MED ORDER — MIDAZOLAM HCL 2 MG/2ML IJ SOLN
INTRAMUSCULAR | Status: AC
  Filled 2019-05-28: qty 2

## 2019-05-28 MED ORDER — ALPRAZOLAM 0.5 MG PO TABS
1.0000 mg | ORAL_TABLET | Freq: Three times a day (TID) | ORAL | Status: DC | PRN
  Administered 2019-05-28: 1 mg via ORAL
  Filled 2019-05-28: qty 2

## 2019-05-28 MED ORDER — HEPARIN SODIUM (PORCINE) 5000 UNIT/ML IJ SOLN
5000.0000 [IU] | Freq: Three times a day (TID) | INTRAMUSCULAR | Status: DC
  Administered 2019-05-29 (×2): 5000 [IU] via SUBCUTANEOUS
  Filled 2019-05-28 (×2): qty 1

## 2019-05-28 MED ORDER — SODIUM CHLORIDE 0.9 % IV SOLN: INTRAVENOUS | Status: DC

## 2019-05-28 MED ORDER — FAMOTIDINE 20 MG PO TABS
20.0000 mg | ORAL_TABLET | Freq: Once | ORAL | Status: AC
  Administered 2019-05-28: 10:00:00 20 mg via ORAL

## 2019-05-28 MED ORDER — MIDAZOLAM HCL 2 MG/2ML IJ SOLN
INTRAMUSCULAR | Status: DC | PRN
  Administered 2019-05-28: 2 mg via INTRAVENOUS

## 2019-05-28 MED ORDER — SODIUM CHLORIDE 0.9 % IV SOLN
INTRAVENOUS | Status: DC
  Administered 2019-05-28 – 2019-05-29 (×2): via INTRAVENOUS

## 2019-05-28 MED ORDER — CARVEDILOL 25 MG PO TABS
25.0000 mg | ORAL_TABLET | Freq: Two times a day (BID) | ORAL | Status: DC
  Administered 2019-05-28: 21:00:00 25 mg via ORAL
  Filled 2019-05-28 (×2): qty 1

## 2019-05-28 MED ORDER — MIDAZOLAM HCL 5 MG/5ML IJ SOLN
INTRAMUSCULAR | Status: AC
  Filled 2019-05-28: qty 5

## 2019-05-28 MED ORDER — SEVOFLURANE IN SOLN
RESPIRATORY_TRACT | Status: AC
  Filled 2019-05-28: qty 250

## 2019-05-28 SURGICAL SUPPLY — 76 items
ADAPTER IRRIG TUBE 2 SPIKE SOL (ADAPTER) ×8 IMPLANT
ADAPTER SCOPE UROLOK II (MISCELLANEOUS) ×4 IMPLANT
ADPR INSRT BALL FIT URLK2 (MISCELLANEOUS) ×2
ADPR TBG 2 SPK PMP STRL ASCP (ADAPTER) ×4
APL PRP STRL LF DISP 70% ISPRP (MISCELLANEOUS) ×2
BAG DRN RND TRDRP ANRFLXCHMBR (UROLOGICAL SUPPLIES) ×2
BAG URINE DRAIN 2000ML AR STRL (UROLOGICAL SUPPLIES) ×4 IMPLANT
BALLN NEPHROSTOMY 10MMX15CM (UROLOGICAL SUPPLIES) ×1
BALLN NEPHROSTOMY 10X15 (UROLOGICAL SUPPLIES) ×3 IMPLANT
BASKET LASER NITINOL 1.9FR (BASKET) ×2 IMPLANT
BASKET ZERO TIP 1.9FR (BASKET) ×4 IMPLANT
BLADE SURG 15 STRL LF DISP TIS (BLADE) ×2 IMPLANT
BLADE SURG 15 STRL SS (BLADE) ×4
BSKT STON RTRVL 120 1.9FR (BASKET) ×2
BSKT STON RTRVL ZERO TP 1.9FR (BASKET) ×4
CATH COUNCIL 22FR (CATHETERS) IMPLANT
CATH FOLEY 2W COUNCIL 20FR 5CC (CATHETERS) IMPLANT
CATH FOLEY 2W COUNCIL 5CC 18FR (CATHETERS) ×2 IMPLANT
CATH STENT KAYE NEPHR TAMP (CATHETERS) IMPLANT
CATH URETL 5X70 OPEN END (CATHETERS) ×4 IMPLANT
CATH/STENT KAYE NEPHR TAMP (CATHETERS)
CHLORAPREP W/TINT 26 (MISCELLANEOUS) ×4 IMPLANT
CNTNR SPEC 2.5X3XGRAD LEK (MISCELLANEOUS) ×2
CONT SPEC 4OZ STER OR WHT (MISCELLANEOUS) ×2
CONT SPEC 4OZ STRL OR WHT (MISCELLANEOUS) ×2
CONTAINER SPEC 2.5X3XGRAD LEK (MISCELLANEOUS) ×2 IMPLANT
COVER WAND RF STERILE (DRAPES) ×4 IMPLANT
DRAPE 3/4 80X56 (DRAPES) ×4 IMPLANT
DRAPE C-ARM XRAY 36X54 (DRAPES) ×4 IMPLANT
DRAPE SURG 17X11 SM STRL (DRAPES) ×16 IMPLANT
GAUZE SPONGE 4X4 12PLY STRL (GAUZE/BANDAGES/DRESSINGS) ×4 IMPLANT
GLIDEWIRE STIFF .35X180X3 HYDR (WIRE) ×4 IMPLANT
GLOVE BIO SURGEON STRL SZ 6.5 (GLOVE) ×6 IMPLANT
GLOVE BIO SURGEONS STRL SZ 6.5 (GLOVE) ×2
GLOVE BIOGEL PI IND STRL 6.5 (GLOVE) ×4 IMPLANT
GLOVE BIOGEL PI INDICATOR 6.5 (GLOVE) ×4
GOWN STRL REUS W/ TWL LRG LVL3 (GOWN DISPOSABLE) ×4 IMPLANT
GOWN STRL REUS W/TWL LRG LVL3 (GOWN DISPOSABLE) ×8
GUIDEWIRE GREEN .038 145CM (MISCELLANEOUS) ×4 IMPLANT
GUIDEWIRE INTRO SET STRAIGHT (WIRE) ×4 IMPLANT
GUIDEWIRE STR DUAL SENSOR (WIRE) ×4 IMPLANT
GUIDEWIRE STR ZIPWIRE 035X150 (MISCELLANEOUS) IMPLANT
GUIDEWIRE SUPER STIFF (WIRE) ×2 IMPLANT
HOLDER FOLEY CATH W/STRAP (MISCELLANEOUS) ×4 IMPLANT
INTRODUCER DILATOR DOUBLE (INTRODUCER) IMPLANT
JELLY LUB 2OZ STRL (MISCELLANEOUS) ×2
JELLY LUBE 2OZ STRL (MISCELLANEOUS) ×2 IMPLANT
KIT PROBE TRILOGY 3.9X350 (MISCELLANEOUS) ×4 IMPLANT
MANIFOLD NEPTUNE II (INSTRUMENTS) ×4 IMPLANT
MAT ABSORB  FLUID 56X50 GRAY (MISCELLANEOUS) ×4
MAT ABSORB FLUID 56X50 GRAY (MISCELLANEOUS) ×4 IMPLANT
NDL FASCIA INCISION 18GA (NEEDLE) ×4 IMPLANT
PACK BASIN MINOR ARMC (MISCELLANEOUS) ×4 IMPLANT
PAD ABD DERMACEA PRESS 5X9 (GAUZE/BANDAGES/DRESSINGS) ×8 IMPLANT
PAD PREP 24X41 OB/GYN DISP (PERSONAL CARE ITEMS) ×4 IMPLANT
SET IRRIGATING DISP (SET/KITS/TRAYS/PACK) ×4 IMPLANT
SHEET NEURO XL SOL CTL (MISCELLANEOUS) ×4 IMPLANT
SOL .9 NS 3000ML IRR  AL (IV SOLUTION) ×16
SOL .9 NS 3000ML IRR AL (IV SOLUTION) ×16
SOL .9 NS 3000ML IRR UROMATIC (IV SOLUTION) ×8 IMPLANT
SPONGE DRAIN TRACH 4X4 STRL 2S (GAUZE/BANDAGES/DRESSINGS) ×4 IMPLANT
STENT URET 6FRX24 CONTOUR (STENTS) ×2 IMPLANT
STENT URET 6FRX26 CONTOUR (STENTS) IMPLANT
STRAP SAFETY 5IN WIDE (MISCELLANEOUS) ×8 IMPLANT
SUT SILK 0 SH 30 (SUTURE) ×4 IMPLANT
SYR 10ML LL (SYRINGE) ×4 IMPLANT
SYR 20ML LL LF (SYRINGE) ×4 IMPLANT
SYR 30ML LL (SYRINGE) ×4 IMPLANT
SYRINGE IRR TOOMEY STRL 70CC (SYRINGE) ×4 IMPLANT
TAPE CLOTH 3X10 WHT NS LF (GAUZE/BANDAGES/DRESSINGS) ×4 IMPLANT
TAPE MICROFOAM 4IN (TAPE) ×4 IMPLANT
TRAP SPECIMEN MUCOUS 40CC (MISCELLANEOUS) IMPLANT
TRAY FOLEY MTR SLVR 16FR STAT (SET/KITS/TRAYS/PACK) ×4 IMPLANT
TUBING CONNECTING 10 (TUBING) ×3 IMPLANT
TUBING CONNECTING 10' (TUBING) ×1
WATER STERILE IRR 1000ML POUR (IV SOLUTION) ×4 IMPLANT

## 2019-05-28 NOTE — Anesthesia Postprocedure Evaluation (Signed)
Anesthesia Post Note  Patient: [name]  Procedure(s) Performed: NEPHROLITHOTOMY PERCUTANEOUS (Left Back) HOLMIUM LASER APPLICATION (N/A )  Patient location during evaluation: PACU Anesthesia Type: General Level of consciousness: awake and alert Pain management: pain level controlled Vital Signs Assessment: post-procedure vital signs reviewed and stable Respiratory status: spontaneous breathing and respiratory function stable Cardiovascular status: stable Anesthetic complications: no     Last Vitals:  Vitals:   05/28/19 1557 05/28/19 1604  BP:    Pulse: (!) 52 (!) 51  Resp: 14 16  Temp:    SpO2: 96% 95%    Last Pain:  Vitals:   05/28/19 1553  PainSc: 3                  [name] K

## 2019-05-28 NOTE — Consult Note (Signed)
Chief Complaint: Patient was seen in consultation today for left percutaneous nephrostomy tube placement at the request of [place]  Referring Physician(s): [place]  Patient Status: [place] - Out-pt  History of Present Illness: [name] is a 63 y.o. male with history of nephrolithiasis and previous urologic procedures for stone disease.  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  History of intermittent right flank pain.  In addition, the patient has had recent elevation of creatinine.  Patient was evaluated by Dr. Erlene Quan and scheduled for a percutaneous left nephrolithotomy procedure.  Past medical history is significant for cardiac disease with low ejection fraction.  Patient is asymptomatic today.  He denies chest pain, shortness of breath abdominal pain or urinary symptoms.  He denies having COVID-19.  Recent COVID-19 test was negative.  Past Medical History:  Diagnosis Date  . Arthritis    knees, lower back  . Bronchitis   . Cardiomyopathy ([place])   . CHF (congestive heart failure) ([place])   . Chronic back pain   . Complication of anesthesia    has trouble keeping pt asleep due to taking narcotics qid  . Coronary artery disease   . Hepatitis C virus infection without hepatic coma    received treatment. clear now  . History of kidney stones   . Hypertension     Past Surgical History:  Procedure Laterality Date  . BACK SURGERY    . CARDIAC CATHETERIZATION    . CHOLECYSTECTOMY    . COLON RESECTION  2007   due to diverticulitis  . COLONOSCOPY WITH PROPOFOL N/A 03/29/2017   Procedure: COLONOSCOPY WITH PROPOFOL;  Surgeon: Lucilla Lame, MD;  Location: [facility] ENDOSCOPY;  Service: Endoscopy;  Laterality: N/A;  . ESOPHAGOGASTRODUODENOSCOPY (EGD) WITH PROPOFOL N/A 03/29/2017   Procedure: ESOPHAGOGASTRODUODENOSCOPY (EGD) WITH PROPOFOL;  Surgeon: Lucilla Lame, MD;  Location: [facility] ENDOSCOPY;  Service:  Endoscopy;  Laterality: N/A;  . FINGER SURGERY  2007   cut finger off with saw and was reattached  . HERNIA REPAIR    . KNEE ARTHROSCOPY Bilateral   . LOWER EXTREMITY ANGIOGRAPHY Right 11/09/2017   Procedure: LOWER EXTREMITY ANGIOGRAPHY;  Surgeon: Katha Cabal, MD;  Location: [place] CV LAB;  Service: Cardiovascular;  Laterality: Right;  . LOWER EXTREMITY ANGIOGRAPHY Right 11/21/2018   Procedure: LOWER EXTREMITY ANGIOGRAPHY;  Surgeon: Katha Cabal, MD;  Location: [place] CV LAB;  Service: Cardiovascular;  Laterality: Right;  . SPINAL FUSION      Allergies: Codeine and Dilaudid [hydromorphone]  Medications: Prior to Admission medications   Medication Sig Start Date End Date Taking? Authorizing Provider  ALPRAZolam Duanne Moron) 1 MG tablet Take 1 mg by mouth 3 (three) times daily as needed for anxiety.  01/06/16   [provider]  carvedilol (COREG) 25 MG tablet Take 25 mg by mouth 2 (two) times daily.     [provider]  clopidogrel (PLAVIX) 75 MG tablet Take 1 tablet (75 mg total) by mouth daily. 11/09/17   Schnier, Dolores Lory, MD  FLUoxetine (PROZAC) 40 MG capsule Take 40 mg by mouth every morning.  05/11/19   [provider]  fluticasone (FLONASE) 50 MCG/ACT nasal spray Place 1 spray into both nostrils daily as needed for allergies.  01/20/17   [provider]  furosemide (LASIX) 40 MG tablet Take 40 mg by mouth daily as needed (fluid retention.).    [provider]  ipratropium-albuterol (DUONEB) 0.5-2.5 (3) MG/3ML SOLN Take 3 mLs  by nebulization every 4 (four) hours as needed (for SOB). Patient not taking: Reported on 05/28/2019 12/05/17   Vaughan Basta, MD  losartan (COZAAR) 50 MG tablet Take 50 mg by mouth every morning.  02/15/19 05/22/19  [provider]  magnesium hydroxide (MILK OF MAGNESIA) 400 MG/5ML suspension Take 15 mLs by mouth daily as needed for mild constipation or indigestion.    [provider]  meloxicam (MOBIC) 15 MG tablet Take 15 mg by mouth daily as needed (for inflammation.).     [provider]  oxyCODONE (ROXICODONE) 15 MG immediate release tablet Take 15 mg by mouth 4 (four) times daily.  05/07/19   [provider]  rosuvastatin (CRESTOR) 40 MG tablet Take 40 mg by mouth every evening.  09/20/18   [provider]  spironolactone (ALDACTONE) 25 MG tablet Take 25 mg by mouth daily. 05/07/19   [provider]     Family History  Problem Relation Age of Onset  . COPD Mother     Social History   Socioeconomic History  . Marital status: Married    Spouse name: Not on file  . Number of children: Not on file  . Years of education: Not on file  . Highest education level: Not on file  Occupational History  . Not on file  Social Needs  . Financial resource strain: Not on file  . Food insecurity    Worry: Not on file    Inability: Not on file  . Transportation needs    Medical: Not on file    Non-medical: Not on file  Tobacco Use  . Smoking status: Former Smoker    Packs/day: 1.00    Years: 25.00    Pack years: 25.00    Types: Cigarettes    Quit date: 2013    Years since quitting: 7.8  . Smokeless tobacco: Never Used  Substance and Sexual Activity  . Alcohol use: No  . Drug use: No  . Sexual activity: Not on file  Lifestyle  . Physical activity    Days per week: Not on file    Minutes per session: Not on file  . Stress: Not on file  Relationships  . Social Herbalist on phone: Not on file    Gets together: Not on file    Attends religious service: Not on file    Active member of club or organization: Not on file    Attends meetings of clubs or organizations: Not on file    Relationship status: Not on file  Other Topics Concern  . Not on file  Social History Narrative  . Not on file     Review of Systems: A 12 point ROS discussed and pertinent positives are indicated in the HPI above.  All  other systems are negative.  Review of Systems  Constitutional: Negative for chills and fever.  Respiratory: Negative.   Cardiovascular: Negative.   Gastrointestinal: Negative.   Genitourinary: Negative.     Vital Signs: There were no vitals taken for this visit.  Physical Exam Constitutional:      Appearance: He is not ill-appearing.  Cardiovascular:     Rate and Rhythm: Normal rate and regular rhythm.  Pulmonary:     Effort: Pulmonary effort is normal.     Breath sounds: Normal breath sounds.  Abdominal:     General: Abdomen is flat. Bowel sounds are normal.  Neurological:     Mental Status: He is alert.  Imaging: No results found.  Labs:  CBC: Recent Labs    05/22/19 1002  WBC 7.4  HGB 11.4*  HCT 35.8*  PLT 248    COAGS: Recent Labs    05/22/19 1002  INR 1.0    BMP: Recent Labs    11/21/18 0736 05/22/19 1002  NA  --  139  K  --  4.7  CL  --  105  CO2  --  26  GLUCOSE  --  96  BUN 49* 52*  CALCIUM  --  9.1  CREATININE 1.88* 1.83*  GFRNONAA 38* 39*  GFRAA 44* 45*    LIVER FUNCTION TESTS: No results for input(s): BILITOT, AST, ALT, ALKPHOS, PROT, ALBUMIN in the last 8760 hours.  TUMOR MARKERS: No results for input(s): AFPTM, CEA, CA199, CHROMGRNA in the last 8760 hours.  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  Patient is scheduled for left nephrolithotomy procedure by Dr. Erlene Quan.  Patient will need percutaneous access for the surgical procedure.  Patient is familiar with the procedure having gone through it previously.  We discussed the risk of a left nephrostomy tube including bleeding, infection and damage to adjacent structures.  Patient has a good understanding of the risks and benefits and informed consent was obtained.  Plan for left percutaneous nephroureteral catheter placement with ultrasound and fluoroscopic guidance.  Plan to use moderate sedation.  Thank you for this interesting consult.  I greatly  enjoyed meeting [name] and look forward to participating in their care.  A copy of this report was sent to the requesting provider on this date.  Electronically Signed: Burman Riis, MD 05/28/2019, 8:42 AM   I spent a total of  15 Minutes  in face to face in clinical consultation, greater than 50% of which was counseling/coordinating care for left nephroureteral catheter placement.

## 2019-05-28 NOTE — Transfer of Care (Signed)
Immediate Anesthesia Transfer of Care Note  Patient: [name]  Procedure(s) Performed: NEPHROLITHOTOMY PERCUTANEOUS (Left Back) HOLMIUM LASER APPLICATION (N/A )  Patient Location: PACU  Anesthesia Type:General  Level of Consciousness: drowsy and patient cooperative  Airway & Oxygen Therapy: Patient Spontanous Breathing and Patient connected to face mask oxygen  Post-op Assessment: Report given to RN and Post -op Vital signs reviewed and stable  Post vital signs: Reviewed and stable  Last Vitals:  Vitals Value Taken Time  BP 129/62 05/28/19 1437  Temp 35.8 C 05/28/19 1437  Pulse 55 05/28/19 1438  Resp 15 05/28/19 1438  SpO2 100 % 05/28/19 1438  Vitals shown include unvalidated device data.  Last Pain:  Vitals:   05/28/19 1024  PainSc: 0-No pain         Complications: No apparent anesthesia complications

## 2019-05-28 NOTE — Interval H&P Note (Signed)
History and Physical Interval Note:  05/28/2019 10:51 AM  [name]  has presented today for surgery, with the diagnosis of left partial staghorn renal calculus.  The various methods of treatment have been discussed with the patient and family. After consideration of risks, benefits and other options for treatment, the patient has consented to  Procedure(s): NEPHROLITHOTOMY PERCUTANEOUS (Left) HOLMIUM LASER APPLICATION (Left) as a surgical intervention.  The patient's history has been reviewed, patient examined, no change in status, stable for surgery.  I have reviewed the patient's chart and labs.  Questions were answered to the patient's satisfaction.    RRR CTAB  Hollice Espy

## 2019-05-28 NOTE — Procedures (Signed)
Interventional Radiology Procedure:   Indications: Left staghorn calculus and worsening kidney function.  Needs access for OR procedure.   Procedure: Left nephroureteral catheter placement  Findings: Dilated left upper pole.  Staghorn calculus in left lower pole.  Access obtained from left lower pole infundibulum region.  Catheter advanced to bladder.  Unable to place second access through stone-filled lower pole staghorn.   Complications: None     EBL: less than 20 ml  Plan: To OR for stone removal procedure with Dr. Erlene Quan.     Brent Rufo R. Anselm Pancoast, MD  Pager: [phone]

## 2019-05-28 NOTE — Progress Notes (Signed)
Urine drug screen sent to lab now: drawn by Dr. Anselm Pancoast in Vascular Lab post PCNL tube placement.

## 2019-05-28 NOTE — Sedation Documentation (Signed)
Total conscious sedation: Versed 2 mg IV, Fentanyl 67mcg IV. Ancef 2 GM IVPB. Pt. Tolerated procedure well.

## 2019-05-28 NOTE — Anesthesia Preprocedure Evaluation (Signed)
Anesthesia Evaluation  Patient identified by MRN, date of birth, ID band Patient awake    Reviewed: Allergy & Precautions, NPO status , Patient's Chart, lab work & pertinent test results, reviewed documented beta blocker date and time   History of Anesthesia Complications (+) AWARENESS UNDER ANESTHESIA and history of anesthetic complications  Airway Mallampati: II       Dental   Pulmonary neg sleep apnea, COPD,  COPD inhaler, Not current smoker, former smoker,           Cardiovascular hypertension, Pt. on home beta blockers and Pt. on medications + CAD and +CHF (cardiomyopathy LVEF 30%)  (-) dysrhythmias (-) Valvular Problems/Murmurs     Neuro/Psych neg Seizures    GI/Hepatic neg GERD  ,(+) Hepatitis -, C  Endo/Other  neg diabetes  Renal/GU Renal disease (stones)     Musculoskeletal   Abdominal   Peds  Hematology   Anesthesia Other Findings   Reproductive/Obstetrics                             Anesthesia Physical Anesthesia Plan  ASA: III  Anesthesia Plan: General   Post-op Pain Management:    Induction: Intravenous  PONV Risk Score and Plan: 2 and Dexamethasone and Ondansetron  Airway Management Planned: Oral ETT  Additional Equipment:   Intra-op Plan:   Post-operative Plan:   Informed Consent: I have reviewed the patients History and Physical, chart, labs and discussed the procedure including the risks, benefits and alternatives for the proposed anesthesia with the patient or authorized representative who has indicated his/her understanding and acceptance.       Plan Discussed with:   Anesthesia Plan Comments:         Anesthesia Quick Evaluation

## 2019-05-28 NOTE — Anesthesia Procedure Notes (Signed)
Procedure Name: Intubation Date/Time: 05/28/2019 11:07 AM Performed by: Debe Coder, CRNA Pre-anesthesia Checklist: Patient identified, Emergency Drugs available, Suction available and Patient being monitored Patient Re-evaluated:Patient Re-evaluated prior to induction Oxygen Delivery Method: Circle system utilized Preoxygenation: Pre-oxygenation with 100% oxygen Induction Type: IV induction Ventilation: Mask ventilation without difficulty Laryngoscope Size: Mac and 4 Grade View: Grade I Tube type: Oral Tube size: 7.5 mm Number of attempts: 1 Airway Equipment and Method: Stylet and Oral airway Placement Confirmation: ETT inserted through vocal cords under direct vision,  positive ETCO2 and breath sounds checked- equal and bilateral Secured at: 24 cm Tube secured with: Tape Dental Injury: Teeth and Oropharynx as per pre-operative assessment

## 2019-05-28 NOTE — Progress Notes (Signed)
Patient states that foley catheter is causing pain and is leaking. MD called awaiting new orders to see if foley can be removed

## 2019-05-28 NOTE — Anesthesia Post-op Follow-up Note (Signed)
Anesthesia QCDR form completed.        

## 2019-05-28 NOTE — Progress Notes (Signed)
Spoke with Dr. Erlene Quan regarding red/dark pink urine output. VSS, pt alert and oriented with no complaints of pain. Per MD, urine color acceptable. Will transfer pt to room 230

## 2019-05-28 NOTE — Op Note (Addendum)
Date of procedure: 05/28/19  Preoperative diagnosis:  1. Left hydronephrosis 2. Cystinuria 3. Left hydronephrosis  Postoperative diagnosis:  1. Same as above  Procedure: 1. Left PCNL, greater than 2 cm 2. Left antegrade nephrostogram 3. Laser lithotripsy 4. Left antegrade stent placement 5. Nephrostomy tube placement  Surgeon: Hollice Espy, MD  Anesthesia: General  Complications: None  Intraoperative findings: Left partial staghorn calculus with obstruction of the UPJ, upper pole dilation  EBL: 200 cc  Specimens: Stone fragments  Drains: 16 French Foley catheter, 6 x 26 French double-J ureteral stent on left, 18 [place] council tip catheter is in nephrostomy tube  Indication: [name] is a 63 y.o. patient with history of bilateral partial staghorn cystine stones including obstruction of the left UPJ with hydronephrosis of the upper pole after reviewing the management options for treatment, he elected to proceed with the above surgical procedure(s). We have discussed the potential benefits and risks of the procedure, side effects of the proposed treatment, the likelihood of the patient achieving the goals of the procedure, and any potential problems that might occur during the procedure or recuperation. Informed consent has been obtained.  Description of procedure:  The patient was taken to the operating room and general anesthesia was induced.  A Foley catheter was placed.  He was then transitioned in the prone position on the adjacent operating room table after being intubated on the stretcher and all pressure was are carefully padded.  He was then prepped and draped in the usual sterile fashion, and preoperative antibiotics were administered. A preoperative time-out was performed.   Initial scout imaging via fluoroscopy revealed a nephroureteral stent in a mid lower pole calyx traversing the UPJ down to the level of the distal ureter.  Stone could be seen within the  kidney occupying the majority of the mid and lower pole calyces, renal pelvis extending into the UPJ as well as a small portion and upper pole calyx.  This measured greater than 3 cm in longest diameter.  First, the nephroureteral catheter was cannulated using a Super Stiff wire down to the level of the bladder.  This was confirmed fluoroscopically.  The nephroureteral catheter was then removed.  A scalpel was then used to elongate the incision in his flank to approximately 2 cm to accommodate the sheath.  Again under fluoroscopic guidance, an access sheath was used to introduce a second sensor wire to the level of the kidney which went easily.  This wire was snapped in place as a safety wire that was used throughout the procedure.  A faster incisor needle was advanced over the Super Stiff wire to create a cruciate incision made in the patient's fascia.  A NephroMax balloon was then advanced to the level of the stone under fluoroscopic guidance and inflated to 18 cm of water left in place for several minutes to dilate the tract.  The sheath was then advanced over the balloon to the level of the stone.  The balloon was then deflated and removed over the wire which remained in place.  There is no severe significant bleeding at this point.  The nephroscope was then brought in which is able to advanced directly into the collecting system to the level of the stone which was seen immediately.  The trilogy lithotripter device was then brought in and fragmentation/aspiration of the stone was initiated.  I was able to clear out the mid and lower pole calyces quite easily using this device and able to clear out  some of the stone burden within the renal pelvis with this device although the infundibulum into this area was relatively narrow thus all stone could not be cleared using this method.  Next, the nephroscope was exchanged for a flexible cystoscope which was able to be advanced more easily into the renal pelvis where I  used a 365 m laser fiber in the settings of initially 0.2 and 40 and later transition to 1.2 and 10 to fragment the stone within the renal pelvis extending down the ureter.  I used several types of baskets including a 0 tip bliss nitinol basket to remove pieces of the stone burden.  Notably, the stone was extremely hard and laser lithotripsy was difficult although possible.  Was ultimately able to clear out the remainder of the renal pelvis and advance the scope down the portion of the proximal ureter where few additional stone fragments were basketed and removed.  Was unable to make my way into an upper pole calyx which was quite dilated and capacious.  A number of stones were then removed from this upper pole calyx using a basket.  I also was able to bring larger stone to the level of the infundibulum but not pull it through.  Using a combination of the lithoclast device as well as laser and flexible cystoscope, I was ultimately able to clear approximately 95% or so the patient's overall stone burden.  There was 1 stone which was lodged in the mid upper pole infundibulum which was I was not successfully able to fragment and removed.  At this point, that mucosa in the renal pelvis was becoming quite friable and thin and thus I made the decision to leave this small stone and return for staged procedure.  Under fluoroscopic guidance, a 6 x 26 French double-J ureteral stent was advanced over the working wire through the sheath down to the level of the bladder were across the midline.  The wire was then partially withdrawn and a curl was created within the bladder.  The wire was then fully removed and a curl was created within the renal pelvis.  This was also directly visualized using the nephroscope and deemed to be in satisfactory position.  Finally, a council tip Foley catheter was placed through the nephrostomy tube to level the renal pelvis.  The balloon was inflated with 1.5 cc of partially diluted contrast and  appeared to be in satisfactory position.  I then performed an antegrade nephrostogram through the Foley which showed contrast material traversing the UPJ down the ureter.  The renal pelvis filled without extravasation.  There was some extravasation peripherally through the nephrostomy tube tract otherwise the collecting system appeared to be intact.  Finally, the sheath was removed leaving the nephrostomy tube in place.  This was secured to the patient's flank using silk ties x2.  He was then cleaned and dried and a dressing of ABD pads 4 x 4's and foam tape was applied.  He was then repositioned in the supine position on the adjacent structure, reversed from anesthesia ,extubated and taken to the PACU in stable condition.    Plan: Patient will be admitted overnight for observation.  His tubes will be left open to gravity overnight with plans to His nephrostomy tube in the a.m. and hopefully send him home after his nephrostomy tube and Foley are removed.  He will return for staged ureteroscopic procedure to treat his residual stone burden.   Hollice Espy, M.D.

## 2019-05-29 ENCOUNTER — Encounter: Payer: Self-pay | Admitting: Urology

## 2019-05-29 DIAGNOSIS — N132 Hydronephrosis with renal and ureteral calculous obstruction: Secondary | ICD-10-CM | POA: Diagnosis not present

## 2019-05-29 DIAGNOSIS — N200 Calculus of kidney: Secondary | ICD-10-CM

## 2019-05-29 LAB — CBC
HCT: 32.8 % — ABNORMAL LOW (ref 39.0–52.0)
Hemoglobin: 11.1 g/dL — ABNORMAL LOW (ref 13.0–17.0)
MCH: 26.7 pg (ref 26.0–34.0)
MCHC: 33.8 g/dL (ref 30.0–36.0)
MCV: 78.8 fL — ABNORMAL LOW (ref 80.0–100.0)
Platelets: 229 10*3/uL (ref 150–400)
RBC: 4.16 MIL/uL — ABNORMAL LOW (ref 4.22–5.81)
RDW: 15.6 % — ABNORMAL HIGH (ref 11.5–15.5)
WBC: 13.5 10*3/uL — ABNORMAL HIGH (ref 4.0–10.5)
nRBC: 0 % (ref 0.0–0.2)

## 2019-05-29 LAB — BASIC METABOLIC PANEL
Anion gap: 7 (ref 5–15)
BUN: 39 mg/dL — ABNORMAL HIGH (ref 8–23)
CO2: 22 mmol/L (ref 22–32)
Calcium: 8.2 mg/dL — ABNORMAL LOW (ref 8.9–10.3)
Chloride: 105 mmol/L (ref 98–111)
Creatinine, Ser: 1.81 mg/dL — ABNORMAL HIGH (ref 0.61–1.24)
GFR calc Af Amer: 46 mL/min — ABNORMAL LOW (ref >60)
GFR calc non Af Amer: 39 mL/min — ABNORMAL LOW (ref >60)
Glucose, Bld: 188 mg/dL — ABNORMAL HIGH (ref 70–99)
Potassium: 4.9 mmol/L (ref 3.5–5.1)
Sodium: 134 mmol/L — ABNORMAL LOW (ref 135–145)

## 2019-05-29 MED ORDER — OXYBUTYNIN CHLORIDE 5 MG PO TABS: 5.0000 mg | ORAL_TABLET | Freq: Three times a day (TID) | ORAL | 2 refills | Status: DC | PRN

## 2019-05-29 MED ORDER — CHLORHEXIDINE GLUCONATE CLOTH 2 % EX PADS
6.0000 | MEDICATED_PAD | Freq: Every day | CUTANEOUS | Status: DC
  Administered 2019-05-29: 12:00:00 6 via TOPICAL

## 2019-05-29 MED ORDER — DOCUSATE SODIUM 100 MG PO CAPS: 100.0000 mg | ORAL_CAPSULE | Freq: Two times a day (BID) | ORAL | 0 refills | Status: DC

## 2019-05-29 MED ORDER — TAMSULOSIN HCL 0.4 MG PO CAPS: 0.4000 mg | ORAL_CAPSULE | Freq: Every day | ORAL | 0 refills | Status: DC

## 2019-05-29 NOTE — Progress Notes (Signed)
[name] to be D/C'd Home per MD order.  Discussed prescriptions and follow up appointments with the patient. Prescriptions given to patient, medication list explained in detail. Pt verbalized understanding.  Allergies as of 05/29/2019      Reactions   Codeine Itching   Dilaudid [hydromorphone] Other (See Comments)   Severe headache      Medication List    TAKE these medications   ALPRAZolam 1 MG tablet Commonly known as: XANAX Take 1 mg by mouth 3 (three) times daily as needed for anxiety.   carvedilol 25 MG tablet Commonly known as: COREG Take 25 mg by mouth 2 (two) times daily.   clopidogrel 75 MG tablet Commonly known as: Plavix Take 1 tablet (75 mg total) by mouth daily.   docusate sodium 100 MG capsule Commonly known as: COLACE Take 1 capsule (100 mg total) by mouth 2 (two) times daily.   FLUoxetine 40 MG capsule Commonly known as: PROZAC Take 40 mg by mouth every morning.   fluticasone 50 MCG/ACT nasal spray Commonly known as: FLONASE Place 1 spray into both nostrils daily as needed for allergies.   furosemide 40 MG tablet Commonly known as: LASIX Take 40 mg by mouth daily as needed (fluid retention.).   ipratropium-albuterol 0.5-2.5 (3) MG/3ML Soln Commonly known as: DUONEB Take 3 mLs by nebulization every 4 (four) hours as needed (for SOB).   losartan 50 MG tablet Commonly known as: COZAAR Take 50 mg by mouth every morning.   magnesium hydroxide 400 MG/5ML suspension Commonly known as: MILK OF MAGNESIA Take 15 mLs by mouth daily as needed for mild constipation or indigestion.   meloxicam 15 MG tablet Commonly known as: MOBIC Take 15 mg by mouth daily as needed (for inflammation.).   oxybutynin 5 MG tablet Commonly known as: DITROPAN Take 1 tablet (5 mg total) by mouth every 8 (eight) hours as needed for bladder spasms.   oxyCODONE 15 MG immediate release tablet Commonly known as: ROXICODONE Take 15 mg by mouth 4 (four) times daily.    rosuvastatin 40 MG tablet Commonly known as: CRESTOR Take 40 mg by mouth every evening.   spironolactone 25 MG tablet Commonly known as: ALDACTONE Take 25 mg by mouth daily.   tamsulosin 0.4 MG Caps capsule Commonly known as: FLOMAX Take 1 capsule (0.4 mg total) by mouth daily.       Vitals:   05/29/19 0940 05/29/19 1207  BP: (!) 126/57 124/62  Pulse: (!) 55 63  Resp: 18 18  Temp: 98 F (36.7 C) 97.9 F (36.6 C)  SpO2: 100% 97%    Skin clean, dry and intact without evidence of skin break down, no evidence of skin tears noted. IV catheter discontinued intact. Site without signs and symptoms of complications. Dressing and pressure applied. Pt denies pain at this time. No complaints noted.  An After Visit Summary was printed and given to the patient. Patient escorted via [place], and D/C home via private auto.  Fuller Mandril, RN

## 2019-05-29 NOTE — Care Management Obs Status (Signed)
[place] NOTIFICATION   Patient Details  Name: [name] MRN: [id number] Date of Birth: May 29, 1956   Medicare Observation Status Notification Given:  Yes    Beverly Sessions, RN 05/29/2019, 3:23 PM

## 2019-05-29 NOTE — Progress Notes (Signed)
Urology Inpatient Progress Note  Subjective: [name] is a 63 y.o. male admitted on 05/28/2019 for scheduled PCNL with Dr. Erlene Quan for a left partial staghorn calculus with obstruction of the UPJ with upper pole dilation.  Creatinine stable at 1.81 today.  WBC count down today, 13.5.  Hemoglobin 11.1 today, down from 12.1 yesterday.  He reports a difficult night overnight, catheter discomfort and the urge to urinate.  Catheter did clog at one point requiring irrigation.  It is in place draining clear, light pink urine without clot material this morning.  Left nephrostomy tube in place, plugged.  Anti-infectives: Anti-infectives (From admission, onward)   Start     Dose/Rate Route Frequency Ordered Stop   05/28/19 1700  ceFAZolin (ANCEF) IVPB 1 g/50 mL premix     1 g 100 mL/hr over 30 Minutes Intravenous Every 8 hours 05/28/19 1647 05/29/19 0700   05/28/19 1103  ciprofloxacin (CIPRO) 400 MG/200ML IVPB    Note to Pharmacy: Dorene Sorrow   : cabinet override      05/28/19 1103 05/28/19 1124   05/28/19 1003  ceFAZolin (ANCEF) 2-4 GM/100ML-% IVPB  Status:  Discontinued    Note to Pharmacy: Calton Dach   : cabinet override      05/28/19 1003 05/28/19 1033   05/28/19 0846  ceFAZolin (ANCEF) IVPB 2g/100 mL premix  Status:  Discontinued     2 g 200 mL/hr over 30 Minutes Intravenous 30 min pre-op 05/28/19 0846 05/28/19 1619   05/28/19 0815  ceFAZolin (ANCEF) 2-4 GM/100ML-% IVPB    Note to Pharmacy: Fransico Michael   : cabinet override      05/28/19 0815 05/28/19 2029   05/28/19 0000  ciprofloxacin (CIPRO) IVPB 400 mg     400 mg 200 mL/hr over 60 Minutes Intravenous  Once 05/27/19 2146 05/28/19 1124      Current Facility-Administered Medications  Medication Dose Route Frequency Provider Last Rate Last Dose  . 0.9 %  sodium chloride infusion   Intravenous Continuous Hollice Espy, MD 125 mL/hr at 05/29/19 1032    . acetaminophen (TYLENOL) tablet 650 mg  650 mg Oral Q4H PRN Hollice Espy, MD      . ALPRAZolam Duanne Moron) tablet 1 mg  1 mg Oral TID PRN Hollice Espy, MD   1 mg at 05/28/19 2109  . carvedilol (COREG) tablet 25 mg  25 mg Oral BID Hollice Espy, MD   25 mg at 05/28/19 2109  . Chlorhexidine Gluconate Cloth 2 % PADS 6 each  6 each Topical Daily Hollice Espy, MD   6 each at 05/29/19 1206  . diphenhydrAMINE (BENADRYL) injection 12.5 mg  12.5 mg Intravenous Q6H PRN Hollice Espy, MD       Or  . diphenhydrAMINE (BENADRYL) 12.5 MG/5ML elixir 12.5 mg  12.5 mg Oral Q6H PRN Hollice Espy, MD      . docusate sodium (COLACE) capsule 100 mg  100 mg Oral BID Hollice Espy, MD   100 mg at 05/29/19 1022  . FLUoxetine (PROZAC) capsule 40 mg  40 mg Oral Maryclare Labrador, MD   40 mg at 05/29/19 [id number]  . fluticasone (FLONASE) 50 MCG/ACT nasal spray 1 spray  1 spray Each Nare Daily PRN Hollice Espy, MD      . heparin injection 5,000 Units  5,000 Units Subcutaneous Q8H Hollice Espy, MD   5,000 Units at 05/29/19 [phone]  . ipratropium-albuterol (DUONEB) 0.5-2.5 (3) MG/3ML nebulizer solution 3 mL  3 mL Nebulization Q4H PRN Hollice Espy, MD      .  losartan (COZAAR) tablet 50 mg  50 mg Oral Maryclare Labrador, MD   50 mg at 05/29/19 [id number]  . magnesium hydroxide (MILK OF MAGNESIA) suspension 15 mL  15 mL Oral Daily PRN Hollice Espy, MD      . morphine 2 MG/ML injection 2-4 mg  2-4 mg Intravenous Q2H PRN Hollice Espy, MD   4 mg at 05/29/19 1021  . ondansetron (ZOFRAN) injection 4 mg  4 mg Intravenous Q4H PRN Hollice Espy, MD      . opium-belladonna (B&O SUPPRETTES) 16.2-60 MG suppository 1 suppository  1 suppository Rectal Q6H PRN Hollice Espy, MD   1 suppository at 05/28/19 2109  . oxybutynin (DITROPAN) tablet 5 mg  5 mg Oral Q8H PRN Hollice Espy, MD   5 mg at 05/29/19 1021  . oxyCODONE-acetaminophen (PERCOCET/ROXICET) 5-325 MG per tablet 1-2 tablet  1-2 tablet Oral Q4H PRN Hollice Espy, MD   1 tablet at 05/29/19 1225  . rosuvastatin (CRESTOR)  tablet 40 mg  40 mg Oral QPM Hollice Espy, MD   40 mg at 05/28/19 1712  . spironolactone (ALDACTONE) tablet 25 mg  25 mg Oral Daily Hollice Espy, MD         Objective: Vital signs in last 24 hours: Temp:  [96.5 F (35.8 C)-98 F (36.7 C)] 97.9 F (36.6 C) (11/17 1207) Pulse Rate:  [48-63] 63 (11/17 1207) Resp:  [12-20] 18 (11/17 1207) BP: (119-148)/(52-74) 124/62 (11/17 1207) SpO2:  [95 %-100 %] 97 % (11/17 1207) Weight:  [69.6 kg] 69.6 kg (11/16 1700)  Intake/Output from previous day: 11/16 0701 - 11/17 0700 In: 725 [P.O.:25; I.V.:700] Out: 2005 [Urine:1980; Blood:25] Intake/Output this shift: Total I/O In: 240 [P.O.:240] Out: 0   Physical Exam Vitals signs and nursing note reviewed.  Constitutional:      General: He is not in acute distress.    Appearance: He is not ill-appearing, toxic-appearing or diaphoretic.  HENT:     Head: Normocephalic and atraumatic.  Pulmonary:     Effort: Pulmonary effort is normal. No respiratory distress.  Abdominal:     General: There is no distension.     Palpations: Abdomen is soft. There is no mass.     Tenderness: There is no abdominal tenderness. There is no guarding or rebound.  Genitourinary:    Comments: See HPI.  Left nephrostomy tube in place with intact dressing overlying. Skin:    General: Skin is warm and dry.  Neurological:     Mental Status: He is alert and oriented to person, place, and time.  Psychiatric:        Mood and Affect: Mood normal.        Behavior: Behavior normal.    Lab Results:  Recent Labs    05/28/19 1640 05/29/19 0515  WBC 15.3* 13.5*  HGB 12.1* 11.1*  HCT 38.3* 32.8*  PLT 235 229   BMET Recent Labs    05/29/19 0515  NA 134*  K 4.9  CL 105  CO2 22  GLUCOSE 188*  BUN 39*  CREATININE 1.81*  CALCIUM 8.2*   PT/INR Recent Labs    05/28/19 1640  LABPROT 12.9  INR 1.0   Assessment & Plan: 63 year old male recovering well after planned left PCNL with Dr. Erlene Quan yesterday.   Dr. Erlene Quan was able to clear 95% of his cystine stone with plans for follow-up for staged procedure in the future.  Creatinine stable, white count improved, and minimal blood loss following surgery.  Foley catheter remains in place  with mild occlusion overnight, now resolved following irrigation.  No other issues.  Reconnected nephrostomy tube to drainage this morning to ensure adequate output.  Clamped again midmorning.  This afternoon, Dr. Erlene Quan and I discontinued patient's Foley catheter without incident.  Procedure note below.  We subsequently removed patient's nephrostomy tube with minimal site leakage and no frank blood.  Nephrostomy site rebandaged with a Tegaderm overlying it.  Advised patient that the site would continue to leak over the coming week.  Patient continues to complain of the urge to urinate.  It was explained several times that this is a normal sensation associated with a urinary stent.  Will discharge on oxybutynin and Flomax for management of this.  Of note, patient reports having a pain contract with another provider.  Will defer to them to prescribe narcotics following surgery, however we suspect that he will not need much of these.  Catheter Removal Patient is present today for a catheter removal.  99ml of water was drained from the balloon. A 16FR foley cath was removed from the bladder no complications were noted . Patient tolerated well. Performed by: Hollice Espy, MD  Debroah Loop, PA-C 05/29/2019

## 2019-05-29 NOTE — Progress Notes (Signed)
Patient concern with foley and stent placement.  Stating that its not in the correct position. Patient requesting MD to come to room due to little output and severe pain. Will contact attending

## 2019-05-29 NOTE — Discharge Summary (Signed)
Date of admission: 05/28/2019  Date of discharge: 05/29/2019  Admission diagnosis: Left hydronephrosis, cystinuria, left partial staghorn calculus with UVJ obstruction and upper pole dilation  Discharge diagnosis: Same as above  Secondary diagnoses:  Patient Active Problem List   Diagnosis Date Noted  . Staghorn calculus 05/28/2019  . Elevated rheumatoid factor 12/12/2018  . Pain syndrome, chronic 02/24/2018  . Acute respiratory failure with hypoxia ([place]) 12/04/2017  . COPD (chronic obstructive pulmonary disease) ([place]) 12/04/2017  . Acute bronchitis 12/04/2017  . Atherosclerosis of native arteries of extremity with intermittent claudication ([place]) 11/10/2017  . Leg pain 11/10/2017  . Hyperlipidemia 11/10/2017  . DJD (degenerative joint disease) 11/10/2017  . Essential hypertension 11/10/2017  . Lumbar spondylosis 08/17/2017  . Loss of weight   . Polyp of sigmoid colon   . Gastritis without bleeding   . Kidney stone 04/16/2014  . Gross hematuria 04/16/2013  . Nephrolithiasis 04/16/2013    History and Physical: For full details, please see admission history and physical. Briefly, [name] is a 63 y.o. year old patient admitted on 05/28/2019 for planned left PCNL with left antegrade nephrostogram, laser lithotripsy, left antegrade stent placement, and left nephrostomy tube placement.  Dr. Erlene Quan was able to clear approximately 95% of the patient's stone burden in the OR, with plans to return to clear the residual stone burden in a follow-up, staged procedure.  Patient's left ureteral stent to remain in place until that time.  Hospital Course: Patient tolerated the procedure well.  He was then transferred to the floor after an uneventful PACU stay.  His hospital course was uncomplicated.  On POD#1 he had met discharge criteria: was eating a regular diet, was up and ambulating independently,  pain was well controlled, was voiding without a catheter, nephrostomy tube was  discontinued, and was ready for discharge.  Laboratory values:  Recent Labs    05/28/19 1640 05/29/19 0515  WBC 15.3* 13.5*  HGB 12.1* 11.1*  HCT 38.3* 32.8*   Recent Labs    05/29/19 0515  NA 134*  K 4.9  CL 105  CO2 22  GLUCOSE 188*  BUN 39*  CREATININE 1.81*  CALCIUM 8.2*   Recent Labs    05/28/19 1640  INR 1.0   Results for orders placed or performed during the hospital encounter of 05/24/19  SARS CORONAVIRUS 2 (TAT 6-24 HRS) Nasopharyngeal Nasopharyngeal Swab     Status: None   Collection Time: 05/24/19  9:31 AM   Specimen: Nasopharyngeal Swab  Result Value Ref Range Status   SARS Coronavirus 2 NEGATIVE NEGATIVE Final    Comment: (NOTE) SARS-CoV-2 target nucleic acids are NOT DETECTED. The SARS-CoV-2 RNA is generally detectable in upper and lower respiratory specimens during the acute phase of infection. Negative results do not preclude SARS-CoV-2 infection, do not rule out co-infections with other pathogens, and should not be used as the sole basis for treatment or other patient management decisions. Negative results must be combined with clinical observations, patient history, and epidemiological information. The expected result is Negative. Fact Sheet for Patients: SugarRoll.be Fact Sheet for Healthcare Providers: https://www.woods-mathews.com/ This test is not yet approved or cleared by the [place] FDA and  has been authorized for detection and/or diagnosis of SARS-CoV-2 by FDA under an Emergency Use Authorization (EUA). This EUA will remain  in effect (meaning this test can be used) for the duration of the COVID-19 declaration under Section 56 4(b)(1) of the Act, 21 U.S.C. section 360bbb-3(b)(1), unless the authorization is terminated or  revoked sooner. Performed at [facility], [place] [address], [place], [place] [address]    Disposition: Home  Discharge instruction: The patient was instructed  to be ambulatory but told to refrain from heavy lifting, strenuous activity, or driving.  Discharge medications:  Allergies as of 05/29/2019      Reactions   Codeine Itching   Dilaudid [hydromorphone] Other (See Comments)   Severe headache      Medication List    TAKE these medications   ALPRAZolam 1 MG tablet Commonly known as: XANAX Take 1 mg by mouth 3 (three) times daily as needed for anxiety.   carvedilol 25 MG tablet Commonly known as: COREG Take 25 mg by mouth 2 (two) times daily.   clopidogrel 75 MG tablet Commonly known as: Plavix Take 1 tablet (75 mg total) by mouth daily.   docusate sodium 100 MG capsule Commonly known as: COLACE Take 1 capsule (100 mg total) by mouth 2 (two) times daily.   FLUoxetine 40 MG capsule Commonly known as: PROZAC Take 40 mg by mouth every morning.   fluticasone 50 MCG/ACT nasal spray Commonly known as: FLONASE Place 1 spray into both nostrils daily as needed for allergies.   furosemide 40 MG tablet Commonly known as: LASIX Take 40 mg by mouth daily as needed (fluid retention.).   ipratropium-albuterol 0.5-2.5 (3) MG/3ML Soln Commonly known as: DUONEB Take 3 mLs by nebulization every 4 (four) hours as needed (for SOB).   losartan 50 MG tablet Commonly known as: COZAAR Take 50 mg by mouth every morning.   magnesium hydroxide 400 MG/5ML suspension Commonly known as: MILK OF MAGNESIA Take 15 mLs by mouth daily as needed for mild constipation or indigestion.   meloxicam 15 MG tablet Commonly known as: MOBIC Take 15 mg by mouth daily as needed (for inflammation.).   oxybutynin 5 MG tablet Commonly known as: DITROPAN Take 1 tablet (5 mg total) by mouth every 8 (eight) hours as needed for bladder spasms.   oxyCODONE 15 MG immediate release tablet Commonly known as: ROXICODONE Take 15 mg by mouth 4 (four) times daily.   rosuvastatin 40 MG tablet Commonly known as: CRESTOR Take 40 mg by mouth every evening.    spironolactone 25 MG tablet Commonly known as: ALDACTONE Take 25 mg by mouth daily.   tamsulosin 0.4 MG Caps capsule Commonly known as: FLOMAX Take 1 capsule (0.4 mg total) by mouth daily.       Followup: 2 weeks to discuss staged URS (scheduling in progress)

## 2019-05-29 NOTE — Progress Notes (Signed)
Foley irrigated per MD. 500 pinkish red output of urine. Morphine given. NAD. Patient resting. No coude needed

## 2019-06-06 LAB — CALCULI, WITH PHOTOGRAPH (CLINICAL LAB)
Cystine Calculi: 100 %
Weight Calculi: 2105 mg

## 2019-06-13 ENCOUNTER — Ambulatory Visit (INDEPENDENT_AMBULATORY_CARE_PROVIDER_SITE_OTHER): Payer: Medicare Other | Admitting: Physician Assistant

## 2019-06-13 ENCOUNTER — Other Ambulatory Visit: Payer: Self-pay | Admitting: Radiology

## 2019-06-13 ENCOUNTER — Other Ambulatory Visit: Payer: Self-pay

## 2019-06-13 ENCOUNTER — Encounter: Payer: Self-pay | Admitting: Physician Assistant

## 2019-06-13 VITALS — BP 112/66 | HR 52 | Ht 67.0 in | Wt 153.0 lb

## 2019-06-13 DIAGNOSIS — N200 Calculus of kidney: Secondary | ICD-10-CM

## 2019-06-13 NOTE — Progress Notes (Signed)
06/13/2019 10:39 AM   [name] 1956/06/18 [id number]  CC: Left partial staghorn calculus, s/p staged PCNL   HPI: [name] is a 63 y.o. male who presents for management of known left partial staghorn calculus, s/p staged PCNL with Dr. Erlene Quan on 05/28/2019.  Procedure was uncomplicated with removal of approximately 95% of his left-sided stone burden.  His left nephrostomy tube and Foley catheter were discontinued on POD 1 without incident.  A left ureteral stent remains in place. Stone analysis from this procedure ultimately resulted with 100% cystine composition, consistent with prior stone analyses.  In the interim, patient reports feeling well.  He denies flank pain, dysuria, and gross hematuria.  He has no acute concerns today.  PMH: Past Medical History:  Diagnosis Date  . Arthritis    knees, lower back  . Bronchitis   . Cardiomyopathy ([place])   . CHF (congestive heart failure) ([place])   . Chronic back pain   . Complication of anesthesia    has trouble keeping pt asleep due to taking narcotics qid  . Coronary artery disease   . Hepatitis C virus infection without hepatic coma    received treatment. clear now  . History of kidney stones   . Hypertension     Surgical History: Past Surgical History:  Procedure Laterality Date  . BACK SURGERY    . CARDIAC CATHETERIZATION    . CHOLECYSTECTOMY    . COLON RESECTION  2007   due to diverticulitis  . COLONOSCOPY WITH PROPOFOL N/A 03/29/2017   Procedure: COLONOSCOPY WITH PROPOFOL;  Surgeon: Lucilla Lame, MD;  [facility] ENDOSCOPY;  Service: Endoscopy;  Laterality: N/A;  . ESOPHAGOGASTRODUODENOSCOPY (EGD) WITH PROPOFOL N/A 03/29/2017   Procedure: ESOPHAGOGASTRODUODENOSCOPY (EGD) WITH PROPOFOL;  Surgeon: Lucilla Lame, MD;  Location: [facility] ENDOSCOPY;  Service: Endoscopy;  Laterality: N/A;  . FINGER SURGERY  2007   cut finger off with saw and was reattached  . HERNIA REPAIR    . HOLMIUM LASER APPLICATION N/A [id number]    Procedure: HOLMIUM LASER APPLICATION;  Surgeon: Hollice Espy, MD;  Location: [facility] ORS;  Service: Urology;  Laterality: N/A;  . IR NEPHROSTOMY PLACEMENT LEFT  05/28/2019  . KNEE ARTHROSCOPY Bilateral   . LOWER EXTREMITY ANGIOGRAPHY Right 11/09/2017   Procedure: LOWER EXTREMITY ANGIOGRAPHY;  Surgeon: Katha Cabal, MD;  Location: [place] CV LAB;  Service: Cardiovascular;  Laterality: Right;  . LOWER EXTREMITY ANGIOGRAPHY Right 11/21/2018   Procedure: LOWER EXTREMITY ANGIOGRAPHY;  Surgeon: Katha Cabal, MD;  Location: [place] [facility] LAB;  Service: Cardiovascular;  Laterality: Right;  . NEPHROLITHOTOMY Left 05/28/2019   Procedure: NEPHROLITHOTOMY PERCUTANEOUS;  Surgeon: Hollice Espy, MD;  Location: [facility] ORS;  Service: Urology;  Laterality: Left;  . SPINAL FUSION      Home Medications:  Allergies as of 06/13/2019      Reactions   Codeine Itching   Dilaudid [hydromorphone] Other (See Comments)   Severe headache      Medication List       Accurate as of June 13, 2019 10:39 AM. If you have any questions, ask your nurse or doctor.        STOP taking these medications   oxybutynin 5 MG tablet Commonly known as: DITROPAN Stopped by: Debroah Loop, PA-C     TAKE these medications   ALPRAZolam 1 MG tablet Commonly known as: XANAX Take 1 mg by mouth 3 (three) times daily as needed for anxiety.   carvedilol 25 MG tablet Commonly known as:  COREG Take 25 mg by mouth 2 (two) times daily.   clopidogrel 75 MG tablet Commonly known as: Plavix Take 1 tablet (75 mg total) by mouth daily.   docusate sodium 100 MG capsule Commonly known as: COLACE Take 1 capsule (100 mg total) by mouth 2 (two) times daily.   FLUoxetine 40 MG capsule Commonly known as: PROZAC Take 40 mg by mouth every morning.   fluticasone 50 MCG/ACT nasal spray Commonly known as: FLONASE Place 1 spray into both nostrils daily as needed for allergies.   furosemide 40 MG tablet  Commonly known as: LASIX Take 40 mg by mouth daily as needed (fluid retention.).   ipratropium-albuterol 0.5-2.5 (3) MG/3ML Soln Commonly known as: DUONEB Take 3 mLs by nebulization every 4 (four) hours as needed (for SOB).   losartan 50 MG tablet Commonly known as: COZAAR Take 50 mg by mouth every morning.   magnesium hydroxide 400 MG/5ML suspension Commonly known as: MILK OF MAGNESIA Take 15 mLs by mouth daily as needed for mild constipation or indigestion.   meloxicam 15 MG tablet Commonly known as: MOBIC Take 15 mg by mouth daily as needed (for inflammation.).   oxyCODONE 15 MG immediate release tablet Commonly known as: ROXICODONE Take 15 mg by mouth 4 (four) times daily.   rosuvastatin 40 MG tablet Commonly known as: CRESTOR Take 40 mg by mouth every evening.   spironolactone 25 MG tablet Commonly known as: ALDACTONE Take 25 mg by mouth daily.   tamsulosin 0.4 MG Caps capsule Commonly known as: FLOMAX Take 1 capsule (0.4 mg total) by mouth daily.       Allergies:  Allergies  Allergen Reactions  . Codeine Itching  . Dilaudid [Hydromorphone] Other (See Comments)    Severe headache    Family History: Family History  Problem Relation Age of Onset  . COPD Mother     Social History:  reports that he quit smoking about 7 years ago. His smoking use included cigarettes. He has a 25.00 pack-year smoking history. He has never used smokeless tobacco. He reports that he does not drink alcohol or use drugs.  ROS: UROLOGY Frequent Urination?: Yes Hard to postpone urination?: No Burning/pain with urination?: No Get up at night to urinate?: Yes Leakage of urine?: No Urine stream starts and stops?: No Trouble starting stream?: No Do you have to strain to urinate?: No Blood in urine?: No Urinary tract infection?: No Sexually transmitted disease?: No Injury to kidneys or bladder?: No Painful intercourse?: No Weak stream?: No Erection problems?: No Penile  pain?: No  Gastrointestinal Nausea?: No Vomiting?: No Indigestion/heartburn?: No Diarrhea?: No Constipation?: No  Constitutional Fever: No Night sweats?: No Weight loss?: No Fatigue?: No  Skin Skin rash/lesions?: No Itching?: No  Eyes Blurred vision?: No Double vision?: No  Ears/Nose/Throat Sore throat?: No Sinus problems?: No  Hematologic/Lymphatic Swollen glands?: No Easy bruising?: No  Cardiovascular Leg swelling?: No Chest pain?: No  Respiratory Cough?: No Shortness of breath?: No  Endocrine Excessive thirst?: No  Musculoskeletal Back pain?: Yes Joint pain?: Yes  Neurological Headaches?: No Dizziness?: No  Psychologic Depression?: No Anxiety?: No  Physical Exam: BP 112/66   Pulse (!) 52   Ht 5\' 7"  (1.702 m)   Wt 153 lb (69.4 kg)   BMI 23.96 kg/m   Constitutional:  Alert and oriented, No acute distress. HEENT: [place] AT, moist mucus membranes.  Trachea midline, no masses. Cardiovascular: No clubbing, cyanosis, or edema. Respiratory: Normal respiratory effort, no increased work of breathing. Skin:  No rashes, bruises or suspicious lesions. Neurologic: Grossly intact, no focal deficits, moving all 4 extremities. Psychiatric: Normal mood and affect.  Laboratory Data: Results for orders placed or performed during the hospital encounter of 05/28/19  Calculi, with Photograph (to Clinical Lab)  Result Value Ref Range   Source Calculi Comment    Color Calculi Orange    Size Calculi 4x6 mm   Weight Calculi 2,105 mg   Composition Calculi Comment    Cystine Calculi 100 %   Photo Calculi Comment    Comment Calculi 3 Comment    Please Note: Comment    DISCLAIMER: Comment    Assessment & Plan:   1. Staghorn calculus Today we discussed the plan to complete his staged procedure, this time with a ureteroscopic approach with laser lithotripsy and stent exchange.  We discussed the risks and benefits of this procedure including bleeding, infection,  damage to surrounding structures, efficacy with need for possible further intervention, and need for temporary ureteral stent.  Patient expressed understanding and denied having questions at this time.  He previously received cardiac clearance in advance of his PCNL; I do not suspect he will have to repeat this.  He does take Plavix; he will need to hold this again in advance of procedure.  He has been prescribed meloxicam previously, however states he has not taken this in quite some time.  I informed him that he should hold this as well in advance of procedure.  Next available surgery date with Dr. Erlene Quan is in approximately 1 month; advised patient that our surgical scheduler would contact him with further instructions.  I would like him to follow-up with Dr. Erlene Quan 1 month after this procedure with renal ultrasound prior to discuss possible intervention of his right-sided staghorn stone.  Debroah Loop, PA-C  [facility] [address] [place], [place] [address] [phone]

## 2019-07-04 ENCOUNTER — Other Ambulatory Visit: Payer: Self-pay

## 2019-07-04 ENCOUNTER — Emergency Department: Payer: Medicare Other

## 2019-07-04 ENCOUNTER — Encounter: Payer: Self-pay | Admitting: Emergency Medicine

## 2019-07-04 ENCOUNTER — Inpatient Hospital Stay
Admission: EM | Admit: 2019-07-04 | Discharge: 2019-07-07 | DRG: 389 | Disposition: A | Discharge status: Home / Self Care | Payer: Medicare Other | Source: Ambulatory Visit | Attending: Internal Medicine | Admitting: Internal Medicine

## 2019-07-04 DIAGNOSIS — Z20828 Contact with and (suspected) exposure to other viral communicable diseases: Secondary | ICD-10-CM | POA: Diagnosis present

## 2019-07-04 DIAGNOSIS — N200 Calculus of kidney: Secondary | ICD-10-CM | POA: Diagnosis present

## 2019-07-04 DIAGNOSIS — I70219 Atherosclerosis of native arteries of extremities with intermittent claudication, unspecified extremity: Secondary | ICD-10-CM | POA: Diagnosis present

## 2019-07-04 DIAGNOSIS — Z79899 Other long term (current) drug therapy: Secondary | ICD-10-CM

## 2019-07-04 DIAGNOSIS — N1832 Chronic kidney disease, stage 3b: Secondary | ICD-10-CM | POA: Diagnosis present

## 2019-07-04 DIAGNOSIS — I429 Cardiomyopathy, unspecified: Secondary | ICD-10-CM | POA: Diagnosis present

## 2019-07-04 DIAGNOSIS — I10 Essential (primary) hypertension: Secondary | ICD-10-CM | POA: Diagnosis present

## 2019-07-04 DIAGNOSIS — Z825 Family history of asthma and other chronic lower respiratory diseases: Secondary | ICD-10-CM

## 2019-07-04 DIAGNOSIS — R140 Abdominal distension (gaseous): Secondary | ICD-10-CM

## 2019-07-04 DIAGNOSIS — Z79891 Long term (current) use of opiate analgesic: Secondary | ICD-10-CM

## 2019-07-04 DIAGNOSIS — I130 Hypertensive heart and chronic kidney disease with heart failure and stage 1 through stage 4 chronic kidney disease, or unspecified chronic kidney disease: Secondary | ICD-10-CM | POA: Diagnosis present

## 2019-07-04 DIAGNOSIS — I5022 Chronic systolic (congestive) heart failure: Secondary | ICD-10-CM | POA: Diagnosis present

## 2019-07-04 DIAGNOSIS — E162 Hypoglycemia, unspecified: Secondary | ICD-10-CM | POA: Diagnosis not present

## 2019-07-04 DIAGNOSIS — E785 Hyperlipidemia, unspecified: Secondary | ICD-10-CM | POA: Diagnosis present

## 2019-07-04 DIAGNOSIS — I2510 Atherosclerotic heart disease of native coronary artery without angina pectoris: Secondary | ICD-10-CM | POA: Diagnosis present

## 2019-07-04 DIAGNOSIS — Z87891 Personal history of nicotine dependence: Secondary | ICD-10-CM | POA: Diagnosis not present

## 2019-07-04 DIAGNOSIS — J449 Chronic obstructive pulmonary disease, unspecified: Secondary | ICD-10-CM | POA: Diagnosis present

## 2019-07-04 DIAGNOSIS — M1990 Unspecified osteoarthritis, unspecified site: Secondary | ICD-10-CM | POA: Diagnosis present

## 2019-07-04 DIAGNOSIS — Z981 Arthrodesis status: Secondary | ICD-10-CM

## 2019-07-04 DIAGNOSIS — Z7902 Long term (current) use of antithrombotics/antiplatelets: Secondary | ICD-10-CM

## 2019-07-04 DIAGNOSIS — K56609 Unspecified intestinal obstruction, unspecified as to partial versus complete obstruction: Secondary | ICD-10-CM | POA: Diagnosis present

## 2019-07-04 DIAGNOSIS — G8929 Other chronic pain: Secondary | ICD-10-CM | POA: Diagnosis present

## 2019-07-04 DIAGNOSIS — K56600 Partial intestinal obstruction, unspecified as to cause: Secondary | ICD-10-CM | POA: Diagnosis present

## 2019-07-04 HISTORY — DX: Diverticulitis of intestine, part unspecified, without perforation or abscess without bleeding: K57.92

## 2019-07-04 LAB — CBC
HCT: 39.7 % (ref 39.0–52.0)
Hemoglobin: 13.5 g/dL (ref 13.0–17.0)
MCH: 26.4 pg (ref 26.0–34.0)
MCHC: 34 g/dL (ref 30.0–36.0)
MCV: 77.5 fL — ABNORMAL LOW (ref 80.0–100.0)
Platelets: 255 10*3/uL (ref 150–400)
RBC: 5.12 MIL/uL (ref 4.22–5.81)
RDW: 15.4 % (ref 11.5–15.5)
WBC: 16.8 10*3/uL — ABNORMAL HIGH (ref 4.0–10.5)
nRBC: 0 % (ref 0.0–0.2)

## 2019-07-04 LAB — COMPREHENSIVE METABOLIC PANEL
ALT: 12 U/L (ref 0–44)
AST: 25 U/L (ref 15–41)
Albumin: 4.4 g/dL (ref 3.5–5.0)
Alkaline Phosphatase: 69 U/L (ref 38–126)
Anion gap: 12 (ref 5–15)
BUN: 43 mg/dL — ABNORMAL HIGH (ref 8–23)
CO2: 27 mmol/L (ref 22–32)
Calcium: 10 mg/dL (ref 8.9–10.3)
Chloride: 92 mmol/L — ABNORMAL LOW (ref 98–111)
Creatinine, Ser: 1.99 mg/dL — ABNORMAL HIGH (ref 0.61–1.24)
GFR calc Af Amer: 41 mL/min — ABNORMAL LOW (ref >60)
GFR calc non Af Amer: 35 mL/min — ABNORMAL LOW (ref >60)
Glucose, Bld: 152 mg/dL — ABNORMAL HIGH (ref 70–99)
Potassium: 5 mmol/L (ref 3.5–5.1)
Sodium: 131 mmol/L — ABNORMAL LOW (ref 135–145)
Total Bilirubin: 1.2 mg/dL (ref 0.3–1.2)
Total Protein: 8 g/dL (ref 6.5–8.1)

## 2019-07-04 LAB — URINALYSIS, COMPLETE (UACMP) WITH MICROSCOPIC
Bacteria, UA: NONE SEEN
Bilirubin Urine: NEGATIVE
Glucose, UA: NEGATIVE mg/dL
Ketones, ur: NEGATIVE mg/dL
Nitrite: NEGATIVE
Protein, ur: 30 mg/dL — AB
Specific Gravity, Urine: 1.01 (ref 1.005–1.030)
pH: 6 (ref 5.0–8.0)

## 2019-07-04 LAB — LIPASE, BLOOD: Lipase: 19 U/L (ref 11–51)

## 2019-07-04 LAB — RESPIRATORY PANEL BY RT PCR (FLU A&B, COVID)
Influenza A by PCR: NEGATIVE
Influenza B by PCR: NEGATIVE
SARS Coronavirus 2 by RT PCR: NEGATIVE

## 2019-07-04 MED ORDER — ONDANSETRON HCL 4 MG/2ML IJ SOLN
4.0000 mg | Freq: Once | INTRAMUSCULAR | Status: AC
  Administered 2019-07-04: 09:00:00 4 mg via INTRAVENOUS
  Filled 2019-07-04: qty 2

## 2019-07-04 MED ORDER — MORPHINE SULFATE (PF) 4 MG/ML IV SOLN
4.0000 mg | Freq: Once | INTRAVENOUS | Status: AC
  Administered 2019-07-04: 09:00:00 4 mg via INTRAVENOUS
  Filled 2019-07-04: qty 1

## 2019-07-04 MED ORDER — ONDANSETRON HCL 4 MG/2ML IJ SOLN: 4.0000 mg | Freq: Four times a day (QID) | INTRAMUSCULAR | Status: DC | PRN

## 2019-07-04 MED ORDER — SODIUM CHLORIDE 0.9% FLUSH
3.0000 mL | Freq: Once | INTRAVENOUS | Status: AC
  Administered 2019-07-04: 3 mL via INTRAVENOUS

## 2019-07-04 MED ORDER — SPIRONOLACTONE 25 MG PO TABS: 25.0000 mg | ORAL_TABLET | Freq: Every day | ORAL | Status: DC

## 2019-07-04 MED ORDER — LACTATED RINGERS IV SOLN: INTRAVENOUS | Status: DC

## 2019-07-04 MED ORDER — ALPRAZOLAM 0.5 MG PO TABS
1.0000 mg | ORAL_TABLET | Freq: Two times a day (BID) | ORAL | Status: DC
  Administered 2019-07-04 – 2019-07-07 (×6): 1 mg via ORAL
  Filled 2019-07-04 (×6): qty 2

## 2019-07-04 MED ORDER — CARVEDILOL 25 MG PO TABS
25.0000 mg | ORAL_TABLET | Freq: Two times a day (BID) | ORAL | Status: DC
  Administered 2019-07-04 – 2019-07-07 (×6): 25 mg via ORAL
  Filled 2019-07-04 (×6): qty 1

## 2019-07-04 MED ORDER — ENOXAPARIN SODIUM 40 MG/0.4ML ~~LOC~~ SOLN
40.0000 mg | SUBCUTANEOUS | Status: DC
  Administered 2019-07-04 – 2019-07-06 (×3): 40 mg via SUBCUTANEOUS
  Filled 2019-07-04 (×4): qty 0.4

## 2019-07-04 MED ORDER — MORPHINE SULFATE (PF) 4 MG/ML IV SOLN
4.0000 mg | Freq: Once | INTRAVENOUS | Status: AC
  Administered 2019-07-04: 11:00:00 4 mg via INTRAVENOUS
  Filled 2019-07-04: qty 1

## 2019-07-04 MED ORDER — ONDANSETRON HCL 4 MG PO TABS: 4.0000 mg | ORAL_TABLET | Freq: Four times a day (QID) | ORAL | Status: DC | PRN

## 2019-07-04 MED ORDER — HYDRALAZINE HCL 20 MG/ML IJ SOLN: 10.0000 mg | Freq: Four times a day (QID) | INTRAMUSCULAR | Status: DC | PRN

## 2019-07-04 MED ORDER — FLUTICASONE PROPIONATE 50 MCG/ACT NA SUSP
1.0000 | Freq: Every day | NASAL | Status: DC | PRN
  Filled 2019-07-04: qty 16

## 2019-07-04 MED ORDER — ACETAMINOPHEN 325 MG PO TABS: 650.0000 mg | ORAL_TABLET | Freq: Four times a day (QID) | ORAL | Status: DC | PRN

## 2019-07-04 MED ORDER — NICOTINE 14 MG/24HR TD PT24
14.0000 mg | MEDICATED_PATCH | Freq: Every day | TRANSDERMAL | Status: DC
  Administered 2019-07-04 – 2019-07-06 (×3): 14 mg via TRANSDERMAL
  Filled 2019-07-04 (×3): qty 1

## 2019-07-04 MED ORDER — LOSARTAN POTASSIUM 50 MG PO TABS
50.0000 mg | ORAL_TABLET | ORAL | Status: DC
  Administered 2019-07-06 – 2019-07-07 (×2): 50 mg via ORAL
  Filled 2019-07-04 (×3): qty 1

## 2019-07-04 MED ORDER — IOHEXOL 300 MG/ML  SOLN
75.0000 mL | Freq: Once | INTRAMUSCULAR | Status: AC | PRN
  Administered 2019-07-04: 75 mL via INTRAVENOUS

## 2019-07-04 MED ORDER — MORPHINE SULFATE (PF) 2 MG/ML IV SOLN
2.0000 mg | INTRAVENOUS | Status: DC | PRN
  Administered 2019-07-04 – 2019-07-07 (×12): 2 mg via INTRAVENOUS
  Filled 2019-07-04 (×12): qty 1

## 2019-07-04 MED ORDER — ACETAMINOPHEN 650 MG RE SUPP: 650.0000 mg | Freq: Four times a day (QID) | RECTAL | Status: DC | PRN

## 2019-07-04 MED ORDER — SODIUM CHLORIDE 0.9 % IV SOLN: Freq: Once | INTRAVENOUS | Status: AC

## 2019-07-04 MED ORDER — SODIUM CHLORIDE 0.9% FLUSH
3.0000 mL | Freq: Two times a day (BID) | INTRAVENOUS | Status: DC
  Administered 2019-07-04 – 2019-07-07 (×6): 3 mL via INTRAVENOUS

## 2019-07-04 NOTE — ED Provider Notes (Signed)
[facility] Emergency Department Provider Note       Time seen: ----------------------------------------- 8:58 AM on 07/04/2019 -----------------------------------------   I have reviewed the triage vital signs and the nursing notes.  HISTORY   Chief Complaint Abdominal Pain and Nausea    HPI [name] is a 63 y.o. male with a history of cardiomyopathy, congestive heart failure, coronary artery disease, diverticulitis who presents to the ED for abdominal pain that is cramping and stabbing with some nausea and vomiting.  Pain is worse in the left lower quadrant, reports he has had perforated diverticulitis before.  Past Medical History:  Diagnosis Date  . Arthritis    knees, lower back  . Bronchitis   . Cardiomyopathy ([place])   . CHF (congestive heart failure) ([place])   . Chronic back pain   . Complication of anesthesia    has trouble keeping pt asleep due to taking narcotics qid  . Coronary artery disease   . Diverticulitis   . Hepatitis C virus infection without hepatic coma    received treatment. clear now  . History of kidney stones   . Hypertension     Patient Active Problem List   Diagnosis Date Noted  . Staghorn calculus 05/28/2019  . Elevated rheumatoid factor 12/12/2018  . Pain syndrome, chronic 02/24/2018  . Acute respiratory failure with hypoxia ([place]) 12/04/2017  . COPD (chronic obstructive pulmonary disease) ([place]) 12/04/2017  . Acute bronchitis 12/04/2017  . Atherosclerosis of native arteries of extremity with intermittent claudication ([place]) 11/10/2017  . Leg pain 11/10/2017  . Hyperlipidemia 11/10/2017  . DJD (degenerative joint disease) 11/10/2017  . Essential hypertension 11/10/2017  . Lumbar spondylosis 08/17/2017  . Loss of weight   . Polyp of sigmoid colon   . Gastritis without bleeding   . Kidney stone 04/16/2014  . Gross hematuria 04/16/2013  . Nephrolithiasis 04/16/2013    Past Surgical History:  Procedure  Laterality Date  . BACK SURGERY    . CARDIAC CATHETERIZATION    . CHOLECYSTECTOMY    . COLON RESECTION  2007   due to diverticulitis  . COLONOSCOPY WITH PROPOFOL N/A 03/29/2017   Procedure: COLONOSCOPY WITH PROPOFOL;  Surgeon: Lucilla Lame, MD;  Location: [facility] ENDOSCOPY;  Service: Endoscopy;  Laterality: N/A;  . ESOPHAGOGASTRODUODENOSCOPY (EGD) WITH PROPOFOL N/A 03/29/2017   Procedure: ESOPHAGOGASTRODUODENOSCOPY (EGD) WITH PROPOFOL;  Surgeon: Lucilla Lame, MD;  Location: [facility] ENDOSCOPY;  Service: Endoscopy;  Laterality: N/A;  . FINGER SURGERY  2007   cut finger off with saw and was reattached  . HERNIA REPAIR    . HOLMIUM LASER APPLICATION N/A [id number]   Procedure: HOLMIUM LASER APPLICATION;  Surgeon: Hollice Espy, MD;  Location: [facility] ORS;  Service: Urology;  Laterality: N/A;  . IR NEPHROSTOMY PLACEMENT LEFT  05/28/2019  . KNEE ARTHROSCOPY Bilateral   . LOWER EXTREMITY ANGIOGRAPHY Right 11/09/2017   Procedure: LOWER EXTREMITY ANGIOGRAPHY;  Surgeon: Katha Cabal, MD;  Location: [place] CV LAB;  Service: Cardiovascular;  Laterality: Right;  . LOWER EXTREMITY ANGIOGRAPHY Right 11/21/2018   Procedure: LOWER EXTREMITY ANGIOGRAPHY;  Surgeon: Katha Cabal, MD;  Location: [place] CV LAB;  Service: Cardiovascular;  Laterality: Right;  . NEPHROLITHOTOMY Left 05/28/2019   Procedure: NEPHROLITHOTOMY PERCUTANEOUS;  Surgeon: Hollice Espy, MD;  Location: [facility] ORS;  Service: Urology;  Laterality: Left;  . SPINAL FUSION      Allergies Codeine and Dilaudid [hydromorphone]  Social History Social History   Tobacco Use  . Smoking status: Former Smoker  Packs/day: 1.00    Years: 25.00    Pack years: 25.00    Types: Cigarettes    Quit date: 2013    Years since quitting: 7.9  . Smokeless tobacco: Never Used  Substance Use Topics  . Alcohol use: No  . Drug use: No   Review of Systems Constitutional: Negative for fever. Cardiovascular: Negative for chest  pain. Respiratory: Negative for shortness of breath. Gastrointestinal: Positive for abdominal pain, nausea vomiting Musculoskeletal: Negative for back pain. Skin: Negative for rash. Neurological: Negative for headaches, focal weakness or numbness.  All systems negative/normal/unremarkable except as stated in the HPI  ____________________________________________   PHYSICAL EXAM:  VITAL SIGNS: ED Triage Vitals [07/04/19 0847]  Enc Vitals Group     BP      Pulse      Resp      Temp      Temp src      SpO2      Weight 145 lb (65.8 kg)     Height 5\' 7"  (1.702 m)     Head Circumference      Peak Flow      Pain Score 10     Pain Loc      Pain Edu?      Excl. in [place]?     Constitutional: Alert and oriented.  Mild to moderate distress Eyes: Conjunctivae are normal. Normal extraocular movements. ENT      Head: Normocephalic and atraumatic.      Nose: No congestion/rhinnorhea.      Mouth/Throat: Mucous membranes are moist.      Neck: No stridor. Cardiovascular: Normal rate, regular rhythm. No murmurs, rubs, or gallops. Respiratory: Normal respiratory effort without tachypnea nor retractions. Breath sounds are clear and equal bilaterally. No wheezes/rales/rhonchi. Gastrointestinal: Specific left lower quadrant tenderness, normal bowel sounds, possible guarding Musculoskeletal: Nontender with normal range of motion in extremities. No lower extremity tenderness nor edema. Neurologic:  Normal speech and language. No gross focal neurologic deficits are appreciated.  Skin:  Skin is warm, dry and intact. No rash noted. Psychiatric: Mood and affect are normal. Speech and behavior are normal.   ____________________________________________  ED COURSE:  As part of my medical decision making, I reviewed the following data within the [place] History obtained from family if available, nursing notes, old chart and ekg, as well as notes from prior ED visits. Patient  presented for abdominal pain, we will assess with labs and imaging as indicated at this time.  EKG: Interpreted by me, sinus rhythm with rate of 87 bpm, left bundle branch block, long QT   Procedures  [name] was evaluated in Emergency Department on 07/04/2019 for the symptoms described in the history of present illness. He was evaluated in the context of the global COVID-19 pandemic, which necessitated consideration that the patient might be at risk for infection with the SARS-CoV-2 virus that causes COVID-19. Institutional protocols and algorithms that pertain to the evaluation of patients at risk for COVID-19 are in a state of rapid change based on information released by regulatory bodies including the CDC and federal and state organizations. These policies and algorithms were followed during the patient's care in the ED.  ____________________________________________   LABS (pertinent positives/negatives)  Labs Reviewed  COMPREHENSIVE METABOLIC PANEL - Abnormal; Notable for the following components:      Result Value   Sodium 131 (*)    Chloride 92 (*)    Glucose, Bld 152 (*)  BUN 43 (*)    Creatinine, Ser 1.99 (*)    GFR calc non Af Amer 35 (*)    GFR calc Af Amer 41 (*)    All other components within normal limits  CBC - Abnormal; Notable for the following components:   WBC 16.8 (*)    MCV 77.5 (*)    All other components within normal limits  URINALYSIS, COMPLETE (UACMP) WITH MICROSCOPIC - Abnormal; Notable for the following components:   Color, Urine YELLOW (*)    APPearance CLEAR (*)    Hgb urine dipstick MODERATE (*)    Protein, ur 30 (*)    Leukocytes,Ua SMALL (*)    All other components within normal limits  LIPASE, BLOOD   CRITICAL CARE Performed by: Laurence Aly   Total critical care time: 30 minutes  Critical care time was exclusive of separately billable procedures and treating other patients.  Critical care was necessary to treat or prevent  imminent or life-threatening deterioration.  Critical care was time spent personally by me on the following activities: development of treatment plan with patient and/or surrogate as well as nursing, discussions with consultants, evaluation of patient's response to treatment, examination of patient, obtaining history from patient or surrogate, ordering and performing treatments and interventions, ordering and review of laboratory studies, ordering and review of radiographic studies, pulse oximetry and re-evaluation of patient's condition.   RADIOLOGY Images were viewed by me  CT of the abdomen pelvis with contrast  IMPRESSION:  1. Small bowel obstruction with transition zone at the mid ileal  level. No free air or portal venous air. No demonstrable abscess. No  periappendiceal region inflammation.   2. Double-J stent on the left extending from the left renal pelvis  to the bladder. Slight hydronephrosis present on the left.   3. Staghorn calculus lower pole right kidney measuring 2.7 x 1.7 cm.  Calculus in mid left kidney measuring 0.8 x 0.6 cm. Multiple  prostatic calculi noted.   4. Loculated ascites in the pelvis. This ascites may be secondary to  the bowel obstruction.   5. Thickening of the wall of the distal esophagus is likely  indicative of distal esophagitis.   6. Aortic Atherosclerosis (ICD10-I70.0).   7. Absent gallbladder.   8. Calcified splenic granulomas.  ____________________________________________   DIFFERENTIAL DIAGNOSIS   Diverticulitis, renal colic, UTI, pyelonephritis, obstruction, chronic pain  FINAL ASSESSMENT AND PLAN  Small bowel obstruction   Plan: The patient had presented for severe abdominal pain. Patient's labs did reveal leukocytosis which is acute on chronic for him as well as chronic kidney disease which is also chronic. Patient's imaging revealed a small bowel obstruction with transition zone in the mid ileus.  His stomach is very  distended, we will place an NG tube.  I have discussed with general surgery and will consult the hospitalist for admission.   Laurence Aly, MD    Note: This note was generated in part or whole with voice recognition software. Voice recognition is usually quite accurate but there are transcription errors that can and very often do occur. I apologize for any typographical errors that were not detected and corrected.     Earleen Newport, MD 07/04/19 [phone]

## 2019-07-04 NOTE — Consult Note (Signed)
SURGICAL CONSULTATION NOTE   HISTORY OF PRESENT ILLNESS (HPI):  63 y.o. male presented to [facility] ED for evaluation of abdominal pain. Patient reports started with abdominal pain since last night. Pain initially on the mid abdomen but is basically generalized. There is no pain radiation. There is no alleviating or aggravating factor. He reported that he has been having bowel movement every day. Last bowel movement yesterday morning. No bowel movement today. He cannot recall passing gas today. He endorses having some nausea and vomiting.  Upon arrival to ED he was found with leukocytosis. CT scan of the abdomen showed small bowel dilation. I personally evaluated the images. There is no free fluid or free air.  He has history of diverticulitis and surgery for diverticular disease. Patient reported that he also had one episode of small bowel obstruction that resolved with conservative management.  Surgery is consulted by Dr. Jimmye Norman in this context for evaluation and management of small bowel obstruction.  PAST MEDICAL HISTORY (PMH):  Past Medical History:  Diagnosis Date  . Arthritis    knees, lower back  . Bronchitis   . Cardiomyopathy ([place])   . CHF (congestive heart failure) ([place])   . Chronic back pain   . Complication of anesthesia    has trouble keeping pt asleep due to taking narcotics qid  . Coronary artery disease   . Diverticulitis   . Hepatitis C virus infection without hepatic coma    received treatment. clear now  . History of kidney stones   . Hypertension      PAST SURGICAL HISTORY ([place]):  Past Surgical History:  Procedure Laterality Date  . BACK SURGERY    . CARDIAC CATHETERIZATION    . CHOLECYSTECTOMY    . COLON RESECTION  2007   due to diverticulitis  . COLONOSCOPY WITH PROPOFOL N/A 03/29/2017   Procedure: COLONOSCOPY WITH PROPOFOL;  Surgeon: Lucilla Lame, MD;  Location: [facility] ENDOSCOPY;  Service: Endoscopy;  Laterality: N/A;  . ESOPHAGOGASTRODUODENOSCOPY (EGD)  WITH PROPOFOL N/A 03/29/2017   Procedure: ESOPHAGOGASTRODUODENOSCOPY (EGD) WITH PROPOFOL;  Surgeon: Lucilla Lame, MD;  Location: [facility] ENDOSCOPY;  Service: Endoscopy;  Laterality: N/A;  . FINGER SURGERY  2007   cut finger off with saw and was reattached  . HERNIA REPAIR    . HOLMIUM LASER APPLICATION N/A [id number]   Procedure: HOLMIUM LASER APPLICATION;  Surgeon: Hollice Espy, MD;  Location: [facility] ORS;  Service: Urology;  Laterality: N/A;  . IR NEPHROSTOMY PLACEMENT LEFT  05/28/2019  . KNEE ARTHROSCOPY Bilateral   . LOWER EXTREMITY ANGIOGRAPHY Right 11/09/2017   Procedure: LOWER EXTREMITY ANGIOGRAPHY;  Surgeon: Katha Cabal, MD;  Location: [place] CV LAB;  Service: Cardiovascular;  Laterality: Right;  . LOWER EXTREMITY ANGIOGRAPHY Right 11/21/2018   Procedure: LOWER EXTREMITY ANGIOGRAPHY;  Surgeon: Katha Cabal, MD;  Location: [place] CV LAB;  Service: Cardiovascular;  Laterality: Right;  . NEPHROLITHOTOMY Left 05/28/2019   Procedure: NEPHROLITHOTOMY PERCUTANEOUS;  Surgeon: Hollice Espy, MD;  Location: [facility] ORS;  Service: Urology;  Laterality: Left;  . SPINAL FUSION       MEDICATIONS:  Prior to Admission medications   Medication Sig Start Date End Date Taking? Authorizing Provider  albuterol (VENTOLIN HFA) 108 (90 Base) MCG/ACT inhaler Inhale 2 puffs into the lungs every 6 (six) hours as needed for wheezing or shortness of breath.   Yes [provider]  ALPRAZolam Duanne Moron) 1 MG tablet Take 1 mg by mouth 2 (two) times daily.  01/06/16  Yes [provider]  carvedilol (COREG) 25 MG tablet Take 25 mg by mouth 2 (two) times daily.    Yes [provider]  clopidogrel (PLAVIX) 75 MG tablet Take 1 tablet (75 mg total) by mouth daily. 11/09/17  Yes Schnier, Dolores Lory, MD  fluticasone (FLONASE) 50 MCG/ACT nasal spray Place 1 spray into both nostrils daily as needed for allergies.  01/20/17  Yes [provider]  furosemide (LASIX) 40 MG tablet  Take 40 mg by mouth daily as needed (fluid retention.).   Yes [provider]  ipratropium-albuterol (DUONEB) 0.5-2.5 (3) MG/3ML SOLN Take 3 mLs by nebulization every 4 (four) hours as needed (for SOB). 12/05/17  Yes Vaughan Basta, MD  losartan (COZAAR) 50 MG tablet Take 50 mg by mouth every morning.  02/15/19 07/04/19 Yes [provider]  meloxicam (MOBIC) 15 MG tablet Take 15 mg by mouth daily as needed for pain.    Yes [provider]  oxyCODONE (ROXICODONE) 15 MG immediate release tablet Take 15 mg by mouth 4 (four) times daily.  05/07/19  Yes [provider]  rosuvastatin (CRESTOR) 40 MG tablet Take 40 mg by mouth every evening.  09/20/18  Yes [provider]  spironolactone (ALDACTONE) 25 MG tablet Take 25 mg by mouth daily. 05/07/19  Yes [provider]     ALLERGIES:  Allergies  Allergen Reactions  . Codeine Itching  . Dilaudid [Hydromorphone] Other (See Comments)    Severe headache     SOCIAL HISTORY:  Social History   Socioeconomic History  . Marital status: Married    Spouse name: Not on file  . Number of children: Not on file  . Years of education: Not on file  . Highest education level: Not on file  Occupational History  . Not on file  Tobacco Use  . Smoking status: Former Smoker    Packs/day: 1.00    Years: 25.00    Pack years: 25.00    Types: Cigarettes    Quit date: 2013    Years since quitting: 7.9  . Smokeless tobacco: Never Used  Substance and Sexual Activity  . Alcohol use: No  . Drug use: No  . Sexual activity: Not on file  Other Topics Concern  . Not on file  Social History Narrative  . Not on file   Social Determinants of Health   Financial Resource Strain:   . Difficulty of Paying Living Expenses: Not on file  Food Insecurity:   . Worried About Charity fundraiser in the Last Year: Not on file  . Ran Out of Food in the Last Year: Not on file  Transportation Needs:   . Lack of  Transportation (Medical): Not on file  . Lack of Transportation (Non-Medical): Not on file  Physical Activity:   . Days of Exercise per Week: Not on file  . Minutes of Exercise per Session: Not on file  Stress:   . Feeling of Stress : Not on file  Social Connections:   . Frequency of Communication with Friends and Family: Not on file  . Frequency of Social Gatherings with Friends and Family: Not on file  . Attends Religious Services: Not on file  . Active Member of Clubs or Organizations: Not on file  . Attends Archivist Meetings: Not on file  . Marital Status: Not on file  Intimate Partner Violence:   . Fear of Current or Ex-Partner: Not on file  . Emotionally Abused: Not on file  . Physically Abused: Not  on file  . Sexually Abused: Not on file    The patient currently resides (home / rehab facility / nursing home): Home The patient normally is (ambulatory / bedbound): Ambulatory   FAMILY HISTORY:  Family History  Problem Relation Age of Onset  . COPD Mother      REVIEW OF SYSTEMS:  Constitutional: denies weight loss, fever, chills, or sweats  Eyes: denies any other vision changes, history of eye injury  ENT: denies sore throat, hearing problems  Respiratory: denies shortness of breath, wheezing  Cardiovascular: denies chest pain, palpitations  Gastrointestinal: Positive abdominal pain, N/V Genitourinary: denies burning with urination or urinary frequency Musculoskeletal: denies any other joint pains or cramps  Skin: denies any other rashes or skin discolorations  Neurological: denies any other headache, dizziness, weakness  Psychiatric: denies any other depression, anxiety   All other review of systems were negative   VITAL SIGNS:  Pulse Rate:  [79-83] 80 (12/23 1230) Resp:  [13-16] 13 (12/23 1230) BP: (126-137)/(65-78) 137/69 (12/23 1230) SpO2:  [95 %-96 %] 96 % (12/23 1230) Weight:  [65.8 kg] 65.8 kg (12/23 0847)     Height: 5\' 7"  (170.2 cm) Weight:  65.8 kg BMI (Calculated): 22.71   INTAKE/OUTPUT:  This shift: Total I/O In: 1003 [I.V.:1003] Out: 1050 [Other:1050]  Last 2 shifts: @IOLAST2SHIFTS @   PHYSICAL EXAM:  Constitutional:  -- Normal body habitus  -- Awake, alert, and oriented x3  Eyes:  -- Pupils equally round and reactive to light  -- No scleral icterus  Ear, nose, and throat:  -- No jugular venous distension  Pulmonary:  -- No crackles  -- Equal breath sounds bilaterally -- Breathing non-labored at rest Cardiovascular:  -- S1, S2 present  -- No pericardial rubs Gastrointestinal:  -- Abdomen soft, mild tender, distended, no guarding or rebound tenderness -- No abdominal masses appreciated, pulsatile or otherwise  Musculoskeletal and Integumentary:  -- Wounds or skin discoloration: None appreciated -- Extremities: B/L UE and LE FROM, hands and feet warm, no edema  Neurologic:  -- Motor function: intact and symmetric -- Sensation: intact and symmetric   Labs:  CBC Latest Ref Rng & Units 07/04/2019 05/29/2019 05/28/2019  WBC 4.0 - 10.5 K/uL 16.8(H) 13.5(H) 15.3(H)  Hemoglobin 13.0 - 17.0 g/dL 13.5 11.1(L) 12.1(L)  Hematocrit 39.0 - 52.0 % 39.7 32.8(L) 38.3(L)  Platelets 150 - 400 K/uL 255 229 235   CMP Latest Ref Rng & Units 07/04/2019 05/29/2019 05/22/2019  Glucose 70 - 99 mg/dL 152(H) 188(H) 96  BUN 8 - 23 mg/dL 43(H) 39(H) 52(H)  Creatinine 0.61 - 1.24 mg/dL 1.99(H) 1.81(H) 1.83(H)  Sodium 135 - 145 mmol/L 131(L) 134(L) 139  Potassium 3.5 - 5.1 mmol/L 5.0 4.9 4.7  Chloride 98 - 111 mmol/L 92(L) 105 105  CO2 22 - 32 mmol/L 27 22 26   Calcium 8.9 - 10.3 mg/dL 10.0 8.2(L) 9.1  Total Protein 6.5 - 8.1 g/dL 8.0 - -  Total Bilirubin 0.3 - 1.2 mg/dL 1.2 - -  Alkaline Phos 38 - 126 U/L 69 - -  AST 15 - 41 U/L 25 - -  ALT 0 - 44 U/L 12 - -    Imaging studies:  EXAM: CT ABDOMEN AND PELVIS WITH CONTRAST  TECHNIQUE: Multidetector CT imaging of the abdomen and pelvis was performed using the standard  protocol following bolus administration of intravenous contrast.  CONTRAST:  4mL OMNIPAQUE IOHEXOL 300 MG/ML  SOLN  COMPARISON:  March 22, 2014  FINDINGS: Lower chest: There is slight atelectasis  in the lung bases. Lung bases otherwise are clear. There is thickening of the distal esophageal wall.  Hepatobiliary: No focal liver lesions are appreciable. Gallbladder is absent. There is no biliary duct dilatation.  Pancreas: There is no pancreatic mass or inflammatory focus.  Spleen: There are calcified granulomas throughout the spleen. Spleen otherwise appears unremarkable.  Adrenals/Urinary Tract: Adrenals appear normal bilaterally. A double-J stent is seen on the left extending from the renal pelvis to the urinary bladder on the left. There is minimal fullness of the left renal collecting system. There is no hydronephrosis on the left. On the right, there is a cyst arising from the posterior upper to midportion of the kidney measuring 3.0 x 2.9 cm there are subcentimeter cysts in the mid to lower poles of the left kidney, largest measuring just under 1 cm. There is a staghorn calculus in the lower pole of the right kidney measuring 2.7 x 1.7 cm. There is a calculus in the mid left kidney measuring 0.8 x 0.6 cm. There is no evident ureteral calculus on either side. Urinary bladder is midline with wall thickness within normal limits.  Stomach/Bowel: Stomach is distended with fluid and air. The stomach wall is not thickened. There are multiple loops of dilated small bowel with a transition zone in the mid ileal region consistent with small bowel obstruction. There is no evident free air or portal venous air. A portion of colon has been removed with apparent patent anastomosis. There is moderate stool in colon currently. The terminal ileal region appears unremarkable.  Vascular/Lymphatic: There is no abdominal aortic aneurysm. There is aortic and iliac artery  atherosclerotic calcification. No adenopathy is appreciable in the abdomen or pelvis.  Reproductive: Prostate contains multiple calculi. Prostate and seminal vesicles are normal in size and contour. There is no evident pelvic mass.  Other: Appendix appears normal. There is mild partially loculated ascites in the pelvis. No abscess is evident in the abdomen or pelvis. There is scarring in the region of the umbilicus.  Musculoskeletal: Postoperative changes noted at L5-S1. There is arthropathy in the lumbar spine. No blastic or lytic bone lesions are evident. No intramuscular lesions are appreciable.  IMPRESSION: 1. Small bowel obstruction with transition zone at the mid ileal level. No free air or portal venous air. No demonstrable abscess. No periappendiceal region inflammation.  2. Double-J stent on the left extending from the left renal pelvis to the bladder. Slight hydronephrosis present on the left.  3. Staghorn calculus lower pole right kidney measuring 2.7 x 1.7 cm. Calculus in mid left kidney measuring 0.8 x 0.6 cm. Multiple prostatic calculi noted.  4. Loculated ascites in the pelvis. This ascites may be secondary to the bowel obstruction.  5. Thickening of the wall of the distal esophagus is likely indicative of distal esophagitis.  6.  Aortic Atherosclerosis (ICD10-I70.0).  7.  Absent gallbladder.  8.  Calcified splenic granulomas.  Electronically Signed: By: Lowella Grip III M.D. On: 07/04/2019 10:14  Assessment/Plan:  63 y.o. male with small bowel obstruction, complicated by pertinent comorbidities including congestive heart failure, hypertension, hyperlipidemia, nephrolithiasis with stent placement. Patient with clinical history, physical exam and imaging consistent with small bowel obstruction. There is no peritoneal signs on physical exam. There is no indication for emergent surgical management. I agree with current conservative management  with NGT, bowel rest, IV fluids. Initially with decompressed proximal intestine and then considering Gastrografin challenge. I will discuss with hospitalist about possible hold of Plavix in case patient need  surgical management during admission. Appreciate hospitalist admission for management of medical conditions. Will follow closely with physical exam and images. Patient oriented about the goal of managing the condition with conservative management but he knows that if obstruction does not resolve surgical management will need to be considered.  Arnold Long, MD

## 2019-07-04 NOTE — H&P (Addendum)
History and Physical    [name] [id number] DOB: 1956-03-10 DOA: 07/04/2019  PCP: Perrin Maltese, MD   Patient coming from: Home  I have personally briefly reviewed patient's old medical records in [place]  Chief Complaint: Abdominal pain with nausea and vomiting.  HPI: [name] is a 63 y.o. male with medical history significant of cardiomyopathy, congestive heart failure, coronary artery disease, diverticulitis and renal stone with recent stent placed by urology who presents to the ED for abdominal pain that is cramping and stabbing with some nausea and vomiting.  Pain is worse in the left lower quadrant, reports he has had perforated diverticulitis before and underwent surgery for that.  1 prior incidence of SBO few years ago which was managed conservatively. Patient started getting nausea and vomiting with abdominal pain after lunch yesterday.  Denies any fever or chills.  Denies any diarrhea.  Patient has a chronic cough due to smoking, denies any recent change in his cough.  Denies any chest pain or shortness of breath.  Denies any orthopnea or PND.  No sick contacts. Denies any recent urinary symptoms, had a urologic procedure recently due to nephrolithiasis where a stent was placed, going for another procedure early January 2021.  ED Course: Patient was hemodynamically stable, labs positive for leukocytosis at 16.8, AKI with BUN of 43 and creatinine of 1.99, baseline around 1.7-1.8 for the past year, with hematuria with some leukocytes, urine cultures pending.  CT abdomen with small bowel obstruction with transition zone at the mid ileal level. Surgery was consulted, NG tube was inserted and he was admitted for further management.  Review of Systems: As per HPI otherwise 10 point review of systems negative.   Past Medical History:  Diagnosis Date  . Arthritis    knees, lower back  . Bronchitis   . Cardiomyopathy ([place])   . CHF (congestive heart failure)  ([place])   . Chronic back pain   . Complication of anesthesia    has trouble keeping pt asleep due to taking narcotics qid  . Coronary artery disease   . Diverticulitis   . Hepatitis C virus infection without hepatic coma    received treatment. clear now  . History of kidney stones   . Hypertension     Past Surgical History:  Procedure Laterality Date  . BACK SURGERY    . CARDIAC CATHETERIZATION    . CHOLECYSTECTOMY    . COLON RESECTION  2007   due to diverticulitis  . COLONOSCOPY WITH PROPOFOL N/A 03/29/2017   Procedure: COLONOSCOPY WITH PROPOFOL;  Surgeon: Lucilla Lame, MD;  Location: [facility] ENDOSCOPY;  Service: Endoscopy;  Laterality: N/A;  . ESOPHAGOGASTRODUODENOSCOPY (EGD) WITH PROPOFOL N/A 03/29/2017   Procedure: ESOPHAGOGASTRODUODENOSCOPY (EGD) WITH PROPOFOL;  Surgeon: Lucilla Lame, MD;  Location: [facility] ENDOSCOPY;  Service: Endoscopy;  Laterality: N/A;  . FINGER SURGERY  2007   cut finger off with saw and was reattached  . HERNIA REPAIR    . HOLMIUM LASER APPLICATION N/A [id number]   Procedure: HOLMIUM LASER APPLICATION;  Surgeon: Hollice Espy, MD;  Location: [facility] ORS;  Service: Urology;  Laterality: N/A;  . IR NEPHROSTOMY PLACEMENT LEFT  05/28/2019  . KNEE ARTHROSCOPY Bilateral   . LOWER EXTREMITY ANGIOGRAPHY Right 11/09/2017   Procedure: LOWER EXTREMITY ANGIOGRAPHY;  Surgeon: Katha Cabal, MD;  Location: [place] CV LAB;  Service: Cardiovascular;  Laterality: Right;  . LOWER EXTREMITY ANGIOGRAPHY Right 11/21/2018   Procedure: LOWER EXTREMITY ANGIOGRAPHY;  Surgeon: Katha Cabal,  MD;  Location: [place] CV LAB;  Service: Cardiovascular;  Laterality: Right;  . NEPHROLITHOTOMY Left 05/28/2019   Procedure: NEPHROLITHOTOMY PERCUTANEOUS;  Surgeon: Hollice Espy, MD;  Location: [facility] ORS;  Service: Urology;  Laterality: Left;  . SPINAL FUSION       reports that he quit smoking about 7 years ago. His smoking use included cigarettes. He has a 25.00 pack-year  smoking history. He has never used smokeless tobacco. He reports that he does not drink alcohol or use drugs.  Allergies  Allergen Reactions  . Codeine Itching  . Dilaudid [Hydromorphone] Other (See Comments)    Severe headache    Family History  Problem Relation Age of Onset  . COPD Mother     Prior to Admission medications   Medication Sig Start Date End Date Taking? Authorizing Provider  albuterol (VENTOLIN HFA) 108 (90 Base) MCG/ACT inhaler Inhale 2 puffs into the lungs every 6 (six) hours as needed for wheezing or shortness of breath.   Yes [provider]  ALPRAZolam Duanne Moron) 1 MG tablet Take 1 mg by mouth 2 (two) times daily.  01/06/16  Yes [provider]  carvedilol (COREG) 25 MG tablet Take 25 mg by mouth 2 (two) times daily.    Yes [provider]  clopidogrel (PLAVIX) 75 MG tablet Take 1 tablet (75 mg total) by mouth daily. 11/09/17  Yes Schnier, Dolores Lory, MD  fluticasone (FLONASE) 50 MCG/ACT nasal spray Place 1 spray into both nostrils daily as needed for allergies.  01/20/17  Yes [provider]  furosemide (LASIX) 40 MG tablet Take 40 mg by mouth daily as needed (fluid retention.).   Yes [provider]  ipratropium-albuterol (DUONEB) 0.5-2.5 (3) MG/3ML SOLN Take 3 mLs by nebulization every 4 (four) hours as needed (for SOB). 12/05/17  Yes Vaughan Basta, MD  losartan (COZAAR) 50 MG tablet Take 50 mg by mouth every morning.  02/15/19 07/04/19 Yes [provider]  meloxicam (MOBIC) 15 MG tablet Take 15 mg by mouth daily as needed for pain.    Yes [provider]  oxyCODONE (ROXICODONE) 15 MG immediate release tablet Take 15 mg by mouth 4 (four) times daily.  05/07/19  Yes [provider]  rosuvastatin (CRESTOR) 40 MG tablet Take 40 mg by mouth every evening.  09/20/18  Yes [provider]  spironolactone (ALDACTONE) 25 MG tablet Take 25 mg by mouth daily. 05/07/19  Yes [provider]     Physical Exam: Vitals:   07/04/19 0847  Weight: 65.8 kg  Height: 5\' 7"  (1.702 m)    Vitals:   07/04/19 0847  Weight: 65.8 kg  Height: 5\' 7"  (1.702 m)   General: Vital signs reviewed.  Patient is well-developed and well-nourished, in no acute distress and cooperative with exam.  Head: Normocephalic and atraumatic. Eyes: EOMI, conjunctivae normal, no scleral icterus.  ENMT: Mucous membranes are moist. Posterior pharynx clear of any exudate or lesions.Normal dentition.  Neck: Supple, trachea midline, normal ROM, no JVD, masses, thyromegaly, or carotid bruit present.  Cardiovascular: RRR, S1 normal, S2 normal, no murmurs, gallops, or rubs. Pulmonary/Chest: Clear to auscultation bilaterally, no wheezes, rales, or rhonchi. Abdominal: Highly distended, diffusely tender abdomen with some guarding, bowel sounds hypoactive. Musculoskeletal: No joint deformities, erythema, or stiffness, ROM full and nontender. Extremities: No lower extremity edema bilaterally,  pulses symmetric and intact bilaterally. No cyanosis or clubbing. Neurological: A&O x3, Strength is normal and symmetric bilaterally, cranial nerve II-XII are grossly intact, no focal motor deficit,  sensory intact to light touch bilaterally.  Skin: Warm, dry and intact. No rashes or erythema. Psychiatric: Normal mood and affect. speech and behavior is normal. Cognition and memory are normal.   Labs on Admission: I have personally reviewed following labs and imaging studies  CBC: Recent Labs  Lab 07/04/19 0910  WBC 16.8*  HGB 13.5  HCT 39.7  MCV 77.5*  PLT 123456   Basic Metabolic Panel: Recent Labs  Lab 07/04/19 0910  NA 131*  K 5.0  CL 92*  CO2 27  GLUCOSE 152*  BUN 43*  CREATININE 1.99*  CALCIUM 10.0   GFR: Estimated Creatinine Clearance: 36.3 mL/min (A) (by C-G formula based on SCr of 1.99 mg/dL (H)). Liver Function Tests: Recent Labs  Lab 07/04/19 0910  AST 25  ALT 12  ALKPHOS 69  BILITOT 1.2  PROT 8.0   ALBUMIN 4.4   Recent Labs  Lab 07/04/19 0910  LIPASE 19   No results for input(s): AMMONIA in the last 168 hours. Coagulation Profile: No results for input(s): INR, PROTIME in the last 168 hours. Cardiac Enzymes: No results for input(s): CKTOTAL, CKMB, CKMBINDEX, TROPONINI in the last 168 hours. BNP (last 3 results) No results for input(s): PROBNP in the last 8760 hours. HbA1C: No results for input(s): HGBA1C in the last 72 hours. CBG: No results for input(s): GLUCAP in the last 168 hours. Lipid Profile: No results for input(s): CHOL, HDL, LDLCALC, TRIG, CHOLHDL, LDLDIRECT in the last 72 hours. Thyroid Function Tests: No results for input(s): TSH, T4TOTAL, FREET4, T3FREE, THYROIDAB in the last 72 hours. Anemia Panel: No results for input(s): VITAMINB12, FOLATE, FERRITIN, TIBC, IRON, RETICCTPCT in the last 72 hours. Urine analysis:    Component Value Date/Time   COLORURINE YELLOW (A) 07/04/2019 0910   APPEARANCEUR CLEAR (A) 07/04/2019 0910   APPEARANCEUR Hazy (A) 05/17/2019 1323   LABSPEC 1.010 07/04/2019 0910   LABSPEC 1.032 03/23/2014 1721   PHURINE 6.0 07/04/2019 0910   GLUCOSEU NEGATIVE 07/04/2019 0910   GLUCOSEU Negative 03/23/2014 1721   HGBUR MODERATE (A) 07/04/2019 0910   BILIRUBINUR NEGATIVE 07/04/2019 0910   BILIRUBINUR Negative 05/17/2019 1323   BILIRUBINUR Negative 03/23/2014 1721   KETONESUR NEGATIVE 07/04/2019 0910   PROTEINUR 30 (A) 07/04/2019 0910   UROBILINOGEN 0.2 08/07/2007 1255   NITRITE NEGATIVE 07/04/2019 0910   LEUKOCYTESUR SMALL (A) 07/04/2019 0910   LEUKOCYTESUR Trace 03/23/2014 1721    Radiological Exams on Admission: CT ABDOMEN PELVIS W CONTRAST  Addendum Date: 07/04/2019   ADDENDUM REPORT: 07/04/2019 11:04 ADDENDUM: Note that there is asymmetric anterior urinary bladder wall thickening. Suspect inflammation in this area, potentially at least in part secondary to the presence of the double-J stent on the left. Electronically Signed   By:  Lowella Grip III M.D.   On: 07/04/2019 11:04   Result Date: 07/04/2019 CLINICAL DATA:  Diffuse abdominal pain with nausea and vomiting EXAM: CT ABDOMEN AND PELVIS WITH CONTRAST TECHNIQUE: Multidetector CT imaging of the abdomen and pelvis was performed using the standard protocol following bolus administration of intravenous contrast. CONTRAST:  26mL OMNIPAQUE IOHEXOL 300 MG/ML  SOLN COMPARISON:  March 22, 2014 FINDINGS: Lower chest: There is slight atelectasis in the lung bases. Lung bases otherwise are clear. There is thickening of the distal esophageal wall. Hepatobiliary: No focal liver lesions are appreciable. Gallbladder is absent. There is no biliary duct dilatation. Pancreas: There is no pancreatic mass or inflammatory focus. Spleen: There are calcified granulomas throughout the spleen. Spleen otherwise appears unremarkable. Adrenals/Urinary Tract:  Adrenals appear normal bilaterally. A double-J stent is seen on the left extending from the renal pelvis to the urinary bladder on the left. There is minimal fullness of the left renal collecting system. There is no hydronephrosis on the left. On the right, there is a cyst arising from the posterior upper to midportion of the kidney measuring 3.0 x 2.9 cm there are subcentimeter cysts in the mid to lower poles of the left kidney, largest measuring just under 1 cm. There is a staghorn calculus in the lower pole of the right kidney measuring 2.7 x 1.7 cm. There is a calculus in the mid left kidney measuring 0.8 x 0.6 cm. There is no evident ureteral calculus on either side. Urinary bladder is midline with wall thickness within normal limits. Stomach/Bowel: Stomach is distended with fluid and air. The stomach wall is not thickened. There are multiple loops of dilated small bowel with a transition zone in the mid ileal region consistent with small bowel obstruction. There is no evident free air or portal venous air. A portion of colon has been removed with  apparent patent anastomosis. There is moderate stool in colon currently. The terminal ileal region appears unremarkable. Vascular/Lymphatic: There is no abdominal aortic aneurysm. There is aortic and iliac artery atherosclerotic calcification. No adenopathy is appreciable in the abdomen or pelvis. Reproductive: Prostate contains multiple calculi. Prostate and seminal vesicles are normal in size and contour. There is no evident pelvic mass. Other: Appendix appears normal. There is mild partially loculated ascites in the pelvis. No abscess is evident in the abdomen or pelvis. There is scarring in the region of the umbilicus. Musculoskeletal: Postoperative changes noted at L5-S1. There is arthropathy in the lumbar spine. No blastic or lytic bone lesions are evident. No intramuscular lesions are appreciable. IMPRESSION: 1. Small bowel obstruction with transition zone at the mid ileal level. No free air or portal venous air. No demonstrable abscess. No periappendiceal region inflammation. 2. Double-J stent on the left extending from the left renal pelvis to the bladder. Slight hydronephrosis present on the left. 3. Staghorn calculus lower pole right kidney measuring 2.7 x 1.7 cm. Calculus in mid left kidney measuring 0.8 x 0.6 cm. Multiple prostatic calculi noted. 4. Loculated ascites in the pelvis. This ascites may be secondary to the bowel obstruction. 5. Thickening of the wall of the distal esophagus is likely indicative of distal esophagitis. 6.  Aortic Atherosclerosis (ICD10-I70.0). 7.  Absent gallbladder. 8.  Calcified splenic granulomas. Electronically Signed: By: Lowella Grip III M.D. On: 07/04/2019 10:14   DG Abd Portable 1 View  Result Date: 07/04/2019 CLINICAL DATA:  63 year old male NG tube placement. EXAM: PORTABLE ABDOMEN - 1 VIEW COMPARISON:  CT Abdomen and Pelvis 0958 hours today. FINDINGS: Portable AP upright view at 1148 hours. Enteric tube placed into the stomach. Tip and side hole at the  level of the gastric fundus. Gastric distension may be slightly decreased from the CT earlier today. Dilated small bowel loops persist. No free air identified. Stable lung bases. Left ureteral stent partially visible. IMPRESSION: 1. Enteric tube placed into the stomach, tip and side hole at the level of the gastric fundus. 2. Gastric distension may be slightly decreased since the CT earlier today. Electronically Signed   By: Genevie Ann M.D.   On: 07/04/2019 12:08    EKG: Independently reviewed.  Sinus rhythm with multiple PVCs and T wave inversion in inferolateral leads.  No changes on prior EKG.  Assessment/Plan Active Problems:   *  No active hospital problems. *   Small bowel obstruction.  Surgery was consulted-appreciate their recommendations.  They are recommending conservative management for now with NG tube, gut rest and IV fluids. We will hold Plavix in case he needs to go to the OR. -Continue with NG tube. -LR -Zofran as needed -Morphine as needed for pain.  CHF.  Per patient he has an ejection fraction of 25%.  No documentation. Does not appear volume overload. Discussed with his PCP Dr. Humphrey Rolls, patient has an echo in their office on 05/11/2019 and his ejection fraction was 31%. -We will be careful with IV fluids. -Continue spironolactone. -Patient takes Lasix as needed-can give Lasix if needed.  Hypertension.  Mildly elevated blood pressure. -Continue home meds with sip of water. -Hydralazine IV as needed if unable to take p.o. meds.  Hyperlipidemia. -Continue statin if able to take p.o.  Nephrolithiasis.  Patient has an recent procedure with urology last month due to a staghorn stone and hydronephrosis.  Some stone burden was removed and a stent was placed with a plan for a future procedure. Microscopic hematuria may be due to his stent and nephrolithiasis. -Cultures pending.  DVT prophylaxis: Lovenox Code Status: Full code Family Communication:  Disposition Plan: Pending  improvement Consults called: Surgery no family at bedside Admission status: Inpatient   Lorella Nimrod MD [facility] Pager [phone]  If 7PM-7AM, please contact night-coverage [email or web address] Password [facility]  07/04/2019, 12:18 PM   This record has been created using Systems analyst. Errors have been sought and corrected,but may not always be located. Such creation errors do not reflect on the standard of care.

## 2019-07-04 NOTE — ED Triage Notes (Signed)
Pt reports hx of diverticulitis and states that the pain he has feels like it. Pt c/o abd pain, crampy and stabbing and some NV.

## 2019-07-04 NOTE — ED Notes (Signed)
ED TO INPATIENT HANDOFF REPORT  ED Nurse Name and Phone #: Willene Hatchet Name/Age/Gender [name] 63 y.o. male Room/Bed: ED05A/ED05A  Code Status   Code Status: Full Code  Home/SNF/Other Home Patient oriented to: self, place, time and situation Is this baseline? No   Triage Complete: Triage complete  Chief Complaint SBO (small bowel obstruction) ([place]) [K56.609]  Triage Note Pt presents to ED via wheelchair from [facility] with c/o lower abdominal pain, per [facility] RN pt with hx of diverticulitis at this time. Pt appears uncomfortable upon arrival to ED.   Pt reports hx of diverticulitis and states that the pain he has feels like it. Pt c/o abd pain, crampy and stabbing and some NV.     Allergies Allergies  Allergen Reactions  . Codeine Itching  . Dilaudid [Hydromorphone] Other (See Comments)    Severe headache    Level of Care/Admitting Diagnosis ED Disposition    ED Disposition Condition [place]: [place] [[id number]]  Level of Care: Med-Surg [16]  Covid Evaluation: Asymptomatic Screening Protocol (No Symptoms)  Diagnosis: SBO (small bowel obstruction) [facility]) [id number]  Admitting Physician: Lorella Nimrod [id number]  Attending Physician: Lorella Nimrod [phone]  Estimated length of stay: past midnight tomorrow  Certification:: I certify this patient will need inpatient services for at least 2 midnights       B Medical/Surgery History Past Medical History:  Diagnosis Date  . Arthritis    knees, lower back  . Bronchitis   . Cardiomyopathy ([place])   . CHF (congestive heart failure) ([place])   . Chronic back pain   . Complication of anesthesia    has trouble keeping pt asleep due to taking narcotics qid  . Coronary artery disease   . Diverticulitis   . Hepatitis C virus infection without hepatic coma    received treatment. clear now  . History of kidney stones   . Hypertension    Past Surgical History:  Procedure Laterality  Date  . BACK SURGERY    . CARDIAC CATHETERIZATION    . CHOLECYSTECTOMY    . COLON RESECTION  2007   due to diverticulitis  . COLONOSCOPY WITH PROPOFOL N/A 03/29/2017   Procedure: COLONOSCOPY WITH PROPOFOL;  Surgeon: Lucilla Lame, MD;  Location: [facility] ENDOSCOPY;  Service: Endoscopy;  Laterality: N/A;  . ESOPHAGOGASTRODUODENOSCOPY (EGD) WITH PROPOFOL N/A 03/29/2017   Procedure: ESOPHAGOGASTRODUODENOSCOPY (EGD) WITH PROPOFOL;  Surgeon: Lucilla Lame, MD;  Location: [facility] ENDOSCOPY;  Service: Endoscopy;  Laterality: N/A;  . FINGER SURGERY  2007   cut finger off with saw and was reattached  . HERNIA REPAIR    . HOLMIUM LASER APPLICATION N/A [id number]   Procedure: HOLMIUM LASER APPLICATION;  Surgeon: Hollice Espy, MD;  Location: [facility] ORS;  Service: Urology;  Laterality: N/A;  . IR NEPHROSTOMY PLACEMENT LEFT  05/28/2019  . KNEE ARTHROSCOPY Bilateral   . LOWER EXTREMITY ANGIOGRAPHY Right 11/09/2017   Procedure: LOWER EXTREMITY ANGIOGRAPHY;  Surgeon: Katha Cabal, MD;  Location: [place] CV LAB;  Service: Cardiovascular;  Laterality: Right;  . LOWER EXTREMITY ANGIOGRAPHY Right 11/21/2018   Procedure: LOWER EXTREMITY ANGIOGRAPHY;  Surgeon: Katha Cabal, MD;  Location: [place] CV LAB;  Service: Cardiovascular;  Laterality: Right;  . NEPHROLITHOTOMY Left 05/28/2019   Procedure: NEPHROLITHOTOMY PERCUTANEOUS;  Surgeon: Hollice Espy, MD;  Location: [facility] ORS;  Service: Urology;  Laterality: Left;  . SPINAL FUSION       A IV Location/Drains/Wounds Patient Lines/Drains/Airways Status  Active Line/Drains/Airways    Name:   Placement date:   Placement time:   Site:   Days:   Peripheral IV 07/04/19 Right Forearm   07/04/19    0906    Forearm   less than 1   NG/OG Tube Nasogastric 16 Fr. Left nare Xray;Aucultation Documented cm marking at nare/ corner of mouth 57 cm   07/04/19    1135    Left nare   less than 1   Ureteral Drain/Stent Left ureter 6 Fr.   05/28/19    1318    Left  ureter   37   Incision (Closed) 05/28/19 Back Left   05/28/19    1152     37          Intake/Output Last 24 hours  Intake/Output Summary (Last 24 hours) at 07/04/2019 2005 Last data filed at 07/04/2019 1317 Gross per 24 hour  Intake 1003 ml  Output 1050 ml  Net -47 ml    Labs/Imaging Results for orders placed or performed during the hospital encounter of 07/04/19 (from the past 48 hour(s))  Lipase, blood     Status: None   Collection Time: 07/04/19  9:10 AM  Result Value Ref Range   Lipase 19 11 - 51 U/L    Comment: Performed at [facility], [address] [place], [place] [address]  Comprehensive metabolic panel     Status: Abnormal   Collection Time: 07/04/19  9:10 AM  Result Value Ref Range   Sodium 131 (L) 135 - 145 mmol/L   Potassium 5.0 3.5 - 5.1 mmol/L   Chloride 92 (L) 98 - 111 mmol/L   CO2 27 22 - 32 mmol/L   Glucose, Bld 152 (H) 70 - 99 mg/dL   BUN 43 (H) 8 - 23 mg/dL   Creatinine, Ser 1.99 (H) 0.61 - 1.24 mg/dL   Calcium 10.0 8.9 - 10.3 mg/dL   Total Protein 8.0 6.5 - 8.1 g/dL   Albumin 4.4 3.5 - 5.0 g/dL   AST 25 15 - 41 U/L   ALT 12 0 - 44 U/L   Alkaline Phosphatase 69 38 - 126 U/L   Total Bilirubin 1.2 0.3 - 1.2 mg/dL   GFR calc non Af Amer 35 (L) >60 mL/min   GFR calc Af Amer 41 (L) >60 mL/min   Anion gap 12 5 - 15    Comment: Performed at [facility], [address] [place], [place] [address]  CBC     Status: Abnormal   Collection Time: 07/04/19  9:10 AM  Result Value Ref Range   WBC 16.8 (H) 4.0 - 10.5 K/uL   RBC 5.12 4.22 - 5.81 MIL/uL   Hemoglobin 13.5 13.0 - 17.0 g/dL   HCT 39.7 39.0 - 52.0 %   MCV 77.5 (L) 80.0 - 100.0 fL   MCH 26.4 26.0 - 34.0 pg   MCHC 34.0 30.0 - 36.0 g/dL   RDW 15.4 11.5 - 15.5 %   Platelets 255 150 - 400 K/uL   nRBC 0.0 0.0 - 0.2 %    Comment: Performed at [facility], [address] [place], [place] [address]  Urinalysis, Complete w Microscopic     Status: Abnormal   Collection  Time: 07/04/19  9:10 AM  Result Value Ref Range   Color, Urine YELLOW (A) YELLOW   APPearance CLEAR (A) CLEAR   Specific Gravity, Urine 1.010 1.005 - 1.030   pH 6.0 5.0 - 8.0   Glucose,  UA NEGATIVE NEGATIVE mg/dL   Hgb urine dipstick MODERATE (A) NEGATIVE   Bilirubin Urine NEGATIVE NEGATIVE   Ketones, ur NEGATIVE NEGATIVE mg/dL   Protein, ur 30 (A) NEGATIVE mg/dL   Nitrite NEGATIVE NEGATIVE   Leukocytes,Ua SMALL (A) NEGATIVE   RBC / HPF 21-50 0 - 5 RBC/hpf   WBC, UA 6-10 0 - 5 WBC/hpf   Bacteria, UA NONE SEEN NONE SEEN   Squamous Epithelial / LPF 0-5 0 - 5    Comment: Performed at [facility], [address], [place], [place] [address]  Respiratory Panel by RT PCR (Flu A&B, Covid) - Nasopharyngeal Swab     Status: None   Collection Time: 07/04/19 11:35 AM   Specimen: Nasopharyngeal Swab  Result Value Ref Range   SARS Coronavirus 2 by RT PCR NEGATIVE NEGATIVE    Comment: (NOTE) SARS-CoV-2 target nucleic acids are NOT DETECTED. The SARS-CoV-2 RNA is generally detectable in upper respiratoy specimens during the acute phase of infection. The lowest concentration of SARS-CoV-2 viral copies this assay can detect is 131 copies/mL. A negative result does not preclude SARS-Cov-2 infection and should not be used as the sole basis for treatment or other patient management decisions. A negative result may occur with  improper specimen collection/handling, submission of specimen other than nasopharyngeal swab, presence of viral mutation(s) within the areas targeted by this assay, and inadequate number of viral copies (<131 copies/mL). A negative result must be combined with clinical observations, patient history, and epidemiological information. The expected result is Negative. Fact Sheet for Patients:  PinkCheek.be Fact Sheet for Healthcare Providers:  GravelBags.it This test is not yet ap proved or cleared by the [place] FDA and  has been authorized for detection and/or diagnosis of SARS-CoV-2 by FDA under an Emergency Use Authorization (EUA). This EUA will remain  in effect (meaning this test can be used) for the duration of the COVID-19 declaration under Section 564(b)(1) of the Act, 21 U.S.C. section 360bbb-3(b)(1), unless the authorization is terminated or revoked sooner.    Influenza A by PCR NEGATIVE NEGATIVE   Influenza B by PCR NEGATIVE NEGATIVE    Comment: (NOTE) The Xpert Xpress SARS-CoV-2/FLU/RSV assay is intended as an aid in  the diagnosis of influenza from Nasopharyngeal swab specimens and  should not be used as a sole basis for treatment. Nasal washings and  aspirates are unacceptable for Xpert Xpress SARS-CoV-2/FLU/RSV  testing. Fact Sheet for Patients: PinkCheek.be Fact Sheet for Healthcare Providers: GravelBags.it This test is not yet approved or cleared by the [place] FDA and  has been authorized for detection and/or diagnosis of SARS-CoV-2 by  FDA under an Emergency Use Authorization (EUA). This EUA will remain  in effect (meaning this test can be used) for the duration of the  Covid-19 declaration under Section 564(b)(1) of the Act, 21  U.S.C. section 360bbb-3(b)(1), unless the authorization is  terminated or revoked. Performed at [facility], [address] [place], [place] [address]    CT ABDOMEN PELVIS W CONTRAST  Addendum Date: 07/04/2019   ADDENDUM REPORT: 07/04/2019 11:04 ADDENDUM: Note that there is asymmetric anterior urinary bladder wall thickening. Suspect inflammation in this area, potentially at least in part secondary to the presence of the double-J stent on the left. Electronically Signed   By: Lowella Grip III M.D.   On: 07/04/2019 11:04   Result Date: 07/04/2019 CLINICAL DATA:  Diffuse abdominal pain with nausea and vomiting EXAM: CT ABDOMEN AND PELVIS WITH CONTRAST  TECHNIQUE:  Multidetector CT imaging of the abdomen and pelvis was performed using the standard protocol following bolus administration of intravenous contrast. CONTRAST:  44mL OMNIPAQUE IOHEXOL 300 MG/ML  SOLN COMPARISON:  March 22, 2014 FINDINGS: Lower chest: There is slight atelectasis in the lung bases. Lung bases otherwise are clear. There is thickening of the distal esophageal wall. Hepatobiliary: No focal liver lesions are appreciable. Gallbladder is absent. There is no biliary duct dilatation. Pancreas: There is no pancreatic mass or inflammatory focus. Spleen: There are calcified granulomas throughout the spleen. Spleen otherwise appears unremarkable. Adrenals/Urinary Tract: Adrenals appear normal bilaterally. A double-J stent is seen on the left extending from the renal pelvis to the urinary bladder on the left. There is minimal fullness of the left renal collecting system. There is no hydronephrosis on the left. On the right, there is a cyst arising from the posterior upper to midportion of the kidney measuring 3.0 x 2.9 cm there are subcentimeter cysts in the mid to lower poles of the left kidney, largest measuring just under 1 cm. There is a staghorn calculus in the lower pole of the right kidney measuring 2.7 x 1.7 cm. There is a calculus in the mid left kidney measuring 0.8 x 0.6 cm. There is no evident ureteral calculus on either side. Urinary bladder is midline with wall thickness within normal limits. Stomach/Bowel: Stomach is distended with fluid and air. The stomach wall is not thickened. There are multiple loops of dilated small bowel with a transition zone in the mid ileal region consistent with small bowel obstruction. There is no evident free air or portal venous air. A portion of colon has been removed with apparent patent anastomosis. There is moderate stool in colon currently. The terminal ileal region appears unremarkable. Vascular/Lymphatic: There is no abdominal aortic aneurysm.  There is aortic and iliac artery atherosclerotic calcification. No adenopathy is appreciable in the abdomen or pelvis. Reproductive: Prostate contains multiple calculi. Prostate and seminal vesicles are normal in size and contour. There is no evident pelvic mass. Other: Appendix appears normal. There is mild partially loculated ascites in the pelvis. No abscess is evident in the abdomen or pelvis. There is scarring in the region of the umbilicus. Musculoskeletal: Postoperative changes noted at L5-S1. There is arthropathy in the lumbar spine. No blastic or lytic bone lesions are evident. No intramuscular lesions are appreciable. IMPRESSION: 1. Small bowel obstruction with transition zone at the mid ileal level. No free air or portal venous air. No demonstrable abscess. No periappendiceal region inflammation. 2. Double-J stent on the left extending from the left renal pelvis to the bladder. Slight hydronephrosis present on the left. 3. Staghorn calculus lower pole right kidney measuring 2.7 x 1.7 cm. Calculus in mid left kidney measuring 0.8 x 0.6 cm. Multiple prostatic calculi noted. 4. Loculated ascites in the pelvis. This ascites may be secondary to the bowel obstruction. 5. Thickening of the wall of the distal esophagus is likely indicative of distal esophagitis. 6.  Aortic Atherosclerosis (ICD10-I70.0). 7.  Absent gallbladder. 8.  Calcified splenic granulomas. Electronically Signed: By: Lowella Grip III M.D. On: 07/04/2019 10:14   DG Abd Portable 1 View  Result Date: 07/04/2019 CLINICAL DATA:  63 year old male NG tube placement. EXAM: PORTABLE ABDOMEN - 1 VIEW COMPARISON:  CT Abdomen and Pelvis 0958 hours today. FINDINGS: Portable AP upright view at 1148 hours. Enteric tube placed into the stomach. Tip and side hole at the level of the gastric fundus. Gastric distension may be slightly decreased from the  CT earlier today. Dilated small bowel loops persist. No free air identified. Stable lung bases. Left  ureteral stent partially visible. IMPRESSION: 1. Enteric tube placed into the stomach, tip and side hole at the level of the gastric fundus. 2. Gastric distension may be slightly decreased since the CT earlier today. Electronically Signed   By: Genevie Ann M.D.   On: 07/04/2019 12:08    Pending Labs Unresulted Labs (From admission, onward)    Start     Ordered   07/11/19 0500  Creatinine, serum  (enoxaparin (LOVENOX)    CrCl >/= 30 ml/min)  Weekly,   STAT    Comments: while on enoxaparin therapy    07/04/19 1440   07/05/19 0500  CBC  Tomorrow morning,   STAT     07/04/19 1440   07/05/19 0500  Comprehensive metabolic panel  Tomorrow morning,   STAT     07/04/19 1440   07/04/19 1437  HIV Antibody (routine testing w rflx)  (HIV Antibody (Routine testing w reflex) panel)  Once,   STAT     07/04/19 1440   07/04/19 1437  CBC  (enoxaparin (LOVENOX)    CrCl >/= 30 ml/min)  Once,   STAT    Comments: Baseline for enoxaparin therapy IF NOT ALREADY DRAWN.  Notify MD if PLT < 100 K.    07/04/19 1440   07/04/19 1437  Creatinine, serum  (enoxaparin (LOVENOX)    CrCl >/= 30 ml/min)  Once,   STAT    Comments: Baseline for enoxaparin therapy IF NOT ALREADY DRAWN.    07/04/19 1440   07/04/19 1125  Urine Culture  Add-on,   AD     07/04/19 1124          Vitals/Pain Today's Vitals   07/04/19 1630 07/04/19 1700 07/04/19 1730 07/04/19 1839  BP: 133/72 140/67 128/65   Pulse:   83   Resp: 13 16 15    SpO2:   95%   Weight:      Height:      PainSc:    8     Isolation Precautions No active isolations  Medications Medications  enoxaparin (LOVENOX) injection 40 mg (has no administration in time range)  sodium chloride flush (NS) 0.9 % injection 3 mL (3 mLs Intravenous Given 07/04/19 1800)  lactated ringers infusion ( Intravenous New Bag/Given 07/04/19 1759)  acetaminophen (TYLENOL) tablet 650 mg (has no administration in time range)    Or  acetaminophen (TYLENOL) suppository 650 mg (has no  administration in time range)  ondansetron (ZOFRAN) tablet 4 mg (has no administration in time range)    Or  ondansetron (ZOFRAN) injection 4 mg (has no administration in time range)  ALPRAZolam (XANAX) tablet 1 mg (has no administration in time range)  carvedilol (COREG) tablet 25 mg (has no administration in time range)  fluticasone (FLONASE) 50 MCG/ACT nasal spray 1 spray (has no administration in time range)  losartan (COZAAR) tablet 50 mg (has no administration in time range)  spironolactone (ALDACTONE) tablet 25 mg (has no administration in time range)  nicotine (NICODERM CQ - dosed in mg/24 hours) patch 14 mg (0 mg Transdermal Hold 07/04/19 1854)  morphine 2 MG/ML injection 2 mg (2 mg Intravenous Given 07/04/19 1850)  hydrALAZINE (APRESOLINE) injection 10 mg (has no administration in time range)  sodium chloride flush (NS) 0.9 % injection 3 mL (3 mLs Intravenous Given 07/04/19 1014)  morphine 4 MG/ML injection 4 mg (4 mg Intravenous Given 07/04/19 0907)  ondansetron (ZOFRAN)  injection 4 mg (4 mg Intravenous Given 07/04/19 0907)  0.9 %  sodium chloride infusion ( Intravenous Stopped 07/04/19 1317)  iohexol (OMNIPAQUE) 300 MG/ML solution 75 mL (75 mLs Intravenous Contrast Given 07/04/19 0955)  morphine 4 MG/ML injection 4 mg (4 mg Intravenous Given 07/04/19 1123)    Mobility walks Low fall risk   Focused Assessments GI   R Recommendations: See Admitting Provider Note  Report given to:   Additional Notes:

## 2019-07-04 NOTE — ED Triage Notes (Signed)
Pt presents to ED via wheelchair from [facility] with c/o lower abdominal pain, per [facility] RN pt with hx of diverticulitis at this time. Pt appears uncomfortable upon arrival to ED.

## 2019-07-05 ENCOUNTER — Inpatient Hospital Stay: Payer: Medicare Other

## 2019-07-05 DIAGNOSIS — I2510 Atherosclerotic heart disease of native coronary artery without angina pectoris: Secondary | ICD-10-CM

## 2019-07-05 LAB — CBC
HCT: 37.2 % — ABNORMAL LOW (ref 39.0–52.0)
Hemoglobin: 12 g/dL — ABNORMAL LOW (ref 13.0–17.0)
MCH: 26.4 pg (ref 26.0–34.0)
MCHC: 32.3 g/dL (ref 30.0–36.0)
MCV: 81.8 fL (ref 80.0–100.0)
Platelets: 228 10*3/uL (ref 150–400)
RBC: 4.55 MIL/uL (ref 4.22–5.81)
RDW: 15.5 % (ref 11.5–15.5)
WBC: 14.2 10*3/uL — ABNORMAL HIGH (ref 4.0–10.5)
nRBC: 0 % (ref 0.0–0.2)

## 2019-07-05 LAB — COMPREHENSIVE METABOLIC PANEL
ALT: 9 U/L (ref 0–44)
AST: 15 U/L (ref 15–41)
Albumin: 3.6 g/dL (ref 3.5–5.0)
Alkaline Phosphatase: 60 U/L (ref 38–126)
Anion gap: 9 (ref 5–15)
BUN: 39 mg/dL — ABNORMAL HIGH (ref 8–23)
CO2: 29 mmol/L (ref 22–32)
Calcium: 9.1 mg/dL (ref 8.9–10.3)
Chloride: 99 mmol/L (ref 98–111)
Creatinine, Ser: 2.01 mg/dL — ABNORMAL HIGH (ref 0.61–1.24)
GFR calc Af Amer: 40 mL/min — ABNORMAL LOW (ref >60)
GFR calc non Af Amer: 35 mL/min — ABNORMAL LOW (ref >60)
Glucose, Bld: 115 mg/dL — ABNORMAL HIGH (ref 70–99)
Potassium: 4.8 mmol/L (ref 3.5–5.1)
Sodium: 137 mmol/L (ref 135–145)
Total Bilirubin: 1.2 mg/dL (ref 0.3–1.2)
Total Protein: 6.8 g/dL (ref 6.5–8.1)

## 2019-07-05 LAB — HIV ANTIBODY (ROUTINE TESTING W REFLEX): HIV Screen 4th Generation wRfx: NONREACTIVE

## 2019-07-05 LAB — GLUCOSE, CAPILLARY: Glucose-Capillary: 108 mg/dL — ABNORMAL HIGH (ref 70–99)

## 2019-07-05 MED ORDER — IPRATROPIUM-ALBUTEROL 0.5-2.5 (3) MG/3ML IN SOLN: 3.0000 mL | RESPIRATORY_TRACT | Status: DC | PRN

## 2019-07-05 MED ORDER — ALBUTEROL SULFATE (2.5 MG/3ML) 0.083% IN NEBU: 2.5000 mg | INHALATION_SOLUTION | Freq: Four times a day (QID) | RESPIRATORY_TRACT | Status: DC | PRN

## 2019-07-05 MED ORDER — DIATRIZOATE MEGLUMINE & SODIUM 66-10 % PO SOLN
90.0000 mL | Freq: Once | ORAL | Status: AC
  Administered 2019-07-05: 90 mL via NASOGASTRIC

## 2019-07-05 MED ORDER — HYDRALAZINE HCL 20 MG/ML IJ SOLN: 10.0000 mg | INTRAMUSCULAR | Status: DC | PRN

## 2019-07-05 MED ORDER — POLYETHYLENE GLYCOL 3350 17 G PO PACK: 17.0000 g | PACK | Freq: Every day | ORAL | Status: DC | PRN

## 2019-07-05 MED ORDER — ALBUTEROL SULFATE HFA 108 (90 BASE) MCG/ACT IN AERS: 2.0000 | INHALATION_SPRAY | Freq: Four times a day (QID) | RESPIRATORY_TRACT | Status: DC | PRN

## 2019-07-05 MED ORDER — ROSUVASTATIN CALCIUM 20 MG PO TABS
40.0000 mg | ORAL_TABLET | Freq: Every evening | ORAL | Status: DC
  Administered 2019-07-05 – 2019-07-06 (×2): 40 mg via ORAL
  Filled 2019-07-05: qty 2
  Filled 2019-07-05: qty 4
  Filled 2019-07-05: qty 2
  Filled 2019-07-05: qty 4
  Filled 2019-07-05: qty 2

## 2019-07-05 MED ORDER — SENNOSIDES-DOCUSATE SODIUM 8.6-50 MG PO TABS: 2.0000 | ORAL_TABLET | Freq: Every evening | ORAL | Status: DC | PRN

## 2019-07-05 NOTE — Progress Notes (Signed)
Patient refused IS and Flutter instruct.  Left at bedside.

## 2019-07-05 NOTE — Progress Notes (Signed)
[place] Hospital Day(s): 1.   Post op day(s):  Marland Kitchen   Interval History: Patient seen and examined, no acute events or new complaints overnight. Patient reports the abdominal pain has been improving.  There is no alleviating or aggravating factor.  Patient denies passing gas.  Abdominal area done this morning shows improved dilation of the small intestine with air in the colon.  I personally evaluated the abdominal x-ray.  Vital signs in last 24 hours: [min-max] current  Temp:  [97.8 F (36.6 C)-98.5 F (36.9 C)] 97.8 F (36.6 C) (12/24 0618) Pulse Rate:  [79-90] 81 (12/24 0922) Resp:  [11-24] 11 (12/24 0922) BP: (105-144)/(65-84) 128/69 (12/24 0922) SpO2:  [94 %-97 %] 94 % (12/24 0618)     Height: 5\' 7"  (170.2 cm) Weight: 65.8 kg BMI (Calculated): 22.71   NGT: 1450 mL since placement  Physical Exam:  Constitutional: alert, cooperative and no distress  Respiratory: breathing non-labored at rest  Cardiovascular: regular rate and sinus rhythm  Gastrointestinal: soft, non-tender, and mild-distended  Labs:  CBC Latest Ref Rng & Units 07/05/2019 07/04/2019 05/29/2019  WBC 4.0 - 10.5 K/uL 14.2(H) 16.8(H) 13.5(H)  Hemoglobin 13.0 - 17.0 g/dL 12.0(L) 13.5 11.1(L)  Hematocrit 39.0 - 52.0 % 37.2(L) 39.7 32.8(L)  Platelets 150 - 400 K/uL 228 255 229   CMP Latest Ref Rng & Units 07/05/2019 07/04/2019 05/29/2019  Glucose 70 - 99 mg/dL 115(H) 152(H) 188(H)  BUN 8 - 23 mg/dL 39(H) 43(H) 39(H)  Creatinine 0.61 - 1.24 mg/dL 2.01(H) 1.99(H) 1.81(H)  Sodium 135 - 145 mmol/L 137 131(L) 134(L)  Potassium 3.5 - 5.1 mmol/L 4.8 5.0 4.9  Chloride 98 - 111 mmol/L 99 92(L) 105  CO2 22 - 32 mmol/L 29 27 22   Calcium 8.9 - 10.3 mg/dL 9.1 10.0 8.2(L)  Total Protein 6.5 - 8.1 g/dL 6.8 8.0 -  Total Bilirubin 0.3 - 1.2 mg/dL 1.2 1.2 -  Alkaline Phos 38 - 126 U/L 60 69 -  AST 15 - 41 U/L 15 25 -  ALT 0 - 44 U/L 9 12 -    Imaging studies: Abdominal x-ray today shows mild improvement  of the small bowel dilation.  There is gas in the large intestine.  Still with concern of persistent small bowel but improving.   Assessment/Plan:  63 y.o. male with small bowel obstruction, complicated by pertinent comorbidities including congestive heart failure, hypertension, hyperlipidemia, nephrolithiasis with stent placement. Patient still without passing gas or bowel movement.  X-ray showed improvement of the small bowel dilation.  Even though the x-ray looks better clinically he has not passed gas.  I will order Gastrografin follow-through for more objective assessment of bowel obstruction progression.  Clinically the patient with improved white blood cell count, no significant electrolyte disturbance.  We will continue with conservative management of small bowel obstruction.  I will follow closely with physical exam and images.  Appreciate hospitalist management of medical conditions.  Arnold Long, MD

## 2019-07-05 NOTE — Progress Notes (Signed)
Initial Nutrition Assessment  DOCUMENTATION CODES:   Not applicable  INTERVENTION:   RD will monitor for diet advancement vs the need for nutrition support  NUTRITION DIAGNOSIS:   Inadequate oral intake related to acute illness(SBO) as evidenced by NPO status.  GOAL:   Patient will meet greater than or equal to 90% of their needs  MONITOR:   Diet advancement, Labs, Weight trends, Skin, I & O's  REASON FOR ASSESSMENT:   Malnutrition Screening Tool    ASSESSMENT:   63 y.o. male with small bowel obstruction, complicated by pertinent comorbidities including h/o diverticulitis, Hep C, congestive heart failure, hypertension, hyperlipidemia, nephrolithiasis with stent placement.   Pt reports poor appetite and oral intake for 1 day pta r/t nausea, vomiting and abdominal pain. Pt currently NPO. NGT in place with 480ml output. Pt reports improved abdominal pain today but still no flatus or BM. Plan is for gastrografin follow-through per MD note. RD will monitor for diet advancement vs the need for nutrition support.   Per chart, pt appears fairly weight stable pta.   Medications reviewed and include: lovenox, nicotine, LRS @75ml /hr  Labs reviewed: BUN 39(H), creat 2.01(H) Wbc- 14.2(H)  Unable to complete Nutrition-Focused physical exam at this time.   Diet Order:   Diet Order            Diet NPO time specified  Diet effective now             EDUCATION NEEDS:   No education needs have been identified at this time  Skin:  Skin Assessment: Reviewed RN Assessment  Last BM:  12/22  Height:   Ht Readings from Last 1 Encounters:  07/04/19 5\' 7"  (1.702 m)    Weight:   Wt Readings from Last 1 Encounters:  07/04/19 65.8 kg    Ideal Body Weight:  67.2 kg  BMI:  Body mass index is 22.71 kg/m.  Estimated Nutritional Needs:   Kcal:  1700-2000kcal/day  Protein:  85-100g/day  Fluid:  1.7L/day  Koleen Distance MS, RD, LDN Pager #- [phone] Office#-  [phone] After Hours Pager: [phone]

## 2019-07-05 NOTE — Progress Notes (Signed)
Patient complaining of sharp abdominal pain. NG output was 400 mL. Given morphine 2mg  prn for pain.  Will continue to monitor.  [name] 07/05/2019  6:36 AM

## 2019-07-05 NOTE — Progress Notes (Signed)
PROGRESS NOTE    [name]  [id number] DOB: August 21, 1955 DOA: 07/04/2019 PCP: Perrin Maltese, MD   Brief Narrative:  63 year old with history of PAD, COPD, CAD, HTN, cardiomyopathy presented to the ER with complaints of abdominal pain which was cramping and stabbing in nature along with nausea and vomiting.  Upon admission diagnosis small bowel obstruction, NG tube was inserted and general surgery was consulted.   Assessment & Plan:   Principal Problem:   SBO (small bowel obstruction) (HCC) Active Problems:   Atherosclerosis of native arteries of extremity with intermittent claudication (HCC)   Hyperlipidemia   DJD (degenerative joint disease)   Essential hypertension   COPD (chronic obstructive pulmonary disease) (HCC)   CAD (coronary artery disease)  Intractable nausea vomiting abdominal pain Small bowel obstruction -Conservative management with NG tube, bowel rest, n.p.o. and IV fluid -Antiemetics.  Plavix on hold, other p.o. meds as tolerated -General surgery following. -LFTs, lipase normal  Chronic congestive heart failure with reduced ejection fraction, 25% -Admitting provider spoke with patient's PCP, ejection fraction 30%.  Will hydrate gently while n.p.o. -Lasix and Aldactone on hold.  Will continue Coreg, losartan as tolerated.  Essential hypertension -Coreg, losartan  Hyperlipidemia -Crestor  CKD stage IIIb -Creatinine around baseline of 1.9.  History of renal stones -Has been to urology multiple times outpatient for stent placement and removal.  Continue to follow them outpatient    DVT prophylaxis: Lovenox Code Status: Full Family Communication: None at bedside Disposition Plan: Maintain hospital stay until return of bowel function  Consultants:   General surgery   Subjective: Passing gas and abdomen feels slightly softer today.  Review of Systems Otherwise negative except as per HPI, including: General: Denies fever, chills, night  sweats or unintended weight loss. Resp: Denies cough, wheezing, shortness of breath. Cardiac: Denies chest pain, palpitations, orthopnea, paroxysmal nocturnal dyspnea. GI: Deniesdiarrhea or constipation GU: Denies dysuria, frequency, hesitancy or incontinence MS: Denies muscle aches, joint pain or swelling Neuro: Denies headache, neurologic deficits (focal weakness, numbness, tingling), abnormal gait Psych: Denies anxiety, depression, SI/HI/AVH Skin: Denies new rashes or lesions ID: Denies sick contacts, exotic exposures, travel  Objective: Vitals:   07/04/19 2030 07/04/19 2036 07/04/19 2054 07/05/19 0618  BP: 135/77  135/77 105/69  Pulse:   90 79  Resp: (!) 24  16 12   Temp:  98.5 F (36.9 C) 98.4 F (36.9 C) 97.8 F (36.6 C)  TempSrc:  Oral Oral Oral  SpO2:   96% 94%  Weight:      Height:        Intake/Output Summary (Last 24 hours) at 07/05/2019 0748 Last data filed at 07/05/2019 [id number] Gross per 24 hour  Intake 1791.65 ml  Output 1700 ml  Net 91.65 ml   Filed Weights   07/04/19 0847  Weight: 65.8 kg    Examination:  General exam: Appears calm and comfortable, NG tube in place Respiratory system: Clear to auscultation. Respiratory effort normal. Cardiovascular system: S1 & S2 heard, RRR. No JVD, murmurs, rubs, gallops or clicks. No pedal edema. Gastrointestinal system: Abdomen mildly distended, positive bowel sounds Central nervous system: Alert and oriented. No focal neurological deficits. Extremities: Symmetric 4 x 5 power. Skin: No rashes, lesions or ulcers Psychiatry: Judgement and insight appear normal. Mood & affect appropriate.     Data Reviewed:   CBC: Recent Labs  Lab 07/04/19 0910 07/05/19 0519  WBC 16.8* 14.2*  HGB 13.5 12.0*  HCT 39.7 37.2*  MCV 77.5* 81.8  PLT 255 228  Basic Metabolic Panel: Recent Labs  Lab 07/04/19 0910 07/05/19 0519  NA 131* 137  K 5.0 4.8  CL 92* 99  CO2 27 29  GLUCOSE 152* 115*  BUN 43* 39*  CREATININE  1.99* 2.01*  CALCIUM 10.0 9.1   GFR: Estimated Creatinine Clearance: 35.9 mL/min (A) (by C-G formula based on SCr of 2.01 mg/dL (H)). Liver Function Tests: Recent Labs  Lab 07/04/19 0910 07/05/19 0519  AST 25 15  ALT 12 9  ALKPHOS 69 60  BILITOT 1.2 1.2  PROT 8.0 6.8  ALBUMIN 4.4 3.6   Recent Labs  Lab 07/04/19 0910  LIPASE 19   No results for input(s): AMMONIA in the last 168 hours. Coagulation Profile: No results for input(s): INR, PROTIME in the last 168 hours. Cardiac Enzymes: No results for input(s): CKTOTAL, CKMB, CKMBINDEX, TROPONINI in the last 168 hours. BNP (last 3 results) No results for input(s): PROBNP in the last 8760 hours. HbA1C: No results for input(s): HGBA1C in the last 72 hours. CBG: No results for input(s): GLUCAP in the last 168 hours. Lipid Profile: No results for input(s): CHOL, HDL, LDLCALC, TRIG, CHOLHDL, LDLDIRECT in the last 72 hours. Thyroid Function Tests: No results for input(s): TSH, T4TOTAL, FREET4, T3FREE, THYROIDAB in the last 72 hours. Anemia Panel: No results for input(s): VITAMINB12, FOLATE, FERRITIN, TIBC, IRON, RETICCTPCT in the last 72 hours. Sepsis Labs: No results for input(s): PROCALCITON, LATICACIDVEN in the last 168 hours.  Recent Results (from the past 240 hour(s))  Respiratory Panel by RT PCR (Flu A&B, Covid) - Nasopharyngeal Swab     Status: None   Collection Time: 07/04/19 11:35 AM   Specimen: Nasopharyngeal Swab  Result Value Ref Range Status   SARS Coronavirus 2 by RT PCR NEGATIVE NEGATIVE Final    Comment: (NOTE) SARS-CoV-2 target nucleic acids are NOT DETECTED. The SARS-CoV-2 RNA is generally detectable in upper respiratoy specimens during the acute phase of infection. The lowest concentration of SARS-CoV-2 viral copies this assay can detect is 131 copies/mL. A negative result does not preclude SARS-Cov-2 infection and should not be used as the sole basis for treatment or other patient management decisions. A  negative result may occur with  improper specimen collection/handling, submission of specimen other than nasopharyngeal swab, presence of viral mutation(s) within the areas targeted by this assay, and inadequate number of viral copies (<131 copies/mL). A negative result must be combined with clinical observations, patient history, and epidemiological information. The expected result is Negative. Fact Sheet for Patients:  PinkCheek.be Fact Sheet for Healthcare Providers:  GravelBags.it This test is not yet ap proved or cleared by the [place] FDA and  has been authorized for detection and/or diagnosis of SARS-CoV-2 by FDA under an Emergency Use Authorization (EUA). This EUA will remain  in effect (meaning this test can be used) for the duration of the COVID-19 declaration under Section 564(b)(1) of the Act, 21 U.S.C. section 360bbb-3(b)(1), unless the authorization is terminated or revoked sooner.    Influenza A by PCR NEGATIVE NEGATIVE Final   Influenza B by PCR NEGATIVE NEGATIVE Final    Comment: (NOTE) The Xpert Xpress SARS-CoV-2/FLU/RSV assay is intended as an aid in  the diagnosis of influenza from Nasopharyngeal swab specimens and  should not be used as a sole basis for treatment. Nasal washings and  aspirates are unacceptable for Xpert Xpress SARS-CoV-2/FLU/RSV  testing. Fact Sheet for Patients: PinkCheek.be Fact Sheet for Healthcare Providers: GravelBags.it This test is not yet approved or cleared by the  [place] Architectural technologist and  has been authorized for detection and/or diagnosis of SARS-CoV-2 by  FDA under an Print production planner (EUA). This EUA will remain  in effect (meaning this test can be used) for the duration of the  Covid-19 declaration under Section 564(b)(1) of the Act, 21  U.S.C. section 360bbb-3(b)(1), unless the authorization is    terminated or revoked. Performed at [facility], [address] [place],  [address]          Radiology Studies: CT ABDOMEN PELVIS W CONTRAST  Addendum Date: 07/04/2019   ADDENDUM REPORT: 07/04/2019 11:04 ADDENDUM: Note that there is asymmetric anterior urinary bladder wall thickening. Suspect inflammation in this area, potentially at least in part secondary to the presence of the double-J stent on the left. Electronically Signed   By: Lowella Grip III M.D.   On: 07/04/2019 11:04   Result Date: 07/04/2019 CLINICAL DATA:  Diffuse abdominal pain with nausea and vomiting EXAM: CT ABDOMEN AND PELVIS WITH CONTRAST TECHNIQUE: Multidetector CT imaging of the abdomen and pelvis was performed using the standard protocol following bolus administration of intravenous contrast. CONTRAST:  61mL OMNIPAQUE IOHEXOL 300 MG/ML  SOLN COMPARISON:  March 22, 2014 FINDINGS: Lower chest: There is slight atelectasis in the lung bases. Lung bases otherwise are clear. There is thickening of the distal esophageal wall. Hepatobiliary: No focal liver lesions are appreciable. Gallbladder is absent. There is no biliary duct dilatation. Pancreas: There is no pancreatic mass or inflammatory focus. Spleen: There are calcified granulomas throughout the spleen. Spleen otherwise appears unremarkable. Adrenals/Urinary Tract: Adrenals appear normal bilaterally. A double-J stent is seen on the left extending from the renal pelvis to the urinary bladder on the left. There is minimal fullness of the left renal collecting system. There is no hydronephrosis on the left. On the right, there is a cyst arising from the posterior upper to midportion of the kidney measuring 3.0 x 2.9 cm there are subcentimeter cysts in the mid to lower poles of the left kidney, largest measuring just under 1 cm. There is a staghorn calculus in the lower pole of the right kidney measuring 2.7 x 1.7 cm. There is a calculus in the mid  left kidney measuring 0.8 x 0.6 cm. There is no evident ureteral calculus on either side. Urinary bladder is midline with wall thickness within normal limits. Stomach/Bowel: Stomach is distended with fluid and air. The stomach wall is not thickened. There are multiple loops of dilated small bowel with a transition zone in the mid ileal region consistent with small bowel obstruction. There is no evident free air or portal venous air. A portion of colon has been removed with apparent patent anastomosis. There is moderate stool in colon currently. The terminal ileal region appears unremarkable. Vascular/Lymphatic: There is no abdominal aortic aneurysm. There is aortic and iliac artery atherosclerotic calcification. No adenopathy is appreciable in the abdomen or pelvis. Reproductive: Prostate contains multiple calculi. Prostate and seminal vesicles are normal in size and contour. There is no evident pelvic mass. Other: Appendix appears normal. There is mild partially loculated ascites in the pelvis. No abscess is evident in the abdomen or pelvis. There is scarring in the region of the umbilicus. Musculoskeletal: Postoperative changes noted at L5-S1. There is arthropathy in the lumbar spine. No blastic or lytic bone lesions are evident. No intramuscular lesions are appreciable. IMPRESSION: 1. Small bowel obstruction with transition zone at the mid ileal level. No free air or portal venous air. No demonstrable abscess.  No periappendiceal region inflammation. 2. Double-J stent on the left extending from the left renal pelvis to the bladder. Slight hydronephrosis present on the left. 3. Staghorn calculus lower pole right kidney measuring 2.7 x 1.7 cm. Calculus in mid left kidney measuring 0.8 x 0.6 cm. Multiple prostatic calculi noted. 4. Loculated ascites in the pelvis. This ascites may be secondary to the bowel obstruction. 5. Thickening of the wall of the distal esophagus is likely indicative of distal esophagitis. 6.   Aortic Atherosclerosis (ICD10-I70.0). 7.  Absent gallbladder. 8.  Calcified splenic granulomas. Electronically Signed: By: Lowella Grip III M.D. On: 07/04/2019 10:14   DG Abd Portable 1 View  Result Date: 07/04/2019 CLINICAL DATA:  63 year old male NG tube placement. EXAM: PORTABLE ABDOMEN - 1 VIEW COMPARISON:  CT Abdomen and Pelvis 0958 hours today. FINDINGS: Portable AP upright view at 1148 hours. Enteric tube placed into the stomach. Tip and side hole at the level of the gastric fundus. Gastric distension may be slightly decreased from the CT earlier today. Dilated small bowel loops persist. No free air identified. Stable lung bases. Left ureteral stent partially visible. IMPRESSION: 1. Enteric tube placed into the stomach, tip and side hole at the level of the gastric fundus. 2. Gastric distension may be slightly decreased since the CT earlier today. Electronically Signed   By: Genevie Ann M.D.   On: 07/04/2019 12:08        Scheduled Meds: . ALPRAZolam  1 mg Oral BID  . carvedilol  25 mg Oral BID  . enoxaparin (LOVENOX) injection  40 mg Subcutaneous Q24H  . losartan  50 mg Oral BH-[id number]  . nicotine  14 mg Transdermal Daily  . sodium chloride flush  3 mL Intravenous Q12H  . spironolactone  25 mg Oral Daily   Continuous Infusions: . lactated ringers 75 mL/hr at 07/05/19 0430     LOS: 1 day   Time spent= 35 mins    Khristian Phillippi Arsenio Loader, MD [facility]  If 7PM-7AM, please contact night-coverage  07/05/2019, 7:48 AM

## 2019-07-06 DIAGNOSIS — K56609 Unspecified intestinal obstruction, unspecified as to partial versus complete obstruction: Secondary | ICD-10-CM

## 2019-07-06 LAB — BASIC METABOLIC PANEL
Anion gap: 9 (ref 5–15)
BUN: 35 mg/dL — ABNORMAL HIGH (ref 8–23)
CO2: 31 mmol/L (ref 22–32)
Calcium: 9.1 mg/dL (ref 8.9–10.3)
Chloride: 99 mmol/L (ref 98–111)
Creatinine, Ser: 2.03 mg/dL — ABNORMAL HIGH (ref 0.61–1.24)
GFR calc Af Amer: 40 mL/min — ABNORMAL LOW (ref >60)
GFR calc non Af Amer: 34 mL/min — ABNORMAL LOW (ref >60)
Glucose, Bld: 101 mg/dL — ABNORMAL HIGH (ref 70–99)
Potassium: 4.3 mmol/L (ref 3.5–5.1)
Sodium: 139 mmol/L (ref 135–145)

## 2019-07-06 LAB — CBC
HCT: 32.4 % — ABNORMAL LOW (ref 39.0–52.0)
Hemoglobin: 10.8 g/dL — ABNORMAL LOW (ref 13.0–17.0)
MCH: 26.7 pg (ref 26.0–34.0)
MCHC: 33.3 g/dL (ref 30.0–36.0)
MCV: 80 fL (ref 80.0–100.0)
Platelets: 191 10*3/uL (ref 150–400)
RBC: 4.05 MIL/uL — ABNORMAL LOW (ref 4.22–5.81)
RDW: 15.5 % (ref 11.5–15.5)
WBC: 10.5 10*3/uL (ref 4.0–10.5)
nRBC: 0 % (ref 0.0–0.2)

## 2019-07-06 LAB — MAGNESIUM: Magnesium: 2.2 mg/dL (ref 1.7–2.4)

## 2019-07-06 LAB — GLUCOSE, CAPILLARY: Glucose-Capillary: 82 mg/dL (ref 70–99)

## 2019-07-06 LAB — URINE CULTURE: Culture: <10000 — AB

## 2019-07-06 NOTE — Progress Notes (Signed)
CC: SBO Subjective: 63 year old male admitted with partial small bowel obstruction.  I have personally reviewed the CT scan and Gastrografin challenge.  There is evidence of contrast within the colon.  He reports having no pain and passing flatus.  White count has gone down. ngt 200cc  Objective: Vital signs in last 24 hours: Temp:  [97.6 F (36.4 C)-97.9 F (36.6 C)] 97.6 F (36.4 C) (12/25 0501) Pulse Rate:  [68-75] 68 (12/25 0501) Resp:  [14-20] 20 (12/25 0501) BP: (100-129)/(64-83) 114/64 (12/25 0501) SpO2:  [92 %-95 %] 92 % (12/25 0501) Last BM Date: 07/03/19  Intake/Output from previous day: 12/24 0701 - 12/25 0700 In: 50 [NG/GT:50] Out: 400 [Urine:200; Emesis/NG output:200] Intake/Output this shift: Total I/O In: 2217.7 [I.V.:2217.7] Out: -   Physical exam:  NAD , alert Abd: soft, decrease bs, non tender no peritonitis, mildly distended.  Lab Results: CBC  Recent Labs    07/05/19 0519 07/06/19 0406  WBC 14.2* 10.5  HGB 12.0* 10.8*  HCT 37.2* 32.4*  PLT 228 191   BMET Recent Labs    07/05/19 0519 07/06/19 0406  NA 137 139  K 4.8 4.3  CL 99 99  CO2 29 31  GLUCOSE 115* 101*  BUN 39* 35*  CREATININE 2.01* 2.03*  CALCIUM 9.1 9.1   PT/INR No results for input(s): LABPROT, INR in the last 72 hours. ABG No results for input(s): PHART, HCO3 in the last 72 hours.  Invalid input(s): PCO2, PO2  Studies/Results: DG Abd 1 View  Result Date: 07/05/2019 CLINICAL DATA:  Abdominal distention EXAM: ABDOMEN - 1 VIEW COMPARISON:  And 07/04/2019 FINDINGS: NG tube tip is in the proximal stomach with the side port in the distal esophagus. Left ureteral stent remains in place, unchanged. There are mildly prominent left abdominal small bowel loops, decreasing since prior study. No free air organomegaly. Prior cholecystectomy. IMPRESSION: NG tube tip is in the proximal stomach with the side port in the distal esophagus. This has retracted slightly since prior study.  Mildly prominent left abdominal small bowel loops, decreased in distention since prior study compatible with improving small bowel obstruction. Electronically Signed   By: Rolm Baptise M.D.   On: 07/05/2019 09:43   DG Abd Portable 1V-Small Bowel Obstruction Protocol-initial, 8 hr delay  Result Date: 07/05/2019 CLINICAL DATA:  Small-bowel obstruction, 8 hour delay EXAM: PORTABLE ABDOMEN - 1 VIEW COMPARISON:  Radiograph 07/05/2019 FINDINGS: There has been passage of enteric contrast media to the level of the rectum. Few air distended loops of small bowel remain in the mid abdomen which are nonspecific. Several surgical clips noted in the right and left upper quadrant. A left nephroureteral stent is in similar position to prior. Vascular stent noted in the right groin. Lumbosacral hardware is noted and unchanged from prior. Degenerative changes in the spine and pelvis. IMPRESSION: 1. Interval passage of enteric contrast media to the level of the rectum. 2. Few air distended loops of small bowel which remain in the mid abdomen are therefore nonspecific. Electronically Signed   By: Lovena Le M.D.   On: 07/05/2019 19:27   DG Abd Portable 1 View  Result Date: 07/04/2019 CLINICAL DATA:  63 year old male NG tube placement. EXAM: PORTABLE ABDOMEN - 1 VIEW COMPARISON:  CT Abdomen and Pelvis 0958 hours today. FINDINGS: Portable AP upright view at 1148 hours. Enteric tube placed into the stomach. Tip and side hole at the level of the gastric fundus. Gastric distension may be slightly decreased from the CT earlier today.  Dilated small bowel loops persist. No free air identified. Stable lung bases. Left ureteral stent partially visible. IMPRESSION: 1. Enteric tube placed into the stomach, tip and side hole at the level of the gastric fundus. 2. Gastric distension may be slightly decreased since the CT earlier today. Electronically Signed   By: Genevie Ann M.D.   On: 07/04/2019 12:08    Anti-infectives: Anti-infectives  (From admission, onward)   None      Assessment/Plan:  Partial small bowel obstruction resolving.  We will clamp NG tube clamping trial.  We will go slow.  No need for surgical intervention at this time.  Encourage ambulation. Please Note that I spent  35 minutes in this encounter with greater than 50% spent in coordination and counseling of his care  Caroleen Hamman, MD, [place]  07/06/2019

## 2019-07-06 NOTE — Progress Notes (Signed)
PROGRESS NOTE    [name]  [id number] DOB: 1955/12/07 DOA: 07/04/2019 PCP: Perrin Maltese, MD   Brief Narrative:  63 year old with history of PAD, COPD, CAD, HTN, cardiomyopathy presented to the ER with complaints of abdominal pain which was cramping and stabbing in nature along with nausea and vomiting.  Upon admission diagnosis small bowel obstruction, NG tube was inserted and general surgery was consulted.   Assessment & Plan:   Principal Problem:   SBO (small bowel obstruction) (HCC) Active Problems:   Atherosclerosis of native arteries of extremity with intermittent claudication (HCC)   Hyperlipidemia   DJD (degenerative joint disease)   Essential hypertension   COPD (chronic obstructive pulmonary disease) (HCC)   CAD (coronary artery disease)  Intractable nausea vomiting abdominal pain Small bowel obstruction -Conservative management per surgical team.  NG tube in place to suction. -Antiemetics.  Plavix remains on hold. -General surgery following. -LFTs, lipase normal  Chronic congestive heart failure with reduced ejection fraction, 25% -Admitting provider spoke with patient's PCP, ejection fraction 30%.  Gentle hydration -Lasix and Aldactone on hold.  Will continue Coreg, losartan as tolerated.  Essential hypertension -Coreg, losartan  Hyperlipidemia -Crestor  CKD stage IIIb -Creatinine around baseline of 1.9.  History of renal stones -Has been to urology multiple times outpatient for stent placement and removal.  Continue to follow them outpatient    DVT prophylaxis: Lovenox Code Status: Full Family Communication: None at bedside Disposition Plan: Maintain hospital stay until bowel function returns.  Consultants:   General surgery   Subjective: Multiple bowel movements and passing gas but still having some abdominal distention.  NG tube continues to have output.  Review of Systems Otherwise negative except as per HPI, including: General  = no fevers, chills, dizziness, malaise, fatigue HEENT/EYES = negative for pain, redness, loss of vision, double vision, blurred vision, loss of hearing, sore throat, hoarseness, dysphagia Cardiovascular= negative for chest pain, palpitation, murmurs, lower extremity swelling Respiratory/lungs= negative for shortness of breath, cough, hemoptysis, wheezing, mucus production Gastrointestinal= negative formelena, hematemesis Genitourinary= negative for Dysuria, Hematuria, Change in Urinary Frequency MSK = Negative for arthralgia, myalgias, Back Pain, Joint swelling  Neurology= Negative for headache, seizures, numbness, tingling  Psychiatry= Negative for anxiety, depression, suicidal and homocidal ideation Allergy/Immunology= Medication/Food allergy as listed  Skin= Negative for Rash, lesions, ulcers, itching   Objective: Vitals:   07/05/19 0922 07/05/19 1333 07/05/19 2026 07/06/19 0501  BP: 128/69 100/66 129/83 114/64  Pulse: 81 75 75 68  Resp: 11 14 20 20   Temp:  97.8 F (36.6 C) 97.9 F (36.6 C) 97.6 F (36.4 C)  TempSrc:  Oral Oral Oral  SpO2:  95% 95% 92%  Weight:      Height:        Intake/Output Summary (Last 24 hours) at 07/06/2019 0955 Last data filed at 07/06/2019 [id number] Gross per 24 hour  Intake 50 ml  Output 400 ml  Net -350 ml   Filed Weights   07/04/19 0847  Weight: 65.8 kg    Examination:  Constitutional: Not in acute distress, NG tube in place Respiratory: Clear to auscultation bilaterally Cardiovascular: Normal sinus rhythm, no rubs Abdomen: Abdomen is slightly distended.  Bowel sounds heard. Musculoskeletal: No edema noted Skin: No rashes seen Neurologic: CN 2-12 grossly intact.  And nonfocal Psychiatric: Normal judgment and insight. Alert and oriented x 3. Normal mood.     Data Reviewed:   CBC: Recent Labs  Lab 07/04/19 0910 07/05/19 0519 07/06/19 0406  WBC 16.8*  14.2* 10.5  HGB 13.5 12.0* 10.8*  HCT 39.7 37.2* 32.4*  MCV 77.5* 81.8 80.0    PLT 255 228 [id number]   Basic Metabolic Panel: Recent Labs  Lab 07/04/19 0910 07/05/19 0519 07/06/19 0406  NA 131* 137 139  K 5.0 4.8 4.3  CL 92* 99 99  CO2 27 29 31   GLUCOSE 152* 115* 101*  BUN 43* 39* 35*  CREATININE 1.99* 2.01* 2.03*  CALCIUM 10.0 9.1 9.1  MG  --   --  2.2   GFR: Estimated Creatinine Clearance: 35.6 mL/min (A) (by C-G formula based on SCr of 2.03 mg/dL (H)). Liver Function Tests: Recent Labs  Lab 07/04/19 0910 07/05/19 0519  AST 25 15  ALT 12 9  ALKPHOS 69 60  BILITOT 1.2 1.2  PROT 8.0 6.8  ALBUMIN 4.4 3.6   Recent Labs  Lab 07/04/19 0910  LIPASE 19   No results for input(s): AMMONIA in the last 168 hours. Coagulation Profile: No results for input(s): INR, PROTIME in the last 168 hours. Cardiac Enzymes: No results for input(s): CKTOTAL, CKMB, CKMBINDEX, TROPONINI in the last 168 hours. BNP (last 3 results) No results for input(s): PROBNP in the last 8760 hours. HbA1C: No results for input(s): HGBA1C in the last 72 hours. CBG: Recent Labs  Lab 07/05/19 0812 07/06/19 0759  GLUCAP 108* 82   Lipid Profile: No results for input(s): CHOL, HDL, LDLCALC, TRIG, CHOLHDL, LDLDIRECT in the last 72 hours. Thyroid Function Tests: No results for input(s): TSH, T4TOTAL, FREET4, T3FREE, THYROIDAB in the last 72 hours. Anemia Panel: No results for input(s): VITAMINB12, FOLATE, FERRITIN, TIBC, IRON, RETICCTPCT in the last 72 hours. Sepsis Labs: No results for input(s): PROCALCITON, LATICACIDVEN in the last 168 hours.  Recent Results (from the past 240 hour(s))  Urine Culture     Status: Abnormal   Collection Time: 07/04/19  9:10 AM   Specimen: Urine, Clean Catch  Result Value Ref Range Status   Specimen Description   Final    URINE, CLEAN CATCH Performed at [facility], [address] [place], [place] [address]    Special Requests   Final    NONE Performed at [facility], [address], [place], [place] [address]     Culture (A)  Final    <10,000 COLONIES/mL INSIGNIFICANT GROWTH Performed at [facility] [address] [place], [place] [address]    Report Status 07/06/2019 FINAL  Final  Respiratory Panel by RT PCR (Flu A&B, Covid) - Nasopharyngeal Swab     Status: None   Collection Time: 07/04/19 11:35 AM   Specimen: Nasopharyngeal Swab  Result Value Ref Range Status   SARS Coronavirus 2 by RT PCR NEGATIVE NEGATIVE Final    Comment: (NOTE) SARS-CoV-2 target nucleic acids are NOT DETECTED. The SARS-CoV-2 RNA is generally detectable in upper respiratoy specimens during the acute phase of infection. The lowest concentration of SARS-CoV-2 viral copies this assay can detect is 131 copies/mL. A negative result does not preclude SARS-Cov-2 infection and should not be used as the sole basis for treatment or other patient management decisions. A negative result may occur with  improper specimen collection/handling, submission of specimen other than nasopharyngeal swab, presence of viral mutation(s) within the areas targeted by this assay, and inadequate number of viral copies (<131 copies/mL). A negative result must be combined with clinical observations, patient history, and epidemiological information. The expected result is Negative. Fact Sheet for Patients:  PinkCheek.be Fact Sheet for Healthcare Providers:  GravelBags.it This test is not yet ap proved or cleared by the [place] and  has been authorized for detection and/or diagnosis of SARS-CoV-2 by FDA under an Emergency Use Authorization (EUA). This EUA will remain  in effect (meaning this test can be used) for the duration of the COVID-19 declaration under Section 564(b)(1) of the Act, 21 U.S.C. section 360bbb-3(b)(1), unless the authorization is terminated or revoked sooner.    Influenza A by PCR NEGATIVE NEGATIVE Final   Influenza B by PCR NEGATIVE NEGATIVE Final     Comment: (NOTE) The Xpert Xpress SARS-CoV-2/FLU/RSV assay is intended as an aid in  the diagnosis of influenza from Nasopharyngeal swab specimens and  should not be used as a sole basis for treatment. Nasal washings and  aspirates are unacceptable for Xpert Xpress SARS-CoV-2/FLU/RSV  testing. Fact Sheet for Patients: PinkCheek.be Fact Sheet for Healthcare Providers: GravelBags.it This test is not yet approved or cleared by the [place] FDA and  has been authorized for detection and/or diagnosis of SARS-CoV-2 by  FDA under an Emergency Use Authorization (EUA). This EUA will remain  in effect (meaning this test can be used) for the duration of the  Covid-19 declaration under Section 564(b)(1) of the Act, 21  U.S.C. section 360bbb-3(b)(1), unless the authorization is  terminated or revoked. Performed at [facility], [address] [place], [place] [address]          Radiology Studies: DG Abd 1 View  Result Date: 07/05/2019 CLINICAL DATA:  Abdominal distention EXAM: ABDOMEN - 1 VIEW COMPARISON:  And 07/04/2019 FINDINGS: NG tube tip is in the proximal stomach with the side port in the distal esophagus. Left ureteral stent remains in place, unchanged. There are mildly prominent left abdominal small bowel loops, decreasing since prior study. No free air organomegaly. Prior cholecystectomy. IMPRESSION: NG tube tip is in the proximal stomach with the side port in the distal esophagus. This has retracted slightly since prior study. Mildly prominent left abdominal small bowel loops, decreased in distention since prior study compatible with improving small bowel obstruction. Electronically Signed   By: Rolm Baptise M.D.   On: 07/05/2019 09:43   CT ABDOMEN PELVIS W CONTRAST  Addendum Date: 07/04/2019   ADDENDUM REPORT: 07/04/2019 11:04 ADDENDUM: Note that there is asymmetric anterior urinary bladder wall thickening.  Suspect inflammation in this area, potentially at least in part secondary to the presence of the double-J stent on the left. Electronically Signed   By: Lowella Grip III M.D.   On: 07/04/2019 11:04   Result Date: 07/04/2019 CLINICAL DATA:  Diffuse abdominal pain with nausea and vomiting EXAM: CT ABDOMEN AND PELVIS WITH CONTRAST TECHNIQUE: Multidetector CT imaging of the abdomen and pelvis was performed using the standard protocol following bolus administration of intravenous contrast. CONTRAST:  25mL OMNIPAQUE IOHEXOL 300 MG/ML  SOLN COMPARISON:  March 22, 2014 FINDINGS: Lower chest: There is slight atelectasis in the lung bases. Lung bases otherwise are clear. There is thickening of the distal esophageal wall. Hepatobiliary: No focal liver lesions are appreciable. Gallbladder is absent. There is no biliary duct dilatation. Pancreas: There is no pancreatic mass or inflammatory focus. Spleen: There are calcified granulomas throughout the spleen. Spleen otherwise appears unremarkable. Adrenals/Urinary Tract: Adrenals appear normal bilaterally. A double-J stent is seen on the left extending from the renal pelvis to the urinary bladder on the left. There is minimal fullness of the left renal collecting system. There is no hydronephrosis on the left. On the right,  there is a cyst arising from the posterior upper to midportion of the kidney measuring 3.0 x 2.9 cm there are subcentimeter cysts in the mid to lower poles of the left kidney, largest measuring just under 1 cm. There is a staghorn calculus in the lower pole of the right kidney measuring 2.7 x 1.7 cm. There is a calculus in the mid left kidney measuring 0.8 x 0.6 cm. There is no evident ureteral calculus on either side. Urinary bladder is midline with wall thickness within normal limits. Stomach/Bowel: Stomach is distended with fluid and air. The stomach wall is not thickened. There are multiple loops of dilated small bowel with a transition zone in  the mid ileal region consistent with small bowel obstruction. There is no evident free air or portal venous air. A portion of colon has been removed with apparent patent anastomosis. There is moderate stool in colon currently. The terminal ileal region appears unremarkable. Vascular/Lymphatic: There is no abdominal aortic aneurysm. There is aortic and iliac artery atherosclerotic calcification. No adenopathy is appreciable in the abdomen or pelvis. Reproductive: Prostate contains multiple calculi. Prostate and seminal vesicles are normal in size and contour. There is no evident pelvic mass. Other: Appendix appears normal. There is mild partially loculated ascites in the pelvis. No abscess is evident in the abdomen or pelvis. There is scarring in the region of the umbilicus. Musculoskeletal: Postoperative changes noted at L5-S1. There is arthropathy in the lumbar spine. No blastic or lytic bone lesions are evident. No intramuscular lesions are appreciable. IMPRESSION: 1. Small bowel obstruction with transition zone at the mid ileal level. No free air or portal venous air. No demonstrable abscess. No periappendiceal region inflammation. 2. Double-J stent on the left extending from the left renal pelvis to the bladder. Slight hydronephrosis present on the left. 3. Staghorn calculus lower pole right kidney measuring 2.7 x 1.7 cm. Calculus in mid left kidney measuring 0.8 x 0.6 cm. Multiple prostatic calculi noted. 4. Loculated ascites in the pelvis. This ascites may be secondary to the bowel obstruction. 5. Thickening of the wall of the distal esophagus is likely indicative of distal esophagitis. 6.  Aortic Atherosclerosis (ICD10-I70.0). 7.  Absent gallbladder. 8.  Calcified splenic granulomas. Electronically Signed: By: Lowella Grip III M.D. On: 07/04/2019 10:14   DG Abd Portable 1V-Small Bowel Obstruction Protocol-initial, 8 hr delay  Result Date: 07/05/2019 CLINICAL DATA:  Small-bowel obstruction, 8 hour  delay EXAM: PORTABLE ABDOMEN - 1 VIEW COMPARISON:  Radiograph 07/05/2019 FINDINGS: There has been passage of enteric contrast media to the level of the rectum. Few air distended loops of small bowel remain in the mid abdomen which are nonspecific. Several surgical clips noted in the right and left upper quadrant. A left nephroureteral stent is in similar position to prior. Vascular stent noted in the right groin. Lumbosacral hardware is noted and unchanged from prior. Degenerative changes in the spine and pelvis. IMPRESSION: 1. Interval passage of enteric contrast media to the level of the rectum. 2. Few air distended loops of small bowel which remain in the mid abdomen are therefore nonspecific. Electronically Signed   By: Lovena Le M.D.   On: 07/05/2019 19:27   DG Abd Portable 1 View  Result Date: 07/04/2019 CLINICAL DATA:  63 year old male NG tube placement. EXAM: PORTABLE ABDOMEN - 1 VIEW COMPARISON:  CT Abdomen and Pelvis 0958 hours today. FINDINGS: Portable AP upright view at 1148 hours. Enteric tube placed into the stomach. Tip and side hole at the level  of the gastric fundus. Gastric distension may be slightly decreased from the CT earlier today. Dilated small bowel loops persist. No free air identified. Stable lung bases. Left ureteral stent partially visible. IMPRESSION: 1. Enteric tube placed into the stomach, tip and side hole at the level of the gastric fundus. 2. Gastric distension may be slightly decreased since the CT earlier today. Electronically Signed   By: Genevie Ann M.D.   On: 07/04/2019 12:08        Scheduled Meds: . ALPRAZolam  1 mg Oral BID  . carvedilol  25 mg Oral BID  . enoxaparin (LOVENOX) injection  40 mg Subcutaneous Q24H  . losartan  50 mg Oral BH-q7a  . nicotine  14 mg Transdermal Daily  . rosuvastatin  40 mg Oral QPM  . sodium chloride flush  3 mL Intravenous Q12H   Continuous Infusions: . lactated ringers 75 mL/hr at 07/05/19 2152     LOS: 2 days   Time  spent= 35 mins    Emileo Semel Arsenio Loader, MD [facility]  If 7PM-7AM, please contact night-coverage  07/06/2019, 9:55 AM

## 2019-07-07 LAB — BASIC METABOLIC PANEL
Anion gap: 12 (ref 5–15)
BUN: 26 mg/dL — ABNORMAL HIGH (ref 8–23)
CO2: 25 mmol/L (ref 22–32)
Calcium: 8.5 mg/dL — ABNORMAL LOW (ref 8.9–10.3)
Chloride: 100 mmol/L (ref 98–111)
Creatinine, Ser: 1.61 mg/dL — ABNORMAL HIGH (ref 0.61–1.24)
GFR calc Af Amer: 53 mL/min — ABNORMAL LOW (ref >60)
GFR calc non Af Amer: 45 mL/min — ABNORMAL LOW (ref >60)
Glucose, Bld: 69 mg/dL — ABNORMAL LOW (ref 70–99)
Potassium: 4.1 mmol/L (ref 3.5–5.1)
Sodium: 137 mmol/L (ref 135–145)

## 2019-07-07 LAB — CBC
HCT: 30.6 % — ABNORMAL LOW (ref 39.0–52.0)
Hemoglobin: 10.2 g/dL — ABNORMAL LOW (ref 13.0–17.0)
MCH: 26.6 pg (ref 26.0–34.0)
MCHC: 33.3 g/dL (ref 30.0–36.0)
MCV: 79.7 fL — ABNORMAL LOW (ref 80.0–100.0)
Platelets: 165 10*3/uL (ref 150–400)
RBC: 3.84 MIL/uL — ABNORMAL LOW (ref 4.22–5.81)
RDW: 14.9 % (ref 11.5–15.5)
WBC: 7.4 10*3/uL (ref 4.0–10.5)
nRBC: 0 % (ref 0.0–0.2)

## 2019-07-07 LAB — MAGNESIUM: Magnesium: 1.8 mg/dL (ref 1.7–2.4)

## 2019-07-07 LAB — GLUCOSE, CAPILLARY
Glucose-Capillary: 58 mg/dL — ABNORMAL LOW (ref 70–99)
Glucose-Capillary: 60 mg/dL — ABNORMAL LOW (ref 70–99)

## 2019-07-07 MED ORDER — DEXTROSE 50 % IV SOLN
25.0000 mL | INTRAVENOUS | Status: AC
  Administered 2019-07-07: 25 mL via INTRAVENOUS

## 2019-07-07 MED ORDER — DEXTROSE 50 % IV SOLN
INTRAVENOUS | Status: AC
  Filled 2019-07-07: qty 50

## 2019-07-07 NOTE — Progress Notes (Signed)
Hypoglycemic Event  CBG: 58  Treatment: D50 25 mL (12.5 gm)  Symptoms: Hungry  Follow-up CBG: Time:  CBG Result:  Possible Reasons for Event: Other: NPO  Comments/MD notified: Yes    Fuller Mandril, RN

## 2019-07-07 NOTE — Progress Notes (Signed)
[facility] Hospital Day(s): 3.   Post op day(s):  Marland Kitchen   Interval History: Patient seen and examined, no acute events or new complaints overnight. Patient reports feeling great today.  He reports he continue passing gas.  He denies abdominal pain.  There is no pain ideation.  There is no alleviating or aggravating factor.  Vital signs in last 24 hours: [min-max] current  Temp:  [97.8 F (36.6 C)] 97.8 F (36.6 C) (12/26 0512) Pulse Rate:  [59-65] 62 (12/26 0512) Resp:  [16] 16 (12/26 0512) BP: (129-135)/(69-76) 133/76 (12/26 0512) SpO2:  [94 %-98 %] 94 % (12/26 0512)     Height: 5\' 7"  (170.2 cm) Weight: 65.8 kg BMI (Calculated): 22.71   Physical Exam:  Constitutional: alert, cooperative and no distress  Respiratory: breathing non-labored at rest  Cardiovascular: regular rate and sinus rhythm  Gastrointestinal: soft, non-tender, and non-distended  Labs:  CBC Latest Ref Rng & Units 07/07/2019 07/06/2019 07/05/2019  WBC 4.0 - 10.5 K/uL 7.4 10.5 14.2(H)  Hemoglobin 13.0 - 17.0 g/dL 10.2(L) 10.8(L) 12.0(L)  Hematocrit 39.0 - 52.0 % 30.6(L) 32.4(L) 37.2(L)  Platelets 150 - 400 K/uL 165 191 228   CMP Latest Ref Rng & Units 07/07/2019 07/06/2019 07/05/2019  Glucose 70 - 99 mg/dL 69(L) 101(H) 115(H)  BUN 8 - 23 mg/dL 26(H) 35(H) 39(H)  Creatinine 0.61 - 1.24 mg/dL 1.61(H) 2.03(H) 2.01(H)  Sodium 135 - 145 mmol/L 137 139 137  Potassium 3.5 - 5.1 mmol/L 4.1 4.3 4.8  Chloride 98 - 111 mmol/L 100 99 99  CO2 22 - 32 mmol/L 25 31 29   Calcium 8.9 - 10.3 mg/dL 8.5(L) 9.1 9.1  Total Protein 6.5 - 8.1 g/dL - - 6.8  Total Bilirubin 0.3 - 1.2 mg/dL - - 1.2  Alkaline Phos 38 - 126 U/L - - 60  AST 15 - 41 U/L - - 15  ALT 0 - 44 U/L - - 9    Imaging studies: I reevaluated the abdominal x-ray showing contrast in the rectum.  No sign of obstruction.   Assessment/Plan:  63 y.o.malewith small bowel obstruction, complicated by pertinent comorbidities includingcongestive heart  failure, hypertension, hyperlipidemia, nephrolithiasis with stent placement. Patient with resolving small bowel obstruction.  I will start diet today.  Diet can be advanced as tolerated.  He tolerates of diet today he can go home.  No need for outpatient surgery follow-up.  Patient can be followed by primary care physician.  Arnold Long, MD

## 2019-07-07 NOTE — Discharge Summary (Signed)
Physician Discharge Summary  [name] [id number] DOB: [birth year] DOA: 07/04/2019  PCP: Perrin Maltese, MD  Admit date: 07/04/2019 Discharge date: 07/07/2019  Admitted From: Home Disposition: Home  Recommendations for Outpatient Follow-up:  1. Follow up with PCP in 1-2 weeks 2. Please obtain BMP/CBC in one week your next doctors visit.   Home Health: None Equipment/Devices: None Discharge Condition: Stable CODE STATUS: Full code Diet recommendation: Heart healthy soft diet  Brief/Interim Summary: 63 year old with history of PAD, COPD, CAD, HTN, cardiomyopathy presented to the ER with complaints of abdominal pain which was cramping and stabbing in nature along with nausea and vomiting.  Upon admission diagnosis small bowel obstruction, NG tube was inserted and general surgery was consulted.  He was conservatively managed and he did well.  Over the course of 3 days his symptoms greatly improved, he was passing gas and tolerating oral diet. Medically stable to be discharged today with outpatient follow-up as recommended above.   Discharge Diagnoses:  Principal Problem:   SBO (small bowel obstruction) (HCC) Active Problems:   Atherosclerosis of native arteries of extremity with intermittent claudication (HCC)   Hyperlipidemia   DJD (degenerative joint disease)   Essential hypertension   COPD (chronic obstructive pulmonary disease) (HCC)   CAD (coronary artery disease)  Intractable nausea vomiting abdominal pain Small bowel obstruction -Greatly improved with conservative management.  Surgery team following, cleared by their service.  Advancing diet today, if tolerates soft he will be discharged in the afternoon.  Chronic congestive heart failure with reduced ejection fraction, 25% -Appears to be euvolemic.  Admitting provider spoke with patient's PCP, ejection fraction 30%.  Gentle oral hydration -Resume his home medications.  Essential hypertension -Coreg,  losartan  Hyperlipidemia -Crestor  CKD stage IIIb -Creatinine around baseline of 1.9.  History of renal stones -Has been to urology multiple times outpatient for stent placement and removal.  Continue to follow them outpatient   Consultations:  General surgery  Subjective: Episode of hypoglycemia this morning due to being n.p.o. but now tolerating orals doing well.  Passing gas and having bowel movement.  Discharge Exam: Vitals:   07/06/19 2034 07/07/19 0512  BP: 135/69 133/76  Pulse: (!) 59 62  Resp: 16 16  Temp: 97.8 F (36.6 C) 97.8 F (36.6 C)  SpO2: 95% 94%   Vitals:   07/06/19 0501 07/06/19 1224 07/06/19 2034 07/07/19 0512  BP: 114/64 129/75 135/69 133/76  Pulse: 68 65 (!) 59 62  Resp: 20  16 16   Temp: 97.6 F (36.4 C)  97.8 F (36.6 C) 97.8 F (36.6 C)  TempSrc: Oral  Oral Oral  SpO2: 92% 98% 95% 94%  Weight:      Height:        General: Pt is alert, awake, not in acute distress Cardiovascular: RRR, S1/S2 +, no rubs, no gallops Respiratory: CTA bilaterally, no wheezing, no rhonchi Abdominal: Soft, NT, ND, bowel sounds + Extremities: no edema, no cyanosis  Discharge Instructions   Allergies as of 07/07/2019      Reactions   Codeine Itching   Dilaudid [hydromorphone] Other (See Comments)   Severe headache      Medication List    TAKE these medications   albuterol 108 (90 Base) MCG/ACT inhaler Commonly known as: VENTOLIN HFA Inhale 2 puffs into the lungs every 6 (six) hours as needed for wheezing or shortness of breath.   ALPRAZolam 1 MG tablet Commonly known as: XANAX Take 1 mg by mouth 2 (two) times daily.  carvedilol 25 MG tablet Commonly known as: COREG Take 25 mg by mouth 2 (two) times daily.   clopidogrel 75 MG tablet Commonly known as: Plavix Take 1 tablet (75 mg total) by mouth daily.   fluticasone 50 MCG/ACT nasal spray Commonly known as: FLONASE Place 1 spray into both nostrils daily as needed for allergies.    furosemide 40 MG tablet Commonly known as: LASIX Take 40 mg by mouth daily as needed (fluid retention.).   ipratropium-albuterol 0.5-2.5 (3) MG/3ML Soln Commonly known as: DUONEB Take 3 mLs by nebulization every 4 (four) hours as needed (for SOB).   losartan 50 MG tablet Commonly known as: COZAAR Take 50 mg by mouth every morning.   meloxicam 15 MG tablet Commonly known as: MOBIC Take 15 mg by mouth daily as needed for pain.   oxyCODONE 15 MG immediate release tablet Commonly known as: ROXICODONE Take 15 mg by mouth 4 (four) times daily.   rosuvastatin 40 MG tablet Commonly known as: CRESTOR Take 40 mg by mouth every evening.   spironolactone 25 MG tablet Commonly known as: ALDACTONE Take 25 mg by mouth daily.      Follow-up Information    Perrin Maltese, MD. Schedule an appointment as soon as possible for a visit in 2 week(s).   Specialty: Internal Medicine Contact information: [place] [address] [phone]          Allergies  Allergen Reactions  . Codeine Itching  . Dilaudid [Hydromorphone] Other (See Comments)    Severe headache    You were cared for by a hospitalist during your hospital stay. If you have any questions about your discharge medications or the care you received while you were in the hospital after you are discharged, you can call the unit and asked to speak with the hospitalist on call if the hospitalist that took care of you is not available. Once you are discharged, your primary care physician will handle any further medical issues. Please note that no refills for any discharge medications will be authorized once you are discharged, as it is imperative that you return to your primary care physician (or establish a relationship with a primary care physician if you do not have one) for your aftercare needs so that they can reassess your need for medications and monitor your lab values.   Procedures/Studies: DG Abd 1  View  Result Date: 07/05/2019 CLINICAL DATA:  Abdominal distention EXAM: ABDOMEN - 1 VIEW COMPARISON:  And 07/04/2019 FINDINGS: NG tube tip is in the proximal stomach with the side port in the distal esophagus. Left ureteral stent remains in place, unchanged. There are mildly prominent left abdominal small bowel loops, decreasing since prior study. No free air organomegaly. Prior cholecystectomy. IMPRESSION: NG tube tip is in the proximal stomach with the side port in the distal esophagus. This has retracted slightly since prior study. Mildly prominent left abdominal small bowel loops, decreased in distention since prior study compatible with improving small bowel obstruction. Electronically Signed   By: Rolm Baptise M.D.   On: 07/05/2019 09:43   CT ABDOMEN PELVIS W CONTRAST  Addendum Date: 07/04/2019   ADDENDUM REPORT: 07/04/2019 11:04 ADDENDUM: Note that there is asymmetric anterior urinary bladder wall thickening. Suspect inflammation in this area, potentially at least in part secondary to the presence of the double-J stent on the left. Electronically Signed   By: Lowella Grip III M.D.   On: 07/04/2019 11:04   Result Date: 07/04/2019 CLINICAL DATA:  Diffuse abdominal pain with nausea and vomiting EXAM: CT ABDOMEN AND PELVIS WITH CONTRAST TECHNIQUE: Multidetector CT imaging of the abdomen and pelvis was performed using the standard protocol following bolus administration of intravenous contrast. CONTRAST:  32mL OMNIPAQUE IOHEXOL 300 MG/ML  SOLN COMPARISON:  March 22, 2014 FINDINGS: Lower chest: There is slight atelectasis in the lung bases. Lung bases otherwise are clear. There is thickening of the distal esophageal wall. Hepatobiliary: No focal liver lesions are appreciable. Gallbladder is absent. There is no biliary duct dilatation. Pancreas: There is no pancreatic mass or inflammatory focus. Spleen: There are calcified granulomas throughout the spleen. Spleen otherwise appears unremarkable.  Adrenals/Urinary Tract: Adrenals appear normal bilaterally. A double-J stent is seen on the left extending from the renal pelvis to the urinary bladder on the left. There is minimal fullness of the left renal collecting system. There is no hydronephrosis on the left. On the right, there is a cyst arising from the posterior upper to midportion of the kidney measuring 3.0 x 2.9 cm there are subcentimeter cysts in the mid to lower poles of the left kidney, largest measuring just under 1 cm. There is a staghorn calculus in the lower pole of the right kidney measuring 2.7 x 1.7 cm. There is a calculus in the mid left kidney measuring 0.8 x 0.6 cm. There is no evident ureteral calculus on either side. Urinary bladder is midline with wall thickness within normal limits. Stomach/Bowel: Stomach is distended with fluid and air. The stomach wall is not thickened. There are multiple loops of dilated small bowel with a transition zone in the mid ileal region consistent with small bowel obstruction. There is no evident free air or portal venous air. A portion of colon has been removed with apparent patent anastomosis. There is moderate stool in colon currently. The terminal ileal region appears unremarkable. Vascular/Lymphatic: There is no abdominal aortic aneurysm. There is aortic and iliac artery atherosclerotic calcification. No adenopathy is appreciable in the abdomen or pelvis. Reproductive: Prostate contains multiple calculi. Prostate and seminal vesicles are normal in size and contour. There is no evident pelvic mass. Other: Appendix appears normal. There is mild partially loculated ascites in the pelvis. No abscess is evident in the abdomen or pelvis. There is scarring in the region of the umbilicus. Musculoskeletal: Postoperative changes noted at L5-S1. There is arthropathy in the lumbar spine. No blastic or lytic bone lesions are evident. No intramuscular lesions are appreciable. IMPRESSION: 1. Small bowel obstruction  with transition zone at the mid ileal level. No free air or portal venous air. No demonstrable abscess. No periappendiceal region inflammation. 2. Double-J stent on the left extending from the left renal pelvis to the bladder. Slight hydronephrosis present on the left. 3. Staghorn calculus lower pole right kidney measuring 2.7 x 1.7 cm. Calculus in mid left kidney measuring 0.8 x 0.6 cm. Multiple prostatic calculi noted. 4. Loculated ascites in the pelvis. This ascites may be secondary to the bowel obstruction. 5. Thickening of the wall of the distal esophagus is likely indicative of distal esophagitis. 6.  Aortic Atherosclerosis (ICD10-I70.0). 7.  Absent gallbladder. 8.  Calcified splenic granulomas. Electronically Signed: By: Lowella Grip III M.D. On: 07/04/2019 10:14   DG Abd Portable 1V-Small Bowel Obstruction Protocol-initial, 8 hr delay  Result Date: 07/05/2019 CLINICAL DATA:  Small-bowel obstruction, 8 hour delay EXAM: PORTABLE ABDOMEN - 1 VIEW COMPARISON:  Radiograph 07/05/2019 FINDINGS: There has been passage of enteric contrast media to the level of the rectum. Few air  distended loops of small bowel remain in the mid abdomen which are nonspecific. Several surgical clips noted in the right and left upper quadrant. A left nephroureteral stent is in similar position to prior. Vascular stent noted in the right groin. Lumbosacral hardware is noted and unchanged from prior. Degenerative changes in the spine and pelvis. IMPRESSION: 1. Interval passage of enteric contrast media to the level of the rectum. 2. Few air distended loops of small bowel which remain in the mid abdomen are therefore nonspecific. Electronically Signed   By: Lovena Le M.D.   On: 07/05/2019 19:27   DG Abd Portable 1 View  Result Date: 07/04/2019 CLINICAL DATA:  63 year old male NG tube placement. EXAM: PORTABLE ABDOMEN - 1 VIEW COMPARISON:  CT Abdomen and Pelvis 0958 hours today. FINDINGS: Portable AP upright view at 1148  hours. Enteric tube placed into the stomach. Tip and side hole at the level of the gastric fundus. Gastric distension may be slightly decreased from the CT earlier today. Dilated small bowel loops persist. No free air identified. Stable lung bases. Left ureteral stent partially visible. IMPRESSION: 1. Enteric tube placed into the stomach, tip and side hole at the level of the gastric fundus. 2. Gastric distension may be slightly decreased since the CT earlier today. Electronically Signed   By: Genevie Ann M.D.   On: 07/04/2019 12:08      The results of significant diagnostics from this hospitalization (including imaging, microbiology, ancillary and laboratory) are listed below for reference.     Microbiology: Recent Results (from the past 240 hour(s))  Urine Culture     Status: Abnormal   Collection Time: 07/04/19  9:10 AM   Specimen: Urine, Clean Catch  Result Value Ref Range Status   Specimen Description   Final    URINE, CLEAN CATCH Performed at [facility], [address], [place], [place] [address]    Special Requests   Final    NONE Performed at [facility], [address] [place], [place] [address]    Culture (A)  Final    <10,000 COLONIES/mL INSIGNIFICANT GROWTH Performed at [facility] [address], [place], [place] [address]    Report Status 07/06/2019 FINAL  Final  Respiratory Panel by RT PCR (Flu A&B, Covid) - Nasopharyngeal Swab     Status: None   Collection Time: 07/04/19 11:35 AM   Specimen: Nasopharyngeal Swab  Result Value Ref Range Status   SARS Coronavirus 2 by RT PCR NEGATIVE NEGATIVE Final    Comment: (NOTE) SARS-CoV-2 target nucleic acids are NOT DETECTED. The SARS-CoV-2 RNA is generally detectable in upper respiratoy specimens during the acute phase of infection. The lowest concentration of SARS-CoV-2 viral copies this assay can detect is 131 copies/mL. A negative result does not preclude SARS-Cov-2 infection and should not be  used as the sole basis for treatment or other patient management decisions. A negative result may occur with  improper specimen collection/handling, submission of specimen other than nasopharyngeal swab, presence of viral mutation(s) within the areas targeted by this assay, and inadequate number of viral copies (<131 copies/mL). A negative result must be combined with clinical observations, patient history, and epidemiological information. The expected result is Negative. Fact Sheet for Patients:  PinkCheek.be Fact Sheet for Healthcare Providers:  GravelBags.it This test is not yet ap proved or cleared by the [place] FDA and  has been authorized for detection and/or diagnosis of SARS-CoV-2 by FDA under an Emergency Use Authorization (EUA). This  EUA will remain  in effect (meaning this test can be used) for the duration of the COVID-19 declaration under Section 564(b)(1) of the Act, 21 U.S.C. section 360bbb-3(b)(1), unless the authorization is terminated or revoked sooner.    Influenza A by PCR NEGATIVE NEGATIVE Final   Influenza B by PCR NEGATIVE NEGATIVE Final    Comment: (NOTE) The Xpert Xpress SARS-CoV-2/FLU/RSV assay is intended as an aid in  the diagnosis of influenza from Nasopharyngeal swab specimens and  should not be used as a sole basis for treatment. Nasal washings and  aspirates are unacceptable for Xpert Xpress SARS-CoV-2/FLU/RSV  testing. Fact Sheet for Patients: PinkCheek.be Fact Sheet for Healthcare Providers: GravelBags.it This test is not yet approved or cleared by the [place] FDA and  has been authorized for detection and/or diagnosis of SARS-CoV-2 by  FDA under an Emergency Use Authorization (EUA). This EUA will remain  in effect (meaning this test can be used) for the duration of the  Covid-19 declaration under Section 564(b)(1) of the  Act, 21  U.S.C. section 360bbb-3(b)(1), unless the authorization is  terminated or revoked. Performed at [facility], [address] [place], [place] [address]      Labs: BNP (last 3 results) No results for input(s): BNP in the last 8760 hours. Basic Metabolic Panel: Recent Labs  Lab 07/04/19 0910 07/05/19 0519 07/06/19 0406 07/07/19 0608  NA 131* 137 139 137  K 5.0 4.8 4.3 4.1  CL 92* 99 99 100  CO2 27 29 31 25   GLUCOSE 152* 115* 101* 69*  BUN 43* 39* 35* 26*  CREATININE 1.99* 2.01* 2.03* 1.61*  CALCIUM 10.0 9.1 9.1 8.5*  MG  --   --  2.2 1.8   Liver Function Tests: Recent Labs  Lab 07/04/19 0910 07/05/19 0519  AST 25 15  ALT 12 9  ALKPHOS 69 60  BILITOT 1.2 1.2  PROT 8.0 6.8  ALBUMIN 4.4 3.6   Recent Labs  Lab 07/04/19 0910  LIPASE 19   No results for input(s): AMMONIA in the last 168 hours. CBC: Recent Labs  Lab 07/04/19 0910 07/05/19 0519 07/06/19 0406 07/07/19 0608  WBC 16.8* 14.2* 10.5 7.4  HGB 13.5 12.0* 10.8* 10.2*  HCT 39.7 37.2* 32.4* 30.6*  MCV 77.5* 81.8 80.0 79.7*  PLT 255 228 191 165   Cardiac Enzymes: No results for input(s): CKTOTAL, CKMB, CKMBINDEX, TROPONINI in the last 168 hours. BNP: Invalid input(s): POCBNP CBG: Recent Labs  Lab 07/05/19 0812 07/06/19 0759 07/07/19 0732 07/07/19 0747  GLUCAP 108* 82 60* 58*   D-Dimer No results for input(s): DDIMER in the last 72 hours. Hgb A1c No results for input(s): HGBA1C in the last 72 hours. Lipid Profile No results for input(s): CHOL, HDL, LDLCALC, TRIG, CHOLHDL, LDLDIRECT in the last 72 hours. Thyroid function studies No results for input(s): TSH, T4TOTAL, T3FREE, THYROIDAB in the last 72 hours.  Invalid input(s): FREET3 Anemia work up No results for input(s): VITAMINB12, FOLATE, FERRITIN, TIBC, IRON, RETICCTPCT in the last 72 hours. Urinalysis    Component Value Date/Time   COLORURINE YELLOW (A) 07/04/2019 0910   APPEARANCEUR CLEAR (A) 07/04/2019 0910    APPEARANCEUR Hazy (A) 05/17/2019 1323   LABSPEC 1.010 07/04/2019 0910   LABSPEC 1.032 03/23/2014 1721   PHURINE 6.0 07/04/2019 0910   GLUCOSEU NEGATIVE 07/04/2019 0910   GLUCOSEU Negative 03/23/2014 1721   HGBUR MODERATE (A) 07/04/2019 0910   BILIRUBINUR NEGATIVE 07/04/2019 0910   BILIRUBINUR Negative 05/17/2019 1323   BILIRUBINUR  Negative 03/23/2014 1721   KETONESUR NEGATIVE 07/04/2019 0910   PROTEINUR 30 (A) 07/04/2019 0910   UROBILINOGEN 0.2 08/07/2007 1255   NITRITE NEGATIVE 07/04/2019 0910   LEUKOCYTESUR SMALL (A) 07/04/2019 0910   LEUKOCYTESUR Trace 03/23/2014 1721   Sepsis Labs Invalid input(s): PROCALCITONIN,  WBC,  LACTICIDVEN Microbiology Recent Results (from the past 240 hour(s))  Urine Culture     Status: Abnormal   Collection Time: 07/04/19  9:10 AM   Specimen: Urine, Clean Catch  Result Value Ref Range Status   Specimen Description   Final    URINE, CLEAN CATCH Performed at [facility], [address] [place], [place] [address]    Special Requests   Final    NONE Performed at [facility], [address] [place], [place] [address]    Culture (A)  Final    <10,000 COLONIES/mL INSIGNIFICANT GROWTH Performed at [facility], [place] [address] [place], [place] [address]    Report Status 07/06/2019 FINAL  Final  Respiratory Panel by RT PCR (Flu A&B, Covid) - Nasopharyngeal Swab     Status: None   Collection Time: 07/04/19 11:35 AM   Specimen: Nasopharyngeal Swab  Result Value Ref Range Status   SARS Coronavirus 2 by RT PCR NEGATIVE NEGATIVE Final    Comment: (NOTE) SARS-CoV-2 target nucleic acids are NOT DETECTED. The SARS-CoV-2 RNA is generally detectable in upper respiratoy specimens during the acute phase of infection. The lowest concentration of SARS-CoV-2 viral copies this assay can detect is 131 copies/mL. A negative result does not preclude SARS-Cov-2 infection and should not be used as the sole basis for treatment or other  patient management decisions. A negative result may occur with  improper specimen collection/handling, submission of specimen other than nasopharyngeal swab, presence of viral mutation(s) within the areas targeted by this assay, and inadequate number of viral copies (<131 copies/mL). A negative result must be combined with clinical observations, patient history, and epidemiological information. The expected result is Negative. Fact Sheet for Patients:  PinkCheek.be Fact Sheet for Healthcare Providers:  GravelBags.it This test is not yet ap proved or cleared by the [place] FDA and  has been authorized for detection and/or diagnosis of SARS-CoV-2 by FDA under an Emergency Use Authorization (EUA). This EUA will remain  in effect (meaning this test can be used) for the duration of the COVID-19 declaration under Section 564(b)(1) of the Act, 21 U.S.C. section 360bbb-3(b)(1), unless the authorization is terminated or revoked sooner.    Influenza A by PCR NEGATIVE NEGATIVE Final   Influenza B by PCR NEGATIVE NEGATIVE Final    Comment: (NOTE) The Xpert Xpress SARS-CoV-2/FLU/RSV assay is intended as an aid in  the diagnosis of influenza from Nasopharyngeal swab specimens and  should not be used as a sole basis for treatment. Nasal washings and  aspirates are unacceptable for Xpert Xpress SARS-CoV-2/FLU/RSV  testing. Fact Sheet for Patients: PinkCheek.be Fact Sheet for Healthcare Providers: GravelBags.it This test is not yet approved or cleared by the [place] FDA and  has been authorized for detection and/or diagnosis of SARS-CoV-2 by  FDA under an Emergency Use Authorization (EUA). This EUA will remain  in effect (meaning this test can be used) for the duration of the  Covid-19 declaration under Section 564(b)(1) of the Act, 21  U.S.C. section 360bbb-3(b)(1), unless  the authorization is  terminated or revoked. Performed at [facility], [address], [place], [place] [address]      Time coordinating  discharge:  I have spent 35 minutes face to face with the patient and on the ward discussing the patients care, assessment, plan and disposition with other care givers. >50% of the time was devoted counseling the patient about the risks and benefits of treatment/Discharge disposition and coordinating care.   SIGNED:   Damita Lack, MD  [facility] 07/07/2019, 10:03 AM   If 7PM-7AM, please contact night-coverage

## 2019-07-07 NOTE — Progress Notes (Signed)
[name] to be D/C'd Home per MD order.  Discussed prescriptions and follow up appointments with the patient. Prescriptions given to patient, medication list explained in detail. Pt verbalized understanding.  Allergies as of 07/07/2019      Reactions   Codeine Itching   Dilaudid [hydromorphone] Other (See Comments)   Severe headache      Medication List    TAKE these medications   albuterol 108 (90 Base) MCG/ACT inhaler Commonly known as: VENTOLIN HFA Inhale 2 puffs into the lungs every 6 (six) hours as needed for wheezing or shortness of breath.   ALPRAZolam 1 MG tablet Commonly known as: XANAX Take 1 mg by mouth 2 (two) times daily.   carvedilol 25 MG tablet Commonly known as: COREG Take 25 mg by mouth 2 (two) times daily.   clopidogrel 75 MG tablet Commonly known as: Plavix Take 1 tablet (75 mg total) by mouth daily.   fluticasone 50 MCG/ACT nasal spray Commonly known as: FLONASE Place 1 spray into both nostrils daily as needed for allergies.   furosemide 40 MG tablet Commonly known as: LASIX Take 40 mg by mouth daily as needed (fluid retention.).   ipratropium-albuterol 0.5-2.5 (3) MG/3ML Soln Commonly known as: DUONEB Take 3 mLs by nebulization every 4 (four) hours as needed (for SOB).   losartan 50 MG tablet Commonly known as: COZAAR Take 50 mg by mouth every morning.   meloxicam 15 MG tablet Commonly known as: MOBIC Take 15 mg by mouth daily as needed for pain.   oxyCODONE 15 MG immediate release tablet Commonly known as: ROXICODONE Take 15 mg by mouth 4 (four) times daily.   rosuvastatin 40 MG tablet Commonly known as: CRESTOR Take 40 mg by mouth every evening.   spironolactone 25 MG tablet Commonly known as: ALDACTONE Take 25 mg by mouth daily.       Vitals:   07/07/19 0512 07/07/19 1342  BP: 133/76 110/62  Pulse: 62 63  Resp: 16 16  Temp: 97.8 F (36.6 C) 97.8 F (36.6 C)  SpO2: 94% 97%    Skin clean, dry and intact without  evidence of skin break down, no evidence of skin tears noted. IV catheter discontinued intact. Site without signs and symptoms of complications. Dressing and pressure applied. Pt denies pain at this time. No complaints noted.  An After Visit Summary was printed and given to the patient. Patient escorted via [place], and D/C home via private auto.  Fuller Mandril, RN

## 2019-07-07 NOTE — Discharge Instructions (Signed)
You were cared for by a hospitalist during your hospital stay. If you have any questions about your discharge medications or the care you received while you were in the hospital after you are discharged, you can call the unit and asked to speak with the hospitalist on call if the hospitalist that took care of you is not available. Once you are discharged, your primary care physician will handle any further medical issues. Please note that NO REFILLS for any discharge medications will be authorized once you are discharged, as it is imperative that you return to your primary care physician (or establish a relationship with a primary care physician if you do not have one) for your aftercare needs so that they can reassess your need for medications and monitor your lab values.  Please request your Prim.MD to go over all Hospital Tests and Procedure/Radiological results at the follow up, please get all Hospital records sent to your Prim MD by signing hospital release before you go home.  Get CBC, CMP, 2 view Chest X ray checked  by Primary MD during your next visit or SNF MD in 5-7 days ( we routinely change or add medications that can affect your baseline labs and fluid status, therefore we recommend that you get the mentioned basic workup next visit with your PCP, your PCP may decide not to get them or add new tests based on their clinical decision)  On your next visit with your primary care physician please Get Medicines reviewed and adjusted.  If you experience worsening of your admission symptoms, develop shortness of breath, life threatening emergency, suicidal or homicidal thoughts you must seek medical attention immediately by calling 911 or calling your MD immediately  if symptoms less severe.  You Must read complete instructions/literature along with all the possible adverse reactions/side effects for all the Medicines you take and that have been prescribed to you. Take any new Medicines after you  have completely understood and accpet all the possible adverse reactions/side effects.   Do not drive, operate heavy machinery, perform activities at heights, swimming or participation in water activities or provide baby sitting services if your were admitted for syncope or siezures until you have seen by Primary MD or a Neurologist and advised to do so again.  Do not drive when taking Pain medications.  

## 2019-07-09 ENCOUNTER — Encounter
Admission: RE | Admit: 2019-07-09 | Discharge: 2019-07-09 | Disposition: A | Discharge status: Home / Self Care | Payer: Medicare Other | Source: Ambulatory Visit | Attending: Urology | Admitting: Urology

## 2019-07-09 ENCOUNTER — Other Ambulatory Visit: Payer: Self-pay

## 2019-07-09 DIAGNOSIS — N200 Calculus of kidney: Secondary | ICD-10-CM | POA: Diagnosis not present

## 2019-07-09 DIAGNOSIS — Z01812 Encounter for preprocedural laboratory examination: Secondary | ICD-10-CM | POA: Insufficient documentation

## 2019-07-09 DIAGNOSIS — I447 Left bundle-branch block, unspecified: Secondary | ICD-10-CM | POA: Insufficient documentation

## 2019-07-09 LAB — URINALYSIS, ROUTINE W REFLEX MICROSCOPIC
Bacteria, UA: NONE SEEN
Bilirubin Urine: NEGATIVE
Glucose, UA: NEGATIVE mg/dL
Ketones, ur: NEGATIVE mg/dL
Nitrite: NEGATIVE
Protein, ur: NEGATIVE mg/dL
Specific Gravity, Urine: 1.005 (ref 1.005–1.030)
pH: 6 (ref 5.0–8.0)

## 2019-07-09 NOTE — Pre-Procedure Instructions (Signed)
EKG: Interpreted by me, sinus rhythm with rate of 87 bpm, left bundle branch block, long QT Procedures  [name] was evaluated in Emergency Department on 07/04/2019 for the symptoms described in the history of present illness. He was evaluated in the context of the global COVID-19 pandemic, which necessitated consideration that the patient might be at risk for infection with the SARS-CoV-2 virus that causes COVID-19. Institutional protocols and algorithms that pertain to the evaluation of patients at risk for COVID-19 are in a state of rapid change based on information released by regulatory bodies including the CDC and federal and state organizations. These policies and algorithms were followed during the patient's care in the ED.  ____________________________________________   LABS (pertinent positives/negatives)       Labs Reviewed  COMPREHENSIVE METABOLIC PANEL - Abnormal; Notable for the following components:      Result Value    Sodium 131 (*)    Chloride 92 (*)    Glucose, Bld 152 (*)    BUN 43 (*)    Creatinine, Ser 1.99 (*)    GFR calc non Af Amer 35 (*)    GFR calc Af Amer 41 (*)    All other components within normal limits  CBC - Abnormal; Notable for the following components:   WBC 16.8 (*)    MCV 77.5 (*)    All other components within normal limits  URINALYSIS, COMPLETE (UACMP) WITH MICROSCOPIC - Abnormal; Notable for the following components:   Color, Urine YELLOW (*)    APPearance CLEAR (*)    Hgb urine dipstick MODERATE (*)    Protein, ur 30 (*)    Leukocytes,Ua SMALL (*)    All other components within normal limits  LIPASE, BLOOD   CRITICAL CARE Performed by: Laurence Aly   Total critical care time: 30 minutes  Critical care time was exclusive of separately billable procedures and treating other patients.  Critical care was necessary to treat or prevent imminent or life-threatening  deterioration.  Critical care was time spent personally by me on the following activities: development of treatment plan with patient and/or surrogate as well as nursing, discussions with consultants, evaluation of patient's response to treatment, examination of patient, obtaining history from patient or surrogate, ordering and performing treatments and interventions, ordering and review of laboratory studies, ordering and review of radiographic studies, pulse oximetry and re-evaluation of patient's condition.   RADIOLOGY Images were viewed by me  CT of the abdomen pelvis with contrast  IMPRESSION:  1. Small bowel obstruction with transition zone at the mid ileal  level. No free air or portal venous air. No demonstrable abscess. No  periappendiceal region inflammation.   2. Double-J stent on the left extending from the left renal pelvis  to the bladder. Slight hydronephrosis present on the left.   3. Staghorn calculus lower pole right kidney measuring 2.7 x 1.7 cm.  Calculus in mid left kidney measuring 0.8 x 0.6 cm. Multiple  prostatic calculi noted.   4. Loculated ascites in the pelvis. This ascites may be secondary to  the bowel obstruction.   5. Thickening of the wall of the distal esophagus is likely  indicative of distal esophagitis.   6. Aortic Atherosclerosis (ICD10-I70.0).   7. Absent gallbladder.   8. Calcified splenic granulomas.  ____________________________________________  DIFFERENTIAL DIAGNOSIS   Diverticulitis, renal colic, UTI, pyelonephritis, obstruction, chronic pain  FINAL ASSESSMENT AND PLAN  Small bowel obstruction   Plan: The patient had presented for severe abdominal  pain. Patient's labs did reveal leukocytosis which is acute on chronic for him as well as chronic kidney disease which is also chronic. Patient's imaging revealed a small bowel obstruction with transition zone in the mid ileus.  His stomach is very distended, we will place  an NG tube.  I have discussed with general surgery and will consult the hospitalist for admission.   Laurence Aly, MD   Note: This note was generated in part or whole with voice recognition software. Voice recognition is usually quite accurate but there are transcription errors that can and very often do occur. I apologize for any typographical errors that were not detected and corrected.     Earleen Newport, MD 07/04/19 1035         Electronically signed by Earleen Newport, MD at 07/04/2019 10:35 AM   ED to Hosp-Admission (Discharged) on 07/04/2019     Detailed Report    Note shared with patient

## 2019-07-09 NOTE — Patient Instructions (Addendum)
Your procedure is scheduled on: 07-15-18 MONDAY Report to Same Day Surgery 2nd floor medical mall [facility] Entrance-take elevator on left to 2nd floor.  Check in with surgery information desk.) To find out your arrival time please call [phone] between 1PM - 3PM on 07-12-19 THURSDAY  Remember: Instructions that are not followed completely may result in serious medical risk, up to and including death, or upon the discretion of your surgeon and anesthesiologist your surgery may need to be rescheduled.    _x___ 1. Do not eat food after midnight the night before your procedure. NO GUM OR CANDY AFTER MIDNIGHT. You may drink clear liquids up to 2 hours before you are scheduled to arrive at the hospital for your procedure.  Do not drink clear liquids within 2 hours of your scheduled arrival to the hospital.  Clear liquids include  --Water or Apple juice without pulp  --Gatorade  --Black Coffee or Clear Tea (No milk, no creamers, do not add anything to the coffee or Tea   ____Ensure clear carbohydrate drink on the way to the hospital for bariatric patients  ____Ensure clear carbohydrate drink 3 hours before surgery.     __x__ 2. No Alcohol for 24 hours before or after surgery.   __x__3. No Smoking or e-cigarettes for 24 prior to surgery.  Do not use any chewable tobacco products for at least 6 hour prior to surgery   ____  4. Bring all medications with you on the day of surgery if instructed.    __x__ 5. Notify your doctor if there is any change in your medical condition     (cold, fever, infections).    x___6. On the morning of surgery brush your teeth with toothpaste and water.  You may rinse your mouth with mouth wash if you wish.  Do not swallow any toothpaste or mouthwash.   Do not wear jewelry, make-up, hairpins, clips or nail polish.  Do not wear lotions, powders, or perfumes. You may wear deodorant.  Do not shave 48 hours prior to surgery. Men may shave face and neck.  Do  not bring valuables to the hospital.    [facility] is not responsible for any belongings or valuables.               Contacts, dentures or bridgework may not be worn into surgery.  Leave your suitcase in the car. After surgery it may be brought to your room.  For patients admitted to the hospital, discharge time is determined by your treatment team.  _  Patients discharged the day of surgery will not be allowed to drive home.  You will need someone to drive you home and stay with you the night of your procedure.    Please read over the following fact sheets that you were given:   [facility] Preparing for Surgery and or MRSA Information   _x___ TAKE THE FOLLOWING MEDICATION THE MORNING OF SURGERY WITH A SMALL SIP OF WATER. These include:  1. OXYCODONE (ROXICODONE)  2. CARVEDILOL (COREG)  3. XANAX (APLRAZOLAM)  4.  5.  6.  ____Fleets enema or Magnesium Citrate as directed.   ____ Use CHG Soap or sage wipes as directed on instruction sheet   _X___ Use inhalers on the day of surgery and bring to hospital day of surgery-USE YOUR ALBUTEROL NEBULIZER AND BRING ALBUTEROL [place]  ____ Stop Metformin and Janumet 2 days prior to surgery.    ____ Take 1/2 of usual insulin dose  the night before surgery and none on the morning surgery.   _x___ Follow recommendations from Cardiologist, Pulmonologist or PCP regarding stopping Aspirin, Coumadin, Plavix ,Eliquis, Effient, or Pradaxa, and Pletal-CALL DR Cherrie Gauze OFFICE REGARDING STOPPING YOUR PLAVIX  X____Stop Anti-inflammatories such as Advil, Aleve, Ibuprofen, Motrin, Naproxen, Naprosyn, Goodies powders or aspirin products. OK to take Tylenol OR OXYCODONE IF NEEDED   ____ Stop supplements until after surgery.     ____ Bring C-Pap to the hospital.

## 2019-07-09 NOTE — Pre-Procedure Instructions (Signed)
ECG 12 lead6/14/2009 [facility] Health Care Component Name Value Ref Range  EKG Ventricular Rate 72 bpm  EKG P-R Interval 160 ms  EKG QRS Duration 144 ms  EKG Q-T Interval 446 ms  EKG QTC Calculation 488 ms  EKG Calculated P Axis 68 degrees  EKG Calculated R Axis 67 degrees  EKG Calculated T Axis 249 degrees  Result Narrative  NORMAL SINUS RHYTHM LEFT BUNDLE BRANCH BLOCK ABNORMAL ECG WHEN COMPARED WITH ECG OF 10-Apr-2006 21:30, NO SIGNIFICANT CHANGE WAS FOUND Confirmed by MILLER MD, PAULA ([id number]) on 12/25/2007 8:10:58 PM  Status Results Details   Encounter Summary

## 2019-07-10 LAB — URINE CULTURE: Culture: <10000 — AB

## 2019-07-12 ENCOUNTER — Other Ambulatory Visit: Admission: RE | Admit: 2019-07-12 | Payer: Medicare Other | Source: Ambulatory Visit

## 2019-07-12 ENCOUNTER — Other Ambulatory Visit
Admission: RE | Admit: 2019-07-12 | Discharge: 2019-07-12 | Disposition: A | Discharge status: Home / Self Care | Payer: Medicare Other | Source: Ambulatory Visit | Attending: Urology | Admitting: Urology

## 2019-07-12 ENCOUNTER — Other Ambulatory Visit: Payer: Self-pay

## 2019-07-12 DIAGNOSIS — Z20828 Contact with and (suspected) exposure to other viral communicable diseases: Secondary | ICD-10-CM | POA: Diagnosis not present

## 2019-07-12 DIAGNOSIS — Z01812 Encounter for preprocedural laboratory examination: Secondary | ICD-10-CM | POA: Insufficient documentation

## 2019-07-13 HISTORY — PX: KIDNEY SURGERY: SHX687

## 2019-07-13 LAB — SARS CORONAVIRUS 2 (TAT 6-24 HRS): SARS Coronavirus 2: NEGATIVE

## 2019-07-16 ENCOUNTER — Encounter
Admission: RE | Disposition: A | Discharge status: Home / Self Care | Payer: Self-pay | Source: Home / Self Care | Attending: Urology

## 2019-07-16 ENCOUNTER — Other Ambulatory Visit: Payer: Self-pay

## 2019-07-16 ENCOUNTER — Ambulatory Visit: Payer: Medicare Other

## 2019-07-16 ENCOUNTER — Ambulatory Visit: Payer: Medicare Other | Admitting: Certified Registered Nurse Anesthetist

## 2019-07-16 ENCOUNTER — Ambulatory Visit
Admission: RE | Admit: 2019-07-16 | Discharge: 2019-07-16 | Disposition: A | Discharge status: Home / Self Care | Payer: Medicare Other | Attending: Urology | Admitting: Urology

## 2019-07-16 DIAGNOSIS — Z87891 Personal history of nicotine dependence: Secondary | ICD-10-CM | POA: Insufficient documentation

## 2019-07-16 DIAGNOSIS — E7201 Cystinuria: Secondary | ICD-10-CM | POA: Diagnosis not present

## 2019-07-16 DIAGNOSIS — I110 Hypertensive heart disease with heart failure: Secondary | ICD-10-CM | POA: Diagnosis not present

## 2019-07-16 DIAGNOSIS — I2510 Atherosclerotic heart disease of native coronary artery without angina pectoris: Secondary | ICD-10-CM | POA: Insufficient documentation

## 2019-07-16 DIAGNOSIS — Z87442 Personal history of urinary calculi: Secondary | ICD-10-CM | POA: Diagnosis not present

## 2019-07-16 DIAGNOSIS — I739 Peripheral vascular disease, unspecified: Secondary | ICD-10-CM | POA: Insufficient documentation

## 2019-07-16 DIAGNOSIS — N200 Calculus of kidney: Secondary | ICD-10-CM | POA: Insufficient documentation

## 2019-07-16 DIAGNOSIS — J449 Chronic obstructive pulmonary disease, unspecified: Secondary | ICD-10-CM | POA: Diagnosis not present

## 2019-07-16 DIAGNOSIS — I509 Heart failure, unspecified: Secondary | ICD-10-CM | POA: Diagnosis not present

## 2019-07-16 DIAGNOSIS — Z9049 Acquired absence of other specified parts of digestive tract: Secondary | ICD-10-CM | POA: Insufficient documentation

## 2019-07-16 HISTORY — PX: CYSTOSCOPY/URETEROSCOPY/HOLMIUM LASER/STENT PLACEMENT: SHX6546

## 2019-07-16 SURGERY — CYSTOSCOPY/URETEROSCOPY/HOLMIUM LASER/STENT PLACEMENT
Anesthesia: General | Site: Ureter | Laterality: Left

## 2019-07-16 MED ORDER — PHENYLEPHRINE HCL (PRESSORS) 10 MG/ML IV SOLN
INTRAVENOUS | Status: DC | PRN
  Administered 2019-07-16: 100 ug via INTRAVENOUS

## 2019-07-16 MED ORDER — PROMETHAZINE HCL 25 MG/ML IJ SOLN: 6.2500 mg | INTRAMUSCULAR | Status: DC | PRN

## 2019-07-16 MED ORDER — SUGAMMADEX SODIUM 200 MG/2ML IV SOLN
INTRAVENOUS | Status: DC | PRN
  Administered 2019-07-16: 200 mg via INTRAVENOUS

## 2019-07-16 MED ORDER — GLYCOPYRROLATE 0.2 MG/ML IJ SOLN
INTRAMUSCULAR | Status: DC | PRN
  Administered 2019-07-16: .2 mg via INTRAVENOUS

## 2019-07-16 MED ORDER — EPHEDRINE SULFATE 50 MG/ML IJ SOLN
INTRAMUSCULAR | Status: DC | PRN
  Administered 2019-07-16: 10 mg via INTRAVENOUS

## 2019-07-16 MED ORDER — ROCURONIUM BROMIDE 100 MG/10ML IV SOLN
INTRAVENOUS | Status: DC | PRN
  Administered 2019-07-16: 30 mg via INTRAVENOUS

## 2019-07-16 MED ORDER — SUGAMMADEX SODIUM 200 MG/2ML IV SOLN
INTRAVENOUS | Status: AC
  Filled 2019-07-16: qty 2

## 2019-07-16 MED ORDER — FAMOTIDINE 20 MG PO TABS
ORAL_TABLET | ORAL | Status: AC
  Filled 2019-07-16: qty 1

## 2019-07-16 MED ORDER — DEXAMETHASONE SODIUM PHOSPHATE 10 MG/ML IJ SOLN
INTRAMUSCULAR | Status: DC | PRN
  Administered 2019-07-16: 4 mg via INTRAVENOUS

## 2019-07-16 MED ORDER — FAMOTIDINE 20 MG PO TABS
20.0000 mg | ORAL_TABLET | Freq: Once | ORAL | Status: AC
  Administered 2019-07-16: 20 mg via ORAL

## 2019-07-16 MED ORDER — PROPOFOL 10 MG/ML IV BOLUS
INTRAVENOUS | Status: DC | PRN
  Administered 2019-07-16: 130 mg via INTRAVENOUS

## 2019-07-16 MED ORDER — ONDANSETRON HCL 4 MG/2ML IJ SOLN
INTRAMUSCULAR | Status: DC | PRN
  Administered 2019-07-16: 4 mg via INTRAVENOUS

## 2019-07-16 MED ORDER — MIDAZOLAM HCL 2 MG/2ML IJ SOLN
INTRAMUSCULAR | Status: DC | PRN
  Administered 2019-07-16: 2 mg via INTRAVENOUS

## 2019-07-16 MED ORDER — MIDAZOLAM HCL 2 MG/2ML IJ SOLN
INTRAMUSCULAR | Status: AC
  Filled 2019-07-16: qty 2

## 2019-07-16 MED ORDER — FENTANYL CITRATE (PF) 100 MCG/2ML IJ SOLN
INTRAMUSCULAR | Status: DC | PRN
  Administered 2019-07-16: 50 ug via INTRAVENOUS

## 2019-07-16 MED ORDER — FENTANYL CITRATE (PF) 100 MCG/2ML IJ SOLN
INTRAMUSCULAR | Status: AC
  Filled 2019-07-16: qty 2

## 2019-07-16 MED ORDER — PROPOFOL 10 MG/ML IV BOLUS
INTRAVENOUS | Status: AC
  Filled 2019-07-16: qty 40

## 2019-07-16 MED ORDER — CEFAZOLIN SODIUM-DEXTROSE 1-4 GM/50ML-% IV SOLN
1.0000 g | INTRAVENOUS | Status: AC
  Administered 2019-07-16: 1 g via INTRAVENOUS

## 2019-07-16 MED ORDER — LACTATED RINGERS IV SOLN: INTRAVENOUS | Status: DC

## 2019-07-16 MED ORDER — FENTANYL CITRATE (PF) 100 MCG/2ML IJ SOLN: 25.0000 ug | INTRAMUSCULAR | Status: DC | PRN

## 2019-07-16 MED ORDER — LIDOCAINE HCL (CARDIAC) PF 100 MG/5ML IV SOSY
PREFILLED_SYRINGE | INTRAVENOUS | Status: DC | PRN
  Administered 2019-07-16: 100 mg via INTRAVENOUS

## 2019-07-16 MED ORDER — CEFAZOLIN SODIUM-DEXTROSE 1-4 GM/50ML-% IV SOLN
INTRAVENOUS | Status: AC
  Filled 2019-07-16: qty 50

## 2019-07-16 MED ORDER — MEPERIDINE HCL 50 MG/ML IJ SOLN: 6.2500 mg | INTRAMUSCULAR | Status: DC | PRN

## 2019-07-16 SURGICAL SUPPLY — 30 items
BAG DRAIN CYSTO-URO LG1000N (MISCELLANEOUS) ×2 IMPLANT
BASKET ZERO TIP 1.9FR (BASKET) ×1 IMPLANT
BRUSH SCRUB EZ 1% IODOPHOR (MISCELLANEOUS) ×2 IMPLANT
BSKT STON RTRVL ZERO TP 1.9FR (BASKET) ×1
CATH URETL 5X70 OPEN END (CATHETERS) ×2 IMPLANT
CNTNR SPEC 2.5X3XGRAD LEK (MISCELLANEOUS)
CONT SPEC 4OZ STER OR WHT (MISCELLANEOUS)
CONT SPEC 4OZ STRL OR WHT (MISCELLANEOUS)
CONTAINER SPEC 2.5X3XGRAD LEK (MISCELLANEOUS) IMPLANT
DRAPE UTILITY 15X26 TOWEL STRL (DRAPES) ×2 IMPLANT
FIBER LASER TRAC TIP (UROLOGICAL SUPPLIES) IMPLANT
GLOVE BIO SURGEON STRL SZ 6.5 (GLOVE) ×2 IMPLANT
GOWN STRL REUS W/ TWL LRG LVL3 (GOWN DISPOSABLE) ×2 IMPLANT
GOWN STRL REUS W/TWL LRG LVL3 (GOWN DISPOSABLE) ×4
GUIDEWIRE GREEN .038 145CM (MISCELLANEOUS) ×1 IMPLANT
GUIDEWIRE STR DUAL SENSOR (WIRE) ×2 IMPLANT
INFUSOR MANOMETER BAG 3000ML (MISCELLANEOUS) ×2 IMPLANT
INTRODUCER DILATOR DOUBLE (INTRODUCER) ×1 IMPLANT
KIT TURNOVER CYSTO (KITS) ×2 IMPLANT
LASER FIBER FLEXIVA 365 (UROLOGICAL SUPPLIES) ×1 IMPLANT
PACK CYSTO AR (MISCELLANEOUS) ×2 IMPLANT
SET CYSTO W/LG BORE CLAMP LF (SET/KITS/TRAYS/PACK) ×2 IMPLANT
SHEATH URETERAL 12FRX35CM (MISCELLANEOUS) ×1 IMPLANT
SOL .9 NS 3000ML IRR  AL (IV SOLUTION) ×1
SOL .9 NS 3000ML IRR AL (IV SOLUTION) ×1
SOL .9 NS 3000ML IRR UROMATIC (IV SOLUTION) ×1 IMPLANT
STENT URET 6FRX24 CONTOUR (STENTS) ×1 IMPLANT
STENT URET 6FRX26 CONTOUR (STENTS) IMPLANT
SURGILUBE 2OZ TUBE FLIPTOP (MISCELLANEOUS) ×2 IMPLANT
WATER STERILE IRR 1000ML POUR (IV SOLUTION) ×2 IMPLANT

## 2019-07-16 NOTE — H&P (Signed)
07/16/19  Updated today without change RRR CTAB   [name]  [birth year]  [id number]     CC:  Left partial staghorn calculus, s/p staged PCNL      HPI:  [name] is a 64 y.o. male who presents for management of known left partial staghorn calculus, s/p staged PCNL with Dr. Erlene Quan on 05/28/2019.  Procedure was uncomplicated with removal of approximately 95% of his left-sided stone burden.  His left nephrostomy tube and Foley catheter were discontinued on POD 1 without incident.  A left ureteral stent remains in place. Stone analysis from this procedure ultimately resulted with 100% cystine composition, consistent with prior stone analyses.     In the interim, patient reports feeling well.  He denies flank pain, dysuria, and gross hematuria.  He has no acute concerns today.     PMH:       Past Medical History:    Diagnosis   Date    .   Arthritis            knees, lower back    .   Bronchitis        .   Cardiomyopathy ([place])        .   CHF (congestive heart failure) ([place])        .   Chronic back pain        .   Complication of anesthesia            has trouble keeping pt asleep due to taking narcotics qid    .   Coronary artery disease        .   Hepatitis C virus infection without hepatic coma            received treatment. clear now    .   History of kidney stones        .   Hypertension              Surgical History:        Past Surgical History:    Procedure   Laterality   Date    .   BACK SURGERY            .   CARDIAC CATHETERIZATION            .   CHOLECYSTECTOMY            .   COLON RESECTION       2007        due to diverticulitis    .   COLONOSCOPY WITH PROPOFOL   N/A   03/29/2017        Procedure: COLONOSCOPY WITH PROPOFOL;  Surgeon: Lucilla Lame, MD;  Location: [facility] ENDOSCOPY;  Service: Endoscopy;   Laterality: N/A;    .   ESOPHAGOGASTRODUODENOSCOPY (EGD) WITH PROPOFOL   N/A   03/29/2017        Procedure: ESOPHAGOGASTRODUODENOSCOPY (EGD) WITH PROPOFOL;  Surgeon: Lucilla Lame, MD;  Location: [facility] ENDOSCOPY;  Service: Endoscopy;  Laterality: N/A;    .   FINGER SURGERY       2007        cut finger off with saw and was reattached    .   HERNIA REPAIR            .   HOLMIUM LASER APPLICATION   N/A   [id number]        Procedure: HOLMIUM LASER APPLICATION;  Surgeon: Hollice Espy, MD;  Location: [facility]  ORS;  Service: Urology;  Laterality: N/A;    .   IR NEPHROSTOMY PLACEMENT LEFT       05/28/2019    .   KNEE ARTHROSCOPY   Bilateral        .   LOWER EXTREMITY ANGIOGRAPHY   Right   11/09/2017        Procedure: LOWER EXTREMITY ANGIOGRAPHY;  Surgeon: Katha Cabal, MD;  Location: [facility] CV LAB;  Service: Cardiovascular;  Laterality: Right;    .   LOWER EXTREMITY ANGIOGRAPHY   Right   11/21/2018        Procedure: LOWER EXTREMITY ANGIOGRAPHY;  Surgeon: Katha Cabal, MD;  Location: [facility] CV LAB;  Service: Cardiovascular;  Laterality: Right;    .   NEPHROLITHOTOMY   Left   05/28/2019        Procedure: NEPHROLITHOTOMY PERCUTANEOUS;  Surgeon: Hollice Espy, MD;  Location: [facility] ORS;  Service: Urology;  Laterality: Left;    .   SPINAL FUSION                  Home Medications:         Allergies as of 06/13/2019                 Reactions         Codeine   Itching        Dilaudid [hydromorphone]   Other (See Comments)        Severe headache                    Medication List                (suggestion) Accurate as of June 13, 2019 10:39 AM. If you have any questions, ask your nurse or doctor.                   STOP taking these medications      oxybutynin 5 MG tablet  Commonly known as: DITROPAN  Stopped  by: Debroah Loop, PA-C              TAKE these medications      ALPRAZolam 1 MG tablet  Commonly known as: XANAX  Take 1 mg by mouth 3 (three) times daily as needed for anxiety.       carvedilol 25 MG tablet  Commonly known as: COREG  Take 25 mg by mouth 2 (two) times daily.       clopidogrel 75 MG tablet  Commonly known as: Plavix  Take 1 tablet (75 mg total) by mouth daily.       docusate sodium 100 MG capsule  Commonly known as: COLACE  Take 1 capsule (100 mg total) by mouth 2 (two) times daily.       FLUoxetine 40 MG capsule  Commonly known as: PROZAC  Take 40 mg by mouth every morning.       fluticasone 50 MCG/ACT nasal spray  Commonly known as: FLONASE  Place 1 spray into both nostrils daily as needed for allergies.       furosemide 40 MG tablet  Commonly known as: LASIX  Take 40 mg by mouth daily as needed (fluid retention.).       ipratropium-albuterol 0.5-2.5 (3) MG/3ML Soln  Commonly known as: DUONEB  Take 3 mLs by nebulization every 4 (four) hours as needed (for SOB).       losartan 50 MG tablet  Commonly known as: COZAAR  Take 50 mg by mouth every morning.       magnesium hydroxide 400 MG/5ML suspension  Commonly known as: MILK OF MAGNESIA  Take 15 mLs by mouth daily as needed for mild constipation or indigestion.       meloxicam 15 MG tablet  Commonly known as: MOBIC  Take 15 mg by mouth daily as needed (for inflammation.).       oxyCODONE 15 MG immediate release tablet  Commonly known as: ROXICODONE  Take 15 mg by mouth 4 (four) times daily.       rosuvastatin 40 MG tablet  Commonly known as: CRESTOR  Take 40 mg by mouth every evening.       spironolactone 25 MG tablet  Commonly known as: ALDACTONE  Take 25 mg by mouth daily.       tamsulosin 0.4 MG Caps capsule  Commonly known as: FLOMAX  Take 1 capsule (0.4 mg total) by mouth daily.                    Allergies:         Allergies    Allergen   Reactions    .   Codeine   Itching    .   Dilaudid [Hydromorphone]   Other (See Comments)            Severe headache          Family History:        Family History    Problem   Relation   Age of Onset    .   COPD   Mother              Social History:  reports that he quit smoking about 7 years ago. His smoking use included cigarettes. He has a 25.00 pack-year smoking history. He has never used smokeless tobacco. He reports that he does not drink alcohol or use drugs.     ROS:  UROLOGY  Frequent Urination?: Yes  Hard to postpone urination?: No  Burning/pain with urination?: No  Get up at night to urinate?: Yes  Leakage of urine?: No  Urine stream starts and stops?: No  Trouble starting stream?: No  Do you have to strain to urinate?: No  Blood in urine?: No  Urinary tract infection?: No  Sexually transmitted disease?: No  Injury to kidneys or bladder?: No  Painful intercourse?: No  Weak stream?: No  Erection problems?: No  Penile pain?: No     Gastrointestinal  Nausea?: No  Vomiting?: No  Indigestion/heartburn?: No  Diarrhea?: No  Constipation?: No     Constitutional  Fever: No  Night sweats?: No  Weight loss?: No  Fatigue?: No     Skin  Skin rash/lesions?: No  Itching?: No     Eyes  Blurred vision?: No  Double vision?: No     Ears/Nose/Throat  Sore throat?: No  Sinus problems?: No     Hematologic/Lymphatic  Swollen glands?: No  Easy bruising?: No     Cardiovascular  Leg swelling?: No  Chest pain?: No     Respiratory  Cough?: No  Shortness of breath?: No     Endocrine  Excessive thirst?: No     Musculoskeletal  Back pain?: Yes  Joint pain?: Yes     Neurological  Headaches?: No  Dizziness?: No     Psychologic  Depression?: No  Anxiety?: No     Physical  Exam:  BP 112/66   Pulse (!) 52  Ht 5\' 7"  (1.702 m)   Wt 153 lb (69.4 kg)   BMI 23.96 kg/m    Constitutional:  Alert and oriented, No acute distress.  HEENT: [place] AT, moist mucus membranes.  Trachea midline, no masses.  Cardiovascular: No clubbing, cyanosis, or edema.  Respiratory: Normal respiratory effort, no increased work of breathing.  Skin: No rashes, bruises or suspicious lesions.  Neurologic: Grossly intact, no focal deficits, moving all 4 extremities.  Psychiatric: Normal mood and affect.     Laboratory Data:        Results for orders placed or performed during the hospital encounter of 05/28/19    Calculi, with Photograph (to Clinical Lab)    Result   Value   Ref Range        Source Calculi   Comment            Color Calculi   Orange            Size Calculi   4x6   mm        Weight Calculi   2,105   mg        Composition Calculi   Comment            Cystine Calculi   100   %        Photo Calculi   Comment            Comment Calculi 3   Comment            Please Note:   Comment            DISCLAIMER:   Comment           Assessment & Plan:    1. Staghorn calculus  Today we discussed the plan to complete his staged procedure, this time with a ureteroscopic approach with laser lithotripsy and stent exchange.  We discussed the risks and benefits of this procedure including bleeding, infection, damage to surrounding structures, efficacy with need for possible further intervention, and need for temporary ureteral stent.  Patient expressed understanding and denied having questions at this time.     He previously received cardiac clearance in advance of his PCNL; I do not suspect he will have to repeat this.  He does take Plavix; he will need to hold this again in advance of procedure.  He has been prescribed meloxicam previously, however states he has not taken this in quite  some time.  I informed him that he should hold this as well in advance of procedure.     Next available surgery date with Dr. Erlene Quan is in approximately 1 month; advised patient that our surgical scheduler would contact him with further instructions.  I would like him to follow-up with Dr. Erlene Quan 1 month after this procedure with renal ultrasound prior to discuss possible intervention of his right-sided staghorn stone.     Debroah Loop, PA-C     [facility]  [address] [place]  [place], [place] [address]  [phone]             Electronically signed by Debroah Loop, PA-C at 06/13/2019 10:55 AM

## 2019-07-16 NOTE — Transfer of Care (Signed)
Immediate Anesthesia Transfer of Care Note  Patient: [name]  Procedure(s) Performed: CYSTOSCOPY/URETEROSCOPY/HOLMIUM LASER/STENT Exchange (Left Ureter)  Patient Location: PACU  Anesthesia Type:General  Level of Consciousness: awake, alert , oriented and patient cooperative  Airway & Oxygen Therapy: Patient Spontanous Breathing  Post-op Assessment: Report given to RN  Post vital signs: Reviewed and stable  Last Vitals:  Vitals Value Taken Time  BP 137/69 07/16/19 1209  Temp 36.1 C 07/16/19 1209  Pulse 63 07/16/19 1212  Resp 24 07/16/19 1212  SpO2 97 % 07/16/19 1212  Vitals shown include unvalidated device data.  Last Pain:  Vitals:   07/16/19 1007  TempSrc: Temporal  PainSc: 7          Complications: No apparent anesthesia complications

## 2019-07-16 NOTE — Discharge Instructions (Signed)
You have a ureteral stent in place.  This is a tube that extends from your kidney to your bladder.  This may cause urinary bleeding, burning with urination, and urinary frequency.  Please call our office or present to the ED if you develop fevers >101 or pain which is not able to be controlled with oral pain medications.  You may be given either Flomax and/ or ditropan to help with bladder spasms and stent pain in addition to pain medications.   Your stent is on a string that is taped to the head of your penis.  He may untaped this on Wednesday and pulled gently until the entire stent is removed.  If you have any issues or concerns, please call our office.  [place] [address] [place], [place] [address] [phone]

## 2019-07-16 NOTE — Anesthesia Preprocedure Evaluation (Signed)
Anesthesia Evaluation  Patient identified by MRN, date of birth, ID band Patient awake    Reviewed: Allergy & Precautions, NPO status , Patient's Chart, lab work & pertinent test results  History of Anesthesia Complications Negative for: history of anesthetic complications  Airway Mallampati: III  TM Distance: >3 FB Neck ROM: Full    Dental  (+) Poor Dentition   Pulmonary neg sleep apnea, COPD,  COPD inhaler, former smoker,    breath sounds clear to auscultation- rhonchi (-) wheezing      Cardiovascular hypertension, + CAD, + Peripheral Vascular Disease and +CHF  (-) Past MI, (-) Cardiac Stents and (-) CABG  Rhythm:Regular Rate:Normal - Systolic murmurs and - Diastolic murmurs    Neuro/Psych neg Seizures negative neurological ROS  negative psych ROS   GI/Hepatic negative GI ROS, (+) Hepatitis -, C  Endo/Other  negative endocrine ROSneg diabetes  Renal/GU Renal disease: nephrolithiasis.     Musculoskeletal  (+) Arthritis ,   Abdominal (+) - obese,   Peds  Hematology negative hematology ROS (+)   Anesthesia Other Findings Past Medical History: No date: Arthritis     Comment:  knees, lower back No date: Bronchitis No date: Cardiomyopathy ([place]) No date: CHF (congestive heart failure) (HCC) No date: Chronic back pain No date: Complication of anesthesia     Comment:  has trouble keeping pt asleep due to taking narcotics               qid No date: Coronary artery disease No date: Diverticulitis No date: Hepatitis C virus infection without hepatic coma     Comment:  received treatment. clear now No date: History of kidney stones No date: Hypertension   Reproductive/Obstetrics                             Anesthesia Physical Anesthesia Plan  ASA: III  Anesthesia Plan: General   Post-op Pain Management:    Induction: Intravenous  PONV Risk Score and Plan: 1 and Ondansetron and  Dexamethasone  Airway Management Planned: Oral ETT  Additional Equipment:   Intra-op Plan:   Post-operative Plan: Extubation in OR  Informed Consent: I have reviewed the patients History and Physical, chart, labs and discussed the procedure including the risks, benefits and alternatives for the proposed anesthesia with the patient or authorized representative who has indicated his/her understanding and acceptance.     Dental advisory given  Plan Discussed with: CRNA and Anesthesiologist  Anesthesia Plan Comments:         Anesthesia Quick Evaluation

## 2019-07-16 NOTE — Anesthesia Procedure Notes (Signed)
Procedure Name: Intubation Date/Time: 07/16/2019 11:26 AM Performed by: Aline Brochure, CRNA Pre-anesthesia Checklist: Patient identified, Emergency Drugs available, Suction available and Patient being monitored Patient Re-evaluated:Patient Re-evaluated prior to induction Oxygen Delivery Method: Circle system utilized Preoxygenation: Pre-oxygenation with 100% oxygen Induction Type: IV induction Ventilation: Mask ventilation without difficulty Laryngoscope Size: Mac and 4 Grade View: Grade I Tube type: Oral Tube size: 7.5 mm Number of attempts: 1 Airway Equipment and Method: Stylet Placement Confirmation: positive ETCO2,  ETT inserted through vocal cords under direct vision and breath sounds checked- equal and bilateral Secured at: 23 cm Tube secured with: Tape Dental Injury: Teeth and Oropharynx as per pre-operative assessment

## 2019-07-16 NOTE — Anesthesia Postprocedure Evaluation (Signed)
Anesthesia Post Note  Patient: [name]  Procedure(s) Performed: CYSTOSCOPY/URETEROSCOPY/HOLMIUM LASER/STENT Exchange (Left Ureter)  Patient location during evaluation: PACU Anesthesia Type: General Level of consciousness: awake and alert and oriented Pain management: pain level controlled Vital Signs Assessment: post-procedure vital signs reviewed and stable Respiratory status: spontaneous breathing, nonlabored ventilation and respiratory function stable Cardiovascular status: blood pressure returned to baseline and stable Postop Assessment: no signs of nausea or vomiting Anesthetic complications: no     Last Vitals:  Vitals:   07/16/19 1237 07/16/19 1246  BP: 125/65 131/60  Pulse: 61 60  Resp: 18 20  Temp:  (!) 36.1 C  SpO2: 95% 98%    Last Pain:  Vitals:   07/16/19 1246  TempSrc: Temporal  PainSc: 0-No pain                 Dakotah Orrego

## 2019-07-16 NOTE — Op Note (Signed)
Date of procedure: 07/16/19  Preoperative diagnosis:  1. Cystinuria 2. 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 Left kidney stone  Postoperative diagnosis:  1. Same as above  Procedure: 1. Cystoscopy 2. Left retrograde pyelogram 3. Left ureteroscopy with laser lithotripsy 4. Left ureteral stent exchange  Surgeon: Hollice Espy, MD  Anesthesia: General  Complications: None  Intraoperative findings: Mid pole stone treated and all significant fragment removed.  No other stone burden identified.  Stent exchange on tether.  EBL: Minimal  Specimens: None, stones previously sent  Drains: 6 x 24 French double-J ureteral stent on left with tether  Indication: [name] is a 64 y.o. 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  After reviewing the management options for treatment, he elected to proceed with the above surgical procedure(s). We have discussed the potential benefits and risks of the procedure, side effects of the proposed treatment, the likelihood of the patient achieving the goals of the procedure, and any potential problems that might occur during the procedure or recuperation. Informed consent has been obtained.  Description of procedure:  The patient was taken to the operating room and general anesthesia was induced.  The patient was placed in the dorsal lithotomy position, prepped and draped in the usual sterile fashion, and preoperative antibiotics were administered. A preoperative time-out was performed.   A 21 French cystoscope was advanced per urethra into the bladder.  Attention was turned to the left ureter orifice which a ureteral stent was seen emanating.  The distal coil of the stent was grasped and brought to the level of the urethral meatus.  Notably, the patient had a incidental subcoronal hypospadias.  The stent was then cannulated  using a Super Stiff wire up to the level of the kidney.  The wire was left in place and the stent was removed.  A dual-lumen catheter was used to introduce a sensor wire up to the level of the kidney which was used as a safety wire.  This was snapped in place.  A 45 cm Cook ureteral access sheath was advanced to the level of the proximal ureter under fluoroscopic guidance over the Super Stiff wire.  The inner lumen and Super Stiff wire were then removed.  A dual-lumen 8 [place] digital flexible ureteroscope was advanced into the kidney.  The kidney was careful inspected.  There is a calcification noted within the midpole calyx which correlated with the findings on his most recent CT scan.  The stone was encountered occupying the entire calyx.  No other stone burden was identified.  The upper pole remains slightly dilated and the lower pole was free of any stone burden.  The stone burden was relatively low overall.  At this point in time, 365 m laser fiber was brought in using the settings of 0.2 J of 40 Hz, the stone was fragmented into tiny pieces.  A number of the larger pieces were basket extracted using a 1.9 [place] tipless nitinol basket.  Once there is no significant residual stone burden, a final retrograde pyelogram was performed by injecting contrast through the scope.  This created a roadmap to ensure the each and every calyx was visualized.  No significant residual stone burden remained.  There is no extravasation.  The scope and access sheath was removed inspecting the ureter along the way.  There were no ureteral injuries appreciated and the ureter itself  was widely patent.  The safety wire was then backloaded over a rigid cystoscope.  A 6 x 24 French double-J ureteral stent was advanced over the safety wire up to level the renal pelvis.  The wire was partially withdrawn until full coil was noted both within the renal pelvis as well as within the bladder.  The stent string was left affixed to the  distal coil.  The stent position was confirmed under fluoroscopy which was appropriate.  The stent string was affixed to the patient's glans using Mastisol and Tegaderm and the dorsal hood of his foreskin was replaced.  He was then repositioned supine position, reversed anesthesia, and taken to the PACU in stable condition.  Plan: He may remove his own stent in 2 days.  He will follow-up in 4 weeks with renal ultrasound prior.     Hollice Espy, M.D.

## 2019-08-01 ENCOUNTER — Ambulatory Visit: Payer: Medicare Other

## 2019-08-07 ENCOUNTER — Other Ambulatory Visit: Payer: Self-pay

## 2019-08-07 ENCOUNTER — Ambulatory Visit
Admission: RE | Admit: 2019-08-07 | Discharge: 2019-08-07 | Disposition: A | Discharge status: Home / Self Care | Payer: Medicare Other | Source: Ambulatory Visit | Attending: Physician Assistant | Admitting: Physician Assistant

## 2019-08-07 DIAGNOSIS — N200 Calculus of kidney: Secondary | ICD-10-CM | POA: Insufficient documentation

## 2019-08-08 ENCOUNTER — Encounter: Payer: Self-pay | Admitting: Urology

## 2019-08-08 ENCOUNTER — Telehealth: Payer: Self-pay | Admitting: Urology

## 2019-08-08 ENCOUNTER — Ambulatory Visit: Payer: Medicare Other | Admitting: Urology

## 2019-08-08 NOTE — Telephone Encounter (Signed)
Patient informed, appointment scheduled. Voiced understanding.

## 2019-08-08 NOTE — Telephone Encounter (Signed)
Mr. Anchondo was a no-show today for his follow-up visit.  Please let him know that his renal ultrasound actually looked quite good on the left, the swelling/hydronephrosis is now gone.  There is a small calcification in the left kidney but it is probably a stone within the tract or embedded into the tissue.  He obviously still has significant stone burden on the right.  I would like him to reschedule to discuss management of his right side as well as stone prevention options.  Hollice Espy, MD

## 2019-08-14 ENCOUNTER — Telehealth (INDEPENDENT_AMBULATORY_CARE_PROVIDER_SITE_OTHER): Payer: Medicare Other | Admitting: Urology

## 2019-08-14 ENCOUNTER — Other Ambulatory Visit: Payer: Self-pay

## 2019-08-14 DIAGNOSIS — E7209 Other disorders of amino-acid transport: Secondary | ICD-10-CM

## 2019-08-14 DIAGNOSIS — N200 Calculus of kidney: Secondary | ICD-10-CM

## 2019-08-14 NOTE — Progress Notes (Signed)
Virtual Visit via Telephone Note  I connected with [name] on 08/14/19 at  9:00 AM EST by telephone and verified that I am speaking with the correct person using two identifiers.  Location: Patient: home Provider: office   I discussed the limitations, risks, security and privacy concerns of performing an evaluation and management service by telephone and the availability of in person appointments. I also discussed with the patient that there may be a patient responsible charge related to this service. The patient expressed understanding and agreed to proceed.   History of Present Illness: 64 year old male with a personal history of recurrent nephrolithiasis/cystine stones who presented with a partially obstructing left staghorn calculus as well as a partial staghorn calculus on the right.  In the interim, he is undergone left PCNL which was unremarkable followed by staged ureteroscopy.  His stent was ultimately removed.  He follows up today with renal ultrasound which is reassuring, personally reviewed.  Resolution of left hydronephrosis with minimal stone burden, stone likely in tract.  Right partial staghorn is visualized.  He denies any flank pain.  He is doing really well postoperatively without complications.  No significant urinary symptoms.  No flank pain or gross hematuria.   Observations/Objective: Interactive, pleasant  Assessment and Plan:  1. Staghorn calculus Status post successful left PCNL/ureteroscopy for obstructing staghorn calculus  Follow-up renal ultrasound shows resolution of hydronephrosis with likely calcification in the tract  Significant partial right staghorn calculus, discussed continued observation versus timing of right-sided PCNL.  He will follow-up with me in about 4 months to discuss this further  2. Cystine stones (HCC) Discussed 24 hour urine-would likely benefit from repeating the study and optimization of pharmacotherapy given his overall  stone burden and stone recurrence Believes he see's Dr. Aleene Davidson from nephrology Previously on Urocit K many years ago by Dr. Terance Hart Will reach out to nephrologist for further recs- message sent via epic today   Follow Up Instructions: 4 months discuss possible right stone surgery    I discussed the assessment and treatment plan with the patient. The patient was provided an opportunity to ask questions and all were answered. The patient agreed with the plan and demonstrated an understanding of the instructions.   The patient was advised to call back or seek an in-person evaluation if the symptoms worsen or if the condition fails to improve as anticipated.  I provided 14 minutes of non-face-to-face time during this encounter.   Hollice Espy, MD

## 2019-09-11 ENCOUNTER — Ambulatory Visit (INDEPENDENT_AMBULATORY_CARE_PROVIDER_SITE_OTHER): Payer: Medicare Other

## 2019-09-11 ENCOUNTER — Encounter (INDEPENDENT_AMBULATORY_CARE_PROVIDER_SITE_OTHER): Payer: Self-pay | Admitting: Nurse Practitioner

## 2019-09-11 ENCOUNTER — Ambulatory Visit (INDEPENDENT_AMBULATORY_CARE_PROVIDER_SITE_OTHER): Payer: Medicare Other | Admitting: Nurse Practitioner

## 2019-09-11 ENCOUNTER — Other Ambulatory Visit: Payer: Self-pay

## 2019-09-11 VITALS — BP 95/59 | HR 52 | Resp 14 | Ht 67.0 in | Wt 133.0 lb

## 2019-09-11 DIAGNOSIS — E782 Mixed hyperlipidemia: Secondary | ICD-10-CM | POA: Diagnosis not present

## 2019-09-11 DIAGNOSIS — I70211 Atherosclerosis of native arteries of extremities with intermittent claudication, right leg: Secondary | ICD-10-CM

## 2019-09-11 DIAGNOSIS — J42 Unspecified chronic bronchitis: Secondary | ICD-10-CM | POA: Diagnosis not present

## 2019-09-11 NOTE — Progress Notes (Signed)
SUBJECTIVE:  Patient ID: [name], male    DOB: 06-Dec-1955, 64 y.o.   MRN: [id number] Chief Complaint  Patient presents with  . Follow-up    ultrasound follow up     HPI  [name] is a 64 y.o. male The patient returns to the office for followup and review of the noninvasive studies. There has been a significant deterioration in the lower extremity symptoms.  The patient notes interval shortening of their claudication distance and development of mild rest pain symptoms. No new ulcers or wounds have occurred since the last visit.  There have been no significant changes to the patient's overall health care.  The patient denies amaurosis fugax or recent TIA symptoms. There are no recent neurological changes noted. The patient denies history of DVT, PE or superficial thrombophlebitis. The patient denies recent episodes of angina or shortness of breath.   ABI's Rt=0.64 and Lt=1.09 (previous ABI's Rt=0.89 and Lt=0.94) Duplex US of the lower extremity arterial system shows triphasic waveforms in the left tibial arteries with normal toe waveforms.  Arterial duplex of the right lower extremity reveals monophasic waveforms from the common femoral artery down to the distal tibial arteries.  The stent in the SFA appears patent.  Based on the noninvasive studies suggest some iliac level disease.  Past Medical History:  Diagnosis Date  . Arthritis    knees, lower back  . Bronchitis   . Cardiomyopathy ([place])   . CHF (congestive heart failure) ([place])   . Chronic back pain   . Complication of anesthesia    has trouble keeping pt asleep due to taking narcotics qid  . Coronary artery disease   . Diverticulitis   . Hepatitis C virus infection without hepatic coma    received treatment. clear now  . History of kidney stones   . Hypertension     Past Surgical History:  Procedure Laterality Date  . BACK SURGERY    . CARDIAC CATHETERIZATION    . CHOLECYSTECTOMY    . COLON RESECTION   2007   due to diverticulitis  . COLONOSCOPY WITH PROPOFOL N/A 03/29/2017   Procedure: COLONOSCOPY WITH PROPOFOL;  Surgeon: Lucilla Lame, MD;  Location: [facility] ENDOSCOPY;  Service: Endoscopy;  Laterality: N/A;  . CYSTOSCOPY/URETEROSCOPY/HOLMIUM LASER/STENT PLACEMENT Left 07/16/2019   Procedure: CYSTOSCOPY/URETEROSCOPY/HOLMIUM LASER/STENT Exchange;  Surgeon: Hollice Espy, MD;  Location: [facility] ORS;  Service: Urology;  Laterality: Left;  . ESOPHAGOGASTRODUODENOSCOPY (EGD) WITH PROPOFOL N/A 03/29/2017   Procedure: ESOPHAGOGASTRODUODENOSCOPY (EGD) WITH PROPOFOL;  Surgeon: Lucilla Lame, MD;  Location: [facility] ENDOSCOPY;  Service: Endoscopy;  Laterality: N/A;  . FINGER SURGERY  2007   cut finger off with saw and was reattached  . HERNIA REPAIR    . HOLMIUM LASER APPLICATION N/A [id number]   Procedure: HOLMIUM LASER APPLICATION;  Surgeon: Hollice Espy, MD;  Location: [facility] ORS;  Service: Urology;  Laterality: N/A;  . IR NEPHROSTOMY PLACEMENT LEFT  05/28/2019  . KNEE ARTHROSCOPY Bilateral   . LOWER EXTREMITY ANGIOGRAPHY Right 11/09/2017   Procedure: LOWER EXTREMITY ANGIOGRAPHY;  Surgeon: Katha Cabal, MD;  Location: [place] [facility];  Service: Cardiovascular;  Laterality: Right;  . LOWER EXTREMITY ANGIOGRAPHY Right 11/21/2018   Procedure: LOWER EXTREMITY ANGIOGRAPHY;  Surgeon: Katha Cabal, MD;  Location: [place] [facility];  Service: Cardiovascular;  Laterality: Right;  . NEPHROLITHOTOMY Left 05/28/2019   Procedure: NEPHROLITHOTOMY PERCUTANEOUS;  Surgeon: Hollice Espy, MD;  Location: [facility] ORS;  Service: Urology;  Laterality: Left;  . SPINAL FUSION  Social History   Socioeconomic History  . Marital status: Married    Spouse name: Not on file  . Number of children: Not on file  . Years of education: Not on file  . Highest education level: Not on file  Occupational History  . Not on file  Tobacco Use  . Smoking status: Former Smoker    Packs/day: 1.00    Years: 25.00     Pack years: 25.00    Types: Cigarettes    Quit date: 2013    Years since quitting: 8.1  . Smokeless tobacco: Never Used  Substance and Sexual Activity  . Alcohol use: No  . Drug use: No  . Sexual activity: Not on file  Other Topics Concern  . Not on file  Social History Narrative  . Not on file   Social Determinants of Health   Financial Resource Strain:   . Difficulty of Paying Living Expenses: Not on file  Food Insecurity:   . Worried About Charity fundraiser in the Last Year: Not on file  . Ran Out of Food in the Last Year: Not on file  Transportation Needs:   . Lack of Transportation (Medical): Not on file  . Lack of Transportation (Non-Medical): Not on file  Physical Activity:   . Days of Exercise per Week: Not on file  . Minutes of Exercise per Session: Not on file  Stress:   . Feeling of Stress : Not on file  Social Connections:   . Frequency of Communication with Friends and Family: Not on file  . Frequency of Social Gatherings with Friends and Family: Not on file  . Attends Religious Services: Not on file  . Active Member of Clubs or Organizations: Not on file  . Attends Archivist Meetings: Not on file  . Marital Status: Not on file  Intimate Partner Violence:   . Fear of Current or Ex-Partner: Not on file  . Emotionally Abused: Not on file  . Physically Abused: Not on file  . Sexually Abused: Not on file    Family History  Problem Relation Age of Onset  . COPD Mother     Allergies  Allergen Reactions  . Codeine Itching  . Dilaudid [Hydromorphone] Other (See Comments)    Severe headache     Review of Systems   Review of Systems: Negative Unless Checked Constitutional: [] Weight loss  [] Fever  [] Chills Cardiac: [] Chest pain   []  Atrial Fibrillation  [] Palpitations   [] Shortness of breath when laying flat   [] Shortness of breath with exertion. [] Shortness of breath at rest Vascular:  [] Pain in legs with walking   [] Pain in legs with  standing [] Pain in legs when laying flat   [x] Claudication    [] Pain in feet when laying flat    [] History of DVT   [] Phlebitis   [] Swelling in legs   [x] Varicose veins   [] Non-healing ulcers Pulmonary:   [] Uses home oxygen   [] Productive cough   [] Hemoptysis   [] Wheeze  [x] COPD   [] Asthma Neurologic:  [] Dizziness   [] Seizures  [] Blackouts [] History of stroke   [] History of TIA  [] Aphasia   [] Temporary Blindness   [] Weakness or numbness in arm   [] Weakness or numbness in leg Musculoskeletal:   [] Joint swelling   [] Joint pain   [] Low back pain  []  History of Knee Replacement [x] Arthritis [] back Surgeries  []  Spinal Stenosis    Hematologic:  [] Easy bruising  [] Easy bleeding   [] Hypercoagulable state   []   Anemic Gastrointestinal:  [] Diarrhea   [] Vomiting  [] Gastroesophageal reflux/heartburn   [] Difficulty swallowing. [] Abdominal pain Genitourinary:  [] Chronic kidney disease   [] Difficult urination  [] Anuric   [] Blood in urine [] Frequent urination  [] Burning with urination   [] Hematuria Skin:  [] Rashes   [] Ulcers [] Wounds Psychological:  [] History of anxiety   []  History of major depression  []  Memory Difficulties      OBJECTIVE:   Physical Exam  BP (!) 95/59 (BP Location: Right Arm)   Pulse (!) 52   Resp 14   Ht 5\' 7"  (1.702 m)   Wt 133 lb (60.3 kg)   BMI 20.83 kg/m   Gen: WD/WN, NAD Head: [place]/AT, No temporalis wasting.  Ear/Nose/Throat: Hearing grossly intact, nares w/o erythema or drainage Eyes: PER, EOMI, sclera nonicteric.  Neck: Supple, no masses.  No JVD.  Pulmonary:  Good air movement, no use of accessory muscles.  Cardiac: RRR Vascular:  Vessel Right Left  Radial Palpable Palpable  Dorsalis Pedis Not Palpable Palpable  Posterior Tibial Not Palpable Palpable   Gastrointestinal: soft, non-distended. No guarding/no peritoneal signs.  Musculoskeletal: M/S 5/5 throughout.  No deformity or atrophy.  Neurologic: Pain and light touch intact in extremities.  Symmetrical.  Speech is  fluent. Motor exam as listed above. Psychiatric: Judgment intact, Mood & affect appropriate for pt's clinical situation. Dermatologic: No Venous rashes. No Ulcers Noted.  No changes consistent with cellulitis. Lymph : No Cervical lymphadenopathy, no lichenification or skin changes of chronic lymphedema.       ASSESSMENT AND PLAN:  1. Atherosclerosis of native artery of right lower extremity with intermittent claudication (HCC) Recommend:  The patient has experienced increased symptoms and is now describing lifestyle limiting claudication and mild rest pain.   Given the severity of the patient's lower extremity symptoms the patient should undergo angiography and intervention.  Risk and benefits were reviewed the patient.  Indications for the procedure were reviewed.  All questions were answered, the patient agrees to proceed.   The patient should continue walking and begin a more formal exercise program.  The patient should continue antiplatelet therapy and aggressive treatment of the lipid abnormalities  The patient will follow up with me after the angiogram.   2. Chronic bronchitis, unspecified chronic bronchitis type ([place]) Continue pulmonary medications and aerosols as already ordered, these medications have been reviewed and there are no changes at this time.    3. Mixed hyperlipidemia Continue statin as ordered and reviewed, no changes at this time    Current Outpatient Medications on File Prior to Visit  Medication Sig Dispense Refill  . albuterol (VENTOLIN HFA) 108 (90 Base) MCG/ACT inhaler Inhale 2 puffs into the lungs every 6 (six) hours as needed for wheezing or shortness of breath.    . ALPRAZolam (XANAX) 1 MG tablet Take 1 mg by mouth 3 (three) times daily.   5  . carvedilol (COREG) 25 MG tablet Take 25 mg by mouth 2 (two) times daily.     . clopidogrel (PLAVIX) 75 MG tablet Take 1 tablet (75 mg total) by mouth daily. 30 tablet 11  . fluticasone (FLONASE) 50 MCG/ACT  nasal spray Place 1 spray into both nostrils daily as needed for allergies.     . furosemide (LASIX) 40 MG tablet Take 40 mg by mouth daily as needed (fluid retention.).    Marland Kitchen ipratropium-albuterol (DUONEB) 0.5-2.5 (3) MG/3ML SOLN Take 3 mLs by nebulization every 4 (four) hours as needed (for SOB). 360 mL 0  . meloxicam (MOBIC)  15 MG tablet Take 15 mg by mouth daily as needed for pain.     Marland Kitchen oxyCODONE (ROXICODONE) 15 MG immediate release tablet Take 15 mg by mouth 4 (four) times daily.     . rosuvastatin (CRESTOR) 40 MG tablet Take 40 mg by mouth every evening.     Marland Kitchen spironolactone (ALDACTONE) 25 MG tablet Take 25 mg by mouth daily.    Marland Kitchen losartan (COZAAR) 50 MG tablet Take 50 mg by mouth every morning.      No current facility-administered medications on file prior to visit.    There are no Patient Instructions on file for this visit. No follow-ups on file.   Kris Hartmann, NP  This note was completed with Sales executive.  Any errors are purely unintentional.

## 2019-09-12 ENCOUNTER — Encounter (INDEPENDENT_AMBULATORY_CARE_PROVIDER_SITE_OTHER): Payer: Self-pay

## 2019-09-12 ENCOUNTER — Telehealth (INDEPENDENT_AMBULATORY_CARE_PROVIDER_SITE_OTHER): Payer: Self-pay

## 2019-09-12 NOTE — Telephone Encounter (Signed)
Spoke with the patient and he is now scheduled with Dr. Delana Meyer for RLE angio on 09/18/19 with a 8:00 am arrival time to the [facility]. Patient will do covid testing on 09/14/19 between 12:30-2:30 pm at the [facility]. Pre-procedure instructions were discussed and will be mailed to the patient.

## 2019-09-14 ENCOUNTER — Other Ambulatory Visit
Admission: RE | Admit: 2019-09-14 | Discharge: 2019-09-14 | Disposition: A | Discharge status: Home / Self Care | Payer: Medicare Other | Source: Ambulatory Visit | Attending: Vascular Surgery | Admitting: Vascular Surgery

## 2019-09-14 ENCOUNTER — Other Ambulatory Visit: Payer: Medicare Other

## 2019-09-14 ENCOUNTER — Other Ambulatory Visit: Payer: Self-pay

## 2019-09-14 DIAGNOSIS — Z20822 Contact with and (suspected) exposure to covid-19: Secondary | ICD-10-CM | POA: Insufficient documentation

## 2019-09-14 DIAGNOSIS — Z01812 Encounter for preprocedural laboratory examination: Secondary | ICD-10-CM | POA: Insufficient documentation

## 2019-09-14 LAB — SARS CORONAVIRUS 2 (TAT 6-24 HRS): SARS Coronavirus 2: NEGATIVE

## 2019-09-18 ENCOUNTER — Other Ambulatory Visit (INDEPENDENT_AMBULATORY_CARE_PROVIDER_SITE_OTHER): Payer: Self-pay | Admitting: Nurse Practitioner

## 2019-09-18 ENCOUNTER — Encounter
Admission: RE | Disposition: A | Discharge status: Home / Self Care | Payer: Self-pay | Source: Home / Self Care | Attending: Vascular Surgery

## 2019-09-18 ENCOUNTER — Encounter: Payer: Self-pay | Admitting: Vascular Surgery

## 2019-09-18 ENCOUNTER — Ambulatory Visit
Admission: RE | Admit: 2019-09-18 | Discharge: 2019-09-18 | Disposition: A | Discharge status: Home / Self Care | Payer: Medicare Other | Attending: Vascular Surgery | Admitting: Vascular Surgery

## 2019-09-18 ENCOUNTER — Other Ambulatory Visit (INDEPENDENT_AMBULATORY_CARE_PROVIDER_SITE_OTHER): Payer: Self-pay

## 2019-09-18 ENCOUNTER — Other Ambulatory Visit: Payer: Self-pay

## 2019-09-18 DIAGNOSIS — Z885 Allergy status to narcotic agent status: Secondary | ICD-10-CM | POA: Insufficient documentation

## 2019-09-18 DIAGNOSIS — Z960 Presence of urogenital implants: Secondary | ICD-10-CM | POA: Diagnosis not present

## 2019-09-18 DIAGNOSIS — I70221 Atherosclerosis of native arteries of extremities with rest pain, right leg: Secondary | ICD-10-CM | POA: Insufficient documentation

## 2019-09-18 DIAGNOSIS — I509 Heart failure, unspecified: Secondary | ICD-10-CM | POA: Insufficient documentation

## 2019-09-18 DIAGNOSIS — Z7902 Long term (current) use of antithrombotics/antiplatelets: Secondary | ICD-10-CM | POA: Diagnosis not present

## 2019-09-18 DIAGNOSIS — Z87891 Personal history of nicotine dependence: Secondary | ICD-10-CM | POA: Insufficient documentation

## 2019-09-18 DIAGNOSIS — I429 Cardiomyopathy, unspecified: Secondary | ICD-10-CM | POA: Diagnosis not present

## 2019-09-18 DIAGNOSIS — I110 Hypertensive heart disease with heart failure: Secondary | ICD-10-CM | POA: Diagnosis not present

## 2019-09-18 DIAGNOSIS — Z79899 Other long term (current) drug therapy: Secondary | ICD-10-CM | POA: Insufficient documentation

## 2019-09-18 DIAGNOSIS — J42 Unspecified chronic bronchitis: Secondary | ICD-10-CM | POA: Diagnosis not present

## 2019-09-18 DIAGNOSIS — M1990 Unspecified osteoarthritis, unspecified site: Secondary | ICD-10-CM | POA: Insufficient documentation

## 2019-09-18 DIAGNOSIS — I70219 Atherosclerosis of native arteries of extremities with intermittent claudication, unspecified extremity: Secondary | ICD-10-CM

## 2019-09-18 DIAGNOSIS — E782 Mixed hyperlipidemia: Secondary | ICD-10-CM | POA: Diagnosis not present

## 2019-09-18 DIAGNOSIS — Z8619 Personal history of other infectious and parasitic diseases: Secondary | ICD-10-CM | POA: Diagnosis not present

## 2019-09-18 DIAGNOSIS — I2510 Atherosclerotic heart disease of native coronary artery without angina pectoris: Secondary | ICD-10-CM | POA: Diagnosis not present

## 2019-09-18 DIAGNOSIS — I70202 Unspecified atherosclerosis of native arteries of extremities, left leg: Secondary | ICD-10-CM

## 2019-09-18 HISTORY — PX: LOWER EXTREMITY ANGIOGRAPHY: CATH118251

## 2019-09-18 LAB — CREATININE, SERUM
Creatinine, Ser: 1.45 mg/dL — ABNORMAL HIGH (ref 0.61–1.24)
GFR calc Af Amer: 60 mL/min — ABNORMAL LOW (ref >60)
GFR calc non Af Amer: 52 mL/min — ABNORMAL LOW (ref >60)

## 2019-09-18 LAB — BUN: BUN: 36 mg/dL — ABNORMAL HIGH (ref 8–23)

## 2019-09-18 SURGERY — LOWER EXTREMITY ANGIOGRAPHY
Anesthesia: Moderate Sedation | Site: Leg Lower | Laterality: Right

## 2019-09-18 MED ORDER — METHYLPREDNISOLONE SODIUM SUCC 125 MG IJ SOLR: 125.0000 mg | Freq: Once | INTRAMUSCULAR | Status: DC | PRN

## 2019-09-18 MED ORDER — FAMOTIDINE 20 MG PO TABS: 40.0000 mg | ORAL_TABLET | Freq: Once | ORAL | Status: DC | PRN

## 2019-09-18 MED ORDER — IODIXANOL 320 MG/ML IV SOLN
INTRAVENOUS | Status: DC | PRN
  Administered 2019-09-18: 75 mL via INTRA_ARTERIAL

## 2019-09-18 MED ORDER — MIDAZOLAM HCL 2 MG/2ML IJ SOLN
INTRAMUSCULAR | Status: DC | PRN
  Administered 2019-09-18: 2 mg via INTRAVENOUS
  Administered 2019-09-18 (×2): 1 mg via INTRAVENOUS

## 2019-09-18 MED ORDER — ONDANSETRON HCL 4 MG/2ML IJ SOLN: 4.0000 mg | Freq: Four times a day (QID) | INTRAMUSCULAR | Status: DC | PRN

## 2019-09-18 MED ORDER — IPRATROPIUM-ALBUTEROL 0.5-2.5 (3) MG/3ML IN SOLN
RESPIRATORY_TRACT | Status: AC
  Filled 2019-09-18: qty 3

## 2019-09-18 MED ORDER — OXYCODONE HCL 5 MG PO TABS: 5.0000 mg | ORAL_TABLET | ORAL | Status: DC | PRN

## 2019-09-18 MED ORDER — MIDAZOLAM HCL 5 MG/5ML IJ SOLN
INTRAMUSCULAR | Status: AC
  Filled 2019-09-18: qty 5

## 2019-09-18 MED ORDER — FENTANYL CITRATE (PF) 100 MCG/2ML IJ SOLN
INTRAMUSCULAR | Status: DC | PRN
  Administered 2019-09-18 (×3): 50 ug via INTRAVENOUS

## 2019-09-18 MED ORDER — SODIUM CHLORIDE 0.9% FLUSH: 3.0000 mL | Freq: Two times a day (BID) | INTRAVENOUS | Status: DC

## 2019-09-18 MED ORDER — ACETAMINOPHEN 325 MG PO TABS: 650.0000 mg | ORAL_TABLET | ORAL | Status: DC | PRN

## 2019-09-18 MED ORDER — MIDAZOLAM HCL 2 MG/ML PO SYRP: 8.0000 mg | ORAL_SOLUTION | Freq: Once | ORAL | Status: DC | PRN

## 2019-09-18 MED ORDER — DIPHENHYDRAMINE HCL 50 MG/ML IJ SOLN: 50.0000 mg | Freq: Once | INTRAMUSCULAR | Status: DC | PRN

## 2019-09-18 MED ORDER — SODIUM CHLORIDE 0.9% FLUSH: 3.0000 mL | INTRAVENOUS | Status: DC | PRN

## 2019-09-18 MED ORDER — SODIUM CHLORIDE 0.9 % IV SOLN: INTRAVENOUS | Status: DC

## 2019-09-18 MED ORDER — HEPARIN SODIUM (PORCINE) 1000 UNIT/ML IJ SOLN
INTRAMUSCULAR | Status: AC
  Filled 2019-09-18: qty 1

## 2019-09-18 MED ORDER — HEPARIN SODIUM (PORCINE) 1000 UNIT/ML IJ SOLN
INTRAMUSCULAR | Status: DC | PRN
  Administered 2019-09-18: 4000 [IU] via INTRAVENOUS

## 2019-09-18 MED ORDER — CEFAZOLIN SODIUM-DEXTROSE 2-4 GM/100ML-% IV SOLN
INTRAVENOUS | Status: AC
  Administered 2019-09-18: 2 g via INTRAVENOUS
  Filled 2019-09-18: qty 100

## 2019-09-18 MED ORDER — FENTANYL CITRATE (PF) 100 MCG/2ML IJ SOLN: 12.5000 ug | Freq: Once | INTRAMUSCULAR | Status: DC | PRN

## 2019-09-18 MED ORDER — FENTANYL CITRATE (PF) 100 MCG/2ML IJ SOLN
INTRAMUSCULAR | Status: AC
  Filled 2019-09-18: qty 2

## 2019-09-18 MED ORDER — LABETALOL HCL 5 MG/ML IV SOLN: 10.0000 mg | INTRAVENOUS | Status: DC | PRN

## 2019-09-18 MED ORDER — CEFAZOLIN SODIUM-DEXTROSE 2-4 GM/100ML-% IV SOLN: 2.0000 g | Freq: Once | INTRAVENOUS | Status: AC

## 2019-09-18 MED ORDER — HYDRALAZINE HCL 20 MG/ML IJ SOLN: 5.0000 mg | INTRAMUSCULAR | Status: DC | PRN

## 2019-09-18 MED ORDER — IPRATROPIUM-ALBUTEROL 0.5-2.5 (3) MG/3ML IN SOLN
3.0000 mL | Freq: Once | RESPIRATORY_TRACT | Status: AC
  Administered 2019-09-18: 3 mL via RESPIRATORY_TRACT

## 2019-09-18 MED ORDER — MORPHINE SULFATE (PF) 4 MG/ML IV SOLN: 2.0000 mg | INTRAVENOUS | Status: DC | PRN

## 2019-09-18 MED ORDER — SODIUM CHLORIDE 0.9 % IV SOLN: 250.0000 mL | INTRAVENOUS | Status: DC | PRN

## 2019-09-18 SURGICAL SUPPLY — 24 items
BALLN LUTONIX 018 6X300X130 (BALLOONS) ×3
BALLN LUTONIX 018 6X40X130 (BALLOONS) ×3
BALLN LUTONIX 018 6X80X130 (BALLOONS) ×3
BALLOON LUTONIX 018 6X300X130 (BALLOONS) IMPLANT
BALLOON LUTONIX 018 6X40X130 (BALLOONS) IMPLANT
BALLOON LUTONIX 018 6X80X130 (BALLOONS) IMPLANT
CATH 4FR 100 MARINER ST (CATHETERS) IMPLANT
CATH 4FR 100CM MARINER ST (CATHETERS) ×2
CATH PIG 70CM (CATHETERS) ×2 IMPLANT
CATH ROTAREX 135 6FR (CATHETERS) ×2 IMPLANT
DEVICE PRESTO INFLATION (MISCELLANEOUS) ×2 IMPLANT
DEVICE STARCLOSE SE CLOSURE (Vascular Products) ×2 IMPLANT
GLIDEWIRE ADV .035X260CM (WIRE) ×2 IMPLANT
NDL ENTRY 21GA 7CM ECHOTIP (NEEDLE) IMPLANT
NEEDLE ENTRY 21GA 7CM ECHOTIP (NEEDLE) ×3 IMPLANT
PACK ANGIOGRAPHY (CUSTOM PROCEDURE TRAY) ×3 IMPLANT
SHEATH BRITE TIP 5FRX11 (SHEATH) ×2 IMPLANT
SHEATH FLEXOR ANSEL2 7FRX45 (SHEATH) ×2 IMPLANT
STENT LIFESTENT 5F 6X80X135 (Permanent Stent) ×2 IMPLANT
SYR MEDRAD MARK 7 150ML (SYRINGE) ×2 IMPLANT
TUBING CONTRAST HIGH PRESS 72 (TUBING) ×2 IMPLANT
WIRE G V18X300CM (WIRE) ×2 IMPLANT
WIRE J 3MM .035X145CM (WIRE) ×2 IMPLANT
WIRE MAGIC TORQUE 260C (WIRE) ×2 IMPLANT

## 2019-09-18 NOTE — Progress Notes (Signed)
Called MD re: pt. Wheezing throughout. Creatinine level pending. Orders received for duoneb treatment. Pt. States "it sounds worse than what it really is." Pt. States "I just had 2 kidney surgeries within the last few months- lithotripsy doesn't work on me."

## 2019-09-18 NOTE — H&P (Signed)
[facility] History & Physical Update  The patient was interviewed and re-examined.  The patient's previous History and Physical has been reviewed and is unchanged.  There is no change in the plan of care. We plan to proceed with the scheduled procedure.  Hortencia Pilar, MD  09/18/2019, 9:08 AM

## 2019-09-18 NOTE — Op Note (Signed)
[facility] Percutaneous Study/Intervention Procedural Note   Date of Surgery: 09/18/2019  Surgeon:  Katha Cabal, MD.  Pre-operative Diagnosis: Atherosclerotic occlusive disease bilateral lower extremities with rest pain of the right lower extremity; complication vascular device with in-stent restenosis right SFA  Post-operative diagnosis: Same  Procedure(s) Performed: 1. Introduction catheter into right lower extremity 3rd order catheter placement  2. Contrast injection right lower extremity for distal runoff   3. Percutaneous transluminal angioplasty and stent placement right superficial femoral artery and popliteal 4. Mechanical thrombectomy of the right SFA and above-knee popliteal using the [place] system.             5.  Star close closure left common femoral arteriotomy  Anesthesia: Conscious sedation was administered under my direct supervision by the interventional radiology RN. IV Versed plus fentanyl were utilized. Continuous ECG, pulse oximetry and blood pressure was monitored throughout the entire procedure.  Conscious sedation was for a total of 79 minutes.  Sheath: 7 [place] Ansell left common femoral retrograde  Contrast: 75 cc  Fluoroscopy Time: 11.3 minutes  Indications: ATWELL RUSSELLO presents with increasing rest pain of his right lower extremity.  He is status post angioplasty and stent placement of the right superficial femoral and above-knee popliteal arteries approximately 1 year ago.  Noninvasive studies and physical examination demonstrate subtotal occlusion of the SFA and popliteal at several locations.  This places him at risk for losing the stents as well as at risk for limb loss.  The risks and benefits are reviewed all questions answered patient agrees to proceed.  Procedure: KAYSEN FLORA is a 64 y.o. y.o. male who was identified and appropriate procedural time out was  performed. The patient was then placed supine on the table and prepped and draped in the usual sterile fashion.   Ultrasound was placed in the sterile sleeve and the left groin was evaluated the left common femoral artery was echolucent and pulsatile indicating patency.  Image was recorded for the permanent record and under real-time visualization a microneedle was inserted into the common femoral artery microwire followed by a micro-sheath.  A J-wire was then advanced through the micro-sheath and a  5 [place] sheath was then inserted over a J-wire. J-wire was then advanced and a 5 French pigtail catheter was positioned at the level of T12. AP projection of the aorta was then obtained. Pigtail catheter was repositioned to above the bifurcation and a LAO view of the pelvis was obtained.  Subsequently a pigtail catheter with the stiff angle Glidewire was used to cross the aortic bifurcation the catheter wire were advanced down into the right distal external iliac artery. Oblique view of the femoral bifurcation was then obtained and subsequently the wire was reintroduced and the pigtail catheter negotiated into the SFA representing third order catheter placement. Distal runoff was then performed.  Diagnostic interpretation: The abdominal aorta is opacified with a bolus injection contrast.  There are no hemodynamically significant lesions noted.  Bilateral nephrograms are seen which are normal in size.  Single renal arteries are noted bilaterally no evidence of hemodynamically significant renal artery stenosis.  The aortic bifurcation is widely patent and the right and left common and external iliac arteries are widely patent.  The internal iliac arteries are widely patent.  The right common femoral profunda femoris demonstrate diffuse disease however there are no hemodynamically significant lesions noted.  There is very slow filling of the SFA with greater than 90% stenosis at tandem  locations in the proximal  one third.  The midportion of the stented segment is widely patent.  The distal segment of the stent in the above-knee popliteal demonstrates a greater than 80% stenosis extending approximately 2 cm beyond the edge of the stent.  The mid and distal popliteal are widely patent and the trifurcation is widely patent with filling of all 3 tibials.  The anterior tibial is the dominant runoff to the foot filling the pedal arch.  4000 units of heparin was then given and allowed to circulate and a 7 [place] Ansell sheath was advanced up and over the bifurcation and positioned in the femoral artery  KMP  catheter and advantage wire were then negotiated down into the distal popliteal.  The Rota Rex wire was then introduced through the catheter.  Rota Rex device was then advanced and beginning proximally multiple passes were made.  Follow-up imaging demonstrated marked improvement in the flow pattern however a 50 to 60% residual stenosis remains proximally.  I then addressed the lesion in the distal stent in the proximal popliteal and again multiple passes were made.  Follow-up imaging demonstrated a marked improvement but a 50 to 60% residual stenosis remained.    A 6 mm x 300 mm Lutonix drug-eluting balloon was used to angioplasty the superficial femoral and popliteal arteries a second 6 mm x 40 mm Lutonix drug-eluting balloon was also used to complete the entire length extending from the distal common femoral down to the above-knee popliteal at Hunter's canal. Inflations were to 12 atmospheres for 2 minutes. Follow-up imaging demonstrated patency with less than 5% residual stenosis proximally but greater than 40% residual stenosis distally. Therefore a 6 mm x 80 mm life stent was deployed distally in the area of Hunter's canal and subsequently postdilated with a 6 mm x 80 mm Lutonix drug-eluting balloon. Distal runoff was then reassessed which demonstrates less than 10% residual stenosis in the area of Hunter's canal.   Distal runoff is then reassessed and the trifurcation is found to be widely patent and unchanged from preintervention down to the ankle.  After review of these images the sheath is pulled into the left external iliac oblique of the common femoral is obtained and a Star close device deployed. There no immediate complications.   Findings:  The abdominal aorta is opacified with a bolus injection contrast.  There are no hemodynamically significant lesions noted.  Bilateral nephrograms are seen which are normal in size.  Single renal arteries are noted bilaterally no evidence of hemodynamically significant renal artery stenosis.  The aortic bifurcation is widely patent and the right and left common and external iliac arteries are widely patent.  The internal iliac arteries are widely patent.  The right common femoral profunda femoris demonstrate diffuse disease however there are no hemodynamically significant lesions noted.  There is very slow filling of the right SFA with greater than 90% stenosis at tandem locations in the proximal one third.  The midportion of the stented segment is widely patent.  The distal segment of the stent in the above-knee popliteal demonstrates a greater than 80% stenosis extending approximately 2 cm beyond the edge of the stent.  The mid and distal popliteal are widely patent and the trifurcation is widely patent with filling of all 3 tibials.  The anterior tibial is the dominant runoff to the foot filling the pedal arch.  Following Rota Rex thrombectomy throughout the right SFA and  popliteal segment there is marked improvement in the lumen but  a 50 to 60% stenosis remains  Following angioplasty proximally in the right SFA there is less than 5% residual stenosis however distally there is 40-50% residual stenosis and therefore a life stent is deployed in the distal right SFA proximal popliteal at Hunter's canal and postdilated with a 6 mm Lutonix balloon.  Follow-up imaging  demonstrates less than 10% residual stenosis with preservation of distal runoff.     Summary: Successful recanalization right lower extremity for limb salvage    Disposition: Patient was taken to the recovery room in stable condition having tolerated the procedure well.  Shemeca Lukasik, Dolores Lory 09/18/2019,11:15 AM

## 2019-09-25 ENCOUNTER — Emergency Department
Admission: EM | Admit: 2019-09-25 | Discharge: 2019-09-25 | Disposition: A | Discharge status: Home / Self Care | Payer: Medicare Other | Attending: Emergency Medicine | Admitting: Emergency Medicine

## 2019-09-25 ENCOUNTER — Emergency Department: Payer: Medicare Other

## 2019-09-25 ENCOUNTER — Other Ambulatory Visit: Payer: Self-pay

## 2019-09-25 DIAGNOSIS — I509 Heart failure, unspecified: Secondary | ICD-10-CM | POA: Diagnosis not present

## 2019-09-25 DIAGNOSIS — J810 Acute pulmonary edema: Secondary | ICD-10-CM | POA: Insufficient documentation

## 2019-09-25 DIAGNOSIS — Z79899 Other long term (current) drug therapy: Secondary | ICD-10-CM | POA: Insufficient documentation

## 2019-09-25 DIAGNOSIS — I2510 Atherosclerotic heart disease of native coronary artery without angina pectoris: Secondary | ICD-10-CM | POA: Diagnosis not present

## 2019-09-25 DIAGNOSIS — R609 Edema, unspecified: Secondary | ICD-10-CM

## 2019-09-25 DIAGNOSIS — R2241 Localized swelling, mass and lump, right lower limb: Secondary | ICD-10-CM | POA: Diagnosis present

## 2019-09-25 DIAGNOSIS — I110 Hypertensive heart disease with heart failure: Secondary | ICD-10-CM | POA: Insufficient documentation

## 2019-09-25 DIAGNOSIS — Z87891 Personal history of nicotine dependence: Secondary | ICD-10-CM | POA: Diagnosis not present

## 2019-09-25 DIAGNOSIS — J449 Chronic obstructive pulmonary disease, unspecified: Secondary | ICD-10-CM | POA: Insufficient documentation

## 2019-09-25 LAB — BASIC METABOLIC PANEL
Anion gap: 9 (ref 5–15)
BUN: 38 mg/dL — ABNORMAL HIGH (ref 8–23)
CO2: 28 mmol/L (ref 22–32)
Calcium: 9.2 mg/dL (ref 8.9–10.3)
Chloride: 100 mmol/L (ref 98–111)
Creatinine, Ser: 1.84 mg/dL — ABNORMAL HIGH (ref 0.61–1.24)
GFR calc Af Amer: 45 mL/min — ABNORMAL LOW (ref >60)
GFR calc non Af Amer: 39 mL/min — ABNORMAL LOW (ref >60)
Glucose, Bld: 105 mg/dL — ABNORMAL HIGH (ref 70–99)
Potassium: 5.1 mmol/L (ref 3.5–5.1)
Sodium: 137 mmol/L (ref 135–145)

## 2019-09-25 LAB — CBC
HCT: 31.2 % — ABNORMAL LOW (ref 39.0–52.0)
Hemoglobin: 9.9 g/dL — ABNORMAL LOW (ref 13.0–17.0)
MCH: 26.9 pg (ref 26.0–34.0)
MCHC: 31.7 g/dL (ref 30.0–36.0)
MCV: 84.8 fL (ref 80.0–100.0)
Platelets: 256 10*3/uL (ref 150–400)
RBC: 3.68 MIL/uL — ABNORMAL LOW (ref 4.22–5.81)
RDW: 14.2 % (ref 11.5–15.5)
WBC: 8.7 10*3/uL (ref 4.0–10.5)
nRBC: 0 % (ref 0.0–0.2)

## 2019-09-25 MED ORDER — OXYCODONE HCL 5 MG PO TABS
5.0000 mg | ORAL_TABLET | ORAL | Status: AC
  Administered 2019-09-25: 5 mg via ORAL
  Filled 2019-09-25: qty 1

## 2019-09-25 NOTE — ED Notes (Signed)
Pt given phone to use.  No other needs. Updated on reason for delays

## 2019-09-25 NOTE — ED Provider Notes (Signed)
[facility] Emergency Department Provider Note  ____________________________________________  Time seen: Approximately 1:59 PM  I have reviewed the triage vital signs and the nursing notes.   HISTORY  Chief Complaint Post-op Problem    HPI [name] is a 64 y.o. male with a history of CHF diverticulitis hypertension and peripheral artery disease who comes the ED complaining of right lower leg swelling for the past 4 or 5 days.  Gradual onset, constant.  Denies pain, fever, chills, chest pain, shortness of breath.  Onset was 2 days after having revascularization procedure by vascular surgery.  Reviewed electronic medical record and op note from Dr. Delana Meyer describing stent placement and angioplasty of the right lower extremity with good results.      Past Medical History:  Diagnosis Date  . Arthritis    knees, lower back  . Bronchitis   . Cardiomyopathy ([place])   . CHF (congestive heart failure) ([place])   . Chronic back pain   . Complication of anesthesia    has trouble keeping pt asleep due to taking narcotics qid  . Coronary artery disease   . Diverticulitis   . Hepatitis C virus infection without hepatic coma    received treatment. clear now  . History of kidney stones   . Hypertension      Patient Active Problem List   Diagnosis Date Noted  . CAD (coronary artery disease) 07/05/2019  . SBO (small bowel obstruction) ([place]) 07/04/2019  . Staghorn calculus 05/28/2019  . Elevated rheumatoid factor 12/12/2018  . Prediabetes 09/20/2018  . Pain syndrome, chronic 02/24/2018  . Acute respiratory failure with hypoxia ([place]) 12/04/2017  . COPD (chronic obstructive pulmonary disease) ([place]) 12/04/2017  . Acute bronchitis 12/04/2017  . Atherosclerosis of native arteries of extremity with intermittent claudication ([place]) 11/10/2017  . Leg pain 11/10/2017  . Hyperlipidemia 11/10/2017  . DJD (degenerative joint disease) 11/10/2017  . Essential  hypertension 11/10/2017  . Lumbar spondylosis 08/17/2017  . Loss of weight   . Polyp of sigmoid colon   . Gastritis without bleeding   . Acute subendocardial infarction, subsequent episode of care ([place]) 01/05/2016  . History of urinary stone 04/18/2015  . Kidney stone 04/16/2014  . Gross hematuria 04/16/2013  . Nephrolithiasis 04/16/2013  . Cardiomyopathy ([place]) 01/23/2013  . 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 Congestive heart failure ([place]) 05/31/2011  . Current smoker 05/31/2011  . Asthma 06/24/2008     Past Surgical History:  Procedure Laterality Date  . BACK SURGERY    . CARDIAC CATHETERIZATION    . CHOLECYSTECTOMY    . COLON RESECTION  2007   due to diverticulitis  . COLONOSCOPY WITH PROPOFOL N/A 03/29/2017   Procedure: COLONOSCOPY WITH PROPOFOL;  Surgeon: Lucilla Lame, MD;  Location: [facility] ENDOSCOPY;  Service: Endoscopy;  Laterality: N/A;  . CYSTOSCOPY/URETEROSCOPY/HOLMIUM LASER/STENT PLACEMENT Left 07/16/2019   Procedure: CYSTOSCOPY/URETEROSCOPY/HOLMIUM LASER/STENT Exchange;  Surgeon: Hollice Espy, MD;  Location: [facility] ORS;  Service: Urology;  Laterality: Left;  . ESOPHAGOGASTRODUODENOSCOPY (EGD) WITH PROPOFOL N/A 03/29/2017   Procedure: ESOPHAGOGASTRODUODENOSCOPY (EGD) WITH PROPOFOL;  Surgeon: Lucilla Lame, MD;  Location: [facility] ENDOSCOPY;  Service: Endoscopy;  Laterality: N/A;  . FINGER SURGERY  2007   cut finger off with saw and was reattached  . HERNIA REPAIR    . HOLMIUM LASER APPLICATION N/A [id number]   Procedure: HOLMIUM LASER APPLICATION;  Surgeon: Hollice Espy, MD;  Location: [facility] ORS;  Service: Urology;  Laterality: N/A;  . IR NEPHROSTOMY PLACEMENT LEFT  05/28/2019  .  KIDNEY SURGERY  2021  . KNEE ARTHROSCOPY Bilateral   . LOWER EXTREMITY ANGIOGRAPHY Right 11/09/2017   Procedure: LOWER EXTREMITY ANGIOGRAPHY;  Surgeon: Katha Cabal, MD;  Location: [facility];  Service: Cardiovascular;  Laterality: Right;  . LOWER EXTREMITY ANGIOGRAPHY Right  11/21/2018   Procedure: LOWER EXTREMITY ANGIOGRAPHY;  Surgeon: Katha Cabal, MD;  Location: [facility];  Service: Cardiovascular;  Laterality: Right;  . LOWER EXTREMITY ANGIOGRAPHY Right 09/18/2019   Procedure: LOWER EXTREMITY ANGIOGRAPHY;  Surgeon: Katha Cabal, MD;  Location: [place] [facility];  Service: Cardiovascular;  Laterality: Right;  . NEPHROLITHOTOMY Left 05/28/2019   Procedure: NEPHROLITHOTOMY PERCUTANEOUS;  Surgeon: Hollice Espy, MD;  Location: [facility] ORS;  Service: Urology;  Laterality: Left;  . SPINAL FUSION       Prior to Admission medications   Medication Sig Start Date End Date Taking? Authorizing Provider  albuterol (VENTOLIN HFA) 108 (90 Base) MCG/ACT inhaler Inhale 2 puffs into the lungs every 6 (six) hours as needed for wheezing or shortness of breath.    [provider]  ALPRAZolam Duanne Moron) 1 MG tablet Take 1 mg by mouth 3 (three) times daily.  01/06/16   [provider]  carvedilol (COREG) 25 MG tablet Take 25 mg by mouth 2 (two) times daily.     [provider]  clopidogrel (PLAVIX) 75 MG tablet Take 1 tablet (75 mg total) by mouth daily. 11/09/17   Schnier, Dolores Lory, MD  fluticasone (FLONASE) 50 MCG/ACT nasal spray Place 1 spray into both nostrils daily as needed for allergies.  01/20/17   [provider]  furosemide (LASIX) 40 MG tablet Take 40 mg by mouth daily as needed (fluid retention.).    [provider]  ipratropium-albuterol (DUONEB) 0.5-2.5 (3) MG/3ML SOLN Take 3 mLs by nebulization every 4 (four) hours as needed (for SOB). Patient taking differently: Take 3 mLs by nebulization every 4 (four) hours as needed (for shortness of breath).  12/05/17   Vaughan Basta, MD  losartan (COZAAR) 50 MG tablet Take 50 mg by mouth daily.  02/15/19 09/11/20  [provider]  meloxicam (MOBIC) 15 MG tablet Take 15 mg by mouth daily as needed for pain.     [provider]  oxyCODONE  (ROXICODONE) 15 MG immediate release tablet Take 15 mg by mouth 4 (four) times daily.  05/07/19   [provider]  rosuvastatin (CRESTOR) 40 MG tablet Take 40 mg by mouth every evening.  09/20/18   [provider]  spironolactone (ALDACTONE) 25 MG tablet Take 12.5 mg by mouth daily.  05/07/19   [provider]     Allergies Codeine and Dilaudid [hydromorphone]   Family History  Problem Relation Age of Onset  . COPD Mother     Social History Social History   Tobacco Use  . Smoking status: Former Smoker    Packs/day: 0.50    Years: 25.00    Pack years: 12.50    Types: Cigarettes    Quit date: 2013    Years since quitting: 8.2  . Smokeless tobacco: Never Used  Substance Use Topics  . Alcohol use: No  . Drug use: No    Review of Systems  Constitutional:   No fever or chills.  ENT:   No sore throat. No rhinorrhea. Cardiovascular:   No chest pain or syncope. Respiratory:   No dyspnea or cough. Gastrointestinal:   Negative for abdominal pain, vomiting and diarrhea.  Musculoskeletal:   Right leg  swelling as above All other systems reviewed and are negative except as documented above in ROS and HPI.  ____________________________________________   PHYSICAL EXAM:  VITAL SIGNS: ED Triage Vitals  Enc Vitals Group     BP 09/25/19 0956 (!) 101/47     Pulse Rate 09/25/19 0956 (!) 59     Resp 09/25/19 0956 16     Temp 09/25/19 0959 98.2 F (36.8 C)     Temp Source 09/25/19 0956 Oral     SpO2 09/25/19 0956 97 %     Weight 09/25/19 0957 146 lb (66.2 kg)     Height 09/25/19 0957 5\' 7"  (1.702 m)     Head Circumference --      Peak Flow --      Pain Score 09/25/19 0956 9     Pain Loc --      Pain Edu? --      Excl. in [place]? --     Vital signs reviewed, nursing assessments reviewed.   Constitutional:   Alert and oriented. Non-toxic appearance. Eyes:   Conjunctivae are normal. EOMI. PERRL. ENT      Head:   Normocephalic and atraumatic.       Nose:   Wearing a mask.      Mouth/Throat:   Wearing a mask.      Neck:   No meningismus. Full ROM. Hematological/Lymphatic/Immunilogical:   No cervical lymphadenopathy. Cardiovascular:   RRR. Symmetric bilateral DP pulses.  No murmurs. Cap refill less than 2 seconds in the right foot. Respiratory:   Normal respiratory effort without tachypnea/retractions. Breath sounds are clear and equal bilaterally. No wheezes/rales/rhonchi. Gastrointestinal:   Soft and nontender. Non distended. There is no CVA tenderness.  No rebound, rigidity, or guarding. Musculoskeletal:   Normal range of motion in all extremities. No joint effusions.  No lower extremity tenderness.  1+ pitting edema right lower extremity below the knee Neurologic:   Normal speech and language.  Motor grossly intact. No acute focal neurologic deficits are appreciated.  Skin:    Skin is warm, dry and intact. No rash noted.  No petechiae, purpura, or bullae.  ____________________________________________    LABS (pertinent positives/negatives) (all labs ordered are listed, but only abnormal results are displayed) Labs Reviewed  BASIC METABOLIC PANEL - Abnormal; Notable for the following components:      Result Value   Glucose, Bld 105 (*)    BUN 38 (*)    Creatinine, Ser 1.84 (*)    GFR calc non Af Amer 39 (*)    GFR calc Af Amer 45 (*)    All other components within normal limits  CBC - Abnormal; Notable for the following components:   RBC 3.68 (*)    Hemoglobin 9.9 (*)    HCT 31.2 (*)    All other components within normal limits   ____________________________________________   EKG    ____________________________________________    RADIOLOGY  US Venous Img Lower Unilateral Right  Result Date: 09/25/2019 CLINICAL DATA:  Recent surgery with postoperative soft tissue swelling EXAM: RIGHT LOWER EXTREMITY VENOUS DUPLEX ULTRASOUND TECHNIQUE: Gray-scale sonography with graded compression, as well as color Doppler and  duplex ultrasound were performed to evaluate the right lower extremity deep venous system from the level of the common femoral vein and including the common femoral, femoral, profunda femoral, popliteal and calf veins including the posterior tibial, peroneal and gastrocnemius veins when visible. The superficial great saphenous vein was also interrogated. Spectral Doppler was utilized to evaluate flow at  rest and with distal augmentation maneuvers in the common femoral, femoral and popliteal veins. COMPARISON:  None. FINDINGS: Contralateral Common Femoral Vein: Respiratory phasicity is normal and symmetric with the symptomatic side. No evidence of thrombus. Normal compressibility. Common Femoral Vein: No evidence of thrombus. Normal compressibility, respiratory phasicity and response to augmentation. Saphenofemoral Junction: No evidence of thrombus. Normal compressibility and flow on color Doppler imaging. Profunda Femoral Vein: No evidence of thrombus. Normal compressibility and flow on color Doppler imaging. Femoral Vein: No evidence of thrombus. Normal compressibility, respiratory phasicity and response to augmentation. Popliteal Vein: No evidence of thrombus. Normal compressibility, respiratory phasicity and response to augmentation. Calf Veins: No evidence of thrombus. Normal compressibility and flow on color Doppler imaging. Superficial Great Saphenous Vein: No evidence of thrombus. Normal compressibility. Venous Reflux:  None. Other Findings:  None. IMPRESSION: No evidence of deep venous thrombosis in the right lower extremity. Left common femoral vein also patent. Electronically Signed   By: Lowella Grip III M.D.   On: 09/25/2019 11:06    ____________________________________________   PROCEDURES Procedures  ____________________________________________    CLINICAL IMPRESSION / ASSESSMENT AND PLAN / ED COURSE  Medications ordered in the ED: Medications - No data to display  Pertinent labs  & imaging results that were available during my care of the patient were reviewed by me and considered in my medical decision making (see chart for details).  ABDIRAHMAN ROULAND was evaluated in Emergency Department on 09/25/2019 for the symptoms described in the history of present illness. He was evaluated in the context of the global COVID-19 pandemic, which necessitated consideration that the patient might be at risk for infection with the SARS-CoV-2 virus that causes COVID-19. Institutional protocols and algorithms that pertain to the evaluation of patients at risk for COVID-19 are in a state of rapid change based on information released by regulatory bodies including the CDC and federal and state organizations. These policies and algorithms were followed during the patient's care in the ED.   Patient presents with edema of of right lower leg after recent vascular procedure.  Vital signs are normal, no inflammatory changes, no evidence of infection.  Ultrasound negative for DVT.  Discussed with vascular surgery Dr. Delana Meyer who notes this is consistent with reperfusion edema which is a benign physiologic sequela of revascularization.  Recommend leg elevation, light Ace wrap.  Stable for discharge.      ____________________________________________   FINAL CLINICAL IMPRESSION(S) / ED DIAGNOSES    Final diagnoses:  Reperfusion edema     ED Discharge Orders    None      Portions of this note were generated with dragon dictation software. Dictation errors may occur despite best attempts at proofreading.   Carrie Mew, MD 09/25/19 [phone]

## 2019-09-25 NOTE — Discharge Instructions (Addendum)
Your leg swelling is due to the adjustment to improved blood flow in your leg.  You should keep the leg elevated at all times when possible to reduce the swelling.  You can place a light ace wrap on the lower leg as well, but you should be careful not to apply tight compression to the leg.

## 2019-09-25 NOTE — ED Notes (Signed)
Pt taken to lobby phone to call ride.

## 2019-09-25 NOTE — ED Notes (Signed)
Pt currently in [place]. Will assess when arrives to room

## 2019-09-25 NOTE — ED Notes (Signed)
Dr stafford at bedside 

## 2019-09-25 NOTE — ED Triage Notes (Signed)
Pt c/o RLE swelling, pt just had RLE angiography 3/9. States it started swelling within 2 days, states he has not called to follow up with Dr. Delana Meyer about the swelling.

## 2019-10-09 ENCOUNTER — Other Ambulatory Visit (INDEPENDENT_AMBULATORY_CARE_PROVIDER_SITE_OTHER): Payer: Self-pay | Admitting: Vascular Surgery

## 2019-10-09 DIAGNOSIS — Z95820 Peripheral vascular angioplasty status with implants and grafts: Secondary | ICD-10-CM

## 2019-10-09 DIAGNOSIS — I70221 Atherosclerosis of native arteries of extremities with rest pain, right leg: Secondary | ICD-10-CM

## 2019-10-11 ENCOUNTER — Ambulatory Visit (INDEPENDENT_AMBULATORY_CARE_PROVIDER_SITE_OTHER): Payer: Medicare Other

## 2019-10-11 ENCOUNTER — Encounter (INDEPENDENT_AMBULATORY_CARE_PROVIDER_SITE_OTHER): Payer: Self-pay | Admitting: Vascular Surgery

## 2019-10-11 ENCOUNTER — Other Ambulatory Visit: Payer: Self-pay

## 2019-10-11 ENCOUNTER — Ambulatory Visit (INDEPENDENT_AMBULATORY_CARE_PROVIDER_SITE_OTHER): Payer: Medicare Other | Admitting: Vascular Surgery

## 2019-10-11 VITALS — BP 105/60 | HR 65 | Resp 14 | Ht 67.0 in | Wt 143.0 lb

## 2019-10-11 DIAGNOSIS — I10 Essential (primary) hypertension: Secondary | ICD-10-CM | POA: Diagnosis not present

## 2019-10-11 DIAGNOSIS — Z95820 Peripheral vascular angioplasty status with implants and grafts: Secondary | ICD-10-CM

## 2019-10-11 DIAGNOSIS — I70221 Atherosclerosis of native arteries of extremities with rest pain, right leg: Secondary | ICD-10-CM

## 2019-10-11 DIAGNOSIS — I25118 Atherosclerotic heart disease of native coronary artery with other forms of angina pectoris: Secondary | ICD-10-CM

## 2019-10-11 DIAGNOSIS — J42 Unspecified chronic bronchitis: Secondary | ICD-10-CM | POA: Diagnosis not present

## 2019-10-11 DIAGNOSIS — I70211 Atherosclerosis of native arteries of extremities with intermittent claudication, right leg: Secondary | ICD-10-CM | POA: Diagnosis not present

## 2019-10-11 DIAGNOSIS — E782 Mixed hyperlipidemia: Secondary | ICD-10-CM

## 2019-10-12 ENCOUNTER — Encounter (INDEPENDENT_AMBULATORY_CARE_PROVIDER_SITE_OTHER): Payer: Self-pay | Admitting: Vascular Surgery

## 2019-10-12 NOTE — Progress Notes (Signed)
MRN : [id number]  [name] is a 64 y.o. (01/12/1956) male who presents with chief complaint of  Chief Complaint  Patient presents with  . Follow-up    ultrasound follow up   .  History of Present Illness:   The patient returns to the office for followup and review status post angiogram with intervention on 09/18/2019.   Percutaneous transluminal angioplasty and stent placement right superficial femoral artery and popliteal.  Mechanical thrombectomy of the right SFA and above-knee popliteal using the [place] system.  The patient notes improvement in the lower extremity symptoms. No interval shortening of the patient's claudication distance or rest pain symptoms. Previous wounds have now healed.  No new ulcers or wounds have occurred since the last visit.  There have been no significant changes to the patient's overall health care.  The patient denies amaurosis fugax or recent TIA symptoms. There are no recent neurological changes noted. The patient denies history of DVT, PE or superficial thrombophlebitis. The patient denies recent episodes of angina or shortness of breath.   ABI's Rt=1.04 and Lt=1.05  (previous ABI's Rt=0.64 and Lt=0.65) Duplex US of the right lower extremity arterial system shows the SFA/stent is widely patent.  Current Meds  Medication Sig  . albuterol (VENTOLIN HFA) 108 (90 Base) MCG/ACT inhaler Inhale 2 puffs into the lungs every 6 (six) hours as needed for wheezing or shortness of breath.  . ALPRAZolam (XANAX) 1 MG tablet Take 1 mg by mouth 3 (three) times daily.   . carvedilol (COREG) 25 MG tablet Take 25 mg by mouth 2 (two) times daily.   . clopidogrel (PLAVIX) 75 MG tablet Take 1 tablet (75 mg total) by mouth daily.  . fluticasone (FLONASE) 50 MCG/ACT nasal spray Place 1 spray into both nostrils daily as needed for allergies.   . furosemide (LASIX) 40 MG tablet Take 40 mg by mouth daily as needed (fluid retention.).  Marland Kitchen ipratropium-albuterol (DUONEB)  0.5-2.5 (3) MG/3ML SOLN Take 3 mLs by nebulization every 4 (four) hours as needed (for SOB). (Patient taking differently: Take 3 mLs by nebulization every 4 (four) hours as needed (for shortness of breath). )  . losartan (COZAAR) 50 MG tablet Take 50 mg by mouth daily.   . meloxicam (MOBIC) 15 MG tablet Take 15 mg by mouth daily as needed for pain.   Marland Kitchen oxyCODONE (ROXICODONE) 15 MG immediate release tablet Take 15 mg by mouth 4 (four) times daily.   . rosuvastatin (CRESTOR) 40 MG tablet Take 40 mg by mouth every evening.   Marland Kitchen spironolactone (ALDACTONE) 25 MG tablet Take 12.5 mg by mouth daily.     Past Medical History:  Diagnosis Date  . Arthritis    knees, lower back  . Bronchitis   . Cardiomyopathy ([place])   . CHF (congestive heart failure) ([place])   . Chronic back pain   . Complication of anesthesia    has trouble keeping pt asleep due to taking narcotics qid  . Coronary artery disease   . Diverticulitis   . Hepatitis C virus infection without hepatic coma    received treatment. clear now  . History of kidney stones   . Hypertension     Past Surgical History:  Procedure Laterality Date  . BACK SURGERY    . CARDIAC CATHETERIZATION    . CHOLECYSTECTOMY    . COLON RESECTION  2007   due to diverticulitis  . COLONOSCOPY WITH PROPOFOL N/A 03/29/2017   Procedure: COLONOSCOPY WITH PROPOFOL;  Surgeon: Lucilla Lame, MD;  Location: [facility] ENDOSCOPY;  Service: Endoscopy;  Laterality: N/A;  . CYSTOSCOPY/URETEROSCOPY/HOLMIUM LASER/STENT PLACEMENT Left 07/16/2019   Procedure: CYSTOSCOPY/URETEROSCOPY/HOLMIUM LASER/STENT Exchange;  Surgeon: Hollice Espy, MD;  Location: [facility] ORS;  Service: Urology;  Laterality: Left;  . ESOPHAGOGASTRODUODENOSCOPY (EGD) WITH PROPOFOL N/A 03/29/2017   Procedure: ESOPHAGOGASTRODUODENOSCOPY (EGD) WITH PROPOFOL;  Surgeon: Lucilla Lame, MD;  Location: [facility] ENDOSCOPY;  Service: Endoscopy;  Laterality: N/A;  . FINGER SURGERY  2007   cut finger off with saw and was reattached   . HERNIA REPAIR    . HOLMIUM LASER APPLICATION N/A [id number]   Procedure: HOLMIUM LASER APPLICATION;  Surgeon: Hollice Espy, MD;  Location: [facility] ORS;  Service: Urology;  Laterality: N/A;  . IR NEPHROSTOMY PLACEMENT LEFT  05/28/2019  . KIDNEY SURGERY  2021  . KNEE ARTHROSCOPY Bilateral   . LOWER EXTREMITY ANGIOGRAPHY Right 11/09/2017   Procedure: LOWER EXTREMITY ANGIOGRAPHY;  Surgeon: Katha Cabal, MD;  Location: [place] CV LAB;  Service: Cardiovascular;  Laterality: Right;  . LOWER EXTREMITY ANGIOGRAPHY Right 11/21/2018   Procedure: LOWER EXTREMITY ANGIOGRAPHY;  Surgeon: Katha Cabal, MD;  Location: [place] CV LAB;  Service: Cardiovascular;  Laterality: Right;  . LOWER EXTREMITY ANGIOGRAPHY Right 09/18/2019   Procedure: LOWER EXTREMITY ANGIOGRAPHY;  Surgeon: Katha Cabal, MD;  Location: [place] [facility];  Service: Cardiovascular;  Laterality: Right;  . NEPHROLITHOTOMY Left 05/28/2019   Procedure: NEPHROLITHOTOMY PERCUTANEOUS;  Surgeon: Hollice Espy, MD;  Location: [facility] ORS;  Service: Urology;  Laterality: Left;  . SPINAL FUSION      Social History Social History   Tobacco Use  . Smoking status: Former Smoker    Packs/day: 0.50    Years: 25.00    Pack years: 12.50    Types: Cigarettes    Quit date: 2013    Years since quitting: 8.2  . Smokeless tobacco: Never Used  Substance Use Topics  . Alcohol use: No  . Drug use: No    Family History Family History  Problem Relation Age of Onset  . COPD Mother     Allergies  Allergen Reactions  . Codeine Itching  . Dilaudid [Hydromorphone] Other (See Comments)    Severe headache     REVIEW OF SYSTEMS (Negative unless checked)  Constitutional: [] Weight loss  [] Fever  [] Chills Cardiac: [] Chest pain   [] Chest pressure   [] Palpitations   [] Shortness of breath when laying flat   [] Shortness of breath with exertion. Vascular:  [x] Pain in legs with walking   [] Pain in legs at rest  [] History of DVT    [] Phlebitis   [] Swelling in legs   [] Varicose veins   [] Non-healing ulcers Pulmonary:   [] Uses home oxygen   [] Productive cough   [] Hemoptysis   [] Wheeze  [] COPD   [] Asthma Neurologic:  [] Dizziness   [] Seizures   [] History of stroke   [] History of TIA  [] Aphasia   [] Vissual changes   [] Weakness or numbness in arm   [] Weakness or numbness in leg Musculoskeletal:   [] Joint swelling   [x] Joint pain   [] Low back pain Hematologic:  [] Easy bruising  [] Easy bleeding   [] Hypercoagulable state   [] Anemic Gastrointestinal:  [] Diarrhea   [] Vomiting  [] Gastroesophageal reflux/heartburn   [] Difficulty swallowing. Genitourinary:  [] Chronic kidney disease   [] Difficult urination  [] Frequent urination   [] Blood in urine Skin:  [] Rashes   [] Ulcers  Psychological:  [] History of anxiety   []  History of major depression.  Physical Examination  Vitals:   10/11/19 1347  BP: 105/60  Pulse: 65  Resp: 14  Weight: 143 lb (64.9 kg)  Height: 5\' 7"  (1.702 m)   Body mass index is 22.4 kg/m. Gen: WD/WN, NAD Head: [place]/AT, No temporalis wasting.  Ear/Nose/Throat: Hearing grossly intact, nares w/o erythema or drainage Eyes: PER, EOMI, sclera nonicteric.  Neck: Supple, no large masses.   Pulmonary:  Good air movement, no audible wheezing bilaterally, no use of accessory muscles.  Cardiac: RRR, no JVD Vascular:  Vessel Right Left  Radial Palpable Palpable  PT Not Palpable Not Palpable  DP Trace Palpable Trace Palpable  Gastrointestinal: Non-distended. No guarding/no peritoneal signs.  Musculoskeletal: M/S 5/5 throughout.  No deformity or atrophy.  Neurologic: CN 2-12 intact. Symmetrical.  Speech is fluent. Motor exam as listed above. Psychiatric: Judgment intact, Mood & affect appropriate for pt's clinical situation. Dermatologic: No rashes or ulcers noted.  No changes consistent with cellulitis. Lymph : No lichenification or skin changes of chronic lymphedema.  CBC Lab Results  Component Value Date   WBC  8.7 09/25/2019   HGB 9.9 (L) 09/25/2019   HCT 31.2 (L) 09/25/2019   MCV 84.8 09/25/2019   PLT 256 09/25/2019    BMET    Component Value Date/Time   NA 137 09/25/2019 1003   NA 139 03/27/2014 0935   K 5.1 09/25/2019 1003   K 4.7 03/27/2014 0935   CL 100 09/25/2019 1003   CL 103 03/27/2014 0935   CO2 28 09/25/2019 1003   CO2 28 03/27/2014 0935   GLUCOSE 105 (H) 09/25/2019 1003   GLUCOSE 109 (H) 03/27/2014 0935   BUN 38 (H) 09/25/2019 1003   BUN 17 03/27/2014 0935   CREATININE 1.84 (H) 09/25/2019 1003   CREATININE 1.52 (H) 03/27/2014 0935   CALCIUM 9.2 09/25/2019 1003   CALCIUM 8.9 03/27/2014 0935   GFRNONAA 39 (L) 09/25/2019 1003   GFRNONAA 51 (L) 03/27/2014 0935   GFRAA 45 (L) 09/25/2019 1003   GFRAA 59 (L) 03/27/2014 0935   Estimated Creatinine Clearance: 38.7 mL/min (A) (by C-G formula based on SCr of 1.84 mg/dL (H)).  COAG Lab Results  Component Value Date   INR 1.0 05/28/2019   INR 1.0 05/22/2019   INR 0.9 03/01/2016    Radiology PERIPHERAL VASCULAR CATHETERIZATION  Result Date: 09/18/2019 See op note  US Venous Img Lower Unilateral Right  Result Date: 09/25/2019 CLINICAL DATA:  Recent surgery with postoperative soft tissue swelling EXAM: RIGHT LOWER EXTREMITY VENOUS DUPLEX ULTRASOUND TECHNIQUE: Gray-scale sonography with graded compression, as well as color Doppler and duplex ultrasound were performed to evaluate the right lower extremity deep venous system from the level of the common femoral vein and including the common femoral, femoral, profunda femoral, popliteal and calf veins including the posterior tibial, peroneal and gastrocnemius veins when visible. The superficial great saphenous vein was also interrogated. Spectral Doppler was utilized to evaluate flow at rest and with distal augmentation maneuvers in the common femoral, femoral and popliteal veins. COMPARISON:  None. FINDINGS: Contralateral Common Femoral Vein: Respiratory phasicity is normal and  symmetric with the symptomatic side. No evidence of thrombus. Normal compressibility. Common Femoral Vein: No evidence of thrombus. Normal compressibility, respiratory phasicity and response to augmentation. Saphenofemoral Junction: No evidence of thrombus. Normal compressibility and flow on color Doppler imaging. Profunda Femoral Vein: No evidence of thrombus. Normal compressibility and flow on color Doppler imaging. Femoral Vein: No evidence of thrombus. Normal compressibility, respiratory phasicity and response to augmentation. Popliteal Vein: No evidence of thrombus. Normal compressibility, respiratory phasicity  and response to augmentation. Calf Veins: No evidence of thrombus. Normal compressibility and flow on color Doppler imaging. Superficial Great Saphenous Vein: No evidence of thrombus. Normal compressibility. Venous Reflux:  None. Other Findings:  None. IMPRESSION: No evidence of deep venous thrombosis in the right lower extremity. Left common femoral vein also patent. Electronically Signed   By: Lowella Grip III M.D.   On: 09/25/2019 11:06   VAS [place] ABI WITH/WO TBI  Result Date: 10/11/2019 LOWER EXTREMITY DOPPLER STUDY Indications: Claudication, and peripheral artery disease.  Vascular Interventions: Rt SFA thrombectomy 09/18/2019. Comparison Study: 09/11/2019 Performing Technologist: Concha Norway RVT  Examination Guidelines: A complete evaluation includes at minimum, Doppler waveform signals and systolic blood pressure reading at the level of bilateral brachial, anterior tibial, and posterior tibial arteries, when vessel segments are accessible. Bilateral testing is considered an integral part of a complete examination. Photoelectric Plethysmograph (PPG) waveforms and toe systolic pressure readings are included as required and additional duplex testing as needed. Limited examinations for reoccurring indications may be performed as noted.  ABI Findings:  +---------+------------------+-----+----------+--------+ Right    Rt Pressure (mmHg)IndexWaveform  Comment  +---------+------------------+-----+----------+--------+ Brachial 113                                       +---------+------------------+-----+----------+--------+ ATA      118               1.04 biphasic           +---------+------------------+-----+----------+--------+ PTA      114               1.01 monophasic         +---------+------------------+-----+----------+--------+ Great Toe100               0.88 Normal             +---------+------------------+-----+----------+--------+ +---------+------------------+-----+--------+-------+ Left     Lt Pressure (mmHg)IndexWaveformComment +---------+------------------+-----+--------+-------+ Brachial 113                                    +---------+------------------+-----+--------+-------+ ATA      119               1.05 biphasic        +---------+------------------+-----+--------+-------+ PTA      118               1.04 biphasic        +---------+------------------+-----+--------+-------+ Great Toe94                0.83 Normal          +---------+------------------+-----+--------+-------+ +-------+-----------+-----------+------------+------------+ ABI/TBIToday's ABIToday's TBIPrevious ABIPrevious TBI +-------+-----------+-----------+------------+------------+ Right  1.04       .88        .64                      +-------+-----------+-----------+------------+------------+ Left   1.05       .83        .65                      +-------+-----------+-----------+------------+------------+ Right ABIs appear increased compared to prior study on 09/11/2019.  Summary: Right: Resting right ankle-brachial index is within normal range. No evidence of significant right lower extremity arterial disease. The right toe-brachial index is normal. Left: Resting left ankle-brachial  index is within normal  range. No evidence of significant left lower extremity arterial disease. The left toe-brachial index is normal.  *See table(s) above for measurements and observations.  Electronically signed by Hortencia Pilar MD on 10/11/2019 at 5:05:00 PM.    Final    VAS [place] LOWER EXTREMITY ARTERIAL DUPLEX  Result Date: 10/11/2019 LOWER EXTREMITY ARTERIAL DUPLEX STUDY Indications: Claudication, and peripheral artery disease.  Vascular Interventions: Rt SFA thrombectomy 09/18/2019. Current ABI:            rt = 1.04 ; lt = 1.05 Comparison Study: 09/11/2019 Performing Technologist: Concha Norway RVT  Examination Guidelines: A complete evaluation includes B-mode imaging, spectral Doppler, color Doppler, and power Doppler as needed of all accessible portions of each vessel. Bilateral testing is considered an integral part of a complete examination. Limited examinations for reoccurring indications may be performed as noted.  +----------+--------+-----+--------+----------+--------+ RIGHT     PSV cm/sRatioStenosisWaveform  Comments +----------+--------+-----+--------+----------+--------+ CFA Mid   127                  monophasic         +----------+--------+-----+--------+----------+--------+ DFA       107                  monophasic         +----------+--------+-----+--------+----------+--------+ SFA Prox  120                  triphasic STENT    +----------+--------+-----+--------+----------+--------+ SFA Mid   62                   triphasic STENT    +----------+--------+-----+--------+----------+--------+ SFA Distal45                   triphasic          +----------+--------+-----+--------+----------+--------+ POP Distal70                   triphasic          +----------+--------+-----+--------+----------+--------+ ATA Distal61                   triphasic          +----------+--------+-----+--------+----------+--------+ PTA Distal39                   biphasic            +----------+--------+-----+--------+----------+--------+  Summary: Right: Patent LE arterial system and SFA stent s/p thrombectomy. No evidence of stenosis.  See table(s) above for measurements and observations. Electronically signed by Hortencia Pilar MD on 10/11/2019 at 5:05:03 PM.    Final      Assessment/Plan 1. Atherosclerosis of native artery of right lower extremity with intermittent claudication (HCC) Recommend:  The patient is status post successful angiogram with intervention.  The patient reports that the claudication symptoms and leg pain is essentially gone.   The patient denies lifestyle limiting changes at this point in time.  No further invasive studies, angiography or surgery at this time The patient should continue walking and begin a more formal exercise program.  The patient should continue antiplatelet therapy and aggressive treatment of the lipid abnormalities  Smoking cessation was again discussed  The patient should continue wearing graduated compression socks 10-15 mmHg strength to control the mild edema.  Patient should undergo noninvasive studies as ordered. The patient will follow up with me after the studies.   - VAS [place] LOWER EXTREMITY ARTERIAL DUPLEX; Future - VAS [place] ABI WITH/WO  TBI; Future  2. Coronary artery disease of native artery of native heart with stable angina pectoris (HCC) Continue cardiac and antihypertensive medications as already ordered and reviewed, no changes at this time.  Continue statin as ordered and reviewed, no changes at this time  Nitrates PRN for chest pain   3. Essential hypertension Continue antihypertensive medications as already ordered, these medications have been reviewed and there are no changes at this time.   4. Chronic bronchitis, unspecified chronic bronchitis type ([place]) Continue pulmonary medications and aerosols as already ordered, these medications have been reviewed and there are no changes at this  time.    5. Mixed hyperlipidemia Continue statin as ordered and reviewed, no changes at this time     Hortencia Pilar, MD  10/12/2019 1:43 PM

## 2020-01-02 ENCOUNTER — Other Ambulatory Visit: Payer: Self-pay

## 2020-01-02 ENCOUNTER — Emergency Department
Admission: EM | Admit: 2020-01-02 | Discharge: 2020-01-02 | Discharge status: Against Medical Advice | Payer: Medicare Other | Attending: Emergency Medicine | Admitting: Emergency Medicine

## 2020-01-02 DIAGNOSIS — I110 Hypertensive heart disease with heart failure: Secondary | ICD-10-CM | POA: Diagnosis not present

## 2020-01-02 DIAGNOSIS — J449 Chronic obstructive pulmonary disease, unspecified: Secondary | ICD-10-CM | POA: Insufficient documentation

## 2020-01-02 DIAGNOSIS — J45909 Unspecified asthma, uncomplicated: Secondary | ICD-10-CM | POA: Insufficient documentation

## 2020-01-02 DIAGNOSIS — I509 Heart failure, unspecified: Secondary | ICD-10-CM | POA: Insufficient documentation

## 2020-01-02 DIAGNOSIS — M79605 Pain in left leg: Secondary | ICD-10-CM | POA: Diagnosis present

## 2020-01-02 DIAGNOSIS — I2510 Atherosclerotic heart disease of native coronary artery without angina pectoris: Secondary | ICD-10-CM | POA: Insufficient documentation

## 2020-01-02 DIAGNOSIS — Z7951 Long term (current) use of inhaled steroids: Secondary | ICD-10-CM | POA: Insufficient documentation

## 2020-01-02 DIAGNOSIS — Z79899 Other long term (current) drug therapy: Secondary | ICD-10-CM | POA: Insufficient documentation

## 2020-01-02 DIAGNOSIS — L989 Disorder of the skin and subcutaneous tissue, unspecified: Secondary | ICD-10-CM | POA: Diagnosis not present

## 2020-01-02 DIAGNOSIS — Z87891 Personal history of nicotine dependence: Secondary | ICD-10-CM | POA: Insufficient documentation

## 2020-01-02 DIAGNOSIS — L089 Local infection of the skin and subcutaneous tissue, unspecified: Secondary | ICD-10-CM | POA: Insufficient documentation

## 2020-01-02 LAB — COMPREHENSIVE METABOLIC PANEL
ALT: 9 U/L (ref 0–44)
AST: 15 U/L (ref 15–41)
Albumin: 3.8 g/dL (ref 3.5–5.0)
Alkaline Phosphatase: 73 U/L (ref 38–126)
Anion gap: 7 (ref 5–15)
BUN: 31 mg/dL — ABNORMAL HIGH (ref 8–23)
CO2: 28 mmol/L (ref 22–32)
Calcium: 8.8 mg/dL — ABNORMAL LOW (ref 8.9–10.3)
Chloride: 101 mmol/L (ref 98–111)
Creatinine, Ser: 2.01 mg/dL — ABNORMAL HIGH (ref 0.61–1.24)
GFR calc Af Amer: 40 mL/min — ABNORMAL LOW (ref >60)
GFR calc non Af Amer: 35 mL/min — ABNORMAL LOW (ref >60)
Glucose, Bld: 89 mg/dL (ref 70–99)
Potassium: 5 mmol/L (ref 3.5–5.1)
Sodium: 136 mmol/L (ref 135–145)
Total Bilirubin: 1 mg/dL (ref 0.3–1.2)
Total Protein: 6.7 g/dL (ref 6.5–8.1)

## 2020-01-02 LAB — URINALYSIS, COMPLETE (UACMP) WITH MICROSCOPIC
Bacteria, UA: NONE SEEN
Bilirubin Urine: NEGATIVE
Glucose, UA: NEGATIVE mg/dL
Ketones, ur: NEGATIVE mg/dL
Nitrite: NEGATIVE
Protein, ur: NEGATIVE mg/dL
Specific Gravity, Urine: 1.005 (ref 1.005–1.030)
pH: 6 (ref 5.0–8.0)

## 2020-01-02 LAB — CBC WITH DIFFERENTIAL/PLATELET
Abs Immature Granulocytes: 0.01 10*3/uL (ref 0.00–0.07)
Basophils Absolute: 0 10*3/uL (ref 0.0–0.1)
Basophils Relative: 1 %
Eosinophils Absolute: 0.2 10*3/uL (ref 0.0–0.5)
Eosinophils Relative: 3 %
HCT: 37.1 % — ABNORMAL LOW (ref 39.0–52.0)
Hemoglobin: 11.3 g/dL — ABNORMAL LOW (ref 13.0–17.0)
Immature Granulocytes: 0 %
Lymphocytes Relative: 15 %
Lymphs Abs: 1 10*3/uL (ref 0.7–4.0)
MCH: 25.5 pg — ABNORMAL LOW (ref 26.0–34.0)
MCHC: 30.5 g/dL (ref 30.0–36.0)
MCV: 83.7 fL (ref 80.0–100.0)
Monocytes Absolute: 0.8 10*3/uL (ref 0.1–1.0)
Monocytes Relative: 12 %
Neutro Abs: 4.4 10*3/uL (ref 1.7–7.7)
Neutrophils Relative %: 69 %
Platelets: 214 10*3/uL (ref 150–400)
RBC: 4.43 MIL/uL (ref 4.22–5.81)
RDW: 16.5 % — ABNORMAL HIGH (ref 11.5–15.5)
WBC: 6.3 10*3/uL (ref 4.0–10.5)
nRBC: 0 % (ref 0.0–0.2)

## 2020-01-02 LAB — LACTIC ACID, PLASMA: Lactic Acid, Venous: 0.6 mmol/L (ref 0.5–1.9)

## 2020-01-02 MED ORDER — SODIUM CHLORIDE 0.9% FLUSH: 3.0000 mL | Freq: Once | INTRAVENOUS | Status: DC

## 2020-01-02 MED ORDER — CEPHALEXIN 500 MG PO CAPS: 500.0000 mg | ORAL_CAPSULE | Freq: Three times a day (TID) | ORAL | 0 refills | Status: DC

## 2020-01-02 NOTE — ED Notes (Addendum)
Pt returned to desk, was in car. Did not let anyone know. Will bed when available.

## 2020-01-02 NOTE — ED Notes (Signed)
Full rainbow and 1 set of cultures sent

## 2020-01-02 NOTE — ED Provider Notes (Signed)
[facility] Emergency Department Provider Note   ____________________________________________   First MD Initiated Contact with Patient 01/02/20 1300     (approximate)  I have reviewed the triage vital signs and the nursing notes.   HISTORY  Chief Complaint Hypotension and Leg Pain    HPI [name] is a 64 y.o. male presents to the ED with complaint of right lower leg lesion.  Patient states that he was walking through the woods a couple of weeks ago and possibly hit a limb or was bitten by something.  He remembers seeing a small amount of blood on that day.  Area has been red.  Patient denies any fever, chills, nausea or vomiting.  Patient originally went to [facility] acute care and was told that he would need to be seen in the emergency department due to his low blood pressure.  Patient states that this is his normal blood pressure.  He denies any dizziness, weakness and reports other than his leg he feels fine.       Past Medical History:  Diagnosis Date  . Arthritis    knees, lower back  . Bronchitis   . Cardiomyopathy ([place])   . CHF (congestive heart failure) ([place])   . Chronic back pain   . Complication of anesthesia    has trouble keeping pt asleep due to taking narcotics qid  . Coronary artery disease   . Diverticulitis   . Hepatitis C virus infection without hepatic coma    received treatment. clear now  . History of kidney stones   . Hypertension     Patient Active Problem List   Diagnosis Date Noted  . CAD (coronary artery disease) 07/05/2019  . SBO (small bowel obstruction) ([place]) 07/04/2019  . Staghorn calculus 05/28/2019  . Elevated rheumatoid factor 12/12/2018  . Prediabetes 09/20/2018  . Pain syndrome, chronic 02/24/2018  . Acute respiratory failure with hypoxia ([place]) 12/04/2017  . COPD (chronic obstructive pulmonary disease) ([place]) 12/04/2017  . Acute bronchitis 12/04/2017  . Atherosclerosis of native arteries of  extremity with intermittent claudication ([place]) 11/10/2017  . Leg pain 11/10/2017  . Hyperlipidemia 11/10/2017  . DJD (degenerative joint disease) 11/10/2017  . Essential hypertension 11/10/2017  . Lumbar spondylosis 08/17/2017  . Loss of weight   . Polyp of sigmoid colon   . Gastritis without bleeding   . Acute subendocardial infarction, subsequent episode of care ([place]) 01/05/2016  . History of urinary stone 04/18/2015  . Kidney stone 04/16/2014  . Gross hematuria 04/16/2013  . Nephrolithiasis 04/16/2013  . Cardiomyopathy ([place]) 01/23/2013  . 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 Congestive heart failure ([place]) 05/31/2011  . Current smoker 05/31/2011  . Asthma 06/24/2008    Past Surgical History:  Procedure Laterality Date  . BACK SURGERY    . CARDIAC CATHETERIZATION    . CHOLECYSTECTOMY    . COLON RESECTION  2007   due to diverticulitis  . COLONOSCOPY WITH PROPOFOL N/A 03/29/2017   Procedure: COLONOSCOPY WITH PROPOFOL;  Surgeon: Lucilla Lame, MD;  Location: [facility] ENDOSCOPY;  Service: Endoscopy;  Laterality: N/A;  . CYSTOSCOPY/URETEROSCOPY/HOLMIUM LASER/STENT PLACEMENT Left 07/16/2019   Procedure: CYSTOSCOPY/URETEROSCOPY/HOLMIUM LASER/STENT Exchange;  Surgeon: Hollice Espy, MD;  Location: [facility] ORS;  Service: Urology;  Laterality: Left;  . ESOPHAGOGASTRODUODENOSCOPY (EGD) WITH PROPOFOL N/A 03/29/2017   Procedure: ESOPHAGOGASTRODUODENOSCOPY (EGD) WITH PROPOFOL;  Surgeon: Lucilla Lame, MD;  Location: [facility] ENDOSCOPY;  Service: Endoscopy;  Laterality: N/A;  . FINGER SURGERY  2007   cut finger  off with saw and was reattached  . HERNIA REPAIR    . HOLMIUM LASER APPLICATION N/A 54/27/0623   Procedure: HOLMIUM LASER APPLICATION;  Surgeon: Hollice Espy, MD;  Location: [facility] ORS;  Service: Urology;  Laterality: N/A;  . IR NEPHROSTOMY PLACEMENT LEFT  05/28/2019  . KIDNEY SURGERY  2021  . KNEE ARTHROSCOPY Bilateral   . LOWER EXTREMITY ANGIOGRAPHY Right 11/09/2017   Procedure: LOWER  EXTREMITY ANGIOGRAPHY;  Surgeon: Katha Cabal, MD;  Location: [facility] CV LAB;  Service: Cardiovascular;  Laterality: Right;  . LOWER EXTREMITY ANGIOGRAPHY Right 11/21/2018   Procedure: LOWER EXTREMITY ANGIOGRAPHY;  Surgeon: Katha Cabal, MD;  Location: [place] [facility] CV LAB;  Service: Cardiovascular;  Laterality: Right;  . LOWER EXTREMITY ANGIOGRAPHY Right 09/18/2019   Procedure: LOWER EXTREMITY ANGIOGRAPHY;  Surgeon: Katha Cabal, MD;  Location: [place] CV LAB;  Service: Cardiovascular;  Laterality: Right;  . NEPHROLITHOTOMY Left 05/28/2019   Procedure: NEPHROLITHOTOMY PERCUTANEOUS;  Surgeon: Hollice Espy, MD;  Location: [facility] ORS;  Service: Urology;  Laterality: Left;  . SPINAL FUSION      Prior to Admission medications   Medication Sig Start Date End Date Taking? Authorizing Provider  albuterol (VENTOLIN HFA) 108 (90 Base) MCG/ACT inhaler Inhale 2 puffs into the lungs every 6 (six) hours as needed for wheezing or shortness of breath.    [provider]  ALPRAZolam Duanne Moron) 1 MG tablet Take 1 mg by mouth 3 (three) times daily.  01/06/16   [provider]  carvedilol (COREG) 25 MG tablet Take 25 mg by mouth 2 (two) times daily.     [provider]  cephALEXin (KEFLEX) 500 MG capsule Take 1 capsule (500 mg total) by mouth 3 (three) times daily. 01/02/20   Johnn Hai, PA-C  clopidogrel (PLAVIX) 75 MG tablet Take 1 tablet (75 mg total) by mouth daily. 11/09/17   Schnier, Dolores Lory, MD  fluticasone (FLONASE) 50 MCG/ACT nasal spray Place 1 spray into both nostrils daily as needed for allergies.  01/20/17   [provider]  furosemide (LASIX) 40 MG tablet Take 40 mg by mouth daily as needed (fluid retention.).    [provider]  ipratropium-albuterol (DUONEB) 0.5-2.5 (3) MG/3ML SOLN Take 3 mLs by nebulization every 4 (four) hours as needed (for SOB). Patient taking differently: Take 3 mLs by nebulization every 4 (four) hours as  needed (for shortness of breath).  12/05/17   Vaughan Basta, MD  losartan (COZAAR) 50 MG tablet Take 50 mg by mouth daily.  02/15/19 09/11/20  [provider]  meloxicam (MOBIC) 15 MG tablet Take 15 mg by mouth daily as needed for pain.     [provider]  oxyCODONE (ROXICODONE) 15 MG immediate release tablet Take 15 mg by mouth 4 (four) times daily.  05/07/19   [provider]  rosuvastatin (CRESTOR) 40 MG tablet Take 40 mg by mouth every evening.  09/20/18   [provider]  spironolactone (ALDACTONE) 25 MG tablet Take 12.5 mg by mouth daily.  05/07/19   [provider]    Allergies Codeine and Dilaudid [hydromorphone]  Family History  Problem Relation Age of Onset  . COPD Mother     Social History Social History   Tobacco Use  . Smoking status: Former Smoker    Packs/day: 0.50    Years: 25.00    Pack years: 12.50    Types: Cigarettes    Quit date: 2013    Years since quitting: 8.4  .  Smokeless tobacco: Never Used  Vaping Use  . Vaping Use: Never used  Substance Use Topics  . Alcohol use: No  . Drug use: No    Review of Systems Constitutional: No fever/chills Eyes: No visual changes. ENT: No sore throat. Cardiovascular: Denies chest pain. Respiratory: Denies shortness of breath. Gastrointestinal: No abdominal pain.  No nausea, no vomiting.  No diarrhea.  No constipation. Genitourinary: Negative for dysuria. Musculoskeletal: Negative for back pain. Skin: Positive for lesion right leg. Neurological: Negative for headaches, focal weakness or numbness.  ____________________________________________   PHYSICAL EXAM:  VITAL SIGNS: ED Triage Vitals  Enc Vitals Group     BP 01/02/20 1145 96/72     Pulse Rate 01/02/20 1145 (!) 56     Resp 01/02/20 1145 18     Temp 01/02/20 1145 97.8 F (36.6 C)     Temp src --      SpO2 01/02/20 1145 95 %     Weight 01/02/20 1146 149 lb (67.6 kg)     Height 01/02/20 1146 5\' 7"   (1.702 m)     Head Circumference --      Peak Flow --      Pain Score 01/02/20 1145 5     Pain Loc --      Pain Edu? --      Excl. in [place]? --     Constitutional: Alert and oriented. Well appearing and in no acute distress.  Eyes: Conjunctivae are normal.  Head: Atraumatic. Nose: No congestion/rhinnorhea. Neck: No stridor.   Cardiovascular: Normal rate, regular rhythm. Grossly normal heart sounds.  Good peripheral circulation. Respiratory: Normal respiratory effort.  No retractions. Lungs CTAB. Gastrointestinal: Soft and nontender. No distention.  Musculoskeletal: Moves upper and lower extremities without difficulty.  Patient is ambulatory Neurologic:  Normal speech and language. No gross focal neurologic deficits are appreciated. No gait instability. Skin:  Skin is warm, dry.  There is a single circular area to the right lateral lower extremity that appears to have been a puncture wound.  Area is warm but without any drainage or open skin.  No edema is noted in the surrounding area. Psychiatric: Mood and affect are normal. Speech and behavior are normal.  ____________________________________________   LABS (all labs ordered are listed, but only abnormal results are displayed)  Labs Reviewed  COMPREHENSIVE METABOLIC PANEL - Abnormal; Notable for the following components:      Result Value   BUN 31 (*)    Creatinine, Ser 2.01 (*)    Calcium 8.8 (*)    GFR calc non Af Amer 35 (*)    GFR calc Af Amer 40 (*)    All other components within normal limits  CBC WITH DIFFERENTIAL/PLATELET - Abnormal; Notable for the following components:   Hemoglobin 11.3 (*)    HCT 37.1 (*)    MCH 25.5 (*)    RDW 16.5 (*)    All other components within normal limits  URINALYSIS, COMPLETE (UACMP) WITH MICROSCOPIC - Abnormal; Notable for the following components:   Color, Urine STRAW (*)    APPearance CLEAR (*)    Hgb urine dipstick SMALL (*)    Leukocytes,Ua TRACE (*)    All other components  within normal limits  LACTIC ACID, PLASMA    PROCEDURES  Procedure(s) performed (including Critical Care):  Procedures   ____________________________________________   INITIAL IMPRESSION / ASSESSMENT AND PLAN / ED COURSE  As part of my medical decision making, I reviewed the following data within the electronic  MEDICAL RECORD NUMBER Notes from prior ED visits and [place] Controlled Substance Database  64 year old male presents to the ED with complaint of lesion to his right leg that possibly happened 2 weeks ago when he was walking through the woods.  He states the area continues to be somewhat red but he denies any fever or chills.  He has continued to ambulate without any assistance.  He went to the walk-in clinic today who was concerned about his blood pressure being low at 96/72.  Patient states that his blood pressure tends to run low.  Multiple charts were reviewed has had low blood pressure such as 102/64, 105/60 and 95/59.  He also has a history of elevated BUN and creatinines and he is being followed.  ____________________________________________   FINAL CLINICAL IMPRESSION(S) / ED DIAGNOSES  Final diagnoses:  Skin lesion of right leg  Infection, skin     ED Discharge Orders         Ordered    cephALEXin (KEFLEX) 500 MG capsule  3 times daily     Discontinue  Reprint     01/02/20 1558           Note:  This document was prepared using Dragon voice recognition software and may include unintentional dictation errors.    Johnn Hai, PA-C 01/02/20 1614    Lilia Pro., MD 01/04/20 2008

## 2020-01-02 NOTE — Discharge Instructions (Signed)
Follow-up with your primary care provider if any continued problems or concerns.  Begin taking the antibiotic as directed until you have finished.  Also clean the area on your right leg with mild soap and water.  If any worsening of this area you will need to return to the emergency department.  Also make an appointment with your primary care provider for your blood pressure to be rechecked.

## 2020-01-02 NOTE — ED Notes (Signed)
No answer for room 

## 2020-01-02 NOTE — ED Triage Notes (Signed)
Pt comes via POV from [facility] with c/o hypotension and right leg pain. Pt states he hit his leg couple weeks ago on something while in wood.  Pt has band-aid covering small area noted to right lower leg. No drainage noted.. Slight redness and swelling.  Current BP-96/72

## 2020-01-02 NOTE — ED Notes (Addendum)
See triage note  Having pain to right lower leg  States he hit it on something couple of weeks ago  Also has been tired lately  No fever

## 2020-01-02 NOTE — ED Notes (Signed)
Called no answer for room. Outside and bathroom checked

## 2020-01-02 NOTE — ED Notes (Signed)
Called no answer

## 2020-01-02 NOTE — ED Triage Notes (Signed)
First nurse note- here for low bp from [facility].  At check in bp 105/64. NAD.  Was there for cellulitis

## 2020-01-20 NOTE — Progress Notes (Deleted)
MRN : [id number]  [name] is a 64 y.o. (10-Nov-1955) male who presents with chief complaint of No chief complaint on file. Marland Kitchen  History of Present Illness:   The patient returns to the office for followup and review status post angiogram with intervention on 09/18/2019.   Percutaneous transluminal angioplasty and stent placementrightsuperficial femoral artery and popliteal.  Mechanical thrombectomy of the right SFA and above-knee popliteal using the [place] system.  The patient notes improvement in the lower extremity symptoms. No interval shortening of the patient's claudication distance or rest pain symptoms. Previous wounds have now healed.  No new ulcers or wounds have occurred since the last visit.  There have been no significant changes to the patient's overall health care.  The patient denies amaurosis fugax or recent TIA symptoms. There are no recent neurological changes noted. The patient denies history of DVT, PE or superficial thrombophlebitis. The patient denies recent episodes of angina or shortness of breath.   ABI's Rt=1.04 and Lt=1.05  (previous ABI's Rt=0.64 and Lt=0.65) Duplex US of the right lower extremity arterial system shows the SFA/stent is widely patent.   No outpatient medications have been marked as taking for the 01/21/20 encounter (Appointment) with Delana Meyer, Dolores Lory, MD.    Past Medical History:  Diagnosis Date  . Arthritis    knees, lower back  . Bronchitis   . Cardiomyopathy ([place])   . CHF (congestive heart failure) ([place])   . Chronic back pain   . Complication of anesthesia    has trouble keeping pt asleep due to taking narcotics qid  . Coronary artery disease   . Diverticulitis   . Hepatitis C virus infection without hepatic coma    received treatment. clear now  . History of kidney stones   . Hypertension     Past Surgical History:  Procedure Laterality Date  . BACK SURGERY    . CARDIAC CATHETERIZATION    .  CHOLECYSTECTOMY    . COLON RESECTION  2007   due to diverticulitis  . COLONOSCOPY WITH PROPOFOL N/A 03/29/2017   Procedure: COLONOSCOPY WITH PROPOFOL;  Surgeon: Lucilla Lame, MD;  Location: [facility] ENDOSCOPY;  Service: Endoscopy;  Laterality: N/A;  . CYSTOSCOPY/URETEROSCOPY/HOLMIUM LASER/STENT PLACEMENT Left 07/16/2019   Procedure: CYSTOSCOPY/URETEROSCOPY/HOLMIUM LASER/STENT Exchange;  Surgeon: Hollice Espy, MD;  Location: [facility] ORS;  Service: Urology;  Laterality: Left;  . ESOPHAGOGASTRODUODENOSCOPY (EGD) WITH PROPOFOL N/A 03/29/2017   Procedure: ESOPHAGOGASTRODUODENOSCOPY (EGD) WITH PROPOFOL;  Surgeon: Lucilla Lame, MD;  Location: [facility] ENDOSCOPY;  Service: Endoscopy;  Laterality: N/A;  . FINGER SURGERY  2007   cut finger off with saw and was reattached  . HERNIA REPAIR    . HOLMIUM LASER APPLICATION N/A 79/89/2119   Procedure: HOLMIUM LASER APPLICATION;  Surgeon: Hollice Espy, MD;  Location: [facility] ORS;  Service: Urology;  Laterality: N/A;  . IR NEPHROSTOMY PLACEMENT LEFT  05/28/2019  . KIDNEY SURGERY  2021  . KNEE ARTHROSCOPY Bilateral   . LOWER EXTREMITY ANGIOGRAPHY Right 11/09/2017   Procedure: LOWER EXTREMITY ANGIOGRAPHY;  Surgeon: Katha Cabal, MD;  Location: [place] CV LAB;  Service: Cardiovascular;  Laterality: Right;  . LOWER EXTREMITY ANGIOGRAPHY Right 11/21/2018   Procedure: LOWER EXTREMITY ANGIOGRAPHY;  Surgeon: Katha Cabal, MD;  Location: [place] CV LAB;  Service: Cardiovascular;  Laterality: Right;  . LOWER EXTREMITY ANGIOGRAPHY Right 09/18/2019   Procedure: LOWER EXTREMITY ANGIOGRAPHY;  Surgeon: Katha Cabal, MD;  Location: [place] CV LAB;  Service: Cardiovascular;  Laterality: Right;  .  NEPHROLITHOTOMY Left 05/28/2019   Procedure: NEPHROLITHOTOMY PERCUTANEOUS;  Surgeon: Hollice Espy, MD;  Location: [facility] ORS;  Service: Urology;  Laterality: Left;  . SPINAL FUSION      Social History Social History   Tobacco Use  . Smoking status: Former  Smoker    Packs/day: 0.50    Years: 25.00    Pack years: 12.50    Types: Cigarettes    Quit date: 2013    Years since quitting: 8.5  . Smokeless tobacco: Never Used  Vaping Use  . Vaping Use: Never used  Substance Use Topics  . Alcohol use: No  . Drug use: No    Family History Family History  Problem Relation Age of Onset  . COPD Mother     Allergies  Allergen Reactions  . Codeine Itching  . Dilaudid [Hydromorphone] Other (See Comments)    Severe headache     REVIEW OF SYSTEMS (Negative unless checked)  Constitutional: [] Weight loss  [] Fever  [] Chills Cardiac: [] Chest pain   [] Chest pressure   [] Palpitations   [] Shortness of breath when laying flat   [] Shortness of breath with exertion. Vascular:  [] Pain in legs with walking   [] Pain in legs at rest  [] History of DVT   [] Phlebitis   [] Swelling in legs   [] Varicose veins   [] Non-healing ulcers Pulmonary:   [] Uses home oxygen   [] Productive cough   [] Hemoptysis   [] Wheeze  [] COPD   [] Asthma Neurologic:  [] Dizziness   [] Seizures   [] History of stroke   [] History of TIA  [] Aphasia   [] Vissual changes   [] Weakness or numbness in arm   [] Weakness or numbness in leg Musculoskeletal:   [] Joint swelling   [] Joint pain   [] Low back pain Hematologic:  [] Easy bruising  [] Easy bleeding   [] Hypercoagulable state   [] Anemic Gastrointestinal:  [] Diarrhea   [] Vomiting  [] Gastroesophageal reflux/heartburn   [] Difficulty swallowing. Genitourinary:  [] Chronic kidney disease   [] Difficult urination  [] Frequent urination   [] Blood in urine Skin:  [] Rashes   [] Ulcers  Psychological:  [] History of anxiety   []  History of major depression.  Physical Examination  There were no vitals filed for this visit. There is no height or weight on file to calculate BMI. Gen: WD/WN, NAD Head: [place]/AT, No temporalis wasting.  Ear/Nose/Throat: Hearing grossly intact, nares w/o erythema or drainage Eyes: PER, EOMI, sclera nonicteric.  Neck: Supple, no large  masses.   Pulmonary:  Good air movement, no audible wheezing bilaterally, no use of accessory muscles.  Cardiac: RRR, no JVD Vascular: *** Vessel Right Left  Radial Palpable Palpable  Ulnar Palpable Palpable  Brachial Palpable Palpable  Carotid Palpable Palpable  Femoral Palpable Palpable  Popliteal Palpable Palpable  PT Palpable Palpable  DP Palpable Palpable  Gastrointestinal: Non-distended. No guarding/no peritoneal signs.  Musculoskeletal: M/S 5/5 throughout.  No deformity or atrophy.  Neurologic: CN 2-12 intact. Symmetrical.  Speech is fluent. Motor exam as listed above. Psychiatric: Judgment intact, Mood & affect appropriate for pt's clinical situation. Dermatologic: No rashes or ulcers noted.  No changes consistent with cellulitis. Lymph : No lichenification or skin changes of chronic lymphedema.  CBC Lab Results  Component Value Date   WBC 6.3 01/02/2020   HGB 11.3 (L) 01/02/2020   HCT 37.1 (L) 01/02/2020   MCV 83.7 01/02/2020   PLT 214 01/02/2020    BMET    Component Value Date/Time   NA 136 01/02/2020 1147   NA 139 03/27/2014 0935   K 5.0 01/02/2020  1147   K 4.7 03/27/2014 0935   CL 101 01/02/2020 1147   CL 103 03/27/2014 0935   CO2 28 01/02/2020 1147   CO2 28 03/27/2014 0935   GLUCOSE 89 01/02/2020 1147   GLUCOSE 109 (H) 03/27/2014 0935   BUN 31 (H) 01/02/2020 1147   BUN 17 03/27/2014 0935   CREATININE 2.01 (H) 01/02/2020 1147   CREATININE 1.52 (H) 03/27/2014 0935   CALCIUM 8.8 (L) 01/02/2020 1147   CALCIUM 8.9 03/27/2014 0935   GFRNONAA 35 (L) 01/02/2020 1147   GFRNONAA 51 (L) 03/27/2014 0935   GFRAA 40 (L) 01/02/2020 1147   GFRAA 59 (L) 03/27/2014 0935   CrCl cannot be calculated (Unknown ideal weight.).  COAG Lab Results  Component Value Date   INR 1.0 05/28/2019   INR 1.0 05/22/2019   INR 0.9 03/01/2016    Radiology No results found.  Outside Studies/Documentation *** pages of outside documents were reviewed.  They showed  ***.  Assessment/Plan There are no diagnoses linked to this encounter.   Hortencia Pilar, MD  01/20/2020 4:44 PM

## 2020-01-21 ENCOUNTER — Encounter (INDEPENDENT_AMBULATORY_CARE_PROVIDER_SITE_OTHER): Payer: Medicare Other

## 2020-01-21 ENCOUNTER — Ambulatory Visit (INDEPENDENT_AMBULATORY_CARE_PROVIDER_SITE_OTHER): Payer: Medicare Other | Admitting: Vascular Surgery

## 2020-01-29 ENCOUNTER — Other Ambulatory Visit: Payer: Self-pay

## 2020-01-29 ENCOUNTER — Encounter: Payer: Self-pay | Admitting: Emergency Medicine

## 2020-01-29 ENCOUNTER — Emergency Department
Admission: EM | Admit: 2020-01-29 | Discharge: 2020-01-29 | Disposition: A | Discharge status: Home / Self Care | Payer: Medicare Other | Attending: Emergency Medicine | Admitting: Emergency Medicine

## 2020-01-29 DIAGNOSIS — F17210 Nicotine dependence, cigarettes, uncomplicated: Secondary | ICD-10-CM | POA: Insufficient documentation

## 2020-01-29 DIAGNOSIS — I2510 Atherosclerotic heart disease of native coronary artery without angina pectoris: Secondary | ICD-10-CM | POA: Insufficient documentation

## 2020-01-29 DIAGNOSIS — I110 Hypertensive heart disease with heart failure: Secondary | ICD-10-CM | POA: Insufficient documentation

## 2020-01-29 DIAGNOSIS — Z7951 Long term (current) use of inhaled steroids: Secondary | ICD-10-CM | POA: Diagnosis not present

## 2020-01-29 DIAGNOSIS — J45909 Unspecified asthma, uncomplicated: Secondary | ICD-10-CM | POA: Diagnosis not present

## 2020-01-29 DIAGNOSIS — R52 Pain, unspecified: Secondary | ICD-10-CM

## 2020-01-29 DIAGNOSIS — M791 Myalgia, unspecified site: Secondary | ICD-10-CM | POA: Insufficient documentation

## 2020-01-29 DIAGNOSIS — J449 Chronic obstructive pulmonary disease, unspecified: Secondary | ICD-10-CM | POA: Insufficient documentation

## 2020-01-29 DIAGNOSIS — I509 Heart failure, unspecified: Secondary | ICD-10-CM | POA: Insufficient documentation

## 2020-01-29 LAB — BASIC METABOLIC PANEL
Anion gap: 9 (ref 5–15)
BUN: 54 mg/dL — ABNORMAL HIGH (ref 8–23)
CO2: 27 mmol/L (ref 22–32)
Calcium: 9.3 mg/dL (ref 8.9–10.3)
Chloride: 103 mmol/L (ref 98–111)
Creatinine, Ser: 2.09 mg/dL — ABNORMAL HIGH (ref 0.61–1.24)
GFR calc Af Amer: 38 mL/min — ABNORMAL LOW (ref >60)
GFR calc non Af Amer: 33 mL/min — ABNORMAL LOW (ref >60)
Glucose, Bld: 116 mg/dL — ABNORMAL HIGH (ref 70–99)
Potassium: 4.5 mmol/L (ref 3.5–5.1)
Sodium: 139 mmol/L (ref 135–145)

## 2020-01-29 LAB — HEPATIC FUNCTION PANEL
ALT: 10 U/L (ref 0–44)
AST: 19 U/L (ref 15–41)
Albumin: 4 g/dL (ref 3.5–5.0)
Alkaline Phosphatase: 81 U/L (ref 38–126)
Bilirubin, Direct: 0.2 mg/dL (ref 0.0–0.2)
Indirect Bilirubin: 0.9 mg/dL (ref 0.3–0.9)
Total Bilirubin: 1.1 mg/dL (ref 0.3–1.2)
Total Protein: 7.5 g/dL (ref 6.5–8.1)

## 2020-01-29 LAB — URINALYSIS, COMPLETE (UACMP) WITH MICROSCOPIC
Bacteria, UA: NONE SEEN
Bilirubin Urine: NEGATIVE
Glucose, UA: NEGATIVE mg/dL
Ketones, ur: NEGATIVE mg/dL
Leukocytes,Ua: NEGATIVE
Nitrite: NEGATIVE
Protein, ur: 30 mg/dL — AB
Specific Gravity, Urine: 1.014 (ref 1.005–1.030)
pH: 7 (ref 5.0–8.0)

## 2020-01-29 LAB — CBC
HCT: 40.5 % (ref 39.0–52.0)
Hemoglobin: 13.1 g/dL (ref 13.0–17.0)
MCH: 25.9 pg — ABNORMAL LOW (ref 26.0–34.0)
MCHC: 32.3 g/dL (ref 30.0–36.0)
MCV: 80.2 fL (ref 80.0–100.0)
Platelets: 234 10*3/uL (ref 150–400)
RBC: 5.05 MIL/uL (ref 4.22–5.81)
RDW: 17.8 % — ABNORMAL HIGH (ref 11.5–15.5)
WBC: 10.9 10*3/uL — ABNORMAL HIGH (ref 4.0–10.5)
nRBC: 0 % (ref 0.0–0.2)

## 2020-01-29 MED ORDER — DICLOFENAC SODIUM 1 % EX GEL: 4.0000 g | Freq: Four times a day (QID) | CUTANEOUS | 0 refills | Status: DC

## 2020-01-29 NOTE — ED Triage Notes (Addendum)
Pt in via POV, brought over from [facility] Walk In, reports needing to be seen for generalized weakness w/ difficulty walking x a few days w/ bilateral tremors.  Per [place], sent over due to HR in the 30's.  HR of 76 noted upon arrival via EKG.   Pt A/Ox4, vitals WDL, NAD noted at this time.

## 2020-01-29 NOTE — ED Provider Notes (Signed)
[facility] Emergency Department Provider Note   ____________________________________________   I have reviewed the triage vital signs and the nursing notes.   HISTORY  Chief Complaint Muscle aches  History limited by: Not Limited   HPI [name] is a 64 y.o. male who presents to the emergency department today with primary concern for muscle aches.  Patient states that he woke up a couple of days ago with pain from his neck down.  He states the pain was so severe because it hard for him to move and get out of bed.  He says it similar episodes of happened in the past although he has been seen for it without any clear explanation. He describes it as his arthritis. The patient initially tried to go to urgent care however they sent him to the emergency department because of concerns for bradycardia.  Patient himself does not think that what is going on is related to his heart. He says he did try taking some of the pain medications he has at home for his back without any significant relief.    Records reviewed. Per medical record review patient has a history of arthritis.  Past Medical History:  Diagnosis Date  . Arthritis    knees, lower back  . Bronchitis   . Cardiomyopathy ([place])   . CHF (congestive heart failure) ([place])   . Chronic back pain   . Complication of anesthesia    has trouble keeping pt asleep due to taking narcotics qid  . Coronary artery disease   . Diverticulitis   . Hepatitis C virus infection without hepatic coma    received treatment. clear now  . History of kidney stones   . Hypertension     Patient Active Problem List   Diagnosis Date Noted  . CAD (coronary artery disease) 07/05/2019  . SBO (small bowel obstruction) ([place]) 07/04/2019  . Staghorn calculus 05/28/2019  . Elevated rheumatoid factor 12/12/2018  . Prediabetes 09/20/2018  . Pain syndrome, chronic 02/24/2018  . Acute respiratory failure with hypoxia ([place]) 12/04/2017   . COPD (chronic obstructive pulmonary disease) ([place]) 12/04/2017  . Acute bronchitis 12/04/2017  . Atherosclerosis of native arteries of extremity with intermittent claudication ([place]) 11/10/2017  . Leg pain 11/10/2017  . Hyperlipidemia 11/10/2017  . DJD (degenerative joint disease) 11/10/2017  . Essential hypertension 11/10/2017  . Lumbar spondylosis 08/17/2017  . Loss of weight   . Polyp of sigmoid colon   . Gastritis without bleeding   . 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 History of urinary stone 04/18/2015  . Kidney stone 04/16/2014  . Gross hematuria 04/16/2013  . Nephrolithiasis 04/16/2013  . Cardiomyopathy ([place]) 01/23/2013  . 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 Congestive heart failure ([place]) 05/31/2011  . Current smoker 05/31/2011  . Asthma 06/24/2008    Past Surgical History:  Procedure Laterality Date  . BACK SURGERY    . CARDIAC CATHETERIZATION    . CHOLECYSTECTOMY    . COLON RESECTION  2007   due to diverticulitis  . COLONOSCOPY WITH PROPOFOL N/A 03/29/2017   Procedure: COLONOSCOPY WITH PROPOFOL;  Surgeon: Lucilla Lame, MD;  Location: [facility] ENDOSCOPY;  Service: Endoscopy;  Laterality: N/A;  . CYSTOSCOPY/URETEROSCOPY/HOLMIUM LASER/STENT PLACEMENT Left 07/16/2019   Procedure: CYSTOSCOPY/URETEROSCOPY/HOLMIUM LASER/STENT Exchange;  Surgeon: Hollice Espy, MD;  Location: [facility] ORS;  Service: Urology;  Laterality: Left;  . ESOPHAGOGASTRODUODENOSCOPY (EGD) WITH PROPOFOL N/A 03/29/2017   Procedure: ESOPHAGOGASTRODUODENOSCOPY (EGD) WITH PROPOFOL;  Surgeon: Lucilla Lame,  MD;  Location: [facility] ENDOSCOPY;  Service: Endoscopy;  Laterality: N/A;  . FINGER SURGERY  2007   cut finger off with saw and was reattached  . HERNIA REPAIR    . HOLMIUM LASER APPLICATION N/A 09/73/5329   Procedure: HOLMIUM LASER APPLICATION;  Surgeon: Hollice Espy, MD;  Location: [facility] ORS;  Service: Urology;  Laterality: N/A;  . IR NEPHROSTOMY PLACEMENT LEFT   05/28/2019  . KIDNEY SURGERY  2021  . KNEE ARTHROSCOPY Bilateral   . LOWER EXTREMITY ANGIOGRAPHY Right 11/09/2017   Procedure: LOWER EXTREMITY ANGIOGRAPHY;  Surgeon: Katha Cabal, MD;  Location: [place] [facility];  Service: Cardiovascular;  Laterality: Right;  . LOWER EXTREMITY ANGIOGRAPHY Right 11/21/2018   Procedure: LOWER EXTREMITY ANGIOGRAPHY;  Surgeon: Katha Cabal, MD;  Location: [place] [facility];  Service: Cardiovascular;  Laterality: Right;  . LOWER EXTREMITY ANGIOGRAPHY Right 09/18/2019   Procedure: LOWER EXTREMITY ANGIOGRAPHY;  Surgeon: Katha Cabal, MD;  Location: [place] [facility];  Service: Cardiovascular;  Laterality: Right;  . NEPHROLITHOTOMY Left 05/28/2019   Procedure: NEPHROLITHOTOMY PERCUTANEOUS;  Surgeon: Hollice Espy, MD;  Location: [facility] ORS;  Service: Urology;  Laterality: Left;  . SPINAL FUSION      Prior to Admission medications   Medication Sig Start Date End Date Taking? Authorizing Provider  albuterol (VENTOLIN HFA) 108 (90 Base) MCG/ACT inhaler Inhale 2 puffs into the lungs every 6 (six) hours as needed for wheezing or shortness of breath.    [provider]  ALPRAZolam Duanne Moron) 1 MG tablet Take 1 mg by mouth 3 (three) times daily.  01/06/16   [provider]  carvedilol (COREG) 25 MG tablet Take 25 mg by mouth 2 (two) times daily.     [provider]  cephALEXin (KEFLEX) 500 MG capsule Take 1 capsule (500 mg total) by mouth 3 (three) times daily. 01/02/20   Johnn Hai, PA-C  clopidogrel (PLAVIX) 75 MG tablet Take 1 tablet (75 mg total) by mouth daily. 11/09/17   Schnier, Dolores Lory, MD  fluticasone (FLONASE) 50 MCG/ACT nasal spray Place 1 spray into both nostrils daily as needed for allergies.  01/20/17   [provider]  furosemide (LASIX) 40 MG tablet Take 40 mg by mouth daily as needed (fluid retention.).    [provider]  ipratropium-albuterol (DUONEB) 0.5-2.5 (3) MG/3ML SOLN Take 3 mLs  by nebulization every 4 (four) hours as needed (for SOB). Patient taking differently: Take 3 mLs by nebulization every 4 (four) hours as needed (for shortness of breath).  12/05/17   Vaughan Basta, MD  losartan (COZAAR) 50 MG tablet Take 50 mg by mouth daily.  02/15/19 09/11/20  [provider]  meloxicam (MOBIC) 15 MG tablet Take 15 mg by mouth daily as needed for pain.     [provider]  oxyCODONE (ROXICODONE) 15 MG immediate release tablet Take 15 mg by mouth 4 (four) times daily.  05/07/19   [provider]  rosuvastatin (CRESTOR) 40 MG tablet Take 40 mg by mouth every evening.  09/20/18   [provider]  spironolactone (ALDACTONE) 25 MG tablet Take 12.5 mg by mouth daily.  05/07/19   [provider]    Allergies Codeine and Dilaudid [hydromorphone]  Family History  Problem Relation Age of Onset  . COPD Mother     Social History Social History   Tobacco Use  . Smoking status: Current Every Day Smoker    Packs/day: 0.50    Years: 25.00  Pack years: 12.50    Types: Cigarettes  . Smokeless tobacco: Never Used  Vaping Use  . Vaping Use: Never used  Substance Use Topics  . Alcohol use: No  . Drug use: No    Review of Systems Constitutional: No fever/chills Eyes: No visual changes. ENT: No sore throat. Cardiovascular: Denies chest pain. Respiratory: Denies shortness of breath. Gastrointestinal: No abdominal pain.  No nausea, no vomiting.  No diarrhea.   Genitourinary: Negative for dysuria. Musculoskeletal: Positive for body aches.  Skin: Negative for rash. Neurological: Negative for headaches, focal weakness or numbness.  ____________________________________________   PHYSICAL EXAM:  VITAL SIGNS: ED Triage Vitals  Enc Vitals Group     BP 01/29/20 1728 125/90     Pulse Rate 01/29/20 1728 62     Resp 01/29/20 1728 16     Temp 01/29/20 1728 99.5 F (37.5 C)     Temp Source 01/29/20 1728 Oral     SpO2 01/29/20  1728 97 %     Weight 01/29/20 1730 145 lb (65.8 kg)     Height 01/29/20 1730 5\' 7"  (1.702 m)     Head Circumference --      Peak Flow --      Pain Score 01/29/20 1729 9   Constitutional: Alert and oriented.  Eyes: Conjunctivae are normal.  ENT      Head: Normocephalic and atraumatic.      Nose: No congestion/rhinnorhea.      Mouth/Throat: Mucous membranes are moist.      Neck: No stridor. Hematological/Lymphatic/Immunilogical: No cervical lymphadenopathy. Cardiovascular: Normal rate, regular rhythm.  No murmurs, rubs, or gallops. Respiratory: Normal respiratory effort without tachypnea nor retractions. Breath sounds are clear and equal bilaterally. No wheezes/rales/rhonchi. Gastrointestinal: Soft and non tender. No rebound. No guarding.  Genitourinary: Deferred Musculoskeletal: Normal range of motion in all extremities. No lower extremity edema. Neurologic:  Normal speech and language. No gross focal neurologic deficits are appreciated.  Skin:  Skin is warm, dry and intact. No rash noted. Psychiatric: Mood and affect are normal. Speech and behavior are normal. Patient exhibits appropriate insight and judgment.  ____________________________________________    LABS (pertinent positives/negatives)  BMP na 139, k 4.5, glu 116, cr 2.09 CBC wbc 10.9, hgb 13.1, plt 234 UA clear, moderate hgb dipstick, rbc 21-50  ____________________________________________   EKG  I, Nance Pear, attending physician, personally viewed and interpreted this EKG  EKG Time: 1721 Rate: 76 Rhythm: sinus rhythm with PVC Axis: right axis deviation Intervals: qtc 517 QRS: Intraventricular conduction block ST changes: no st elevation Impression: abnormal ekg  ____________________________________________    RADIOLOGY  None ____________________________________________   PROCEDURES  Procedures  ____________________________________________   INITIAL IMPRESSION / ASSESSMENT AND PLAN / ED  COURSE  Pertinent labs & imaging results that were available during my care of the patient were reviewed by me and considered in my medical decision making (see chart for details).   Patient presents to the emergency department today because of concerns for muscle aches.  States they have been going on for a couple days although he has had similar episodes previously.  He describes it as being arthritis like.  Was sent to the emergency department today from [facility] clinic because of concerns for bradycardia.  Patient however not significantly bradycardic here in the emergency department it does not seem to differ from other vital sign readings he has had in the past.  Patient is already on narcotic pain medications for his back.  Will give patient  prescription for pain cream to help with his muscle aches.  Discussed importance of follow-up.  ____________________________________________   FINAL CLINICAL IMPRESSION(S) / ED DIAGNOSES  Final diagnoses:  Body aches     Note: This dictation was prepared with Dragon dictation. Any transcriptional errors that result from this process are unintentional     Nance Pear, MD 01/29/20 2303

## 2020-01-29 NOTE — Discharge Instructions (Addendum)
Please seek medical attention for any high fevers, chest pain, shortness of breath, change in behavior, persistent vomiting, bloody stool or any other new or concerning symptoms.  

## 2020-02-05 ENCOUNTER — Other Ambulatory Visit: Payer: Self-pay | Admitting: Family

## 2020-02-05 DIAGNOSIS — N63 Unspecified lump in unspecified breast: Secondary | ICD-10-CM

## 2020-02-14 ENCOUNTER — Ambulatory Visit
Admission: RE | Admit: 2020-02-14 | Discharge: 2020-02-14 | Disposition: A | Discharge status: Home / Self Care | Payer: Medicare Other | Source: Ambulatory Visit | Attending: Family | Admitting: Family

## 2020-02-14 ENCOUNTER — Other Ambulatory Visit: Payer: Self-pay

## 2020-02-14 DIAGNOSIS — N6342 Unspecified lump in left breast, subareolar: Secondary | ICD-10-CM | POA: Insufficient documentation

## 2020-02-14 DIAGNOSIS — N63 Unspecified lump in unspecified breast: Secondary | ICD-10-CM | POA: Diagnosis present

## 2020-05-10 IMAGING — DX DG ABD PORTABLE 1V
1 series · 1 of 1 positions shown · non-contrast
Comparison: CT Abdomen and Pelvis 5693 hours today.

CLINICAL DATA: 63-year-old male NG tube placement.

EXAM:
PORTABLE ABDOMEN - 1 VIEW

[abdomen supine]
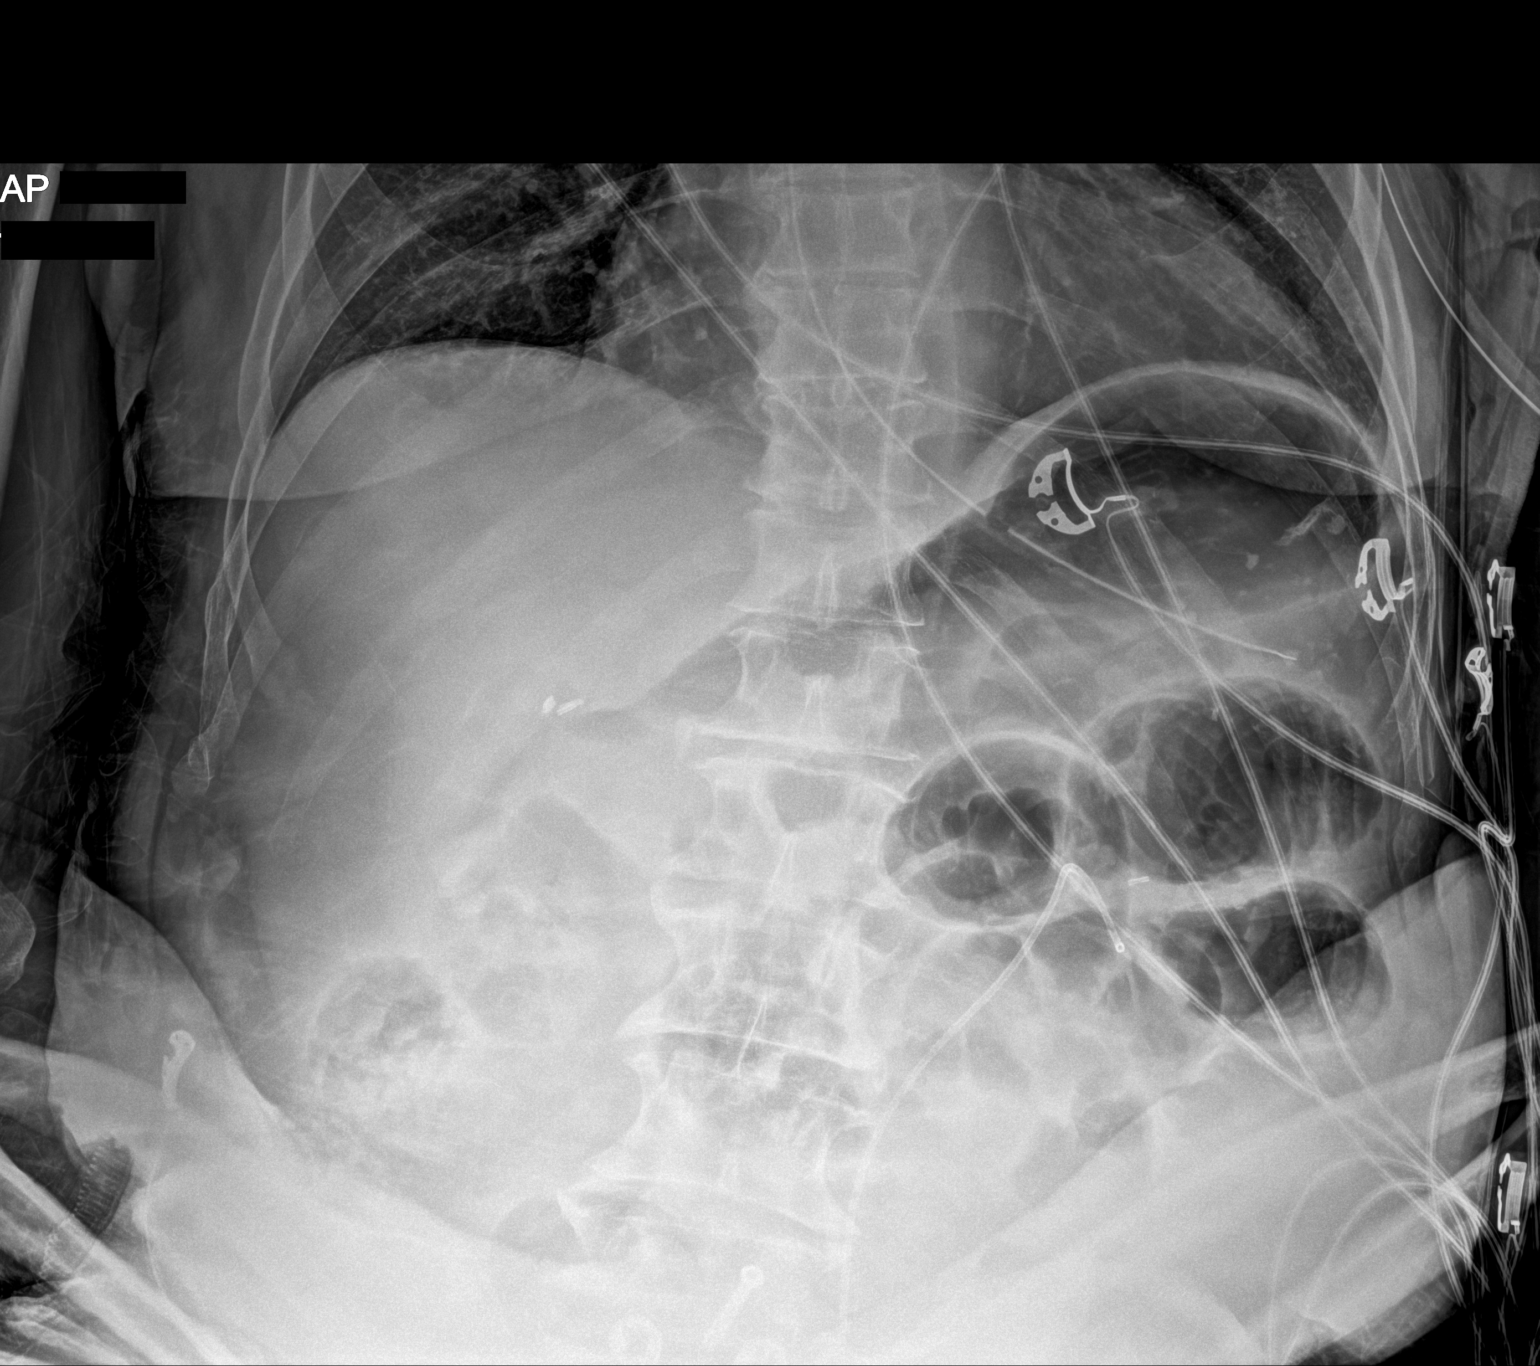

[1 of 1 positions shown; findings below may reference images not displayed]

FINDINGS: Portable AP upright view at 9958 hours. Enteric tube placed into the
stomach. Tip and side hole at the level of the gastric fundus.
Gastric distension may be slightly decreased from the CT earlier
today. Dilated small bowel loops persist. No free air identified.
Stable lung bases. Left ureteral stent partially visible.
IMPRESSION: 1. Enteric tube placed into the stomach, tip and side hole at the
level of the gastric fundus.
2. Gastric distension may be slightly decreased since the CT earlier
today.

## 2020-05-10 IMAGING — CT CT ABD-PELV W/ CM
2 of 5 series · 12 of 46 positions shown, 14 images · IV contrast (APPLIED)
Comparison: March 22, 2014
COMPARISON: March 22, 2014

Addendum:
CLINICAL DATA: Diffuse abdominal pain with nausea and vomiting

EXAM:
CT ABDOMEN AND PELVIS WITH CONTRAST
TECHNIQUE: Multidetector CT imaging of the abdomen and pelvis was performed
using the standard protocol following bolus administration of
intravenous contrast.
CONTRAST:  75mL OMNIPAQUE IOHEXOL 300 MG/ML  SOLN

[Series 2: axial st · axial · 0.78mm/px · z∈[-630,-260]mm · 9 of 86 slices shown, 11 images]
[im 6/86  soft-tissue]
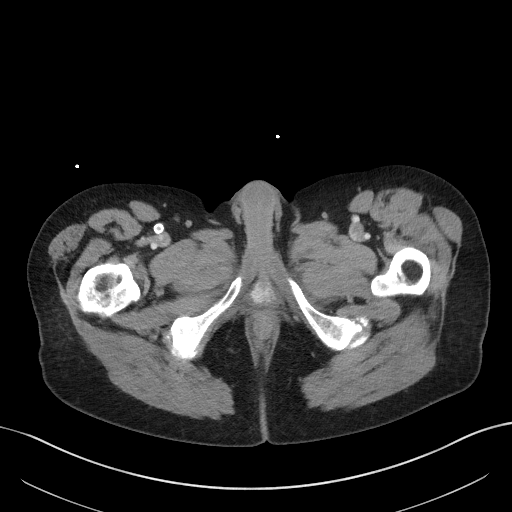
[im 6/86  bone]
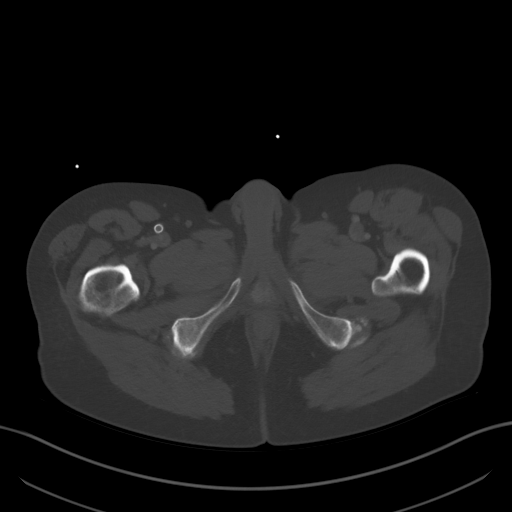
[im 18/86  soft-tissue]
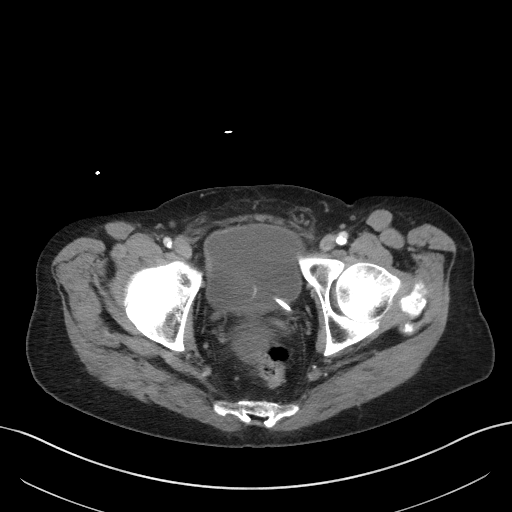
[im 23/86  soft-tissue]
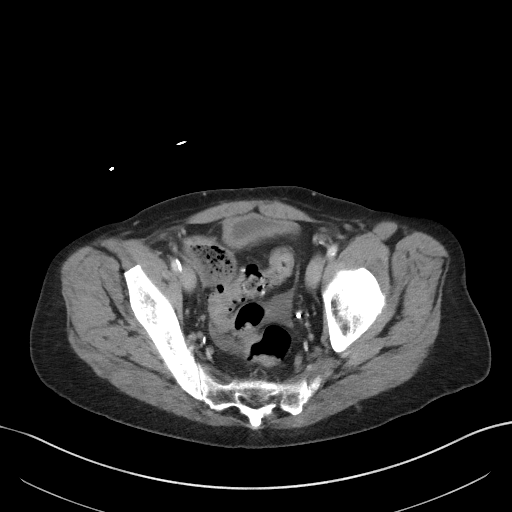
[im 35/86  soft-tissue]
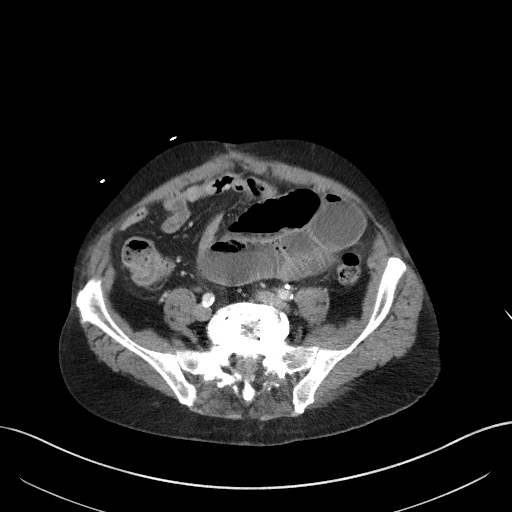
[im 46/86  soft-tissue]
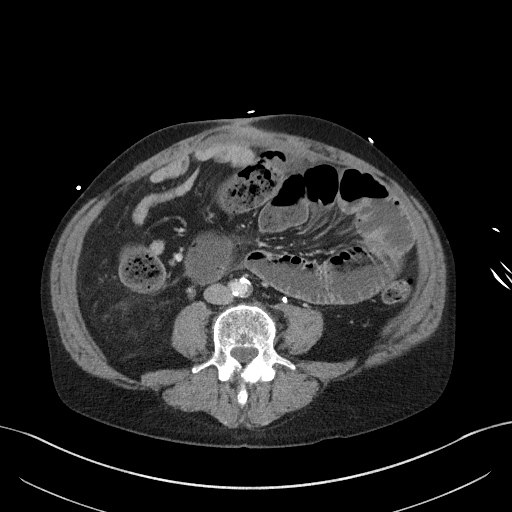
[im 52/86  soft-tissue]
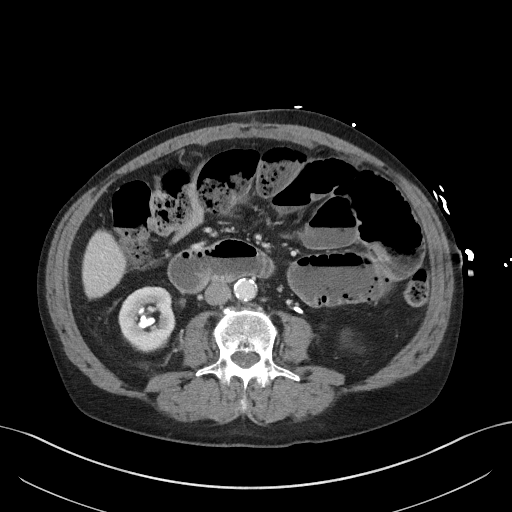
[im 63/86  soft-tissue]
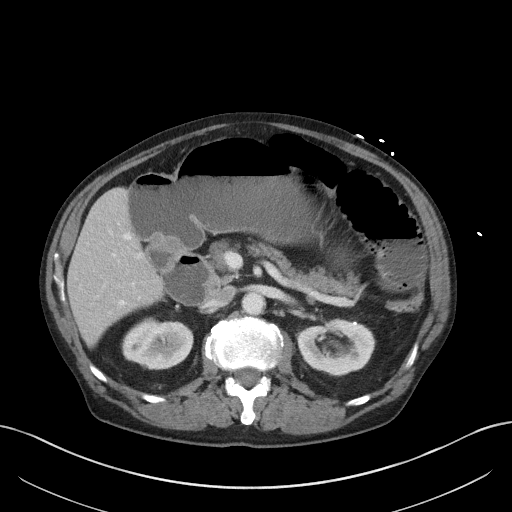
[im 69/86  soft-tissue]
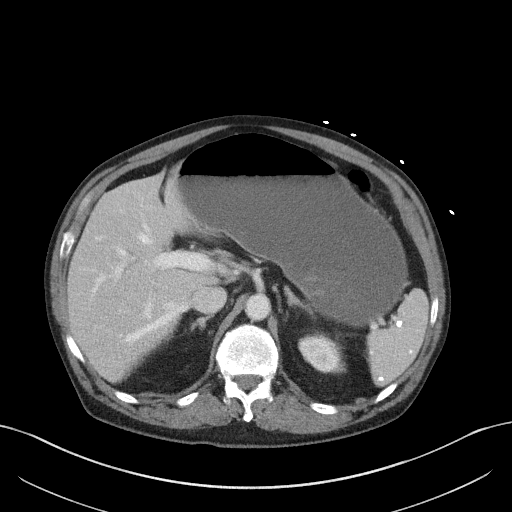
[im 80/86  soft-tissue]
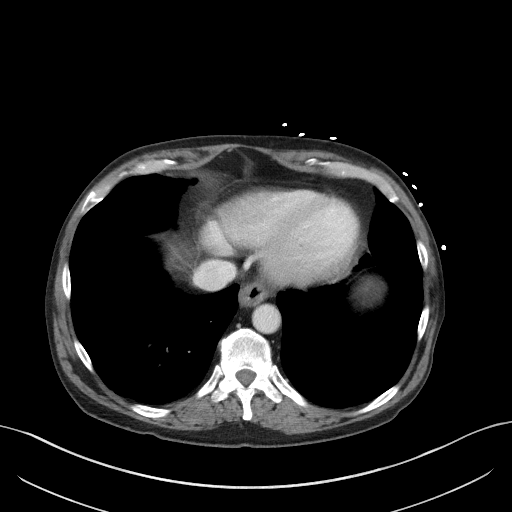
[im 80/86  bone]
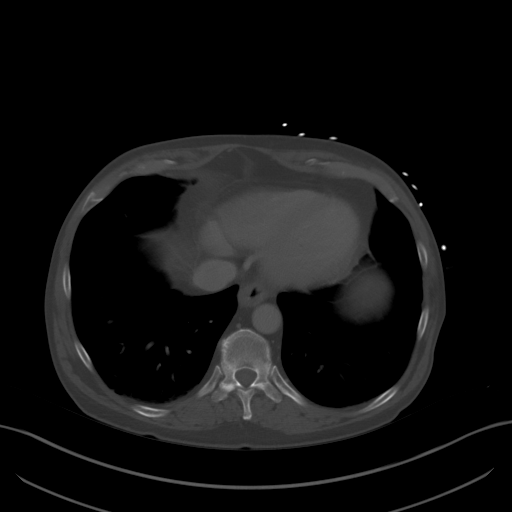

[Series 5: coronal st · coronal · 0.75mm/px · 3 of 90 slices shown]
[im 30/90  soft-tissue]
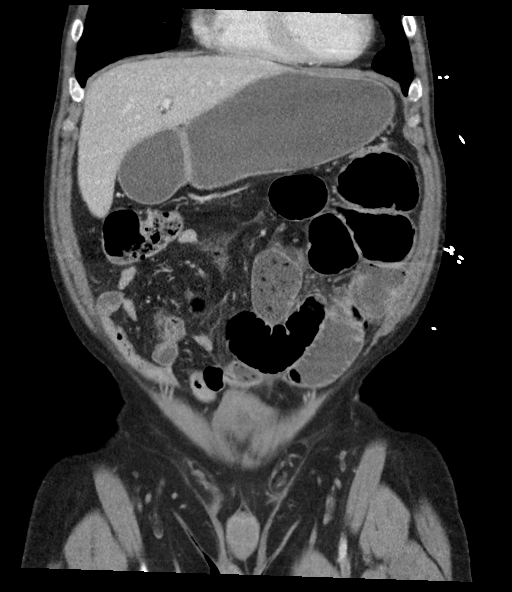
[im 40/90  soft-tissue]
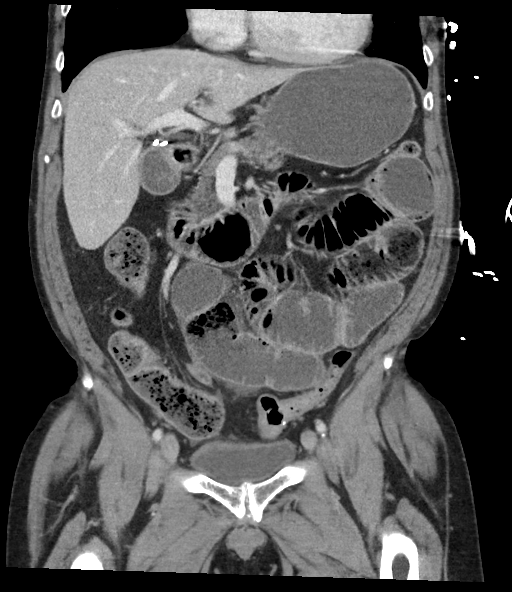
[im 50/90  soft-tissue]
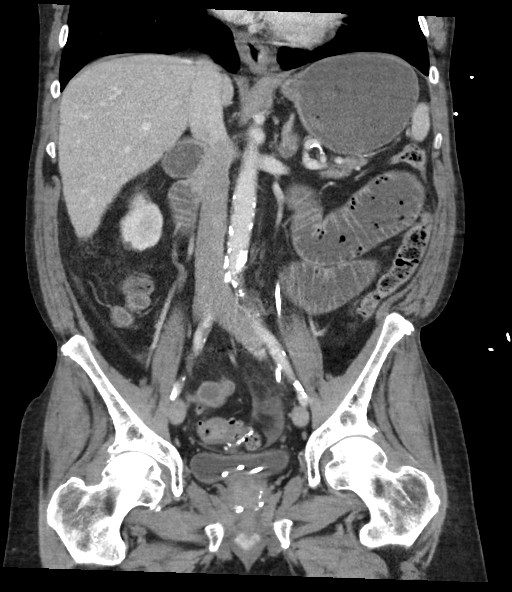

[12 of 46 positions shown; findings below may reference images not displayed]

FINDINGS: Lower chest: There is slight atelectasis in the lung bases. Lung
bases otherwise are clear. There is thickening of the distal
esophageal wall.

Hepatobiliary: No focal liver lesions are appreciable. Gallbladder
is absent. There is no biliary duct dilatation.

Pancreas: There is no pancreatic mass or inflammatory focus.

Spleen: There are calcified granulomas throughout the spleen. Spleen
otherwise appears unremarkable.

Adrenals/Urinary Tract: Adrenals appear normal bilaterally. A
double-J stent is seen on the left extending from the renal pelvis
to the urinary bladder on the left. There is minimal fullness of the
left renal collecting system. There is no hydronephrosis on the
left. On the right, there is a cyst arising from the posterior upper
to midportion of the kidney measuring 3.0 x 2.9 cm there are
subcentimeter cysts in the mid to lower poles of the left kidney,
largest measuring just under 1 cm. There is a staghorn calculus in
the lower pole of the right kidney measuring 2.7 x 1.7 cm. There is
a calculus in the mid left kidney measuring 0.8 x 0.6 cm. There is
no evident ureteral calculus on either side. Urinary bladder is
midline with wall thickness within normal limits.

Stomach/Bowel: Stomach is distended with fluid and air. The stomach
wall is not thickened. There are multiple loops of dilated small
bowel with a transition zone in the mid ileal region consistent with
small bowel obstruction. There is no evident free air or portal
venous air. A portion of colon has been removed with apparent patent
anastomosis. There is moderate stool in colon currently. The
terminal ileal region appears unremarkable.

Vascular/Lymphatic: There is no abdominal aortic aneurysm. There is
aortic and iliac artery atherosclerotic calcification. No adenopathy
is appreciable in the abdomen or pelvis.

Reproductive: Prostate contains multiple calculi. Prostate and
seminal vesicles are normal in size and contour. There is no evident
pelvic mass.

Other: Appendix appears normal. There is mild partially loculated
ascites in the pelvis. No abscess is evident in the abdomen or
pelvis. There is scarring in the region of the umbilicus.

Musculoskeletal: Postoperative changes noted at L5-S1. There is
arthropathy in the lumbar spine. No blastic or lytic bone lesions
are evident. No intramuscular lesions are appreciable.
IMPRESSION: 1. Small bowel obstruction with transition zone at the mid ileal
level. No free air or portal venous air. No demonstrable abscess. No
periappendiceal region inflammation.

2. Double-J stent on the left extending from the left renal pelvis
to the bladder. Slight hydronephrosis present on the left.

3. Staghorn calculus lower pole right kidney measuring 2.7 x 1.7 cm.
Calculus in mid left kidney measuring 0.8 x 0.6 cm. Multiple
prostatic calculi noted.

4. Loculated ascites in the pelvis. This ascites may be secondary to
the bowel obstruction.

5. Thickening of the wall of the distal esophagus is likely
indicative of distal esophagitis.

6.  Aortic Atherosclerosis (PAPPI-KUC.C).

7.  Absent gallbladder.

8.  Calcified splenic granulomas.

ADDENDUM:
Note that there is asymmetric anterior urinary bladder wall
thickening. Suspect inflammation in this area, potentially at least
in part secondary to the presence of the double-J stent on the left.

*** End of Addendum ***
FINDINGS: Lower chest: There is slight atelectasis in the lung bases. Lung
bases otherwise are clear. There is thickening of the distal
esophageal wall.

Hepatobiliary: No focal liver lesions are appreciable. Gallbladder
is absent. There is no biliary duct dilatation.

Pancreas: There is no pancreatic mass or inflammatory focus.

Spleen: There are calcified granulomas throughout the spleen. Spleen
otherwise appears unremarkable.

Adrenals/Urinary Tract: Adrenals appear normal bilaterally. A
double-J stent is seen on the left extending from the renal pelvis
to the urinary bladder on the left. There is minimal fullness of the
left renal collecting system. There is no hydronephrosis on the
left. On the right, there is a cyst arising from the posterior upper
to midportion of the kidney measuring 3.0 x 2.9 cm there are
subcentimeter cysts in the mid to lower poles of the left kidney,
largest measuring just under 1 cm. There is a staghorn calculus in
the lower pole of the right kidney measuring 2.7 x 1.7 cm. There is
a calculus in the mid left kidney measuring 0.8 x 0.6 cm. There is
no evident ureteral calculus on either side. Urinary bladder is
midline with wall thickness within normal limits.

Stomach/Bowel: Stomach is distended with fluid and air. The stomach
wall is not thickened. There are multiple loops of dilated small
bowel with a transition zone in the mid ileal region consistent with
small bowel obstruction. There is no evident free air or portal
venous air. A portion of colon has been removed with apparent patent
anastomosis. There is moderate stool in colon currently. The
terminal ileal region appears unremarkable.

Vascular/Lymphatic: There is no abdominal aortic aneurysm. There is
aortic and iliac artery atherosclerotic calcification. No adenopathy
is appreciable in the abdomen or pelvis.

Reproductive: Prostate contains multiple calculi. Prostate and
seminal vesicles are normal in size and contour. There is no evident
pelvic mass.

Other: Appendix appears normal. There is mild partially loculated
ascites in the pelvis. No abscess is evident in the abdomen or
pelvis. There is scarring in the region of the umbilicus.

Musculoskeletal: Postoperative changes noted at L5-S1. There is
arthropathy in the lumbar spine. No blastic or lytic bone lesions
are evident. No intramuscular lesions are appreciable.
IMPRESSION: 1. Small bowel obstruction with transition zone at the mid ileal
level. No free air or portal venous air. No demonstrable abscess. No
periappendiceal region inflammation.

2. Double-J stent on the left extending from the left renal pelvis
to the bladder. Slight hydronephrosis present on the left.

3. Staghorn calculus lower pole right kidney measuring 2.7 x 1.7 cm.
Calculus in mid left kidney measuring 0.8 x 0.6 cm. Multiple
prostatic calculi noted.

4. Loculated ascites in the pelvis. This ascites may be secondary to
the bowel obstruction.

5. Thickening of the wall of the distal esophagus is likely
indicative of distal esophagitis.

6.  Aortic Atherosclerosis (PAPPI-KUC.C).

7.  Absent gallbladder.

8.  Calcified splenic granulomas.

## 2020-05-11 IMAGING — DX DG ABDOMEN 1V
1 series · 1 of 1 positions shown · non-contrast
Comparison: And 07/04/2019

CLINICAL DATA: Abdominal distention

EXAM:
ABDOMEN - 1 VIEW

[abdomen supine]
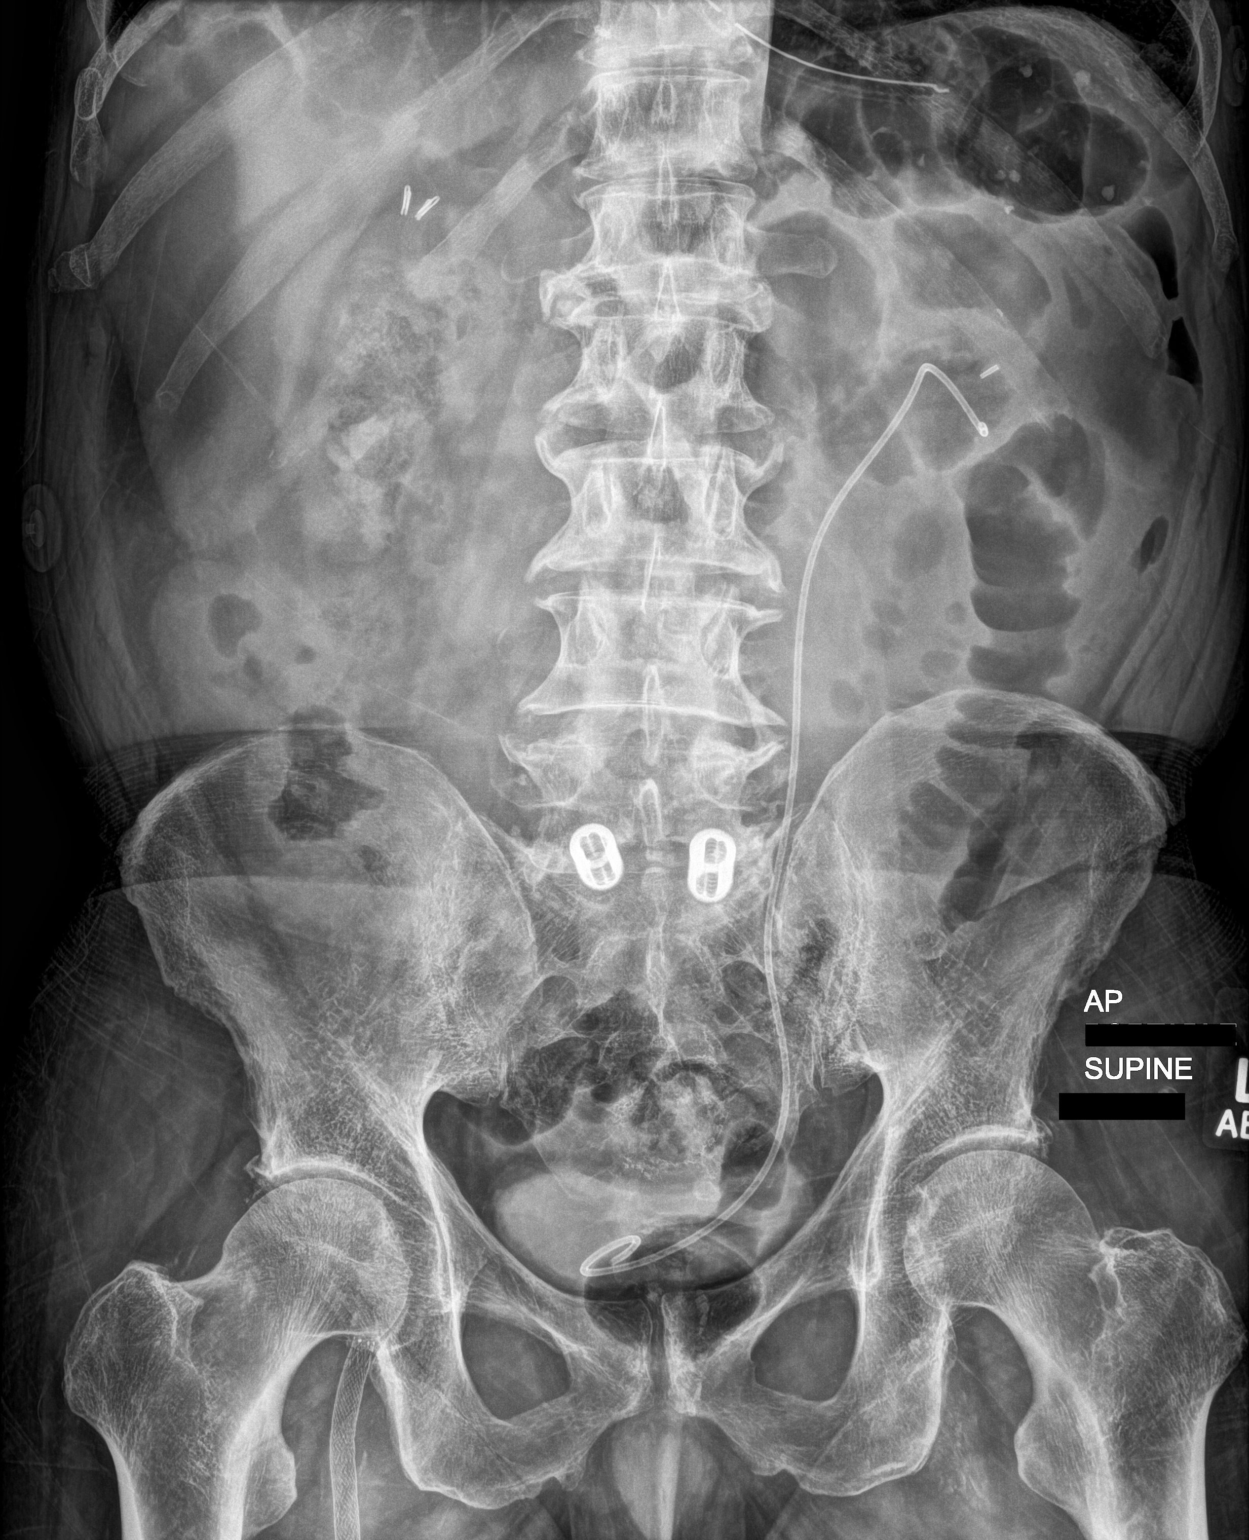

[1 of 1 positions shown; findings below may reference images not displayed]

FINDINGS: NG tube tip is in the proximal stomach with the side port in the
distal esophagus. Left ureteral stent remains in place, unchanged.
There are mildly prominent left abdominal small bowel loops,
decreasing since prior study. No free air organomegaly. Prior
cholecystectomy.
IMPRESSION: NG tube tip is in the proximal stomach with the side port in the
distal esophagus. This has retracted slightly since prior study.

Mildly prominent left abdominal small bowel loops, decreased in
distention since prior study compatible with improving small bowel
obstruction.

## 2020-05-11 IMAGING — DX DG ABD PORTABLE 1V
1 series · 1 of 1 positions shown · non-contrast
Comparison: Radiograph 07/05/2019

CLINICAL DATA: Small-bowel obstruction, 8 hour delay

EXAM:
PORTABLE ABDOMEN - 1 VIEW

[abdomen supine]
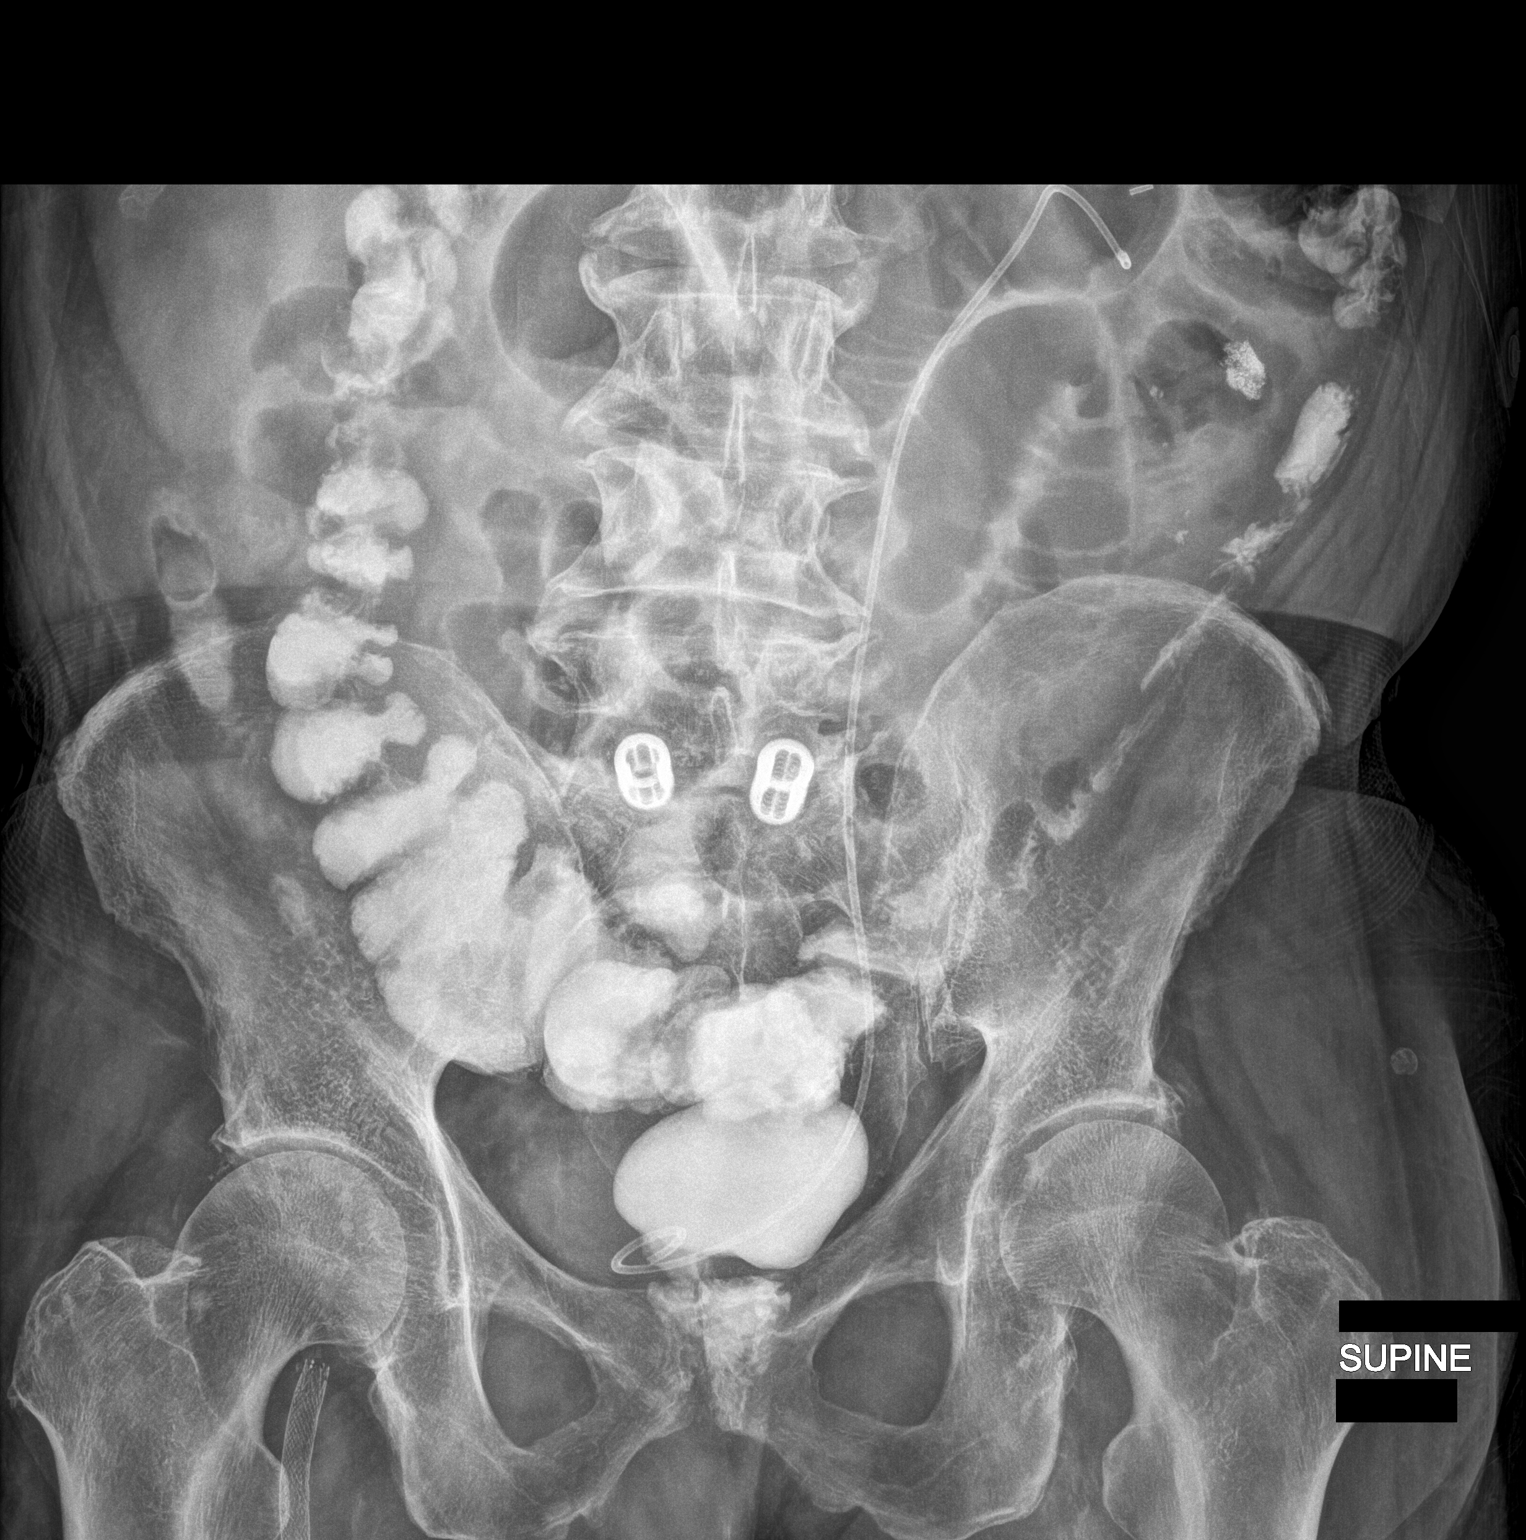

[1 of 1 positions shown; findings below may reference images not displayed]

FINDINGS: There has been passage of enteric contrast media to the level of the
rectum. Few air distended loops of small bowel remain in the mid
abdomen which are nonspecific. Several surgical clips noted in the
right and left upper quadrant. A left nephroureteral stent is in
similar position to prior. Vascular stent noted in the right groin.
Lumbosacral hardware is noted and unchanged from prior. Degenerative
changes in the spine and pelvis.
IMPRESSION: 1. Interval passage of enteric contrast media to the level of the
rectum.
2. Few air distended loops of small bowel which remain in the mid
abdomen are therefore nonspecific.

## 2020-10-13 DIAGNOSIS — I739 Peripheral vascular disease, unspecified: Secondary | ICD-10-CM | POA: Diagnosis not present

## 2020-10-16 DIAGNOSIS — I739 Peripheral vascular disease, unspecified: Secondary | ICD-10-CM | POA: Diagnosis not present

## 2020-10-21 DIAGNOSIS — I2510 Atherosclerotic heart disease of native coronary artery without angina pectoris: Secondary | ICD-10-CM | POA: Diagnosis not present

## 2020-10-21 DIAGNOSIS — M79661 Pain in right lower leg: Secondary | ICD-10-CM | POA: Diagnosis not present

## 2020-10-21 DIAGNOSIS — E785 Hyperlipidemia, unspecified: Secondary | ICD-10-CM | POA: Diagnosis not present

## 2020-10-21 DIAGNOSIS — I5022 Chronic systolic (congestive) heart failure: Secondary | ICD-10-CM | POA: Diagnosis not present

## 2020-10-21 DIAGNOSIS — Z95820 Peripheral vascular angioplasty status with implants and grafts: Secondary | ICD-10-CM | POA: Diagnosis not present

## 2020-10-21 DIAGNOSIS — F17210 Nicotine dependence, cigarettes, uncomplicated: Secondary | ICD-10-CM | POA: Diagnosis not present

## 2020-10-22 DIAGNOSIS — M79661 Pain in right lower leg: Secondary | ICD-10-CM | POA: Diagnosis not present

## 2020-10-22 DIAGNOSIS — J209 Acute bronchitis, unspecified: Secondary | ICD-10-CM | POA: Diagnosis not present

## 2020-10-29 DIAGNOSIS — J209 Acute bronchitis, unspecified: Secondary | ICD-10-CM | POA: Diagnosis not present

## 2020-10-29 DIAGNOSIS — M79661 Pain in right lower leg: Secondary | ICD-10-CM | POA: Diagnosis not present

## 2020-11-17 DIAGNOSIS — F17210 Nicotine dependence, cigarettes, uncomplicated: Secondary | ICD-10-CM | POA: Diagnosis not present

## 2020-11-17 DIAGNOSIS — F17200 Nicotine dependence, unspecified, uncomplicated: Secondary | ICD-10-CM | POA: Diagnosis not present

## 2020-11-17 DIAGNOSIS — Z79899 Other long term (current) drug therapy: Secondary | ICD-10-CM | POA: Diagnosis not present

## 2020-11-17 DIAGNOSIS — G8929 Other chronic pain: Secondary | ICD-10-CM | POA: Diagnosis not present

## 2020-11-17 DIAGNOSIS — G894 Chronic pain syndrome: Secondary | ICD-10-CM | POA: Diagnosis not present

## 2020-11-17 DIAGNOSIS — M545 Low back pain, unspecified: Secondary | ICD-10-CM | POA: Diagnosis not present

## 2020-11-18 DIAGNOSIS — I420 Dilated cardiomyopathy: Secondary | ICD-10-CM | POA: Diagnosis not present

## 2020-11-18 DIAGNOSIS — R0602 Shortness of breath: Secondary | ICD-10-CM | POA: Diagnosis not present

## 2020-11-18 DIAGNOSIS — I509 Heart failure, unspecified: Secondary | ICD-10-CM | POA: Diagnosis not present

## 2020-12-01 ENCOUNTER — Other Ambulatory Visit: Payer: Self-pay

## 2020-12-01 ENCOUNTER — Other Ambulatory Visit: Payer: Self-pay | Admitting: Internal Medicine

## 2020-12-01 ENCOUNTER — Ambulatory Visit
Admission: RE | Admit: 2020-12-01 | Discharge: 2020-12-01 | Disposition: A | Discharge status: Home / Self Care | Payer: Medicare Other | Source: Ambulatory Visit | Attending: Internal Medicine | Admitting: Internal Medicine

## 2020-12-01 DIAGNOSIS — M7989 Other specified soft tissue disorders: Secondary | ICD-10-CM | POA: Insufficient documentation

## 2020-12-01 DIAGNOSIS — I509 Heart failure, unspecified: Secondary | ICD-10-CM | POA: Diagnosis not present

## 2020-12-01 DIAGNOSIS — R69 Illness, unspecified: Secondary | ICD-10-CM | POA: Diagnosis not present

## 2020-12-01 DIAGNOSIS — E785 Hyperlipidemia, unspecified: Secondary | ICD-10-CM | POA: Diagnosis not present

## 2020-12-01 DIAGNOSIS — I10 Essential (primary) hypertension: Secondary | ICD-10-CM | POA: Diagnosis not present

## 2020-12-01 DIAGNOSIS — N189 Chronic kidney disease, unspecified: Secondary | ICD-10-CM | POA: Diagnosis not present

## 2020-12-05 DIAGNOSIS — E785 Hyperlipidemia, unspecified: Secondary | ICD-10-CM | POA: Diagnosis not present

## 2020-12-05 DIAGNOSIS — M109 Gout, unspecified: Secondary | ICD-10-CM | POA: Diagnosis not present

## 2020-12-05 DIAGNOSIS — M5489 Other dorsalgia: Secondary | ICD-10-CM | POA: Diagnosis not present

## 2020-12-05 DIAGNOSIS — I10 Essential (primary) hypertension: Secondary | ICD-10-CM | POA: Diagnosis not present

## 2020-12-05 DIAGNOSIS — I509 Heart failure, unspecified: Secondary | ICD-10-CM | POA: Diagnosis not present

## 2020-12-05 DIAGNOSIS — R7302 Impaired glucose tolerance (oral): Secondary | ICD-10-CM | POA: Diagnosis not present

## 2020-12-05 DIAGNOSIS — N189 Chronic kidney disease, unspecified: Secondary | ICD-10-CM | POA: Diagnosis not present

## 2020-12-13 ENCOUNTER — Encounter: Payer: Self-pay | Admitting: Emergency Medicine

## 2020-12-13 ENCOUNTER — Emergency Department: Payer: Medicare Other

## 2020-12-13 ENCOUNTER — Other Ambulatory Visit: Payer: Self-pay

## 2020-12-13 DIAGNOSIS — F17210 Nicotine dependence, cigarettes, uncomplicated: Secondary | ICD-10-CM | POA: Diagnosis present

## 2020-12-13 DIAGNOSIS — I429 Cardiomyopathy, unspecified: Secondary | ICD-10-CM | POA: Diagnosis not present

## 2020-12-13 DIAGNOSIS — E871 Hypo-osmolality and hyponatremia: Secondary | ICD-10-CM | POA: Diagnosis present

## 2020-12-13 DIAGNOSIS — R059 Cough, unspecified: Secondary | ICD-10-CM | POA: Diagnosis not present

## 2020-12-13 DIAGNOSIS — M479 Spondylosis, unspecified: Secondary | ICD-10-CM | POA: Diagnosis present

## 2020-12-13 DIAGNOSIS — Z79899 Other long term (current) drug therapy: Secondary | ICD-10-CM

## 2020-12-13 DIAGNOSIS — Z20822 Contact with and (suspected) exposure to covid-19: Secondary | ICD-10-CM | POA: Diagnosis not present

## 2020-12-13 DIAGNOSIS — Z7984 Long term (current) use of oral hypoglycemic drugs: Secondary | ICD-10-CM

## 2020-12-13 DIAGNOSIS — R6881 Early satiety: Secondary | ICD-10-CM | POA: Diagnosis present

## 2020-12-13 DIAGNOSIS — J9601 Acute respiratory failure with hypoxia: Secondary | ICD-10-CM | POA: Diagnosis present

## 2020-12-13 DIAGNOSIS — I110 Hypertensive heart disease with heart failure: Secondary | ICD-10-CM | POA: Diagnosis present

## 2020-12-13 DIAGNOSIS — M170 Bilateral primary osteoarthritis of knee: Secondary | ICD-10-CM | POA: Diagnosis present

## 2020-12-13 DIAGNOSIS — G8929 Other chronic pain: Secondary | ICD-10-CM | POA: Diagnosis not present

## 2020-12-13 DIAGNOSIS — I2510 Atherosclerotic heart disease of native coronary artery without angina pectoris: Secondary | ICD-10-CM | POA: Diagnosis present

## 2020-12-13 DIAGNOSIS — E878 Other disorders of electrolyte and fluid balance, not elsewhere classified: Secondary | ICD-10-CM | POA: Diagnosis present

## 2020-12-13 DIAGNOSIS — Z8619 Personal history of other infectious and parasitic diseases: Secondary | ICD-10-CM | POA: Diagnosis not present

## 2020-12-13 DIAGNOSIS — E785 Hyperlipidemia, unspecified: Secondary | ICD-10-CM | POA: Diagnosis present

## 2020-12-13 DIAGNOSIS — I517 Cardiomegaly: Secondary | ICD-10-CM | POA: Diagnosis not present

## 2020-12-13 DIAGNOSIS — I459 Conduction disorder, unspecified: Secondary | ICD-10-CM | POA: Diagnosis present

## 2020-12-13 DIAGNOSIS — Z825 Family history of asthma and other chronic lower respiratory diseases: Secondary | ICD-10-CM

## 2020-12-13 DIAGNOSIS — Z79891 Long term (current) use of opiate analgesic: Secondary | ICD-10-CM

## 2020-12-13 DIAGNOSIS — R079 Chest pain, unspecified: Secondary | ICD-10-CM | POA: Diagnosis not present

## 2020-12-13 DIAGNOSIS — J441 Chronic obstructive pulmonary disease with (acute) exacerbation: Principal | ICD-10-CM | POA: Diagnosis present

## 2020-12-13 DIAGNOSIS — Z7902 Long term (current) use of antithrombotics/antiplatelets: Secondary | ICD-10-CM

## 2020-12-13 DIAGNOSIS — Z87442 Personal history of urinary calculi: Secondary | ICD-10-CM | POA: Diagnosis not present

## 2020-12-13 DIAGNOSIS — M549 Dorsalgia, unspecified: Secondary | ICD-10-CM | POA: Diagnosis present

## 2020-12-13 DIAGNOSIS — I5041 Acute combined systolic (congestive) and diastolic (congestive) heart failure: Secondary | ICD-10-CM | POA: Diagnosis not present

## 2020-12-13 DIAGNOSIS — Z885 Allergy status to narcotic agent status: Secondary | ICD-10-CM | POA: Diagnosis not present

## 2020-12-13 DIAGNOSIS — R0600 Dyspnea, unspecified: Secondary | ICD-10-CM | POA: Diagnosis not present

## 2020-12-13 DIAGNOSIS — R0902 Hypoxemia: Secondary | ICD-10-CM | POA: Diagnosis not present

## 2020-12-13 NOTE — ED Triage Notes (Signed)
Pt reports that a week or two ago he had bronchitis, he states that he has had a cough. Now he has a sore throat. He states that he feels like his breathing is getting worse and would like a COVID test. Pt is able to speak in complete clear sentences. Lung sounds are a slight wheeze on inspiration.

## 2020-12-14 ENCOUNTER — Encounter: Payer: Self-pay | Admitting: Family Medicine

## 2020-12-14 ENCOUNTER — Other Ambulatory Visit: Payer: Self-pay

## 2020-12-14 ENCOUNTER — Inpatient Hospital Stay
Admission: EM | Admit: 2020-12-14 | Discharge: 2020-12-15 | DRG: 190 | Disposition: A | Discharge status: Home Health | Payer: Medicare Other | Attending: Internal Medicine | Admitting: Internal Medicine

## 2020-12-14 DIAGNOSIS — Z8619 Personal history of other infectious and parasitic diseases: Secondary | ICD-10-CM | POA: Diagnosis not present

## 2020-12-14 DIAGNOSIS — Z7984 Long term (current) use of oral hypoglycemic drugs: Secondary | ICD-10-CM | POA: Diagnosis not present

## 2020-12-14 DIAGNOSIS — R079 Chest pain, unspecified: Secondary | ICD-10-CM | POA: Diagnosis not present

## 2020-12-14 DIAGNOSIS — R0902 Hypoxemia: Secondary | ICD-10-CM

## 2020-12-14 DIAGNOSIS — Z720 Tobacco use: Secondary | ICD-10-CM | POA: Diagnosis not present

## 2020-12-14 DIAGNOSIS — I2510 Atherosclerotic heart disease of native coronary artery without angina pectoris: Secondary | ICD-10-CM | POA: Diagnosis present

## 2020-12-14 DIAGNOSIS — I5023 Acute on chronic systolic (congestive) heart failure: Secondary | ICD-10-CM | POA: Diagnosis not present

## 2020-12-14 DIAGNOSIS — J441 Chronic obstructive pulmonary disease with (acute) exacerbation: Secondary | ICD-10-CM | POA: Diagnosis not present

## 2020-12-14 DIAGNOSIS — I10 Essential (primary) hypertension: Secondary | ICD-10-CM | POA: Diagnosis present

## 2020-12-14 DIAGNOSIS — I517 Cardiomegaly: Secondary | ICD-10-CM | POA: Diagnosis not present

## 2020-12-14 DIAGNOSIS — Z7902 Long term (current) use of antithrombotics/antiplatelets: Secondary | ICD-10-CM | POA: Diagnosis not present

## 2020-12-14 DIAGNOSIS — R6881 Early satiety: Secondary | ICD-10-CM | POA: Diagnosis present

## 2020-12-14 DIAGNOSIS — E878 Other disorders of electrolyte and fluid balance, not elsewhere classified: Secondary | ICD-10-CM | POA: Diagnosis present

## 2020-12-14 DIAGNOSIS — M170 Bilateral primary osteoarthritis of knee: Secondary | ICD-10-CM | POA: Diagnosis present

## 2020-12-14 DIAGNOSIS — Z20822 Contact with and (suspected) exposure to covid-19: Secondary | ICD-10-CM | POA: Diagnosis present

## 2020-12-14 DIAGNOSIS — E871 Hypo-osmolality and hyponatremia: Secondary | ICD-10-CM | POA: Diagnosis not present

## 2020-12-14 DIAGNOSIS — E785 Hyperlipidemia, unspecified: Secondary | ICD-10-CM | POA: Diagnosis present

## 2020-12-14 DIAGNOSIS — Z825 Family history of asthma and other chronic lower respiratory diseases: Secondary | ICD-10-CM | POA: Diagnosis not present

## 2020-12-14 DIAGNOSIS — Z79891 Long term (current) use of opiate analgesic: Secondary | ICD-10-CM | POA: Diagnosis not present

## 2020-12-14 DIAGNOSIS — I5043 Acute on chronic combined systolic (congestive) and diastolic (congestive) heart failure: Secondary | ICD-10-CM | POA: Diagnosis not present

## 2020-12-14 DIAGNOSIS — I5021 Acute systolic (congestive) heart failure: Secondary | ICD-10-CM | POA: Diagnosis not present

## 2020-12-14 DIAGNOSIS — M549 Dorsalgia, unspecified: Secondary | ICD-10-CM | POA: Diagnosis present

## 2020-12-14 DIAGNOSIS — R059 Cough, unspecified: Secondary | ICD-10-CM | POA: Diagnosis present

## 2020-12-14 DIAGNOSIS — I429 Cardiomyopathy, unspecified: Secondary | ICD-10-CM | POA: Diagnosis present

## 2020-12-14 DIAGNOSIS — Z87442 Personal history of urinary calculi: Secondary | ICD-10-CM | POA: Diagnosis not present

## 2020-12-14 DIAGNOSIS — I459 Conduction disorder, unspecified: Secondary | ICD-10-CM | POA: Diagnosis present

## 2020-12-14 DIAGNOSIS — Z885 Allergy status to narcotic agent status: Secondary | ICD-10-CM | POA: Diagnosis not present

## 2020-12-14 DIAGNOSIS — I110 Hypertensive heart disease with heart failure: Secondary | ICD-10-CM | POA: Diagnosis present

## 2020-12-14 DIAGNOSIS — R0600 Dyspnea, unspecified: Secondary | ICD-10-CM | POA: Diagnosis not present

## 2020-12-14 DIAGNOSIS — I5041 Acute combined systolic (congestive) and diastolic (congestive) heart failure: Secondary | ICD-10-CM | POA: Diagnosis present

## 2020-12-14 DIAGNOSIS — J9601 Acute respiratory failure with hypoxia: Secondary | ICD-10-CM | POA: Diagnosis present

## 2020-12-14 DIAGNOSIS — G8929 Other chronic pain: Secondary | ICD-10-CM | POA: Diagnosis present

## 2020-12-14 DIAGNOSIS — F17210 Nicotine dependence, cigarettes, uncomplicated: Secondary | ICD-10-CM | POA: Diagnosis present

## 2020-12-14 DIAGNOSIS — M479 Spondylosis, unspecified: Secondary | ICD-10-CM | POA: Diagnosis present

## 2020-12-14 HISTORY — DX: Anxiety disorder, unspecified: F41.9

## 2020-12-14 LAB — CBC WITH DIFFERENTIAL/PLATELET
Abs Immature Granulocytes: 0.01 10*3/uL (ref 0.00–0.07)
Basophils Absolute: 0.1 10*3/uL (ref 0.0–0.1)
Basophils Relative: 1 %
Eosinophils Absolute: 0.2 10*3/uL (ref 0.0–0.5)
Eosinophils Relative: 3 %
HCT: 37.4 % — ABNORMAL LOW (ref 39.0–52.0)
Hemoglobin: 12.6 g/dL — ABNORMAL LOW (ref 13.0–17.0)
Immature Granulocytes: 0 %
Lymphocytes Relative: 11 %
Lymphs Abs: 0.7 10*3/uL (ref 0.7–4.0)
MCH: 26.4 pg (ref 26.0–34.0)
MCHC: 33.7 g/dL (ref 30.0–36.0)
MCV: 78.4 fL — ABNORMAL LOW (ref 80.0–100.0)
Monocytes Absolute: 0.6 10*3/uL (ref 0.1–1.0)
Monocytes Relative: 10 %
Neutro Abs: 4.7 10*3/uL (ref 1.7–7.7)
Neutrophils Relative %: 75 %
Platelets: 281 10*3/uL (ref 150–400)
RBC: 4.77 MIL/uL (ref 4.22–5.81)
RDW: 15.8 % — ABNORMAL HIGH (ref 11.5–15.5)
WBC: 6.3 10*3/uL (ref 4.0–10.5)
nRBC: 0 % (ref 0.0–0.2)

## 2020-12-14 LAB — BRAIN NATRIURETIC PEPTIDE: B Natriuretic Peptide: 4355.4 pg/mL — ABNORMAL HIGH (ref 0.0–100.0)

## 2020-12-14 LAB — BASIC METABOLIC PANEL
Anion gap: 13 (ref 5–15)
Anion gap: 9 (ref 5–15)
BUN: 28 mg/dL — ABNORMAL HIGH (ref 8–23)
BUN: 30 mg/dL — ABNORMAL HIGH (ref 8–23)
CO2: 24 mmol/L (ref 22–32)
CO2: 26 mmol/L (ref 22–32)
Calcium: 8.7 mg/dL — ABNORMAL LOW (ref 8.9–10.3)
Calcium: 9 mg/dL (ref 8.9–10.3)
Chloride: 92 mmol/L — ABNORMAL LOW (ref 98–111)
Chloride: 93 mmol/L — ABNORMAL LOW (ref 98–111)
Creatinine, Ser: 1.53 mg/dL — ABNORMAL HIGH (ref 0.61–1.24)
Creatinine, Ser: 1.57 mg/dL — ABNORMAL HIGH (ref 0.61–1.24)
GFR, Estimated: 50 mL/min — ABNORMAL LOW (ref >60)
GFR, Estimated: 51 mL/min — ABNORMAL LOW (ref >60)
Glucose, Bld: 111 mg/dL — ABNORMAL HIGH (ref 70–99)
Glucose, Bld: 136 mg/dL — ABNORMAL HIGH (ref 70–99)
Potassium: 4.5 mmol/L (ref 3.5–5.1)
Potassium: 4.7 mmol/L (ref 3.5–5.1)
Sodium: 128 mmol/L — ABNORMAL LOW (ref 135–145)
Sodium: 129 mmol/L — ABNORMAL LOW (ref 135–145)

## 2020-12-14 LAB — RESP PANEL BY RT-PCR (FLU A&B, COVID) ARPGX2
Influenza A by PCR: NEGATIVE
Influenza B by PCR: NEGATIVE
SARS Coronavirus 2 by RT PCR: NEGATIVE

## 2020-12-14 LAB — CBC
HCT: 41 % (ref 39.0–52.0)
Hemoglobin: 13.5 g/dL (ref 13.0–17.0)
MCH: 26 pg (ref 26.0–34.0)
MCHC: 32.9 g/dL (ref 30.0–36.0)
MCV: 78.8 fL — ABNORMAL LOW (ref 80.0–100.0)
Platelets: 286 10*3/uL (ref 150–400)
RBC: 5.2 MIL/uL (ref 4.22–5.81)
RDW: 15.9 % — ABNORMAL HIGH (ref 11.5–15.5)
WBC: 8.6 10*3/uL (ref 4.0–10.5)
nRBC: 0 % (ref 0.0–0.2)

## 2020-12-14 LAB — EXPECTORATED SPUTUM ASSESSMENT W GRAM STAIN, RFLX TO RESP C

## 2020-12-14 LAB — TROPONIN I (HIGH SENSITIVITY): Troponin I (High Sensitivity): 29 ng/L — ABNORMAL HIGH (ref <18)

## 2020-12-14 LAB — HIV ANTIBODY (ROUTINE TESTING W REFLEX): HIV Screen 4th Generation wRfx: NONREACTIVE

## 2020-12-14 MED ORDER — SPIRONOLACTONE 25 MG PO TABS: 12.5000 mg | ORAL_TABLET | Freq: Every day | ORAL | Status: DC

## 2020-12-14 MED ORDER — GUAIFENESIN ER 600 MG PO TB12
600.0000 mg | ORAL_TABLET | Freq: Two times a day (BID) | ORAL | Status: DC
  Administered 2020-12-14 – 2020-12-15 (×3): 600 mg via ORAL
  Filled 2020-12-14 (×3): qty 1

## 2020-12-14 MED ORDER — ALBUTEROL SULFATE HFA 108 (90 BASE) MCG/ACT IN AERS: 2.0000 | INHALATION_SPRAY | Freq: Four times a day (QID) | RESPIRATORY_TRACT | Status: DC | PRN

## 2020-12-14 MED ORDER — ONDANSETRON HCL 4 MG/2ML IJ SOLN: 4.0000 mg | Freq: Four times a day (QID) | INTRAMUSCULAR | Status: DC | PRN

## 2020-12-14 MED ORDER — HYDROCOD POLST-CPM POLST ER 10-8 MG/5ML PO SUER
5.0000 mL | Freq: Once | ORAL | Status: AC
Start: 2020-12-14 — End: 2020-12-14
  Administered 2020-12-14: 5 mL via ORAL
  Filled 2020-12-14: qty 5

## 2020-12-14 MED ORDER — IPRATROPIUM-ALBUTEROL 0.5-2.5 (3) MG/3ML IN SOLN
3.0000 mL | Freq: Once | RESPIRATORY_TRACT | Status: AC
Start: 2020-12-14 — End: 2020-12-14
  Administered 2020-12-14: 3 mL via RESPIRATORY_TRACT
  Filled 2020-12-14: qty 3

## 2020-12-14 MED ORDER — SODIUM CHLORIDE 0.9 % IV SOLN
1.0000 g | INTRAVENOUS | Status: DC
  Administered 2020-12-14 – 2020-12-15 (×2): 1 g via INTRAVENOUS
  Filled 2020-12-14: qty 1
  Filled 2020-12-14: qty 10
  Filled 2020-12-14: qty 1

## 2020-12-14 MED ORDER — ACETAMINOPHEN 650 MG RE SUPP: 650.0000 mg | Freq: Four times a day (QID) | RECTAL | Status: DC | PRN

## 2020-12-14 MED ORDER — IPRATROPIUM-ALBUTEROL 0.5-2.5 (3) MG/3ML IN SOLN
3.0000 mL | Freq: Three times a day (TID) | RESPIRATORY_TRACT | Status: DC
  Administered 2020-12-14 – 2020-12-15 (×3): 3 mL via RESPIRATORY_TRACT
  Filled 2020-12-14 (×4): qty 3

## 2020-12-14 MED ORDER — ALPRAZOLAM 1 MG PO TABS
1.0000 mg | ORAL_TABLET | Freq: Three times a day (TID) | ORAL | Status: DC
  Administered 2020-12-14 – 2020-12-15 (×5): 1 mg via ORAL
  Filled 2020-12-14 (×5): qty 1

## 2020-12-14 MED ORDER — SODIUM CHLORIDE 0.9 % IV BOLUS
1000.0000 mL | Freq: Once | INTRAVENOUS | Status: AC
  Administered 2020-12-14: 1000 mL via INTRAVENOUS

## 2020-12-14 MED ORDER — LOSARTAN POTASSIUM 50 MG PO TABS: 50.0000 mg | ORAL_TABLET | Freq: Every day | ORAL | Status: DC

## 2020-12-14 MED ORDER — IPRATROPIUM-ALBUTEROL 0.5-2.5 (3) MG/3ML IN SOLN
3.0000 mL | Freq: Four times a day (QID) | RESPIRATORY_TRACT | Status: DC
  Administered 2020-12-14: 08:00:00 3 mL via RESPIRATORY_TRACT
  Filled 2020-12-14: qty 3

## 2020-12-14 MED ORDER — ATORVASTATIN CALCIUM 20 MG PO TABS
80.0000 mg | ORAL_TABLET | Freq: Every day | ORAL | Status: DC
  Administered 2020-12-14 – 2020-12-15 (×2): 80 mg via ORAL
  Filled 2020-12-14 (×2): qty 4

## 2020-12-14 MED ORDER — CLOPIDOGREL BISULFATE 75 MG PO TABS
75.0000 mg | ORAL_TABLET | Freq: Every day | ORAL | Status: DC
Start: 2020-12-14 — End: 2020-12-15
  Administered 2020-12-14 – 2020-12-15 (×2): 75 mg via ORAL
  Filled 2020-12-14 (×2): qty 1

## 2020-12-14 MED ORDER — ENOXAPARIN SODIUM 40 MG/0.4ML IJ SOSY
40.0000 mg | PREFILLED_SYRINGE | INTRAMUSCULAR | Status: DC
  Filled 2020-12-14 (×2): qty 0.4

## 2020-12-14 MED ORDER — HYDROCOD POLST-CPM POLST ER 10-8 MG/5ML PO SUER
5.0000 mL | Freq: Two times a day (BID) | ORAL | Status: DC | PRN
  Administered 2020-12-14 – 2020-12-15 (×2): 5 mL via ORAL
  Filled 2020-12-14 (×2): qty 5

## 2020-12-14 MED ORDER — MAGNESIUM HYDROXIDE 400 MG/5ML PO SUSP
30.0000 mL | Freq: Every day | ORAL | Status: DC | PRN
  Filled 2020-12-14: qty 30

## 2020-12-14 MED ORDER — OXYCODONE HCL 5 MG PO TABS: 15.0000 mg | ORAL_TABLET | Freq: Four times a day (QID) | ORAL | Status: DC

## 2020-12-14 MED ORDER — TRAZODONE HCL 50 MG PO TABS
25.0000 mg | ORAL_TABLET | Freq: Every evening | ORAL | Status: DC | PRN
  Administered 2020-12-14: 21:00:00 25 mg via ORAL
  Filled 2020-12-14: qty 1

## 2020-12-14 MED ORDER — METHYLPREDNISOLONE SODIUM SUCC 40 MG IJ SOLR
40.0000 mg | Freq: Three times a day (TID) | INTRAMUSCULAR | Status: AC
  Administered 2020-12-14 (×3): 40 mg via INTRAVENOUS
  Filled 2020-12-14 (×3): qty 1

## 2020-12-14 MED ORDER — FUROSEMIDE 10 MG/ML IJ SOLN
40.0000 mg | Freq: Two times a day (BID) | INTRAMUSCULAR | Status: DC
  Administered 2020-12-14 – 2020-12-15 (×3): 40 mg via INTRAVENOUS
  Filled 2020-12-14 (×3): qty 4

## 2020-12-14 MED ORDER — FUROSEMIDE 10 MG/ML IJ SOLN: 40.0000 mg | Freq: Two times a day (BID) | INTRAMUSCULAR | Status: DC

## 2020-12-14 MED ORDER — IPRATROPIUM-ALBUTEROL 0.5-2.5 (3) MG/3ML IN SOLN
3.0000 mL | Freq: Once | RESPIRATORY_TRACT | Status: AC
  Administered 2020-12-14: 3 mL via RESPIRATORY_TRACT
  Filled 2020-12-14: qty 3

## 2020-12-14 MED ORDER — METHYLPREDNISOLONE SODIUM SUCC 125 MG IJ SOLR
125.0000 mg | Freq: Once | INTRAMUSCULAR | Status: AC
  Administered 2020-12-14: 125 mg via INTRAVENOUS
  Filled 2020-12-14: qty 2

## 2020-12-14 MED ORDER — CARVEDILOL 25 MG PO TABS
25.0000 mg | ORAL_TABLET | Freq: Two times a day (BID) | ORAL | Status: DC
  Administered 2020-12-14 – 2020-12-15 (×3): 25 mg via ORAL
  Filled 2020-12-14 (×3): qty 1

## 2020-12-14 MED ORDER — POTASSIUM CHLORIDE CRYS ER 20 MEQ PO TBCR
40.0000 meq | EXTENDED_RELEASE_TABLET | Freq: Once | ORAL | Status: AC
  Administered 2020-12-14: 16:00:00 40 meq via ORAL
  Filled 2020-12-14: qty 2

## 2020-12-14 MED ORDER — ROSUVASTATIN CALCIUM 20 MG PO TABS: 40.0000 mg | ORAL_TABLET | Freq: Every evening | ORAL | Status: DC

## 2020-12-14 MED ORDER — FUROSEMIDE 10 MG/ML IJ SOLN
40.0000 mg | Freq: Once | INTRAMUSCULAR | Status: AC
  Administered 2020-12-14: 16:00:00 40 mg via INTRAVENOUS
  Filled 2020-12-14: qty 4

## 2020-12-14 MED ORDER — ACETAMINOPHEN 325 MG PO TABS: 650.0000 mg | ORAL_TABLET | Freq: Four times a day (QID) | ORAL | Status: DC | PRN

## 2020-12-14 MED ORDER — ALBUTEROL SULFATE (2.5 MG/3ML) 0.083% IN NEBU: 2.5000 mg | INHALATION_SOLUTION | Freq: Four times a day (QID) | RESPIRATORY_TRACT | Status: DC | PRN

## 2020-12-14 MED ORDER — ONDANSETRON HCL 4 MG PO TABS: 4.0000 mg | ORAL_TABLET | Freq: Four times a day (QID) | ORAL | Status: DC | PRN

## 2020-12-14 MED ORDER — FLUTICASONE PROPIONATE 50 MCG/ACT NA SUSP
1.0000 | Freq: Every day | NASAL | Status: DC | PRN
  Administered 2020-12-14: 1 via NASAL
  Filled 2020-12-14 (×2): qty 16

## 2020-12-14 MED ORDER — PREDNISONE 20 MG PO TABS
40.0000 mg | ORAL_TABLET | Freq: Every day | ORAL | Status: DC
  Administered 2020-12-15: 40 mg via ORAL
  Filled 2020-12-14: qty 2

## 2020-12-14 MED ORDER — FUROSEMIDE 40 MG PO TABS: 40.0000 mg | ORAL_TABLET | Freq: Every day | ORAL | Status: DC | PRN

## 2020-12-14 MED ORDER — OXYCODONE HCL 5 MG PO TABS
15.0000 mg | ORAL_TABLET | Freq: Four times a day (QID) | ORAL | Status: DC
  Administered 2020-12-14 – 2020-12-15 (×6): 15 mg via ORAL
  Filled 2020-12-14 (×6): qty 3

## 2020-12-14 MED ORDER — ALPRAZOLAM 1 MG PO TABS: 1.0000 mg | ORAL_TABLET | Freq: Three times a day (TID) | ORAL | Status: DC

## 2020-12-14 NOTE — ED Notes (Signed)
Patient is resting comfortably. 

## 2020-12-14 NOTE — Progress Notes (Signed)
PROGRESS NOTE  [name] FBP:[id number] DOB: 05-Sep-1955 DOA: 12/14/2020 PCP: Perrin Maltese, MD  HPI/Recap of past 57 hours: 65 year old male with past medical history of hypertension, CAD, COPD who presented to the emergency room on the night of 6/4 with complaints of cough and shortness of breath times several days and admitted to the hospital service for COPD exacerbation and suspected acute systolic CHF.  Patient tachypneic and put on 2 L nasal cannula, not reportedly hypoxic.  BNP came back elevated at 4300.  Patient started on nebulizers, steroids and IV Lasix.  Since admission has diuresed almost 2.5 L.  Patient states his breathing is better than on admission.  He initially wanted to go home, but was convinced to stay.  Assessment/Plan: Active Problems:   Essential hypertension: Pressures have remained stable.  Continue home medications.  Acute congestive heart failure, suspected systolic: Continue IV Lasix.  BNP elevated at 4300 on admission.  Checking echocardiogram.    CAD (coronary artery disease): Stable.    COPD exacerbation ([place]): Continue nebulizers and steroids.  Right leg numbness: Patient states at times that he has right leg pain below the knee and foot falls asleep.  Has had vascular work-up.  We reviewed his previous MRI in 2008 after lumbar fusion surgery.  I suspected he may have some spinal nerve impingement.  I told him he needs to consider an MRI and he can discuss this with his primary physician.  Code Status: Full code  Family Communication: Left message for wife  Disposition Plan: Ideally, he would be discharged once he is fully diuresed, he was adamant about leaving today, but was able to convince him to stay 1 additional day while we diurese him as much as possible.   Consultants:  None  Procedures:  Echo pending  Antimicrobials:  IV Rocephin 6/5-present  DVT prophylaxis: Lovenox  Level of care: Med-Surg   Objective: Vitals:    12/14/20 0727 12/14/20 0745  BP: 125/81   Pulse: 85   Resp: 20   Temp: 97.8 F (36.6 C)   SpO2: 93% 97%    Intake/Output Summary (Last 24 hours) at 12/14/2020 1107 Last data filed at 12/14/2020 8242 Gross per 24 hour  Intake 986.32 ml  Output 1650 ml  Net -663.68 ml   Filed Weights   12/13/20 2349 12/14/20 0421  Weight: 65.8 kg 72.9 kg   Body mass index is 25.17 kg/m.  Exam:   General: Alert and oriented x3, no acute distress although breathing labored even at rest.  Cardiovascular: Regular rate and rhythm, S1-S2, 2 out of 6 systolic ejection murmur  Respiratory: Decreased breath sounds bilaterally, end expiratory wheeze on left  Abdomen: Soft, nontender, nondistended, positive bowel sounds  Musculoskeletal: 1+ pitting edema bilaterally  Skin: No skin breaks, tears or lesions  Psychiatry: Appropriate, no evidence of psychoses  Neurology: No focal deficits   Data Reviewed: CBC: Recent Labs  Lab 12/13/20 2353 12/14/20 0613  WBC 6.3 8.6  NEUTROABS 4.7  --   HGB 12.6* 13.5  HCT 37.4* 41.0  MCV 78.4* 78.8*  PLT 281 353   Basic Metabolic Panel: Recent Labs  Lab 12/13/20 2353 12/14/20 0613  NA 128* 129*  K 4.7 4.5  CL 93* 92*  CO2 26 24  GLUCOSE 111* 136*  BUN 30* 28*  CREATININE 1.53* 1.57*  CALCIUM 8.7* 9.0   GFR: Estimated Creatinine Clearance: 45.6 mL/min (A) (by C-G formula based on SCr of 1.57 mg/dL (H)). Liver Function Tests: No results  for input(s): AST, ALT, ALKPHOS, BILITOT, PROT, ALBUMIN in the last 168 hours. No results for input(s): LIPASE, AMYLASE in the last 168 hours. No results for input(s): AMMONIA in the last 168 hours. Coagulation Profile: No results for input(s): INR, PROTIME in the last 168 hours. Cardiac Enzymes: No results for input(s): CKTOTAL, CKMB, CKMBINDEX, TROPONINI in the last 168 hours. BNP (last 3 results) No results for input(s): PROBNP in the last 8760 hours. HbA1C: No results for input(s): HGBA1C in the last  72 hours. CBG: No results for input(s): GLUCAP in the last 168 hours. Lipid Profile: No results for input(s): CHOL, HDL, LDLCALC, TRIG, CHOLHDL, LDLDIRECT in the last 72 hours. Thyroid Function Tests: No results for input(s): TSH, T4TOTAL, FREET4, T3FREE, THYROIDAB in the last 72 hours. Anemia Panel: No results for input(s): VITAMINB12, FOLATE, FERRITIN, TIBC, IRON, RETICCTPCT in the last 72 hours. Urine analysis:    Component Value Date/Time   COLORURINE YELLOW (A) 01/29/2020 1737   APPEARANCEUR CLEAR (A) 01/29/2020 1737   APPEARANCEUR Hazy (A) 05/17/2019 1323   LABSPEC 1.014 01/29/2020 1737   LABSPEC 1.032 03/23/2014 1721   PHURINE 7.0 01/29/2020 1737   GLUCOSEU NEGATIVE 01/29/2020 1737   GLUCOSEU Negative 03/23/2014 1721   HGBUR MODERATE (A) 01/29/2020 1737   BILIRUBINUR NEGATIVE 01/29/2020 1737   BILIRUBINUR Negative 05/17/2019 1323   BILIRUBINUR Negative 03/23/2014 1721   KETONESUR NEGATIVE 01/29/2020 1737   PROTEINUR 30 (A) 01/29/2020 1737   UROBILINOGEN 0.2 08/07/2007 1255   NITRITE NEGATIVE 01/29/2020 1737   LEUKOCYTESUR NEGATIVE 01/29/2020 1737   LEUKOCYTESUR Trace 03/23/2014 1721   Sepsis Labs: @LABRCNTIP (procalcitonin:4,lacticidven:4)  ) Recent Results (from the past 240 hour(s))  Resp Panel by RT-PCR (Flu A&B, Covid) Nasopharyngeal Swab     Status: None   Collection Time: 12/14/20 12:56 AM   Specimen: Nasopharyngeal Swab; Nasopharyngeal(NP) swabs in vial transport medium  Result Value Ref Range Status   SARS Coronavirus 2 by RT PCR NEGATIVE NEGATIVE Final    Comment: (NOTE) SARS-CoV-2 target nucleic acids are NOT DETECTED.  The SARS-CoV-2 RNA is generally detectable in upper respiratory specimens during the acute phase of infection. The lowest concentration of SARS-CoV-2 viral copies this assay can detect is 138 copies/mL. A negative result does not preclude SARS-Cov-2 infection and should not be used as the sole basis for treatment or other patient  management decisions. A negative result may occur with  improper specimen collection/handling, submission of specimen other than nasopharyngeal swab, presence of viral mutation(s) within the areas targeted by this assay, and inadequate number of viral copies(<138 copies/mL). A negative result must be combined with clinical observations, patient history, and epidemiological information. The expected result is Negative.  Fact Sheet for Patients:  EntrepreneurPulse.com.au  Fact Sheet for Healthcare Providers:  IncredibleEmployment.be  This test is no t yet approved or cleared by the [place] FDA and  has been authorized for detection and/or diagnosis of SARS-CoV-2 by FDA under an Emergency Use Authorization (EUA). This EUA will remain  in effect (meaning this test can be used) for the duration of the COVID-19 declaration under Section 564(b)(1) of the Act, 21 U.S.C.section 360bbb-3(b)(1), unless the authorization is terminated  or revoked sooner.       Influenza A by PCR NEGATIVE NEGATIVE Final   Influenza B by PCR NEGATIVE NEGATIVE Final    Comment: (NOTE) The Xpert Xpress SARS-CoV-2/FLU/RSV plus assay is intended as an aid in the diagnosis of influenza from Nasopharyngeal swab specimens and should not be used as a sole  basis for treatment. Nasal washings and aspirates are unacceptable for Xpert Xpress SARS-CoV-2/FLU/RSV testing.  Fact Sheet for Patients: EntrepreneurPulse.com.au  Fact Sheet for Healthcare Providers: IncredibleEmployment.be  This test is not yet approved or cleared by the [place] FDA and has been authorized for detection and/or diagnosis of SARS-CoV-2 by FDA under an Emergency Use Authorization (EUA). This EUA will remain in effect (meaning this test can be used) for the duration of the COVID-19 declaration under Section 564(b)(1) of the Act, 21 U.S.C. section 360bbb-3(b)(1),  unless the authorization is terminated or revoked.  Performed at [facility], [address] [place], [place] [address]       Studies: DG Chest 2 View  Result Date: 12/14/2020 CLINICAL DATA:  Dyspnea, chest pain EXAM: CHEST - 2 VIEW COMPARISON:  12/04/2017 FINDINGS: The lungs are symmetrically well inflated. There has developed a a subtle reticulonodular infiltrate diffusely within the lungs, possibly related to changes of atypical infection in the appropriate clinical setting. No confluent pulmonary infiltrate. No pneumothorax or pleural effusion. Mild cardiomegaly is stable. No acute bone abnormality. IMPRESSION: Diffuse reticulonodular infiltrate, possibly related to changes of atypical infection. No confluent pulmonary infiltrate identified. Electronically Signed   By: Fidela Salisbury MD   On: 12/14/2020 01:12    Scheduled Meds: . ALPRAZolam  1 mg Oral TID  . atorvastatin  80 mg Oral Daily  . carvedilol  25 mg Oral BID  . clopidogrel  75 mg Oral Daily  . enoxaparin (LOVENOX) injection  40 mg Subcutaneous Q24H  . furosemide  40 mg Intravenous Q12H  . guaiFENesin  600 mg Oral BID  . ipratropium-albuterol  3 mL Nebulization TID  . methylPREDNISolone (SOLU-MEDROL) injection  40 mg Intravenous Q8H   Followed by  . [START ON 12/15/2020] predniSONE  40 mg Oral Q breakfast  . oxyCODONE  15 mg Oral QID    Continuous Infusions: . cefTRIAXone (ROCEPHIN)  IV       LOS: 0 days     Annita Brod, MD [facility]   12/14/2020, 11:07 AM

## 2020-12-14 NOTE — H&P (Signed)
[place]   PATIENT NAME: [name]    MR#:  [id number]  DATE OF BIRTH:  1955/08/11  DATE OF ADMISSION:  12/14/2020  PRIMARY CARE PHYSICIAN: Perrin Maltese, MD   Patient is coming from: Home.  REQUESTING/REFERRING PHYSICIAN: Lurline Hare, MD CHIEF COMPLAINT:   Chief Complaint  Patient presents with  . Cough  . Sore Throat    HISTORY OF PRESENT ILLNESS:  [name] is a 65 y.o. Caucasian male with medical history significant for multiple medical problems that are mentioned below, presented to the ER with acute onset of worsening cough for the last couple of days that has been going on over the last couple of weeks with occasional expectoration of clear sputum, with associated dyspnea and wheezing.  He admitted to lower extremity edema and orthopnea without paroxysmal nocturnal dyspnea.  He admits to dyspnea on exertion and was having conversational dyspnea as well.  No fever or chills.  No chest pain or palpitations.  No dysuria, oliguria or hematuria or flank pain. ED Course: Upon presentation to the emergency room vital signs were within normal and later respiratory rate was 22-24.  Labs revealed hyponatremia 128 and hypochloremia 93 with a BUN of 30 and creatinine 1.53 down from previous levels.  CBC was unremarkable.  Influenza antigens and COVID-19 PCR came back negative.  High-sensitivity troponin was 29.  BNP is currently pending. EKG as reviewed by me : .  EKG showed sinus rhythm with a rate of 77 with PVCs and left atrial enlargement with nonspecific intraventricular conduction delay Imaging:Two-view chest ray showed clear infiltrate, possibly related to changes of atypical infection with no confluent pulmonary infiltrate identified.  The patient was given duo nebs x3, IV Solu-Medrol, 1 L bolus of IV normal saline and Tussionex.  I added 40 mg of IV Lasix. The patient will be admitted to a medical monitored bed for further evaluation and management. PAST MEDICAL  HISTORY:   Past Medical History:  Diagnosis Date  . Arthritis    knees, lower back  . Bronchitis   . Cardiomyopathy ([place])   . CHF (congestive heart failure) ([place])   . Chronic back pain   . Complication of anesthesia    has trouble keeping pt asleep due to taking narcotics qid  . Coronary artery disease   . Diverticulitis   . Hepatitis C virus infection without hepatic coma    received treatment. clear now  . History of kidney stones   . Hypertension     PAST SURGICAL HISTORY:   Past Surgical History:  Procedure Laterality Date  . BACK SURGERY    . CARDIAC CATHETERIZATION    . CHOLECYSTECTOMY    . COLON RESECTION  2007   due to diverticulitis  . COLONOSCOPY WITH PROPOFOL N/A 03/29/2017   Procedure: COLONOSCOPY WITH PROPOFOL;  Surgeon: Lucilla Lame, MD;  Location: [facility] ENDOSCOPY;  Service: Endoscopy;  Laterality: N/A;  . CYSTOSCOPY/URETEROSCOPY/HOLMIUM LASER/STENT PLACEMENT Left 07/16/2019   Procedure: CYSTOSCOPY/URETEROSCOPY/HOLMIUM LASER/STENT Exchange;  Surgeon: Hollice Espy, MD;  Location: [facility] ORS;  Service: Urology;  Laterality: Left;  . ESOPHAGOGASTRODUODENOSCOPY (EGD) WITH PROPOFOL N/A 03/29/2017   Procedure: ESOPHAGOGASTRODUODENOSCOPY (EGD) WITH PROPOFOL;  Surgeon: Lucilla Lame, MD;  Location: [facility] ENDOSCOPY;  Service: Endoscopy;  Laterality: N/A;  . FINGER SURGERY  2007   cut finger off with saw and was reattached  . HERNIA REPAIR    . HOLMIUM LASER APPLICATION N/A 81/44/8185   Procedure: HOLMIUM LASER APPLICATION;  Surgeon: Hollice Espy,  MD;  Location: [facility] ORS;  Service: Urology;  Laterality: N/A;  . IR NEPHROSTOMY PLACEMENT LEFT  05/28/2019  . KIDNEY SURGERY  2021  . KNEE ARTHROSCOPY Bilateral   . LOWER EXTREMITY ANGIOGRAPHY Right 11/09/2017   Procedure: LOWER EXTREMITY ANGIOGRAPHY;  Surgeon: Katha Cabal, MD;  Location: [place] CV LAB;  Service: Cardiovascular;  Laterality: Right;  . LOWER EXTREMITY ANGIOGRAPHY Right 11/21/2018   Procedure: LOWER  EXTREMITY ANGIOGRAPHY;  Surgeon: Katha Cabal, MD;  Location: [place] CV LAB;  Service: Cardiovascular;  Laterality: Right;  . LOWER EXTREMITY ANGIOGRAPHY Right 09/18/2019   Procedure: LOWER EXTREMITY ANGIOGRAPHY;  Surgeon: Katha Cabal, MD;  Location: [facility] CV LAB;  Service: Cardiovascular;  Laterality: Right;  . NEPHROLITHOTOMY Left 05/28/2019   Procedure: NEPHROLITHOTOMY PERCUTANEOUS;  Surgeon: Hollice Espy, MD;  Location: [facility] ORS;  Service: Urology;  Laterality: Left;  . SPINAL FUSION      SOCIAL HISTORY:   Social History   Tobacco Use  . Smoking status: Current Every Day Smoker    Packs/day: 0.50    Years: 25.00    Pack years: 12.50    Types: Cigarettes  . Smokeless tobacco: Never Used  Substance Use Topics  . Alcohol use: No    FAMILY HISTORY:   Family History  Problem Relation Age of Onset  . COPD Mother     DRUG ALLERGIES:   Allergies  Allergen Reactions  . Codeine Itching  . Dilaudid [Hydromorphone] Other (See Comments)    Severe headache    REVIEW OF SYSTEMS:   ROS As per history of present illness. All pertinent systems were reviewed above. Constitutional, HEENT, cardiovascular, respiratory, GI, GU, musculoskeletal, neuro, psychiatric, endocrine, integumentary and hematologic systems were reviewed and are otherwise negative/unremarkable except for positive findings mentioned above in the HPI.   MEDICATIONS AT HOME:   Prior to Admission medications   Medication Sig Start Date End Date Taking? Authorizing Provider  albuterol (VENTOLIN HFA) 108 (90 Base) MCG/ACT inhaler Inhale 2 puffs into the lungs every 6 (six) hours as needed for wheezing or shortness of breath.    [provider]  ALPRAZolam Duanne Moron) 1 MG tablet Take 1 mg by mouth 3 (three) times daily.  01/06/16   [provider]  atorvastatin (LIPITOR) 80 MG tablet Take 1 tablet by mouth daily. 11/22/20   [provider]  carvedilol (COREG) 25 MG  tablet Take 25 mg by mouth 2 (two) times daily.     [provider]  cephALEXin (KEFLEX) 500 MG capsule Take 1 capsule (500 mg total) by mouth 3 (three) times daily. 01/02/20   Johnn Hai, PA-C  clopidogrel (PLAVIX) 75 MG tablet Take 1 tablet (75 mg total) by mouth daily. 11/09/17   Schnier, Dolores Lory, MD  diclofenac Sodium (VOLTAREN) 1 % GEL Apply 4 g topically 4 (four) times daily. 01/29/20   Nance Pear, MD  ENTRESTO 49-51 MG Take 1 tablet by mouth 2 (two) times daily. 10/14/20   [provider]  FARXIGA 10 MG TABS tablet Take 10 mg by mouth daily. 10/21/20   [provider]  fluticasone (FLONASE) 50 MCG/ACT nasal spray Place 1 spray into both nostrils daily as needed for allergies.  01/20/17   [provider]  furosemide (LASIX) 40 MG tablet Take 40 mg by mouth daily as needed (fluid retention.).    [provider]  ipratropium-albuterol (DUONEB) 0.5-2.5 (3) MG/3ML SOLN Take 3 mLs by nebulization every 4 (four) hours as needed (for SOB).  Patient taking differently: Take 3 mLs by nebulization every 4 (four) hours as needed (for shortness of breath).  12/05/17   Vaughan Basta, MD  losartan (COZAAR) 50 MG tablet Take 50 mg by mouth daily.  02/15/19 09/11/20  [provider]  meloxicam (MOBIC) 15 MG tablet Take 15 mg by mouth daily as needed for pain.     [provider]  oxyCODONE (ROXICODONE) 15 MG immediate release tablet Take 15 mg by mouth 4 (four) times daily.  05/07/19   [provider]  rosuvastatin (CRESTOR) 40 MG tablet Take 40 mg by mouth every evening.  09/20/18   [provider]  spironolactone (ALDACTONE) 25 MG tablet Take 12.5 mg by mouth daily.  05/07/19   [provider]      VITAL SIGNS:  Blood pressure 117/73, pulse 84, temperature 98 F (36.7 C), temperature source Oral, resp. rate 20, height 5\' 7"  (1.702 m), weight 65.8 kg, SpO2 93 %.  PHYSICAL EXAMINATION:  Physical  Exam  GENERAL:  65 y.o.-year-old Caucasian male patient lying in the bed with mild respiratory distress with conversational dyspnea. EYES: Pupils equal, round, reactive to light and accommodation. No scleral icterus. Extraocular muscles intact.  HEENT: Head atraumatic, normocephalic. Oropharynx and nasopharynx clear.  NECK:  Supple, no jugular venous distention. No thyroid enlargement, no tenderness.  LUNGS: Diminished bibasilar breath sounds with bibasal crackles, with diminished expiratory airflow and heart vesicular breathing as well as expiratory wheezes and rhonchi diffusely. CARDIOVASCULAR: Regular rate and rhythm, S1, S2 normal. No murmurs, rubs, or gallops.  ABDOMEN: Soft, nondistended, nontender. Bowel sounds present. No organomegaly or mass.  EXTREMITIES: 1+ bilateral lower extremity pitting edema with no cyanosis, or clubbing.  NEUROLOGIC: Cranial nerves II through XII are intact. Muscle strength 5/5 in all extremities. Sensation intact. Gait not checked.  PSYCHIATRIC: The patient is alert and oriented x 3.  Normal affect and good eye contact. SKIN: No obvious rash, lesion, or ulcer.   LABORATORY PANEL:   CBC Recent Labs  Lab 12/13/20 2353  WBC 6.3  HGB 12.6*  HCT 37.4*  PLT 281   ------------------------------------------------------------------------------------------------------------------  Chemistries  Recent Labs  Lab 12/13/20 2353  NA 128*  K 4.7  CL 93*  CO2 26  GLUCOSE 111*  BUN 30*  CREATININE 1.53*  CALCIUM 8.7*   ------------------------------------------------------------------------------------------------------------------  Cardiac Enzymes No results for input(s): TROPONINI in the last 168 hours. ------------------------------------------------------------------------------------------------------------------  RADIOLOGY:  DG Chest 2 View  Result Date: 12/14/2020 CLINICAL DATA:  Dyspnea, chest pain EXAM: CHEST - 2 VIEW COMPARISON:  12/04/2017  FINDINGS: The lungs are symmetrically well inflated. There has developed a a subtle reticulonodular infiltrate diffusely within the lungs, possibly related to changes of atypical infection in the appropriate clinical setting. No confluent pulmonary infiltrate. No pneumothorax or pleural effusion. Mild cardiomegaly is stable. No acute bone abnormality. IMPRESSION: Diffuse reticulonodular infiltrate, possibly related to changes of atypical infection. No confluent pulmonary infiltrate identified. Electronically Signed   By: Fidela Salisbury MD   On: 12/14/2020 01:12      IMPRESSION AND PLAN:  Active Problems:   COPD exacerbation ([place])  1.  COPD acute exacerbation possibly secondary to associated acute bronchitis.  I doubt pneumonia in his case.  He has no fever or leukocytosis. - He will be admitted to a medical monitored bed. - We will continue bronchodilator therapy with DuoNebs 4 times daily and every 4 hours as needed. - Mucolytic therapy will be provided. - We will check sputum and blood cultures. -  We will continue IV steroid therapy with Solu-Medrol. - We will start him on IV Rocephin given severity of COPD exacerbation.  2.  Possible cor pulmonale with acute on chronic systolic CHF. - We will diurese him with IV Lasix. - We are obtaining BNP level. - His EF was 35% and 42% in the past.  3.  Dyslipidemia. - We will continue statin therapy.  4.  Essential hypertension. - We will place on as needed IV labetalol.  DVT prophylaxis: Lovenox.  Code Status: full code.  Family Communication:  The plan of care was discussed in details with the patient (who requested no other family members to be notified.). I answered all questions. The patient agreed to proceed with the above mentioned plan. Further management will depend upon hospital course. Disposition Plan: Back to previous home environment Consults called: none.  All the records are reviewed and case discussed with ED  provider.  Status is: Inpatient  Remains inpatient appropriate because:Ongoing diagnostic testing needed not appropriate for outpatient work up, Unsafe d/c plan, IV treatments appropriate due to intensity of illness or inability to take PO and Inpatient level of care appropriate due to severity of illness   Dispo: The patient is from: Home              Anticipated d/c is to: Home              Patient currently is not medically stable to d/c.   Difficult to place patient No      TOTAL TIME TAKING CARE OF THIS PATIENT: 55 minutes.    Christel Mormon M.D on 12/14/2020 at 3:34 AM  [facility]   From 7 PM-7 AM, contact night-coverage [email or web address]  CC: Primary care physician; Perrin Maltese, MD

## 2020-12-14 NOTE — ED Provider Notes (Signed)
[facility] Emergency Department Provider Note   ____________________________________________   Event Date/Time   First MD Initiated Contact with Patient 12/14/20 0036     (approximate)  I have reviewed the triage vital signs and the nursing notes.   HISTORY  Chief Complaint Cough and Sore Throat    HPI [name] is a 65 y.o. male who presents to the ED from home with a chief complaint of cough, sore throat, cough and shortness of breath.  Patient has a history of COPD and CHF who used to be on chronic oxygen but no longer requires it.  Reports he had bronchitis 2 weeks ago and recovered.  Reports a 1 to 2-day history of nonproductive cough, sore throat, fatigue, wheezing and shortness of breath.  Denies fever, chest pain, abdominal pain, nausea, vomiting or dizziness.  Patient is vaccinated and boosted against COVID-19.     Past Medical History:  Diagnosis Date  . Anxiety   . Arthritis    knees, lower back  . Bronchitis   . Cardiomyopathy ([place])   . CHF (congestive heart failure) ([place])   . Chronic back pain   . Complication of anesthesia    has trouble keeping pt asleep due to taking narcotics qid  . Coronary artery disease   . Diverticulitis   . Hepatitis C virus infection without hepatic coma    received treatment. clear now  . History of kidney stones   . Hypertension     Patient Active Problem List   Diagnosis Date Noted  . COPD exacerbation ([place]) 12/14/2020  . CAD (coronary artery disease) 07/05/2019  . SBO (small bowel obstruction) ([place]) 07/04/2019  . Staghorn calculus 05/28/2019  . Elevated rheumatoid factor 12/12/2018  . Prediabetes 09/20/2018  . Pain syndrome, chronic 02/24/2018  . Acute respiratory failure with hypoxia ([place]) 12/04/2017  . COPD (chronic obstructive pulmonary disease) ([place]) 12/04/2017  . Acute bronchitis 12/04/2017  . Atherosclerosis of native arteries of extremity with intermittent claudication ([place])  11/10/2017  . Leg pain 11/10/2017  . Hyperlipidemia 11/10/2017  . DJD (degenerative joint disease) 11/10/2017  . Essential hypertension 11/10/2017  . Lumbar spondylosis 08/17/2017  . Loss of weight   . Polyp of sigmoid colon   . Gastritis without bleeding   . 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 History of urinary stone 04/18/2015  . Kidney stone 04/16/2014  . Gross hematuria 04/16/2013  . Nephrolithiasis 04/16/2013  . Cardiomyopathy ([place]) 01/23/2013  . 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 Congestive heart failure ([place]) 05/31/2011  . Current smoker 05/31/2011  . Asthma 06/24/2008    Past Surgical History:  Procedure Laterality Date  . BACK SURGERY    . CARDIAC CATHETERIZATION    . CHOLECYSTECTOMY    . COLON RESECTION  2007   due to diverticulitis  . COLONOSCOPY WITH PROPOFOL N/A 03/29/2017   Procedure: COLONOSCOPY WITH PROPOFOL;  Surgeon: Lucilla Lame, MD;  Location: [facility] ENDOSCOPY;  Service: Endoscopy;  Laterality: N/A;  . CYSTOSCOPY/URETEROSCOPY/HOLMIUM LASER/STENT PLACEMENT Left 07/16/2019   Procedure: CYSTOSCOPY/URETEROSCOPY/HOLMIUM LASER/STENT Exchange;  Surgeon: Hollice Espy, MD;  Location: [facility] ORS;  Service: Urology;  Laterality: Left;  . ESOPHAGOGASTRODUODENOSCOPY (EGD) WITH PROPOFOL N/A 03/29/2017   Procedure: ESOPHAGOGASTRODUODENOSCOPY (EGD) WITH PROPOFOL;  Surgeon: Lucilla Lame, MD;  Location: [facility] ENDOSCOPY;  Service: Endoscopy;  Laterality: N/A;  . FINGER SURGERY  2007   cut finger off with saw and was reattached  . HERNIA REPAIR    . HOLMIUM LASER  APPLICATION N/A [id number]/[id number]   Procedure: HOLMIUM LASER APPLICATION;  Surgeon: Hollice Espy, MD;  Location: [facility] ORS;  Service: Urology;  Laterality: N/A;  . IR NEPHROSTOMY PLACEMENT LEFT  05/28/2019  . KIDNEY SURGERY  2021  . KNEE ARTHROSCOPY Bilateral   . LOWER EXTREMITY ANGIOGRAPHY Right 11/09/2017   Procedure: LOWER EXTREMITY ANGIOGRAPHY;  Surgeon: Katha Cabal, MD;  Location: [facility] CV LAB;  Service: Cardiovascular;  Laterality: Right;  . LOWER EXTREMITY ANGIOGRAPHY Right 11/21/2018   Procedure: LOWER EXTREMITY ANGIOGRAPHY;  Surgeon: Katha Cabal, MD;  Location: [place] CV LAB;  Service: Cardiovascular;  Laterality: Right;  . LOWER EXTREMITY ANGIOGRAPHY Right 09/18/2019   Procedure: LOWER EXTREMITY ANGIOGRAPHY;  Surgeon: Katha Cabal, MD;  Location: [place] CV LAB;  Service: Cardiovascular;  Laterality: Right;  . NEPHROLITHOTOMY Left 05/28/2019   Procedure: NEPHROLITHOTOMY PERCUTANEOUS;  Surgeon: Hollice Espy, MD;  Location: [facility] ORS;  Service: Urology;  Laterality: Left;  . SPINAL FUSION      Prior to Admission medications   Medication Sig Start Date End Date Taking? Authorizing Provider  albuterol (VENTOLIN HFA) 108 (90 Base) MCG/ACT inhaler Inhale 2 puffs into the lungs every 6 (six) hours as needed for wheezing or shortness of breath.   Yes [provider]  ALPRAZolam Duanne Moron) 1 MG tablet Take 1 mg by mouth 3 (three) times daily. 01/06/16  Yes [provider]  atorvastatin (LIPITOR) 80 MG tablet Take 1 tablet by mouth daily. 11/22/20  Yes [provider]  carvedilol (COREG) 25 MG tablet Take 25 mg by mouth 2 (two) times daily.    Yes [provider]  clopidogrel (PLAVIX) 75 MG tablet Take 1 tablet (75 mg total) by mouth daily. 11/09/17  Yes Schnier, Dolores Lory, MD  ENTRESTO 49-51 MG Take 1 tablet by mouth 2 (two) times daily. 10/14/20  Yes [provider]  FARXIGA 10 MG TABS tablet Take 10 mg by mouth daily. 10/21/20  Yes [provider]  fluticasone (FLONASE) 50 MCG/ACT nasal spray Place 1 spray into both nostrils daily as needed for allergies.  01/20/17  Yes [provider]  furosemide (LASIX) 20 MG tablet Take 20 mg by mouth daily.   Yes [provider]  ipratropium-albuterol (DUONEB) 0.5-2.5 (3) MG/3ML SOLN Take 3 mLs by nebulization every 4 (four)  hours as needed (for SOB). Patient taking differently: Take 3 mLs by nebulization every 4 (four) hours as needed (for shortness of breath). 12/05/17  Yes Vaughan Basta, MD  oxyCODONE (ROXICODONE) 15 MG immediate release tablet Take 15 mg by mouth 4 (four) times daily. 05/07/19  Yes [provider]    Allergies Codeine and Dilaudid [hydromorphone]  Family History  Problem Relation Age of Onset  . COPD Mother     Social History Social History   Tobacco Use  . Smoking status: Current Every Day Smoker    Packs/day: 0.50    Years: 25.00    Pack years: 12.50    Types: Cigarettes  . Smokeless tobacco: Never Used  Vaping Use  . Vaping Use: Never used  Substance Use Topics  . Alcohol use: No  . Drug use: No    Review of Systems  Constitutional: No fever/chills Eyes: No visual changes. ENT: No sore throat. Cardiovascular: Denies chest pain. Respiratory: Positive for cough, wheezing and shortness of breath. Gastrointestinal: No abdominal pain.  No nausea, no vomiting.  No diarrhea.  No constipation. Genitourinary: Negative for dysuria. Musculoskeletal: Negative for back pain. Skin: Negative for  rash. Neurological: Negative for headaches, focal weakness or numbness.   ____________________________________________   PHYSICAL EXAM:  VITAL SIGNS: ED Triage Vitals  Enc Vitals Group     BP 12/13/20 2347 113/89     Pulse Rate 12/13/20 2347 73     Resp 12/13/20 2347 18     Temp 12/13/20 2347 98 F (36.7 C)     Temp Source 12/13/20 2347 Oral     SpO2 12/13/20 2347 99 %     Weight 12/13/20 2349 145 lb 1 oz (65.8 kg)     Height 12/13/20 2349 5\' 7"  (1.702 m)     Head Circumference --      Peak Flow --      Pain Score 12/13/20 2349 5     Pain Loc --      Pain Edu? --      Excl. in [place]? --     Constitutional: Alert and oriented.  Uncomfortable appearing and in mild to moderate acute distress. Eyes: Conjunctivae are normal. PERRL. EOMI. Head:  Atraumatic. Nose: No congestion/rhinnorhea. Mouth/Throat: Mucous membranes are moist.   Neck: No stridor.   Cardiovascular: Normal rate, regular rhythm. Grossly normal heart sounds.  Good peripheral circulation. Respiratory: Normal respiratory effort.  No retractions. Lungs with diffuse wheezing.  Active vigorous cough noted. Gastrointestinal: Soft and nontender to light or deep palpation. No distention. No abdominal bruits. No CVA tenderness. Musculoskeletal: No lower extremity tenderness nor edema.  No joint effusions. Neurologic:  Normal speech and language. No gross focal neurologic deficits are appreciated.  Skin:  Skin is warm, dry and intact. No rash noted. Psychiatric: Mood and affect are normal. Speech and behavior are normal.  ____________________________________________   LABS (all labs ordered are listed, but only abnormal results are displayed)  Labs Reviewed  CBC WITH DIFFERENTIAL/PLATELET - Abnormal; Notable for the following components:      Result Value   Hemoglobin 12.6 (*)    HCT 37.4 (*)    MCV 78.4 (*)    RDW 15.8 (*)    All other components within normal limits  BASIC METABOLIC PANEL - Abnormal; Notable for the following components:   Sodium 128 (*)    Chloride 93 (*)    Glucose, Bld 111 (*)    BUN 30 (*)    Creatinine, Ser 1.53 (*)    Calcium 8.7 (*)    GFR, Estimated 51 (*)    All other components within normal limits  TROPONIN I (HIGH SENSITIVITY) - Abnormal; Notable for the following components:   Troponin I (High Sensitivity) 29 (*)    All other components within normal limits  RESP PANEL BY RT-PCR (FLU A&B, COVID) ARPGX2  EXPECTORATED SPUTUM ASSESSMENT W GRAM STAIN, RFLX TO RESP C  CULTURE, BLOOD (ROUTINE X 2)  CULTURE, BLOOD (ROUTINE X 2)  HIV ANTIBODY (ROUTINE TESTING W REFLEX)  BASIC METABOLIC PANEL  CBC  BRAIN NATRIURETIC PEPTIDE   ____________________________________________  EKG  ED ECG REPORT I, Arnetia Bronk J, the attending  physician, personally viewed and interpreted this ECG.   Date: 12/14/2020  EKG Time: 0043  Rate: 77  Rhythm: normal EKG, normal sinus rhythm  Axis: Normal  Intervals:nonspecific intraventricular conduction delay  ST&T Change: Nonspecific  ____________________________________________  RADIOLOGY I, Joelynn Dust J, personally viewed and evaluated these images (plain radiographs) as part of my medical decision making, as well as reviewing the written report by the radiologist.  ED MD interpretation: Diffuse reticulonodular infiltrate possibly related to changes of atypical infection  Official radiology  report(s): DG Chest 2 View  Result Date: 12/14/2020 CLINICAL DATA:  Dyspnea, chest pain EXAM: CHEST - 2 VIEW COMPARISON:  12/04/2017 FINDINGS: The lungs are symmetrically well inflated. There has developed a a subtle reticulonodular infiltrate diffusely within the lungs, possibly related to changes of atypical infection in the appropriate clinical setting. No confluent pulmonary infiltrate. No pneumothorax or pleural effusion. Mild cardiomegaly is stable. No acute bone abnormality. IMPRESSION: Diffuse reticulonodular infiltrate, possibly related to changes of atypical infection. No confluent pulmonary infiltrate identified. Electronically Signed   By: Fidela Salisbury MD   On: 12/14/2020 01:12    ____________________________________________   PROCEDURES  Procedure(s) performed (including Critical Care):  .1-3 Lead EKG Interpretation Performed by: Paulette Blanch, MD Authorized by: Paulette Blanch, MD     Interpretation: normal     ECG rate:  75   ECG rate assessment: normal     Rhythm: sinus rhythm     Ectopy: none     Conduction: normal   Comments:     Patient placed on cardiac monitor to evaluate for arrhythmias    CRITICAL CARE Performed by: Paulette Blanch   Total critical care time: 30 minutes  Critical care time was exclusive of separately billable procedures and treating other  patients.  Critical care was necessary to treat or prevent imminent or life-threatening deterioration.  Critical care was time spent personally by me on the following activities: development of treatment plan with patient and/or surrogate as well as nursing, discussions with consultants, evaluation of patient's response to treatment, examination of patient, obtaining history from patient or surrogate, ordering and performing treatments and interventions, ordering and review of laboratory studies, ordering and review of radiographic studies, pulse oximetry and re-evaluation of patient's condition.  ____________________________________________   INITIAL IMPRESSION / ASSESSMENT AND PLAN / ED COURSE  As part of my medical decision making, I reviewed the following data within the [place] notes reviewed and incorporated, Labs reviewed, EKG interpreted, Old chart reviewed, Radiograph reviewed and Notes from prior ED visits     65 year old male presenting with cough, wheezing and shortness of breath. Differential includes, but is not limited to, viral syndrome, bronchitis including COPD exacerbation, pneumonia, reactive airway disease including asthma, CHF including exacerbation with or without pulmonary/interstitial edema, pneumothorax, ACS, thoracic trauma, and pulmonary embolism.  Laboratory results demonstrate mild hyponatremia, improved renal function from prior.  Will obtain troponin, COVID swab, chest x-ray.  Initiate treatment with IV Solu-Medrol, DuoNeb and Tussionex.  Will reassess.  Clinical Course as of 12/14/20 4098  Nancy Fetter Dec 14, 2020  0232 Room air saturation 89%; will place on 2 L nasal cannula.  Remains diminished with wheezing, will administer second DuoNeb.  Will discuss with hospital services for admission. [JS]    Clinical Course User Index [JS] Paulette Blanch, MD     ____________________________________________   FINAL CLINICAL IMPRESSION(S) / ED  DIAGNOSES  Final diagnoses:  Chronic obstructive pulmonary disease with acute exacerbation ([place])  Hyponatremia  Hypoxia     ED Discharge Orders    None       Note:  This document was prepared using Dragon voice recognition software and may include unintentional dictation errors.   Paulette Blanch, MD 12/14/20 [phone]

## 2020-12-14 NOTE — ED Notes (Signed)
Lab to add on Troponin at this time.

## 2020-12-15 ENCOUNTER — Inpatient Hospital Stay (HOSPITAL_COMMUNITY)
Admit: 2020-12-15 | Discharge: 2020-12-15 | Disposition: A | Discharge status: Home / Self Care | Payer: Medicare Other | Attending: Family Medicine | Admitting: Family Medicine

## 2020-12-15 DIAGNOSIS — I5021 Acute systolic (congestive) heart failure: Secondary | ICD-10-CM

## 2020-12-15 DIAGNOSIS — Z720 Tobacco use: Secondary | ICD-10-CM

## 2020-12-15 DIAGNOSIS — I5043 Acute on chronic combined systolic (congestive) and diastolic (congestive) heart failure: Secondary | ICD-10-CM

## 2020-12-15 LAB — BASIC METABOLIC PANEL
Anion gap: 11 (ref 5–15)
BUN: 33 mg/dL — ABNORMAL HIGH (ref 8–23)
CO2: 31 mmol/L (ref 22–32)
Calcium: 8.9 mg/dL (ref 8.9–10.3)
Chloride: 91 mmol/L — ABNORMAL LOW (ref 98–111)
Creatinine, Ser: 1.58 mg/dL — ABNORMAL HIGH (ref 0.61–1.24)
GFR, Estimated: 49 mL/min — ABNORMAL LOW (ref >60)
Glucose, Bld: 152 mg/dL — ABNORMAL HIGH (ref 70–99)
Potassium: 4.7 mmol/L (ref 3.5–5.1)
Sodium: 133 mmol/L — ABNORMAL LOW (ref 135–145)

## 2020-12-15 LAB — ECHOCARDIOGRAM COMPLETE
AR max vel: 1.22 cm2
AV Area VTI: 1.2 cm2
AV Area mean vel: 1.2 cm2
AV Mean grad: 3 mmHg
AV Peak grad: 5.7 mmHg
Ao pk vel: 1.19 m/s
Area-P 1/2: 6.77 cm2
Calc EF: 4.4 %
Height: 67 in
S' Lateral: 5.7 cm
Single Plane A2C EF: 9.4 %
Single Plane A4C EF: 0.7 %
Weight: 2726.65 oz

## 2020-12-15 MED ORDER — FUROSEMIDE 80 MG PO TABS: 80.0000 mg | ORAL_TABLET | Freq: Two times a day (BID) | ORAL | 11 refills | Status: DC

## 2020-12-15 MED ORDER — POTASSIUM CHLORIDE CRYS ER 20 MEQ PO TBCR: 20.0000 meq | EXTENDED_RELEASE_TABLET | Freq: Every day | ORAL | 1 refills | Status: DC

## 2020-12-15 MED ORDER — PREDNISONE 20 MG PO TABS: ORAL_TABLET | ORAL | 0 refills | Status: AC

## 2020-12-15 NOTE — Progress Notes (Signed)
Pt being discharged home, discharge instructions reviewed with pt, states understanding, pt with no complaints, CHF info reviewed with pt and CHF RN at bedside with pt currently, awaiting o2 delivery for discharge

## 2020-12-15 NOTE — Discharge Summary (Signed)
Discharge Summary  [name] MVH:[id number] DOB: Apr 05, 1956  PCP: Perrin Maltese, MD  Admit date: 12/14/2020 Discharge date: 12/15/2020  Time spent: 45 minutes  Recommendations for Outpatient Follow-up:  1. New medication: K-Dur 40 mill equivalents p.o. daily 2. Medication change: Lasix increased from 20 mg daily to 80 mg twice daily 3. Patient is being referred to the advanced heart failure clinic in [place] with Dr. Pierre Bali.  They will try to get him in tomorrow 4. Patient being discharged with home oxygen, 2 L nasal cannula 5. Patient being discharged home health nurse  Discharge Diagnoses:  Active Hospital Problems   Diagnosis Date Noted  . COPD exacerbation ([place]) 12/14/2020  . CAD (coronary artery disease) 07/05/2019  . Acute respiratory failure with hypoxia ([place]) 12/04/2017  . Essential hypertension 11/10/2017    Resolved Hospital Problems  No resolved problems to display.    Discharge Condition: Improved, being discharged home  Diet recommendation: Heart healthy  Vitals:   12/15/20 0839 12/15/20 1316  BP: 134/82 (!) 131/96  Pulse: 76 92  Resp: 17 16  Temp: (!) 97.5 F (36.4 C) 98.1 F (36.7 C)  SpO2: 97% 92%    History of present illness:  65 year old male with past medical history of hypertension, CAD, COPD who presented to the emergency room on the night of 6/4 with complaints of cough and shortness of breath times several days and admitted to the hospital service for COPD exacerbation and suspected acute systolic CHF.  Patient tachypneic and put on 2 L nasal cannula, not reportedly hypoxic.  BNP came back elevated at 4300.  Patient started on nebulizers, steroids and IV Lasix.    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  His BNP was elevated at 4300 on  admission.  Echocardiogram ordered noting grade 3 diastolic dysfunction, ejection fraction of less than 20% with global hypokinesis and in discussion with cardiology, likely between 10 and 15%.  In discussion with the patient, he told me that at 1 point, his ejection fraction was around 12% but after some cardiology intervention if it improved to about 25%.  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  He was very insistent on having to leave today to get his wife to her appointments.  We had an extensive discussion and he was unaware of the severity of his heart disease.  Did not know that he should be weighing himself every day.  He also did not know that ideally, he should have a defibrillator for arrhythmia and he likely needed to be on a much higher dose of Lasix.  I explained to him that with his current ejection fraction, that that he could pass away suddenly in the next 6 to 12 months.  Patient was visibly shaken by this.  While he still insisted on having to leave today, he was amenable to changes.  He requested to change cardiologists and wanted a heart failure specialist.  Have spoken with the advanced heart failure clinic and Dr. Pierre Bali will be seeing him tomorrow, 6/7.  Him setting up with home health nurse and he will increase his Lasix from 20 mg daily to 80 mg twice daily given that he is still acutely in failure despite having already diuresed over 6 L.  On room air at rest,  patient's oxygen saturations were 87% and improved to 93% on 2 L nasal cannula.  Already on Entresto and Coreg.   CAD (coronary artery disease)   COPD exacerbation ([place]): Patient placed on nebulizers and steroids.  Received a steroid taper.  No evidence of acute infection.  Tobacco abuse: Patient had quit and then recently restarted.  I urged him to quit smoking soon as possible.  He says he will.  Hyponatremia: Likely in the setting of hypervolemic hyponatremia from  CHF.  Procedures:  Echocardiogram noted grade 3 diastolic dysfunction plus decreased ejection fraction of less than 20% with global hypokinesis  Consultations:  Case discussed with cardiology  Discharge Exam: BP (!) 131/96 (BP Location: Left Arm)   Pulse 92   Temp 98.1 F (36.7 C)   Resp 16   Ht 5\' 7"  (1.702 m)   Wt 77.3 kg   SpO2 92%   BMI 26.69 kg/m   General: Alert and oriented x3, tearful after we discussed his prognosis Cardiovascular: Regular rate and rhythm, S1-S2 Respiratory: Decreased breath sounds throughout, improved with less end expiratory wheezing  Discharge Instructions You were cared for by a hospitalist during your hospital stay. If you have any questions about your discharge medications or the care you received while you were in the hospital after you are discharged, you can call the unit and asked to speak with the hospitalist on call if the hospitalist that took care of you is not available. Once you are discharged, your primary care physician will handle any further medical issues. Please note that NO REFILLS for any discharge medications will be authorized once you are discharged, as it is imperative that you return to your primary care physician (or establish a relationship with a primary care physician if you do not have one) for your aftercare needs so that they can reassess your need for medications and monitor your lab values.  Discharge Instructions    AMB referral to CHF clinic   Complete by: As directed    Diet - low sodium heart healthy   Complete by: As directed    Increase activity slowly   Complete by: As directed      Allergies as of 12/15/2020      Reactions   Codeine Itching   Dilaudid [hydromorphone] Other (See Comments)   Severe headache      Medication List    TAKE these medications   albuterol 108 (90 Base) MCG/ACT inhaler Commonly known as: VENTOLIN HFA Inhale 2 puffs into the lungs every 6 (six) hours as needed for wheezing or  shortness of breath.   ALPRAZolam 1 MG tablet Commonly known as: XANAX Take 1 mg by mouth 3 (three) times daily.   atorvastatin 80 MG tablet Commonly known as: LIPITOR Take 1 tablet by mouth daily.   carvedilol 25 MG tablet Commonly known as: COREG Take 25 mg by mouth 2 (two) times daily.   clopidogrel 75 MG tablet Commonly known as: Plavix Take 1 tablet (75 mg total) by mouth daily.   Entresto 49-51 MG Generic drug: sacubitril-valsartan Take 1 tablet by mouth 2 (two) times daily.   Farxiga 10 MG Tabs tablet Generic drug: dapagliflozin propanediol Take 10 mg by mouth daily.   fluticasone 50 MCG/ACT nasal spray Commonly known as: FLONASE Place 1 spray into both nostrils daily as needed for allergies.   furosemide 80 MG tablet Commonly known as: Lasix Take 1 tablet (80 mg total) by mouth 2 (two) times daily. What changed:  medication strength  how much to take  when to take this   ipratropium-albuterol 0.5-2.5 (3) MG/3ML Soln Commonly known as: DUONEB Take 3 mLs by nebulization every 4 (four) hours as needed (for SOB). What changed: reasons to take this   oxyCODONE 15 MG immediate release tablet Commonly known as: ROXICODONE Take 15 mg by mouth 4 (four) times daily.   potassium chloride SA 20 MEQ tablet Commonly known as: KLOR-CON Take 1 tablet (20 mEq total) by mouth daily.   predniSONE 20 MG tablet Commonly known as: DELTASONE Take 2 tablets (40 mg total) by mouth daily with breakfast for 2 days, THEN 1.5 tablets (30 mg total) daily with breakfast for 2 days, THEN 1 tablet (20 mg total) daily with breakfast for 2 days, THEN 0.5 tablets (10 mg total) daily with breakfast for 2 days. Start taking on: December 16, 2020            Durable Medical Equipment  (From admission, onward)         Start     Ordered   12/15/20 1318  DME Oxygen  Once       Question Answer Comment  Length of Need 6 Months   Mode or (Route) Nasal cannula   Liters per Minute 2    Frequency Continuous (stationary and portable oxygen unit needed)   Oxygen delivery system Gas      12/15/20 1322         Allergies  Allergen Reactions  . Codeine Itching  . Dilaudid [Hydromorphone] Other (See Comments)    Severe headache    Follow-up Information    Bensimhon, Shaune Pascal, MD Follow up.   Specialty: Cardiology Why: Dr. Sung Amabile is a heart failure specialist.  His office will call you to set up appointment for this week.  (It is in [place]) Sport and exercise psychologist information: [address] [place] [address] [phone]                The results of significant diagnostics from this hospitalization (including imaging, microbiology, ancillary and laboratory) are listed below for reference.    Significant Diagnostic Studies: DG Chest 2 View  Result Date: 12/14/2020 CLINICAL DATA:  Dyspnea, chest pain EXAM: CHEST - 2 VIEW COMPARISON:  12/04/2017 FINDINGS: The lungs are symmetrically well inflated. There has developed a a subtle reticulonodular infiltrate diffusely within the lungs, possibly related to changes of atypical infection in the appropriate clinical setting. No confluent pulmonary infiltrate. No pneumothorax or pleural effusion. Mild cardiomegaly is stable. No acute bone abnormality. IMPRESSION: Diffuse reticulonodular infiltrate, possibly related to changes of atypical infection. No confluent pulmonary infiltrate identified. Electronically Signed   By: Fidela Salisbury MD   On: 12/14/2020 01:12   US Venous Img Lower Unilateral Left (DVT)  Result Date: 12/01/2020 CLINICAL DATA:  Swelling X 2 weeks EXAM: LEFT LOWER EXTREMITY VENOUS DOPPLER ULTRASOUND TECHNIQUE: Gray-scale sonography with compression, as well as color and duplex ultrasound, were performed to evaluate the deep venous system(s) from the level of the common femoral vein through the popliteal and proximal calf veins. COMPARISON:  09/25/2019 contralateral FINDINGS: VENOUS Normal  compressibility of the common femoral, superficial femoral, and popliteal veins, as well as the visualized calf veins. Visualized portions of profunda femoral vein and great saphenous vein unremarkable. No filling defects to suggest DVT on grayscale or color Doppler imaging. Doppler waveforms show normal direction of venous flow, normal respiratory phasicity and response to augmentation. Limited views of the contralateral common femoral vein  are unremarkable. OTHER None. Limitations: none IMPRESSION: No femoropopliteal DVT nor evidence of DVT within the visualized calf veins. If clinical symptoms are inconsistent or if there are persistent or worsening symptoms, further imaging (possibly involving the iliac veins) may be warranted. Electronically Signed   By: Lucrezia Europe M.D.   On: 12/01/2020 15:49   ECHOCARDIOGRAM COMPLETE  Result Date: 12/15/2020    ECHOCARDIOGRAM REPORT   Patient Name:   [name] Date of Exam: 12/15/2020 Medical Rec #:  [id number]      Height:       67.0 in Accession #:    [id number]     Weight:       170.4 lb Date of Birth:  [birth year]      BSA:          1.889 m Patient Age:    65 years       BP:           116/71 mmHg Patient Gender: M              HR:           80 bpm. Exam Location:  [facility] Procedure: 2D Echo, Cardiac Doppler and Color Doppler Indications:     CHF-acute systolic V76.16  History:         Patient has no prior history of Echocardiogram examinations.                  CHF and Cardiomyopathy; Risk Factors:Hypertension. Bronchitis.  Sonographer:     Sherrie Sport RDCS (AE) Referring Phys:  [id number] Arvella Merles MANSY Diagnosing Phys: Kathlyn Sacramento MD IMPRESSIONS  1. Left ventricular ejection fraction, by estimation, is <20%. The left ventricle has severely decreased function. The left ventricle demonstrates global hypokinesis. The left ventricular internal cavity size was moderately to severely dilated. Left ventricular diastolic parameters are consistent with Grade III diastolic  dysfunction (restrictive).  2. Right ventricular systolic function is normal. The right ventricular size is normal. Tricuspid regurgitation signal is inadequate for assessing PA pressure.  3. Left atrial size was moderately dilated.  4. The mitral valve is normal in structure. Mild mitral valve regurgitation. No evidence of mitral stenosis.  5. The aortic valve is normal in structure. Aortic valve regurgitation is not visualized. No aortic stenosis is present. FINDINGS  Left Ventricle: Left ventricular ejection fraction, by estimation, is <20%. The left ventricle has severely decreased function. The left ventricle demonstrates global hypokinesis. The left ventricular internal cavity size was moderately to severely dilated. There is no left ventricular hypertrophy. Left ventricular diastolic parameters are consistent with Grade III diastolic dysfunction (restrictive). Right Ventricle: The right ventricular size is normal. No increase in right ventricular wall thickness. Right ventricular systolic function is normal. Tricuspid regurgitation signal is inadequate for assessing PA pressure. Left Atrium: Left atrial size was moderately dilated. Right Atrium: Right atrial size was normal in size. Pericardium: There is no evidence of pericardial effusion. Mitral Valve: The mitral valve is normal in structure. Mild mitral valve regurgitation. No evidence of mitral valve stenosis. Tricuspid Valve: The tricuspid valve is normal in structure. Tricuspid valve regurgitation is trivial. No evidence of tricuspid stenosis. Aortic Valve: The aortic valve is normal in structure. Aortic valve regurgitation is not visualized. No aortic stenosis is present. Aortic valve mean gradient measures 3.0 mmHg. Aortic valve peak gradient measures 5.7 mmHg. Aortic valve area, by VTI measures 1.20 cm. Pulmonic Valve: The pulmonic valve was normal in structure. Pulmonic valve regurgitation is not  visualized. No evidence of pulmonic stenosis. Aorta:  The aortic root is normal in size and structure. Venous: The inferior vena cava was not well visualized. IAS/Shunts: No atrial level shunt detected by color flow Doppler.  LEFT VENTRICLE PLAX 2D LVIDd:         6.14 cm LVIDs:         5.70 cm LV PW:         1.63 cm LV IVS:        0.71 cm LVOT diam:     2.00 cm LV SV:         26 LV SV Index:   14 LVOT Area:     3.14 cm  LV Volumes (MOD) LV vol d, MOD A2C: 159.0 ml LV vol d, MOD A4C: 145.0 ml LV vol s, MOD A2C: 144.0 ml LV vol s, MOD A4C: 144.0 ml LV SV MOD A2C:     15.0 ml LV SV MOD A4C:     145.0 ml LV SV MOD BP:      6.5 ml RIGHT VENTRICLE RV Basal diam:  4.21 cm RV S prime:     12.30 cm/s TAPSE (M-mode): 4.7 cm LEFT ATRIUM             Index       RIGHT ATRIUM           Index LA diam:        4.60 cm 2.43 cm/m  RA Area:     18.60 cm LA Vol (A2C):   91.0 ml 48.17 ml/m RA Volume:   57.70 ml  30.54 ml/m LA Vol (A4C):   41.5 ml 21.97 ml/m LA Biplane Vol: 66.6 ml 35.25 ml/m  AORTIC VALVE AV Area (Vmax):    1.22 cm AV Area (Vmean):   1.20 cm AV Area (VTI):     1.20 cm AV Vmax:           119.33 cm/s AV Vmean:          79.867 cm/s AV VTI:            0.219 m AV Peak Grad:      5.7 mmHg AV Mean Grad:      3.0 mmHg LVOT Vmax:         46.30 cm/s LVOT Vmean:        30.600 cm/s LVOT VTI:          0.084 m LVOT/AV VTI ratio: 0.38  AORTA Ao Root diam: 2.60 cm MITRAL VALVE                TRICUSPID VALVE MV Area (PHT): 6.77 cm     TR Peak grad:   16.8 mmHg MV Decel Time: 112 msec     TR Vmax:        205.00 cm/s MV E velocity: 104.00 cm/s MV A velocity: 49.30 cm/s   SHUNTS MV E/A ratio:  2.11         Systemic VTI:  0.08 m                             Systemic Diam: 2.00 cm Kathlyn Sacramento MD Electronically signed by Kathlyn Sacramento MD Signature Date/Time: 12/15/2020/10:50:24 AM    Final     Microbiology: Recent Results (from the past 240 hour(s))  Resp Panel by RT-PCR (Flu A&B, Covid) Nasopharyngeal Swab     Status: None   Collection Time: 12/14/20 12:56 AM  Specimen:  Nasopharyngeal Swab; Nasopharyngeal(NP) swabs in vial transport medium  Result Value Ref Range Status   SARS Coronavirus 2 by RT PCR NEGATIVE NEGATIVE Final    Comment: (NOTE) SARS-CoV-2 target nucleic acids are NOT DETECTED.  The SARS-CoV-2 RNA is generally detectable in upper respiratory specimens during the acute phase of infection. The lowest concentration of SARS-CoV-2 viral copies this assay can detect is 138 copies/mL. A negative result does not preclude SARS-Cov-2 infection and should not be used as the sole basis for treatment or other patient management decisions. A negative result may occur with  improper specimen collection/handling, submission of specimen other than nasopharyngeal swab, presence of viral mutation(s) within the areas targeted by this assay, and inadequate number of viral copies(<138 copies/mL). A negative result must be combined with clinical observations, patient history, and epidemiological information. The expected result is Negative.  Fact Sheet for Patients:  EntrepreneurPulse.com.au  Fact Sheet for Healthcare Providers:  IncredibleEmployment.be  This test is no t yet approved or cleared by the [place] FDA and  has been authorized for detection and/or diagnosis of SARS-CoV-2 by FDA under an Emergency Use Authorization (EUA). This EUA will remain  in effect (meaning this test can be used) for the duration of the COVID-19 declaration under Section 564(b)(1) of the Act, 21 U.S.C.section 360bbb-3(b)(1), unless the authorization is terminated  or revoked sooner.       Influenza A by PCR NEGATIVE NEGATIVE Final   Influenza B by PCR NEGATIVE NEGATIVE Final    Comment: (NOTE) The Xpert Xpress SARS-CoV-2/FLU/RSV plus assay is intended as an aid in the diagnosis of influenza from Nasopharyngeal swab specimens and should not be used as a sole basis for treatment. Nasal washings and aspirates are unacceptable for  Xpert Xpress SARS-CoV-2/FLU/RSV testing.  Fact Sheet for Patients: EntrepreneurPulse.com.au  Fact Sheet for Healthcare Providers: IncredibleEmployment.be  This test is not yet approved or cleared by the [place] FDA and has been authorized for detection and/or diagnosis of SARS-CoV-2 by FDA under an Emergency Use Authorization (EUA). This EUA will remain in effect (meaning this test can be used) for the duration of the COVID-19 declaration under Section 564(b)(1) of the Act, 21 U.S.C. section 360bbb-3(b)(1), unless the authorization is terminated or revoked.  Performed at [facility], [address] [place], [place] [address]   Culture, blood (Routine X 2) w Reflex to ID Panel     Status: None (Preliminary result)   Collection Time: 12/14/20  6:14 AM   Specimen: BLOOD  Result Value Ref Range Status   Specimen Description BLOOD RIGHT ANTECUBITAL  Final   Special Requests   Final    BOTTLES DRAWN AEROBIC AND ANAEROBIC Blood Culture results may not be optimal due to an inadequate volume of blood received in culture bottles   Culture   Final    NO GROWTH 1 DAY Performed at [facility], [address], [place], [place] [address]    Report Status PENDING  Incomplete  Culture, blood (Routine X 2) w Reflex to ID Panel     Status: None (Preliminary result)   Collection Time: 12/14/20  6:18 AM   Specimen: BLOOD  Result Value Ref Range Status   Specimen Description BLOOD RIGHT HAND  Final   Special Requests   Final    BOTTLES DRAWN AEROBIC AND ANAEROBIC Blood Culture results may not be optimal due to an inadequate volume of blood received in culture bottles   Culture   Final  NO GROWTH 1 DAY Performed at [facility], [address] [place], [place] [address]    Report Status PENDING  Incomplete  Expectorated Sputum Assessment w Gram Stain, Rflx to Resp Cult     Status: None   Collection Time: 12/14/20  3:58  PM   Specimen: Expectorated Sputum  Result Value Ref Range Status   Specimen Description EXPECTORATED SPUTUM  Final   Special Requests NONE  Final   Sputum evaluation   Final    THIS SPECIMEN IS ACCEPTABLE FOR SPUTUM CULTURE Performed at [facility], [address] [place], [place] [address]    Report Status 12/14/2020 FINAL  Final  Culture, Respiratory w Gram Stain     Status: None (Preliminary result)   Collection Time: 12/14/20  3:58 PM  Result Value Ref Range Status   Specimen Description   Final    EXPECTORATED SPUTUM Performed at [facility], [address] [place], [place] [address]    Special Requests   Final    NONE Reflexed from [id number] Performed at [facility], [address] [place], [place] [address]    Gram Stain   Final    FEW WBC PRESENT, PREDOMINANTLY PMN FEW GRAM NEGATIVE RODS FEW GRAM POSITIVE RODS FEW GRAM POSITIVE COCCI IN CLUSTERS    Culture   Final    CULTURE REINCUBATED FOR BETTER GROWTH Performed at [facility], [place] [address], [place], [place] [address]    Report Status PENDING  Incomplete     Labs: Basic Metabolic Panel: Recent Labs  Lab 12/13/20 2353 12/14/20 0613 12/15/20 0629  NA 128* 129* 133*  K 4.7 4.5 4.7  CL 93* 92* 91*  CO2 26 24 31   GLUCOSE 111* 136* 152*  BUN 30* 28* 33*  CREATININE 1.53* 1.57* 1.58*  CALCIUM 8.7* 9.0 8.9   Liver Function Tests: No results for input(s): AST, ALT, ALKPHOS, BILITOT, PROT, ALBUMIN in the last 168 hours. No results for input(s): LIPASE, AMYLASE in the last 168 hours. No results for input(s): AMMONIA in the last 168 hours. CBC: Recent Labs  Lab 12/13/20 2353 12/14/20 0613  WBC 6.3 8.6  NEUTROABS 4.7  --   HGB 12.6* 13.5  HCT 37.4* 41.0  MCV 78.4* 78.8*  PLT 281 286   Cardiac Enzymes: No results for input(s): CKTOTAL, CKMB, CKMBINDEX, TROPONINI in the last 168 hours. BNP: BNP (last 3 results) Recent Labs    12/14/20 0613  BNP 4,355.4*     ProBNP (last 3 results) No results for input(s): PROBNP in the last 8760 hours.  CBG: No results for input(s): GLUCAP in the last 168 hours.     Signed:  Annita Brod, MD [facility] 12/15/2020, 5:19 PM

## 2020-12-15 NOTE — Consult Note (Addendum)
   Heart Failure Nurse Navigator Note  HFrEF<20%, Previously reported at 35-42%. In 2003 by catheterization noted to be 12%.  He presented to the ED with complaints of dyspnea on exertion, lower extremity edema and orthopnea.   Comorbidities:  Coronary artery disease Hypertension  Continued tobacco abuse  Medications:  Atorvastatin 80 mg daily Coreg 25 mg 2 times a day Plavix 75 mg daily Furosemide 40 mg IV every 12 hours  Home medications included Entresto 49/51 mg 2 times a day, Farxiga 10 mg daily, Aldactone 12-1/2 mg daily.  Labs: Sodium 133, potassium 4.7, chloride 91, CO2 31, BUN 33, creatinine 1.58, Weight 77.3 kg Intake 100 ml Output 4300 mL   Assessment:  General-he is awake and alert enough at the bedside, ready to be discharged home.  HEENT-pupils are equal, no JVD.  Cardiac-heart tones with regular rate and rhythm.  Chest- breath sounds are diminished in the bases.  Abdomen-soft nontender Musculoskeletal no lower extremity edema noted.  Psych-is pleasant and appropriate, makes good eye contact.  Neurologic-speech is clear, moves all extremities without difficulty.    Initial meeting with patient.  He states he weighs himself daily, at home had noticed a gradual 8 pound weight gain, more shortness of breath with exertion and also complained of early satiety.  Discussed with patient the zone magnet, and what the different zone meaning when to report to physician.  He voices understanding.  He does not use salt at the table but states wife does cook with it.  Asked if she could eliminate cooking with salt.  Again stressed the importance of weighing daily and reporting 2 pound weight gain overnight or 5 pounds within a week.  Also to report any changes in his symptoms.  He states when he is not carrying any extra fluid that he really is pretty active without symptoms.  Also discussed his tobacco abuse, he has gone two days without smoking,  Talked  about going home and disposing of the cigarettes.  He is in agreement, stating he has quit before and kicking himself for restarting.  Also discussed his medications.  He states that he has felt a lot better since taking Entresto in hopes that they do not get away from him.  Explained that that is one of the better heart medications that he could be on.  Also discussed his follow-up appointments with the heart failure clinic.  He was given instructions about the clinic.  Along with appointment time.  Pricilla Riffle RN [facility]

## 2020-12-15 NOTE — TOC Initial Note (Signed)
Transition of Care [facility]) - Initial/Assessment Note    Patient Details  Name: [name] MRN: [id number] Date of Birth: October 09, 1955  Transition of Care [facility]) CM/SW Contact:    Shelbie Hutching, RN Phone Number: 12/15/2020, 2:55 PM  Clinical Narrative:                 RNCM met with patient at the bedside.  Patient admitted for COPD and CHF exacerbation.  Patient will discharge home today.  Patient qualifies for home oxygen, oxygen ordered from [employer] and delivered to the patient's room.  Patient's father is picking him up today.  MD ordered home health RN and made a referral for heart failure clinic. Patient is from home with his wife and 2 great grandchildren.  Patient drives and is the only one in the family that drives and can provide their transportation.   Patient reports that he has a scale at home and weighs himself regularly to check for weight gain.  Patient is current with a PCP and cardiology over at [place] referral given to [place] with [place].  [facility] unable to accept referral.    Expected Discharge Plan: Home/Self Care Barriers to Discharge: No Barriers Identified   Patient Goals and CMS Choice Patient states their goals for this hospitalization and ongoing recovery are:: Patient is ready to go home- wants to be discharged today CMS Medicare.gov Compare Post Acute Care list provided to:: Patient Choice offered to / list presented to : Patient  Expected Discharge Plan and Services Expected Discharge Plan: Home/Self Care   Discharge Planning Services: CM Consult Post Acute Care Choice: Durable Medical Equipment Living arrangements for the past 2 months: Single Family Home Expected Discharge Date: 12/15/20               DME Arranged: Oxygen DME Agency: [facility] Date DME Agency Contacted: 12/15/20 Time DME Agency Contacted: [id number] Representative spoke with at DME Agency: Thedore Mins HH Arranged: RN [place] Agency: [place] Date  [place]: 12/15/20 Time [place]: 44 Representative spoke with at [place]: [place]  Prior Living Arrangements/Services Living arrangements for the past 2 months: [place] Lives with::  (great grandchildren) Patient language and need for interpreter reviewed:: Yes Do you feel safe going back to the place where you live?: Yes      Need for Family Participation in Patient Care: Yes (Comment) (COPD) Care giver support system in place?: Yes (comment) (wife)   Criminal Activity/Legal Involvement Pertinent to Current Situation/Hospitalization: No - Comment as needed  Activities of Daily Living Home Assistive Devices/Equipment: Nebulizer ADL Screening (condition at time of admission) Patient's cognitive ability adequate to safely complete daily activities?: Yes Is the patient deaf or have difficulty hearing?: No Does the patient have difficulty seeing, even when wearing glasses/contacts?: No Does the patient have difficulty concentrating, remembering, or making decisions?: No Patient able to express need for assistance with ADLs?: Yes Does the patient have difficulty dressing or bathing?: No Independently performs ADLs?: Yes (appropriate for developmental age) Does the patient have difficulty walking or climbing stairs?: Yes Weakness of Legs: None Weakness of Arms/Hands: None  Permission Sought/Granted Permission sought to share information with : Case Manager,Other (comment) Permission granted to share information with : Yes, Verbal Permission Granted     Permission granted to share info w AGENCY: [employer]        Emotional Assessment Appearance:: Appears stated age Attitude/Demeanor/Rapport: Engaged Affect (typically observed): Accepting Orientation: :  Oriented to Self,Oriented to Place,Oriented to  Time,Oriented to Situation Alcohol / Substance Use: Not Applicable Psych Involvement: No (comment)  Admission diagnosis:  Hyponatremia  [E87.1] Hypoxia [R09.02] COPD exacerbation (HCC) [J44.1] Chronic obstructive pulmonary disease with acute exacerbation ([place]) [J44.1] Patient Active Problem List   Diagnosis Date Noted  . COPD exacerbation ([place]) 12/14/2020  . CAD (coronary artery disease) 07/05/2019  . SBO (small bowel obstruction) ([place]) 07/04/2019  . Staghorn calculus 05/28/2019  . Elevated rheumatoid factor 12/12/2018  . Prediabetes 09/20/2018  . Pain syndrome, chronic 02/24/2018  . Acute respiratory failure with hypoxia ([place]) 12/04/2017  . COPD (chronic obstructive pulmonary disease) ([place]) 12/04/2017  . Acute bronchitis 12/04/2017  . Atherosclerosis of native arteries of extremity with intermittent claudication ([place]) 11/10/2017  . Leg pain 11/10/2017  . Hyperlipidemia 11/10/2017  . DJD (degenerative joint disease) 11/10/2017  . Essential hypertension 11/10/2017  . Lumbar spondylosis 08/17/2017  . Loss of weight   . Polyp of sigmoid colon   . Gastritis without bleeding   . Acute subendocardial infarction, subsequent episode of care ([place]) 01/05/2016  . History of urinary stone 04/18/2015  . Kidney stone 04/16/2014  . Gross hematuria 04/16/2013  . Nephrolithiasis 04/16/2013  . Cardiomyopathy ([place]) 01/23/2013  . 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 Congestive heart failure ([place]) 05/31/2011  . Current smoker 05/31/2011  . Asthma 06/24/2008   PCP:  Perrin Maltese, MD Pharmacy:   [place], [place] [address] [place] [address] Phone: [phone] Fax: [phone]     Social Determinants of Health (SDOH) Interventions    Readmission Risk Interventions No flowsheet data found.

## 2020-12-15 NOTE — Progress Notes (Signed)
*  PRELIMINARY RESULTS* Echocardiogram 2D Echocardiogram has been performed.  [name] 12/15/2020, 10:06 AM

## 2020-12-15 NOTE — Progress Notes (Signed)
SATURATION QUALIFICATIONS: (This note is used to comply with regulatory documentation for home oxygen)  Patient Saturations on Room Air at Rest = 86%  Patient Saturations on Room Air while Ambulating = 82-84%  Patient Saturations on 2 Liters of oxygen while Ambulating = 90%  Please briefly explain why patient needs home oxygen:

## 2020-12-16 ENCOUNTER — Telehealth (HOSPITAL_COMMUNITY): Payer: Self-pay | Admitting: Internal Medicine

## 2020-12-16 ENCOUNTER — Encounter (HOSPITAL_COMMUNITY): Payer: Medicare Other | Admitting: Internal Medicine

## 2020-12-16 ENCOUNTER — Telehealth: Payer: Self-pay

## 2020-12-16 DIAGNOSIS — J441 Chronic obstructive pulmonary disease with (acute) exacerbation: Secondary | ICD-10-CM | POA: Diagnosis not present

## 2020-12-16 LAB — CULTURE, RESPIRATORY W GRAM STAIN: Culture: NORMAL

## 2020-12-16 NOTE — Telephone Encounter (Addendum)
Post hospital follow-up phone call  Called to patient's home and spoke with Mrs. [name], [name].  Questioned why patient did not show to his appointment with Dr. Haroldine Laws with the advanced heart failure clinic in [place].  Wife states that they were not aware of the appointment.  Called and spoke with scheduling at the advanced heart failure clinic, they state that  Dr. Clayborne Dana nurse is not available, explained the situation that the patient did not know about the appointment.  They state that Nira Conn is back on Thursday and will call to reschedule that appointment.  She requests that the phone number called is the home phone which is [phone].  Discussed continuing to weigh himself daily and take his medicines as ordered.  Wife states that Dr. Lyman Speller was to call her also but she not heard anything.  Secure chat message sent.  She states that her husband is very anxious.  But denied any worsening symptoms.  Explained should his situation/ symptoms change not to hesitate to return to the emergency room.  She voices understanding.  She goes on to state that doctor told her husband he has only two years to live.  Explained to her that there are  medications and devices to help patients in her husbands situation and wait to discuss with Dr. Haroldine Laws first.  Pricilla Riffle RN [facility]

## 2020-12-20 LAB — CULTURE, BLOOD (ROUTINE X 2)
Culture: NO GROWTH
Culture: NO GROWTH

## 2020-12-23 ENCOUNTER — Ambulatory Visit: Payer: Medicare Other | Admitting: Family

## 2021-01-01 DIAGNOSIS — M545 Low back pain, unspecified: Secondary | ICD-10-CM | POA: Diagnosis not present

## 2021-01-01 DIAGNOSIS — G8929 Other chronic pain: Secondary | ICD-10-CM | POA: Diagnosis not present

## 2021-01-01 DIAGNOSIS — Z9189 Other specified personal risk factors, not elsewhere classified: Secondary | ICD-10-CM | POA: Diagnosis not present

## 2021-01-14 DIAGNOSIS — F17210 Nicotine dependence, cigarettes, uncomplicated: Secondary | ICD-10-CM | POA: Diagnosis not present

## 2021-01-14 DIAGNOSIS — Z79899 Other long term (current) drug therapy: Secondary | ICD-10-CM | POA: Diagnosis not present

## 2021-01-14 DIAGNOSIS — M545 Low back pain, unspecified: Secondary | ICD-10-CM | POA: Diagnosis not present

## 2021-01-14 DIAGNOSIS — Z6822 Body mass index (BMI) 22.0-22.9, adult: Secondary | ICD-10-CM | POA: Diagnosis not present

## 2021-01-14 DIAGNOSIS — F17200 Nicotine dependence, unspecified, uncomplicated: Secondary | ICD-10-CM | POA: Diagnosis not present

## 2021-01-14 DIAGNOSIS — G894 Chronic pain syndrome: Secondary | ICD-10-CM | POA: Diagnosis not present

## 2021-01-14 DIAGNOSIS — I2510 Atherosclerotic heart disease of native coronary artery without angina pectoris: Secondary | ICD-10-CM | POA: Diagnosis not present

## 2021-01-14 DIAGNOSIS — G8929 Other chronic pain: Secondary | ICD-10-CM | POA: Diagnosis not present

## 2021-01-15 DIAGNOSIS — J441 Chronic obstructive pulmonary disease with (acute) exacerbation: Secondary | ICD-10-CM | POA: Diagnosis not present

## 2021-02-11 DIAGNOSIS — M545 Low back pain, unspecified: Secondary | ICD-10-CM | POA: Diagnosis not present

## 2021-02-11 DIAGNOSIS — G894 Chronic pain syndrome: Secondary | ICD-10-CM | POA: Diagnosis not present

## 2021-02-11 DIAGNOSIS — G8929 Other chronic pain: Secondary | ICD-10-CM | POA: Diagnosis not present

## 2021-02-11 DIAGNOSIS — Z9189 Other specified personal risk factors, not elsewhere classified: Secondary | ICD-10-CM | POA: Diagnosis not present

## 2021-02-15 DIAGNOSIS — J441 Chronic obstructive pulmonary disease with (acute) exacerbation: Secondary | ICD-10-CM | POA: Diagnosis not present

## 2021-03-17 DIAGNOSIS — G8929 Other chronic pain: Secondary | ICD-10-CM | POA: Diagnosis not present

## 2021-03-17 DIAGNOSIS — I447 Left bundle-branch block, unspecified: Secondary | ICD-10-CM | POA: Diagnosis not present

## 2021-03-17 DIAGNOSIS — M545 Low back pain, unspecified: Secondary | ICD-10-CM | POA: Diagnosis not present

## 2021-03-17 DIAGNOSIS — Z6823 Body mass index (BMI) 23.0-23.9, adult: Secondary | ICD-10-CM | POA: Diagnosis not present

## 2021-03-17 DIAGNOSIS — G894 Chronic pain syndrome: Secondary | ICD-10-CM | POA: Diagnosis not present

## 2021-03-17 DIAGNOSIS — I10 Essential (primary) hypertension: Secondary | ICD-10-CM | POA: Diagnosis not present

## 2021-03-17 DIAGNOSIS — R0989 Other specified symptoms and signs involving the circulatory and respiratory systems: Secondary | ICD-10-CM | POA: Diagnosis not present

## 2021-03-17 DIAGNOSIS — E785 Hyperlipidemia, unspecified: Secondary | ICD-10-CM | POA: Diagnosis not present

## 2021-03-17 DIAGNOSIS — I420 Dilated cardiomyopathy: Secondary | ICD-10-CM | POA: Diagnosis not present

## 2021-03-17 DIAGNOSIS — I2510 Atherosclerotic heart disease of native coronary artery without angina pectoris: Secondary | ICD-10-CM | POA: Diagnosis not present

## 2021-03-17 DIAGNOSIS — E119 Type 2 diabetes mellitus without complications: Secondary | ICD-10-CM | POA: Diagnosis not present

## 2021-03-17 DIAGNOSIS — Z79899 Other long term (current) drug therapy: Secondary | ICD-10-CM | POA: Diagnosis not present

## 2021-03-17 DIAGNOSIS — I509 Heart failure, unspecified: Secondary | ICD-10-CM | POA: Diagnosis not present

## 2021-03-18 DIAGNOSIS — J441 Chronic obstructive pulmonary disease with (acute) exacerbation: Secondary | ICD-10-CM | POA: Diagnosis not present

## 2021-04-01 DIAGNOSIS — R0989 Other specified symptoms and signs involving the circulatory and respiratory systems: Secondary | ICD-10-CM | POA: Diagnosis not present

## 2021-04-01 DIAGNOSIS — I509 Heart failure, unspecified: Secondary | ICD-10-CM | POA: Diagnosis not present

## 2021-04-06 DIAGNOSIS — R0602 Shortness of breath: Secondary | ICD-10-CM | POA: Diagnosis not present

## 2021-04-06 DIAGNOSIS — I509 Heart failure, unspecified: Secondary | ICD-10-CM | POA: Diagnosis not present

## 2021-04-06 DIAGNOSIS — I2510 Atherosclerotic heart disease of native coronary artery without angina pectoris: Secondary | ICD-10-CM | POA: Diagnosis not present

## 2021-04-06 DIAGNOSIS — I420 Dilated cardiomyopathy: Secondary | ICD-10-CM | POA: Diagnosis not present

## 2021-04-06 DIAGNOSIS — I739 Peripheral vascular disease, unspecified: Secondary | ICD-10-CM | POA: Diagnosis not present

## 2021-04-13 DIAGNOSIS — Z79899 Other long term (current) drug therapy: Secondary | ICD-10-CM | POA: Diagnosis not present

## 2021-04-13 DIAGNOSIS — G894 Chronic pain syndrome: Secondary | ICD-10-CM | POA: Diagnosis not present

## 2021-04-13 DIAGNOSIS — M545 Low back pain, unspecified: Secondary | ICD-10-CM | POA: Diagnosis not present

## 2021-04-13 DIAGNOSIS — G8929 Other chronic pain: Secondary | ICD-10-CM | POA: Diagnosis not present

## 2021-04-28 ENCOUNTER — Other Ambulatory Visit: Payer: Self-pay | Admitting: Internal Medicine

## 2021-04-28 DIAGNOSIS — E785 Hyperlipidemia, unspecified: Secondary | ICD-10-CM | POA: Diagnosis not present

## 2021-04-28 DIAGNOSIS — I10 Essential (primary) hypertension: Secondary | ICD-10-CM | POA: Diagnosis not present

## 2021-04-28 DIAGNOSIS — R7302 Impaired glucose tolerance (oral): Secondary | ICD-10-CM | POA: Diagnosis not present

## 2021-04-28 DIAGNOSIS — R599 Enlarged lymph nodes, unspecified: Secondary | ICD-10-CM

## 2021-04-28 DIAGNOSIS — Z23 Encounter for immunization: Secondary | ICD-10-CM | POA: Diagnosis not present

## 2021-04-28 DIAGNOSIS — I509 Heart failure, unspecified: Secondary | ICD-10-CM | POA: Diagnosis not present

## 2021-04-28 DIAGNOSIS — E119 Type 2 diabetes mellitus without complications: Secondary | ICD-10-CM | POA: Diagnosis not present

## 2021-04-28 DIAGNOSIS — F17200 Nicotine dependence, unspecified, uncomplicated: Secondary | ICD-10-CM | POA: Diagnosis not present

## 2021-05-07 ENCOUNTER — Ambulatory Visit: Payer: Medicare Other

## 2021-05-13 DIAGNOSIS — I2510 Atherosclerotic heart disease of native coronary artery without angina pectoris: Secondary | ICD-10-CM | POA: Diagnosis not present

## 2021-05-13 DIAGNOSIS — Z6823 Body mass index (BMI) 23.0-23.9, adult: Secondary | ICD-10-CM | POA: Diagnosis not present

## 2021-05-13 DIAGNOSIS — Z0000 Encounter for general adult medical examination without abnormal findings: Secondary | ICD-10-CM | POA: Diagnosis not present

## 2021-05-13 DIAGNOSIS — G894 Chronic pain syndrome: Secondary | ICD-10-CM | POA: Diagnosis not present

## 2021-05-13 DIAGNOSIS — G8929 Other chronic pain: Secondary | ICD-10-CM | POA: Diagnosis not present

## 2021-05-13 DIAGNOSIS — M545 Low back pain, unspecified: Secondary | ICD-10-CM | POA: Diagnosis not present

## 2021-05-13 DIAGNOSIS — I509 Heart failure, unspecified: Secondary | ICD-10-CM | POA: Diagnosis not present

## 2021-05-13 DIAGNOSIS — Z79899 Other long term (current) drug therapy: Secondary | ICD-10-CM | POA: Diagnosis not present

## 2021-05-19 ENCOUNTER — Other Ambulatory Visit: Payer: Self-pay

## 2021-05-19 ENCOUNTER — Ambulatory Visit
Admission: RE | Admit: 2021-05-19 | Discharge: 2021-05-19 | Disposition: A | Discharge status: Home / Self Care | Payer: Medicare Other | Source: Ambulatory Visit | Attending: Internal Medicine | Admitting: Internal Medicine

## 2021-05-19 DIAGNOSIS — R599 Enlarged lymph nodes, unspecified: Secondary | ICD-10-CM | POA: Insufficient documentation

## 2021-05-19 DIAGNOSIS — R590 Localized enlarged lymph nodes: Secondary | ICD-10-CM | POA: Diagnosis not present

## 2021-05-21 ENCOUNTER — Other Ambulatory Visit (HOSPITAL_BASED_OUTPATIENT_CLINIC_OR_DEPARTMENT_OTHER): Payer: Self-pay | Admitting: Family

## 2021-05-21 ENCOUNTER — Other Ambulatory Visit: Payer: Self-pay | Admitting: Family

## 2021-05-21 DIAGNOSIS — R590 Localized enlarged lymph nodes: Secondary | ICD-10-CM

## 2021-06-09 DIAGNOSIS — I509 Heart failure, unspecified: Secondary | ICD-10-CM | POA: Diagnosis not present

## 2021-06-09 DIAGNOSIS — I10 Essential (primary) hypertension: Secondary | ICD-10-CM | POA: Diagnosis not present

## 2021-06-09 DIAGNOSIS — I2510 Atherosclerotic heart disease of native coronary artery without angina pectoris: Secondary | ICD-10-CM | POA: Diagnosis not present

## 2021-06-09 DIAGNOSIS — H6502 Acute serous otitis media, left ear: Secondary | ICD-10-CM | POA: Diagnosis not present

## 2021-06-09 DIAGNOSIS — E785 Hyperlipidemia, unspecified: Secondary | ICD-10-CM | POA: Diagnosis not present

## 2021-06-12 DIAGNOSIS — J069 Acute upper respiratory infection, unspecified: Secondary | ICD-10-CM | POA: Diagnosis not present

## 2021-06-12 DIAGNOSIS — E785 Hyperlipidemia, unspecified: Secondary | ICD-10-CM | POA: Diagnosis not present

## 2021-06-12 DIAGNOSIS — I10 Essential (primary) hypertension: Secondary | ICD-10-CM | POA: Diagnosis not present

## 2021-06-16 ENCOUNTER — Ambulatory Visit
Admission: RE | Admit: 2021-06-16 | Discharge: 2021-06-16 | Disposition: A | Discharge status: Home / Self Care | Payer: Medicare Other | Source: Ambulatory Visit | Attending: Family | Admitting: Family

## 2021-06-16 ENCOUNTER — Other Ambulatory Visit: Payer: Self-pay

## 2021-06-16 DIAGNOSIS — I6523 Occlusion and stenosis of bilateral carotid arteries: Secondary | ICD-10-CM | POA: Diagnosis not present

## 2021-06-16 DIAGNOSIS — R590 Localized enlarged lymph nodes: Secondary | ICD-10-CM | POA: Diagnosis not present

## 2021-06-16 DIAGNOSIS — M47812 Spondylosis without myelopathy or radiculopathy, cervical region: Secondary | ICD-10-CM | POA: Diagnosis not present

## 2021-06-16 LAB — POCT I-STAT CREATININE: Creatinine, Ser: 1.9 mg/dL — ABNORMAL HIGH (ref 0.61–1.24)

## 2021-06-16 MED ORDER — IOHEXOL 300 MG/ML  SOLN
60.0000 mL | Freq: Once | INTRAMUSCULAR | Status: AC | PRN
  Administered 2021-06-16: 60 mL via INTRAVENOUS

## 2021-06-16 MED ORDER — IOHEXOL 300 MG/ML  SOLN: 75.0000 mL | Freq: Once | INTRAMUSCULAR | Status: DC | PRN

## 2021-06-17 DIAGNOSIS — G8929 Other chronic pain: Secondary | ICD-10-CM | POA: Diagnosis not present

## 2021-06-17 DIAGNOSIS — Z79899 Other long term (current) drug therapy: Secondary | ICD-10-CM | POA: Diagnosis not present

## 2021-06-17 DIAGNOSIS — G894 Chronic pain syndrome: Secondary | ICD-10-CM | POA: Diagnosis not present

## 2021-06-17 DIAGNOSIS — R030 Elevated blood-pressure reading, without diagnosis of hypertension: Secondary | ICD-10-CM | POA: Diagnosis not present

## 2021-06-17 DIAGNOSIS — M545 Low back pain, unspecified: Secondary | ICD-10-CM | POA: Diagnosis not present

## 2021-07-10 DIAGNOSIS — I10 Essential (primary) hypertension: Secondary | ICD-10-CM | POA: Diagnosis not present

## 2021-07-10 DIAGNOSIS — E785 Hyperlipidemia, unspecified: Secondary | ICD-10-CM | POA: Diagnosis not present

## 2021-07-10 DIAGNOSIS — I2510 Atherosclerotic heart disease of native coronary artery without angina pectoris: Secondary | ICD-10-CM | POA: Diagnosis not present

## 2021-07-20 DIAGNOSIS — R030 Elevated blood-pressure reading, without diagnosis of hypertension: Secondary | ICD-10-CM | POA: Diagnosis not present

## 2021-07-20 DIAGNOSIS — M47816 Spondylosis without myelopathy or radiculopathy, lumbar region: Secondary | ICD-10-CM | POA: Diagnosis not present

## 2021-07-20 DIAGNOSIS — E559 Vitamin D deficiency, unspecified: Secondary | ICD-10-CM | POA: Diagnosis not present

## 2021-07-20 DIAGNOSIS — Z79899 Other long term (current) drug therapy: Secondary | ICD-10-CM | POA: Diagnosis not present

## 2021-07-20 DIAGNOSIS — M545 Low back pain, unspecified: Secondary | ICD-10-CM | POA: Diagnosis not present

## 2021-07-20 DIAGNOSIS — G894 Chronic pain syndrome: Secondary | ICD-10-CM | POA: Diagnosis not present

## 2021-07-20 DIAGNOSIS — G8929 Other chronic pain: Secondary | ICD-10-CM | POA: Diagnosis not present

## 2021-07-30 DIAGNOSIS — R221 Localized swelling, mass and lump, neck: Secondary | ICD-10-CM | POA: Diagnosis not present

## 2021-07-30 DIAGNOSIS — J3489 Other specified disorders of nose and nasal sinuses: Secondary | ICD-10-CM | POA: Diagnosis not present

## 2021-07-30 DIAGNOSIS — R599 Enlarged lymph nodes, unspecified: Secondary | ICD-10-CM | POA: Diagnosis not present

## 2021-08-06 ENCOUNTER — Other Ambulatory Visit: Payer: Self-pay | Admitting: Otolaryngology

## 2021-08-06 DIAGNOSIS — R599 Enlarged lymph nodes, unspecified: Secondary | ICD-10-CM

## 2021-08-09 DIAGNOSIS — I10 Essential (primary) hypertension: Secondary | ICD-10-CM | POA: Diagnosis not present

## 2021-08-09 DIAGNOSIS — I2510 Atherosclerotic heart disease of native coronary artery without angina pectoris: Secondary | ICD-10-CM | POA: Diagnosis not present

## 2021-08-09 DIAGNOSIS — E785 Hyperlipidemia, unspecified: Secondary | ICD-10-CM | POA: Diagnosis not present

## 2021-08-10 DIAGNOSIS — E785 Hyperlipidemia, unspecified: Secondary | ICD-10-CM | POA: Diagnosis not present

## 2021-08-10 DIAGNOSIS — I509 Heart failure, unspecified: Secondary | ICD-10-CM | POA: Diagnosis not present

## 2021-08-10 DIAGNOSIS — R7302 Impaired glucose tolerance (oral): Secondary | ICD-10-CM | POA: Diagnosis not present

## 2021-08-10 DIAGNOSIS — I10 Essential (primary) hypertension: Secondary | ICD-10-CM | POA: Diagnosis not present

## 2021-08-14 ENCOUNTER — Ambulatory Visit
Admission: RE | Admit: 2021-08-14 | Discharge: 2021-08-14 | Disposition: A | Discharge status: Home / Self Care | Payer: Medicare Other | Source: Ambulatory Visit | Attending: Otolaryngology | Admitting: Otolaryngology

## 2021-08-14 DIAGNOSIS — C801 Malignant (primary) neoplasm, unspecified: Secondary | ICD-10-CM

## 2021-08-14 DIAGNOSIS — C770 Secondary and unspecified malignant neoplasm of lymph nodes of head, face and neck: Secondary | ICD-10-CM | POA: Insufficient documentation

## 2021-08-14 DIAGNOSIS — C779 Secondary and unspecified malignant neoplasm of lymph node, unspecified: Secondary | ICD-10-CM

## 2021-08-14 DIAGNOSIS — R590 Localized enlarged lymph nodes: Secondary | ICD-10-CM | POA: Diagnosis not present

## 2021-08-14 DIAGNOSIS — R599 Enlarged lymph nodes, unspecified: Secondary | ICD-10-CM

## 2021-08-14 HISTORY — DX: Secondary and unspecified malignant neoplasm of lymph node, unspecified: C80.1

## 2021-08-14 HISTORY — DX: Secondary and unspecified malignant neoplasm of lymph node, unspecified: C77.9

## 2021-08-17 ENCOUNTER — Encounter: Payer: Self-pay | Admitting: Internal Medicine

## 2021-08-17 ENCOUNTER — Other Ambulatory Visit: Payer: Self-pay | Admitting: Internal Medicine

## 2021-08-19 DIAGNOSIS — G8929 Other chronic pain: Secondary | ICD-10-CM | POA: Diagnosis not present

## 2021-08-19 DIAGNOSIS — G894 Chronic pain syndrome: Secondary | ICD-10-CM | POA: Diagnosis not present

## 2021-08-19 DIAGNOSIS — M545 Low back pain, unspecified: Secondary | ICD-10-CM | POA: Diagnosis not present

## 2021-08-19 DIAGNOSIS — Z0000 Encounter for general adult medical examination without abnormal findings: Secondary | ICD-10-CM | POA: Diagnosis not present

## 2021-08-19 DIAGNOSIS — R030 Elevated blood-pressure reading, without diagnosis of hypertension: Secondary | ICD-10-CM | POA: Diagnosis not present

## 2021-08-19 DIAGNOSIS — M47816 Spondylosis without myelopathy or radiculopathy, lumbar region: Secondary | ICD-10-CM | POA: Diagnosis not present

## 2021-08-19 DIAGNOSIS — Z79899 Other long term (current) drug therapy: Secondary | ICD-10-CM | POA: Diagnosis not present

## 2021-08-20 ENCOUNTER — Other Ambulatory Visit: Payer: Medicare Other

## 2021-08-20 NOTE — Progress Notes (Signed)
Tumor Board Documentation  ISON WICHMANN was presented by Dr Rogue Bussing at our Tumor Board on 08/20/2021, which included representatives from medical oncology, radiation oncology, radiology, pathology, navigation, internal medicine, genetics, research, pulmonology, surgical, pharmacy, nutrition, palliative care.  Tighe currently presents as a new patient, for [place], for new positive pathology with history of the following treatments: surgical intervention(s).  Additionally, we reviewed previous medical and familial history, history of present illness, and recent lab results along with all available histopathologic and imaging studies. The tumor board considered available treatment options and made the following recommendations:   Pt will see Dr Rogue Bussing 08/21/21 and he will decide tx after that  The following procedures/referrals were also placed: No orders of the defined types were placed in this encounter.   Clinical Trial Status: not eligible   Staging used: To be determined AJCC Staging:       Group: Metastatic Squamous cell Carcinoma   National site-specific guidelines   were discussed with respect to the case.  Tumor board is a meeting of clinicians from various specialty areas who evaluate and discuss patients for whom a multidisciplinary approach is being considered. Final determinations in the plan of care are those of the provider(s). The responsibility for follow up of recommendations given during tumor board is that of the provider.   Todays extended care, comprehensive team conference, [place] was not present for the discussion and was not examined.   Multidisciplinary Tumor Board is a multidisciplinary case peer review process.  Decisions discussed in the Multidisciplinary Tumor Board reflect the opinions of the specialists present at the conference without having examined the patient.  Ultimately, treatment and diagnostic decisions rest with the primary provider(s) and  the patient.

## 2021-08-21 ENCOUNTER — Inpatient Hospital Stay: Payer: Medicare Other

## 2021-08-21 ENCOUNTER — Inpatient Hospital Stay: Payer: Medicare Other | Attending: Internal Medicine | Admitting: Internal Medicine

## 2021-08-21 ENCOUNTER — Encounter: Payer: Self-pay | Admitting: Internal Medicine

## 2021-08-21 ENCOUNTER — Encounter (INDEPENDENT_AMBULATORY_CARE_PROVIDER_SITE_OTHER): Payer: Self-pay

## 2021-08-21 ENCOUNTER — Other Ambulatory Visit: Payer: Self-pay

## 2021-08-21 DIAGNOSIS — N183 Chronic kidney disease, stage 3 unspecified: Secondary | ICD-10-CM | POA: Diagnosis not present

## 2021-08-21 DIAGNOSIS — Z79899 Other long term (current) drug therapy: Secondary | ICD-10-CM | POA: Insufficient documentation

## 2021-08-21 DIAGNOSIS — I130 Hypertensive heart and chronic kidney disease with heart failure and stage 1 through stage 4 chronic kidney disease, or unspecified chronic kidney disease: Secondary | ICD-10-CM | POA: Insufficient documentation

## 2021-08-21 DIAGNOSIS — J449 Chronic obstructive pulmonary disease, unspecified: Secondary | ICD-10-CM | POA: Diagnosis not present

## 2021-08-21 DIAGNOSIS — C801 Malignant (primary) neoplasm, unspecified: Secondary | ICD-10-CM | POA: Insufficient documentation

## 2021-08-21 DIAGNOSIS — F17210 Nicotine dependence, cigarettes, uncomplicated: Secondary | ICD-10-CM | POA: Insufficient documentation

## 2021-08-21 DIAGNOSIS — C770 Secondary and unspecified malignant neoplasm of lymph nodes of head, face and neck: Secondary | ICD-10-CM | POA: Diagnosis not present

## 2021-08-21 DIAGNOSIS — I2510 Atherosclerotic heart disease of native coronary artery without angina pectoris: Secondary | ICD-10-CM | POA: Insufficient documentation

## 2021-08-21 DIAGNOSIS — C7989 Secondary malignant neoplasm of other specified sites: Secondary | ICD-10-CM | POA: Insufficient documentation

## 2021-08-21 NOTE — Progress Notes (Signed)
[place] NOTE  Patient Care Team: Perrin Maltese, MD as PCP - General (Internal Medicine)  CHIEF COMPLAINTS/PURPOSE OF CONSULTATION: head and neck cancer  #  Oncology History Overview Note  # DEC 2023- CT scan neck-Four abnormal level 5 lymph nodes on the left asThe largest 2 may have enlarged slightly since the ultrasound of 1 month ago. The differential diagnosis is that of reactive nodal enlargement versus malignant nodal disease. Consider either additional close clinical follow-up or fine-needle aspiration of the largest and most inferior level 5 node.  # s/p FNA- left neck LN- [dr.Bennett; FEB 2023]- DIAGNOSIS:  A. LYMPH NODE, LEFT CERVICAL; ULTRASOUND-GUIDED FINE NEEDLE ASPIRATION:  - DIAGNOSTIC OF MALIGNANCY.  - METASTATIC SQUAMOUS CELL CARCINOMA.   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  Accompanied by his wife.  [name] 66 y.o.  male active smoker [1/2 ppd]; has been referred to [place] for further evaluation recommendations for left neck squamous cell carcinoma.  Patient noted to have "lumps" left neck late last year.  Ultrasound followed by CT scan showed-up to 5 lymph nodes in the neck which progressively gotten worse.  Eventually evaluated by Dr. Hope Budds.  ENT exam negative for any obvious cause of patient's lymphadenopathy.  FNA cytology positive for squamous cell  Patient denies any pain.  Denies any difficulty swallowing.  Weight loss attributed to depression with a new diagnosis.  Review of Systems  Constitutional:  Positive for malaise/fatigue and weight loss. Negative for chills, diaphoresis and fever.  HENT:  Negative  for nosebleeds and sore throat.   Eyes:  Negative for double vision.  Respiratory:  Negative for cough, hemoptysis, sputum production, shortness of breath and wheezing.   Cardiovascular:  Negative for chest pain, palpitations, orthopnea and leg swelling.  Gastrointestinal:  Negative for abdominal pain, blood in stool, constipation, diarrhea, heartburn, melena, nausea and vomiting.  Genitourinary:  Negative for dysuria, frequency and urgency.  Musculoskeletal:  Positive for back pain and joint pain.  Skin: Negative.  Negative for itching and rash.  Neurological:  Negative for dizziness, tingling, focal weakness, weakness and headaches.  Endo/Heme/Allergies:  Does not bruise/bleed easily.  Psychiatric/Behavioral:  Negative for depression. The patient is not nervous/anxious and does not have insomnia.     MEDICAL HISTORY:  Past Medical History:  Diagnosis Date   Anxiety    Arthritis    knees, lower back   Bronchitis    Cardiomyopathy ([place])    CHF (congestive heart failure) (HCC)    Chronic back pain    Complication of anesthesia    has trouble keeping pt asleep due to taking narcotics qid   Coronary artery disease    Diverticulitis    Hepatitis C virus infection without hepatic coma    received treatment. clear now   History of kidney stones    Hypertension     SURGICAL HISTORY: Past Surgical History:  Procedure Laterality Date   BACK SURGERY     CARDIAC CATHETERIZATION     CHOLECYSTECTOMY     COLON RESECTION  2007   due to diverticulitis   COLONOSCOPY WITH PROPOFOL N/A 03/29/2017   Procedure: COLONOSCOPY WITH PROPOFOL;  Surgeon: Lucilla Lame,  MD;  Location: [facility] END[facility]COPY;  Service: Endoscopy;  Laterality: N/A;   CYSTOSCOPY/URETEROSCOPY/HOLMIUM LASER/STENT PLACEMENT Left 07/16/2019   Procedure: CYSTOSCOPY/URETEROSCOPY/HOLMIUM LASER/STENT Exchange;  Surgeon: Hollice Espy, MD;  Location: [facility] ORS;  Service: Urology;  Laterality: Left;   ESOPHAGOGASTRODUODENOSCOPY (EGD) WITH  PROPOFOL N/A 03/29/2017   Procedure: ESOPHAGOGASTRODUODENOSCOPY (EGD) WITH PROPOFOL;  Surgeon: Lucilla Lame, MD;  Location: [facility] ENDOSCOPY;  Service: Endoscopy;  Laterality: N/A;   FINGER SURGERY  2007   cut finger off with saw and was reattached   HERNIA REPAIR     HOLMIUM LASER APPLICATION N/A 19/41/7408   Procedure: HOLMIUM LASER APPLICATION;  Surgeon: Hollice Espy, MD;  Location: [facility] ORS;  Service: Urology;  Laterality: N/A;   IR NEPHROSTOMY PLACEMENT LEFT  05/28/2019   KIDNEY SURGERY  2021   KNEE ARTHROSCOPY Bilateral    LOWER EXTREMITY ANGIOGRAPHY Right 11/09/2017   Procedure: LOWER EXTREMITY ANGIOGRAPHY;  Surgeon: Katha Cabal, MD;  Location: [facility];  Service: Cardiovascular;  Laterality: Right;   LOWER EXTREMITY ANGIOGRAPHY Right 11/21/2018   Procedure: LOWER EXTREMITY ANGIOGRAPHY;  Surgeon: Katha Cabal, MD;  Location: [place] [facility];  Service: Cardiovascular;  Laterality: Right;   LOWER EXTREMITY ANGIOGRAPHY Right 09/18/2019   Procedure: LOWER EXTREMITY ANGIOGRAPHY;  Surgeon: Katha Cabal, MD;  Location: [place] [facility];  Service: Cardiovascular;  Laterality: Right;   NEPHROLITHOTOMY Left 05/28/2019   Procedure: NEPHROLITHOTOMY PERCUTANEOUS;  Surgeon: Hollice Espy, MD;  Location: [facility] OR[facility];  Service: Urology;  Laterality: Left;   SPINAL FUSION      SOCIAL HISTORY: Social History   Socioeconomic History   Marital status: Married    Spouse name: Vaughan Basta   Number of children: 12   Years of education: Not on file   Highest education level: Not on file  Occupational History   Occupation: disabled  Tobacco Use   Smoking status: Every Day    Packs/day: 0.50    Years: 25.00    Pack years: 12.50    Types: Cigarettes   Smokeless tobacco: Never  Vaping Use   Vaping Use: Never used  Substance and Sexual Activity   Alcohol use: No   Drug use: No   Sexual activity: Not Currently  Other Topics Concern   Not on file  Social History  Narrative   2 great children living with pt. & wife; in [place]. Smoker 1/2ppd; no alcohol. 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    Social Determinants of Health   Financial Resource Strain: Not on file  Food Insecurity: Not on file  Transportation Needs: Not on file  Physical Activity: Not on file  Stress: Not on file  Social Connections: Not on file  Intimate Partner Violence: Not on file    FAMILY HISTORY: Family History  Problem Relation Age of Onset   COPD Mother     ALLERGIES:  is allergic to codeine and dilaudid [hydromorphone].  MEDICATIONS:  Current Outpatient Medications  Medication Sig Dispense Refill   albuterol (VENTOLIN HFA) 108 (90 Base) MCG/ACT inhaler Inhale 2 puffs into the lungs every 6 (six) hours as needed for wheezing or shortness of breath.     ALPRAZolam (XANAX) 1 MG tablet Take 1 mg by mouth 3 (three) times daily.  5   atorvastatin (LIPITOR) 80 MG tablet Take 1 tablet by mouth daily.     carvedilol (COREG) 25 MG tablet Take 25 mg by mouth 2 (two) times daily.      clopidogrel (PLAVIX) 75 MG tablet Take 1 tablet (  75 mg total) by mouth daily. 30 tablet 11   ENTRESTO 49-51 MG Take 1 tablet by mouth 2 (two) times daily.     FARXIGA 10 MG TABS tablet Take 10 mg by mouth daily.     fluticasone (FLONASE) 50 MCG/ACT nasal spray Place 1 spray into both nostrils daily as needed for allergies.      furosemide (LASIX) 80 MG tablet Take 1 tablet (80 mg total) by mouth 2 (two) times daily. (Patient taking differently: Take 40 mg by mouth 2 (two) times daily.) 60 tablet 11   oxyCODONE (ROXICODONE) 15 MG immediate release tablet Take 10 mg by mouth 4 (four) times daily.     ipratropium-albuterol (DUONEB) 0.5-2.5 (3) MG/3ML SOLN Take 3 mLs by nebulization every 4 (four) hours as needed (for SOB). (Patient not taking: Reported on 08/21/2021) 360 mL 0   potassium chloride SA (KLOR-CON) 20 MEQ tablet Take 1 tablet (20 mEq total) by mouth daily. (Patient not taking:  Reported on 08/21/2021) 30 tablet 1   No current facility-administered medications for this visit.     PHYSICAL EXAMINATION: ECOG PERFORMANCE STATUS: 1 - Symptomatic but completely ambulatory  Vitals:   08/21/21 1404  BP: 127/79  Pulse: 75  Temp: (!) 95.4 F (35.2 C)  SpO2: 99%   Filed Weights   08/21/21 1404  Weight: 141 lb 8 oz (64.2 kg)   Left neck at least 3 lymph nodes felt; largest approximately 1.5 to 2 cm lower posterior.  Mobile nontender.  Physical Exam Vitals and nursing note reviewed.  HENT:     Head: Normocephalic and atraumatic.     Mouth/Throat:     Pharynx: Oropharynx is clear.  Eyes:     Extraocular Movements: Extraocular movements intact.     Pupils: Pupils are equal, round, and reactive to light.  Cardiovascular:     Rate and Rhythm: Normal rate and regular rhythm.  Pulmonary:     Comments: Decreased breath sounds bilaterally.  Abdominal:     Palpations: Abdomen is soft.  Musculoskeletal:        General: Normal range of motion.     Cervical back: Normal range of motion.  Skin:    General: Skin is warm.  Neurological:     General: No focal deficit present.     Mental Status: He is alert and oriented to person, place, and time.  Psychiatric:        Behavior: Behavior normal.        Judgment: Judgment normal.     LABORATORY DATA:  I have reviewed the data as listed Lab Results  Component Value Date   WBC 8.6 12/14/2020   HGB 13.5 12/14/2020   HCT 41.0 12/14/2020   MCV 78.8 (L) 12/14/2020   PLT 286 12/14/2020   Recent Labs    12/13/20 2353 12/14/20 0613 12/15/20 0629 06/16/21 1540  NA 128* 129* 133*  --   K 4.7 4.5 4.7  --   CL 93* 92* 91*  --   CO2 26 24 31   --   GLUCOSE 111* 136* 152*  --   BUN 30* 28* 33*  --   CREATININE 1.53* 1.57* 1.58* 1.90*  CALCIUM 8.7* 9.0 8.9  --   GFRNONAA 51* 50* 49*  --     RADIOGRAPHIC STUDIES: I have personally reviewed the radiological images as listed and agreed with the findings in the  report. [place] FNA SOFT TISSUE  Result Date: 08/14/2021 INDICATION: LT enlarged lymph node EXAM: Ultrasound-guided FNA biopsy  of left cervical lymph node MEDICATIONS: None. ANESTHESIA/SEDATION: Local analgesia FLUOROSCOPY TIME:  N/a COMPLICATIONS: None immediate. PROCEDURE: Informed written consent was obtained from the patient after a thorough discussion of the procedural risks, benefits and alternatives. All questions were addressed. Maximal Sterile Barrier Technique was utilized including caps, mask, sterile gowns, sterile gloves, sterile drape, hand hygiene and skin antiseptic. A timeout was performed prior to the initiation of the procedure. The patient was placed supine on the exam table. Limited ultrasound of the left neck was performed, which again demonstrated an enlarged left cervical lymph node. Skin entry site was marked, and the overlying skin was prepped and draped in the standard sterile fashion. Local analgesia was obtained with 1% lidocaine. Under ultrasound guidance, fine-needle aspiration of the target left cervical lymph node was performed using 22 gauge needle x4 passes. Specimens were submitted to pathology technologist to ensure adequacy. Postprocedure imaging demonstrated no hematoma. A clean dressing was placed after manual hemostasis. The patient tolerated the procedure well without immediate complication. IMPRESSION: Successful ultrasound-guided fine-needle aspiration of enlarged left cervical lymph node. Electronically Signed   By: Albin Felling M.D.   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  #Discussed with patient and wife the pathology; and the need for further imaging including the PET scan to find out the  primary.  Discussed the primary could be-head and neck vs lung; less likely skin.  Incidentally, ENT exam negative for any obvious source of primary [as per the discussion with Dr. Richardson Landry  #Discussed that the treatment options would include definitive chemoradiation therapy vs-systemic therapy.  We will make a referral to Dr. Donella Stade. .Usually the treatments last 6-8 weeks; and I would recommend generally weekly carbo-Taxol [patient a poor candidate for cisplatin GFR-40-50 ]/ along with radiation Monday through Friday.  We will get labs from PCPs office.  #Will make referral to nutrition; possible port-based upon above work-up/treatment plan.  #Peripheral vascular disease-on Plavix/? CAD- on lasix/Entresto/farxiga [Dr.Khan; card].  Clinically compensated at this time.  #CKD-stage III; await labs from PCPs office.  Need to monitor closely on systemic therapy  # COPD: trilegy; using- nebs prn.  Clinically stable.  # PN- 1-monitor closely on systemic therapy.  # Smoking:active smoker-recommend quitting smoking.  Thank you Dr. Richardson Landry for allowing me to participate in the care of your pleasant patient. Please do not hesitate to contact me with questions or concerns in the interim.   # DISPOSITION:get labs from [name] office/sign release # No labs today- # refer to Dr.Chrystal re: metastatic SCC of neck.  # follow up 1-2 days after PET scan- Dr.B  # I reviewed the blood work- with the patient in detail; also reviewed the imaging independently [as summarized above]; and with the patient in detail.   Cc; PCP/Dr.Khan,cards       All questions were answered. The patient knows to call the clinic with any problems, questions or concerns.       Cammie Sickle, MD 08/21/2021 4:43 PM

## 2021-08-21 NOTE — Assessment & Plan Note (Addendum)
#  Squamous cell carcinoma of unknown primary- [CT scan December 2022 neck-left-sided 5 lymph nodes-largest approximately 1 to 2 cm in size]; s/p FNA-positive for squamous cell carcinoma.  #Discussed with patient and wife the pathology; and the need for further imaging including the PET scan to find out the primary.  Discussed the primary could be-head and neck vs lung; less likely skin.  Incidentally, ENT exam negative for any obvious source of primary [as per the discussion with Dr. Richardson Landry  #Discussed that the treatment options would include definitive chemoradiation therapy vs-systemic therapy.  We will make a referral to Dr. Donella Stade. .Usually the treatments last 6-8 weeks; and I would recommend generally weekly carbo-Taxol [patient a poor candidate for cisplatin GFR-40-50 ]/ along with radiation Monday through Friday.  We will get labs from PCPs office.  #Will make referral to nutrition; possible port-based upon above work-up/treatment plan.  #Peripheral vascular disease-on Plavix/? CAD- on lasix/Entresto/farxiga [Dr.Khan; card].  Clinically compensated at this time.  #CKD-stage III; await labs from PCPs office.  Need to monitor closely on systemic therapy  # COPD: trilegy; using- nebs prn.  Clinically stable.  # PN- 1-monitor closely on systemic therapy.  # Smoking:active smoker-recommend quitting smoking.  Thank you Dr. Richardson Landry for allowing me to participate in the care of your pleasant patient. Please do not hesitate to contact me with questions or concerns in the interim.   # DISPOSITION:get labs from [place] office/sign release # No labs today- # refer to Dr.Chrystal re: metastatic SCC of neck.  # follow up 1-2 days after PET scan- Dr.B  # I reviewed the blood work- with the patient in detail; also reviewed the imaging independently [as summarized above]; and with the patient in detail.   Cc; PCP/Dr.Khan,cards

## 2021-08-24 ENCOUNTER — Institutional Professional Consult (permissible substitution): Payer: Medicare Other | Admitting: Radiation Oncology

## 2021-08-26 ENCOUNTER — Encounter: Payer: Self-pay | Admitting: Radiation Oncology

## 2021-08-26 ENCOUNTER — Other Ambulatory Visit: Payer: Self-pay

## 2021-08-26 ENCOUNTER — Ambulatory Visit
Admission: RE | Admit: 2021-08-26 | Discharge: 2021-08-26 | Disposition: A | Discharge status: Home / Self Care | Payer: Medicare Other | Source: Ambulatory Visit | Attending: Radiation Oncology | Admitting: Radiation Oncology

## 2021-08-26 VITALS — BP 111/72 | HR 81 | Temp 96.3°F | Resp 16 | Wt 142.2 lb

## 2021-08-26 DIAGNOSIS — C7989 Secondary malignant neoplasm of other specified sites: Secondary | ICD-10-CM

## 2021-08-26 DIAGNOSIS — I10 Essential (primary) hypertension: Secondary | ICD-10-CM | POA: Insufficient documentation

## 2021-08-26 DIAGNOSIS — I2510 Atherosclerotic heart disease of native coronary artery without angina pectoris: Secondary | ICD-10-CM | POA: Diagnosis not present

## 2021-08-26 DIAGNOSIS — I429 Cardiomyopathy, unspecified: Secondary | ICD-10-CM | POA: Insufficient documentation

## 2021-08-26 DIAGNOSIS — Z87442 Personal history of urinary calculi: Secondary | ICD-10-CM | POA: Insufficient documentation

## 2021-08-26 DIAGNOSIS — Z79899 Other long term (current) drug therapy: Secondary | ICD-10-CM | POA: Diagnosis not present

## 2021-08-26 DIAGNOSIS — C801 Malignant (primary) neoplasm, unspecified: Secondary | ICD-10-CM | POA: Diagnosis not present

## 2021-08-26 DIAGNOSIS — Z7984 Long term (current) use of oral hypoglycemic drugs: Secondary | ICD-10-CM | POA: Diagnosis not present

## 2021-08-26 DIAGNOSIS — F17210 Nicotine dependence, cigarettes, uncomplicated: Secondary | ICD-10-CM | POA: Insufficient documentation

## 2021-08-26 DIAGNOSIS — I509 Heart failure, unspecified: Secondary | ICD-10-CM | POA: Diagnosis not present

## 2021-08-26 DIAGNOSIS — C770 Secondary and unspecified malignant neoplasm of lymph nodes of head, face and neck: Secondary | ICD-10-CM | POA: Insufficient documentation

## 2021-08-26 NOTE — Consult Note (Signed)
NEW PATIENT EVALUATION  Name: [name]  MRN: [id number]  Date:   08/26/2021     DOB: [birth year]   This 66 y.o. male patient presents to the clinic for initial evaluation of squama cell carcinoma of the head and neck of unknown primary at this point presenting with left-sided lymph nodes.  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   PREVIOUS INVESTIGATIONS:  CT scans reviewed and Pathology report reviewed Clinical notes reviewed  HPI: Patient is a 66 year old male who his son expired from advanced head and neck cancer.  He was always checking his neck and discovered some left sided lymph nodes.  He underwent a fine-needle aspiration this month which was diagnostic of metastatic squamous cell carcinoma.  CT scan shows abnormal level 5 lymph nodes on the left side.  He is scheduled for PET scan which is awaiting insurance company approval.  He specifically [place] dysphagia or head and neck pain.  PLANNED TREATMENT REGIMEN: Concurrent chemoradiation  PAST MEDICAL HISTORY:  has a past medical history of Anxiety, Arthritis, Bronchitis, Cardiomyopathy ([place]), CHF (congestive heart failure) ([place]), Chronic back pain, Complication of anesthesia, Coronary artery disease, Diverticulitis, Hepatitis C virus infection without hepatic coma, History of kidney stones, and Hypertension.    PAST SURGICAL HISTORY:  Past Surgical History:  Procedure Laterality Date   BACK SURGERY     CARDIAC CATHETERIZATION     CHOLECYSTECTOMY     COLON RESECTION  2007   due to diverticulitis   COLONOSCOPY WITH PROPOFOL N/A 03/29/2017   Procedure: COLONOSCOPY WITH PROPOFOL;  Surgeon: Lucilla Lame, MD;  Location: [facility] ENDOSCOPY;  Service: Endoscopy;  Laterality: N/A;   CYSTOSCOPY/URETEROSCOPY/HOLMIUM  LASER/STENT PLACEMENT Left 07/16/2019   Procedure: CYSTOSCOPY/URETEROSCOPY/HOLMIUM LASER/STENT Exchange;  Surgeon: Hollice Espy, MD;  Location: [facility] ORS;  Service: Urology;  Laterality: Left;   ESOPHAGOGASTRODUODENOSCOPY (EGD) WITH PROPOFOL N/A 03/29/2017   Procedure: ESOPHAGOGASTRODUODENOSCOPY (EGD) WITH PROPOFOL;  Surgeon: Lucilla Lame, MD;  Location: [facility] ENDOSCOPY;  Service: Endoscopy;  Laterality: N/A;   FINGER SURGERY  2007   cut finger off with saw and was reattached   HERNIA REPAIR     HOLMIUM LASER APPLICATION N/A 81/44/8185   Procedure: HOLMIUM LASER APPLICATION;  Surgeon: Hollice Espy, MD;  Location: [facility] ORS;  Service: Urology;  Laterality: N/A;   IR NEPHROSTOMY PLACEMENT LEFT  05/28/2019   KIDNEY SURGERY  2021   KNEE ARTHROSCOPY Bilateral    LOWER EXTREMITY ANGIOGRAPHY Right 11/09/2017   Procedure: LOWER EXTREMITY ANGIOGRAPHY;  Surgeon: Katha Cabal, MD;  Location: [place] CV LAB;  Service: Cardiovascular;  Laterality: Right;   LOWER EXTREMITY ANGIOGRAPHY Right 11/21/2018   Procedure: LOWER EXTREMITY ANGIOGRAPHY;  Surgeon: Katha Cabal, MD;  Location: [place] CV LAB;  Service: Cardiovascular;  Laterality: Right;   LOWER EXTREMITY ANGIOGRAPHY Right 09/18/2019   Procedure: LOWER EXTREMITY ANGIOGRAPHY;  Surgeon: Katha Cabal, MD;  Location: [place] [facility];  Service: Cardiovascular;  Laterality: Right;   NEPHROLITHOTOMY Left 05/28/2019   Procedure: NEPHROLITHOTOMY PERCUTANEOUS;  Surgeon: Hollice Espy, MD;  Location: [facility] ORS;  Service: Urology;  Laterality: Left;   SPINAL FUSION      FAMILY HISTORY: family history includes COPD in his mother.  SOCIAL HISTORY:  reports that he has been smoking cigarettes. He has a 12.50  pack-year smoking history. He has never used smokeless tobacco. He reports that he does not drink alcohol and does not use drugs.  ALLERGIES: Codeine and Dilaudid [hydromorphone]  MEDICATIONS:  Current Outpatient Medications   Medication Sig Dispense Refill   albuterol (VENTOLIN HFA) 108 (90 Base) MCG/ACT inhaler Inhale 2 puffs into the lungs every 6 (six) hours as needed for wheezing or shortness of breath.     ALPRAZolam (XANAX) 1 MG tablet Take 1 mg by mouth 3 (three) times daily.  5   atorvastatin (LIPITOR) 80 MG tablet Take 1 tablet by mouth daily.     carvedilol (COREG) 25 MG tablet Take 25 mg by mouth 2 (two) times daily.      clopidogrel (PLAVIX) 75 MG tablet Take 1 tablet (75 mg total) by mouth daily. 30 tablet 11   ENTRESTO 49-51 MG Take 1 tablet by mouth 2 (two) times daily.     FARXIGA 10 MG TABS tablet Take 10 mg by mouth daily.     fluticasone (FLONASE) 50 MCG/ACT nasal spray Place 1 spray into both nostrils daily as needed for allergies.      furosemide (LASIX) 80 MG tablet Take 1 tablet (80 mg total) by mouth 2 (two) times daily. (Patient taking differently: Take 40 mg by mouth 2 (two) times daily.) 60 tablet 11   nicotine (NICODERM CQ - DOSED IN MG/24 HR) 7 mg/24hr patch      oxyCODONE (ROXICODONE) 15 MG immediate release tablet Take 10 mg by mouth 4 (four) times daily.     ipratropium-albuterol (DUONEB) 0.5-2.5 (3) MG/3ML SOLN Take 3 mLs by nebulization every 4 (four) hours as needed (for SOB). (Patient not taking: Reported on 08/21/2021) 360 mL 0   potassium chloride SA (KLOR-CON) 20 MEQ tablet Take 1 tablet (20 mEq total) by mouth daily. (Patient not taking: Reported on 08/21/2021) 30 tablet 1   No current facility-administered medications for this encounter.    ECOG PERFORMANCE STATUS:  0 - Asymptomatic  REVIEW OF SYSTEMS: Patient denies any weight loss, fatigue, weakness, fever, chills or night sweats. Patient denies any loss of vision, blurred vision. Patient denies any ringing  of the ears or hearing loss. No irregular heartbeat. Patient denies heart murmur or history of fainting. Patient denies any chest pain or pain radiating to her upper extremities. Patient denies any shortness of breath,  difficulty breathing at night, cough or hemoptysis. Patient denies any swelling in the lower legs. Patient denies any nausea vomiting, vomiting of blood, or coffee ground material in the vomitus. Patient denies any stomach pain. Patient states has had normal bowel movements no significant constipation or diarrhea. Patient denies any dysuria, hematuria or significant nocturia. Patient denies any problems walking, swelling in the joints or loss of balance. Patient denies any skin changes, loss of hair or loss of weight. Patient denies any excessive worrying or anxiety or significant depression. Patient denies any problems with insomnia. Patient denies excessive thirst, polyuria, polydipsia. Patient denies any swollen glands, patient denies easy bruising or easy bleeding. Patient denies any recent infections, allergies or URI. Patient "s visual fields have not changed significantly in recent time.   PHYSICAL EXAM: BP 111/72 (BP Location: Left Arm, Patient Position: Sitting)    Pulse 81    Temp (!) 96.3 F (35.7 C) (Tympanic)    Resp 16    Wt 142 lb 3.2 oz (64.5 kg)    BMI 22.27 kg/m  There is some subtle nodes present in the lower aspect of the  left cervical chain.  No supraclavicular adenopathy is appreciated.  Right neck is clear.  Well-developed well-nourished patient in NAD. HEENT reveals PERLA, EOMI, discs not visualized.  Oral cavity is clear. No oral mucosal lesions are identified. Neck is clear without evidence of cervical or supraclavicular adenopathy. Lungs are clear to A&P. Cardiac examination is essentially unremarkable with regular rate and rhythm without murmur rub or thrill. Abdomen is benign with no organomegaly or masses noted. Motor sensory and DTR levels are equal and symmetric in the upper and lower extremities. Cranial nerves II through XII are grossly intact. Proprioception is intact. No peripheral adenopathy or edema is identified. No motor or sensory levels are noted. Crude visual fields  are within normal range.  LABORATORY DATA: Pathology reports reviewed    RADIOLOGY RESULTS: CT scans reviewed PET CT scan has been ordered   IMPRESSION: Locally advanced head and neck cancer of unknown primary at this point pending PET/CT evaluation  PLAN: This time of gone over treatment recommendations including concurrent chemoradiation therapy for locally advanced head and neck cancer.  We are awaiting the PET scan to determine if we can track a site of origin either base of tongue piriform sinus or possible nasopharynx.  Risks and benefits of treatment including dysphagia, oral mucositis, alteration of taste skin reaction fatigue alteration blood counts all were described in detail to the patient.  He comprehends my recommendations well.  We will set him up for CT simulation once we have PET CT scan to review and analyzed.  I would like to take this opportunity to thank you for allowing me to participate in the care of your patient.Noreene Filbert, MD

## 2021-08-27 ENCOUNTER — Other Ambulatory Visit: Payer: Self-pay | Admitting: *Deleted

## 2021-08-27 ENCOUNTER — Other Ambulatory Visit: Payer: Self-pay | Admitting: Otolaryngology

## 2021-08-27 DIAGNOSIS — Z79899 Other long term (current) drug therapy: Secondary | ICD-10-CM | POA: Diagnosis not present

## 2021-08-27 DIAGNOSIS — C770 Secondary and unspecified malignant neoplasm of lymph nodes of head, face and neck: Secondary | ICD-10-CM

## 2021-08-28 ENCOUNTER — Ambulatory Visit
Admission: RE | Admit: 2021-08-28 | Discharge: 2021-08-28 | Disposition: A | Discharge status: Home / Self Care | Payer: Medicare Other | Source: Ambulatory Visit | Attending: Otolaryngology | Admitting: Otolaryngology

## 2021-08-28 ENCOUNTER — Other Ambulatory Visit: Payer: Self-pay

## 2021-08-28 DIAGNOSIS — J90 Pleural effusion, not elsewhere classified: Secondary | ICD-10-CM | POA: Insufficient documentation

## 2021-08-28 DIAGNOSIS — I517 Cardiomegaly: Secondary | ICD-10-CM | POA: Insufficient documentation

## 2021-08-28 DIAGNOSIS — D7389 Other diseases of spleen: Secondary | ICD-10-CM | POA: Diagnosis not present

## 2021-08-28 DIAGNOSIS — R188 Other ascites: Secondary | ICD-10-CM | POA: Diagnosis not present

## 2021-08-28 DIAGNOSIS — I6523 Occlusion and stenosis of bilateral carotid arteries: Secondary | ICD-10-CM | POA: Insufficient documentation

## 2021-08-28 DIAGNOSIS — J439 Emphysema, unspecified: Secondary | ICD-10-CM | POA: Diagnosis not present

## 2021-08-28 DIAGNOSIS — N200 Calculus of kidney: Secondary | ICD-10-CM | POA: Insufficient documentation

## 2021-08-28 DIAGNOSIS — I700 Atherosclerosis of aorta: Secondary | ICD-10-CM | POA: Insufficient documentation

## 2021-08-28 DIAGNOSIS — C770 Secondary and unspecified malignant neoplasm of lymph nodes of head, face and neck: Secondary | ICD-10-CM | POA: Insufficient documentation

## 2021-08-28 DIAGNOSIS — C4442 Squamous cell carcinoma of skin of scalp and neck: Secondary | ICD-10-CM | POA: Diagnosis not present

## 2021-08-28 DIAGNOSIS — I2510 Atherosclerotic heart disease of native coronary artery without angina pectoris: Secondary | ICD-10-CM | POA: Diagnosis not present

## 2021-08-28 LAB — GLUCOSE, CAPILLARY: Glucose-Capillary: 97 mg/dL (ref 70–99)

## 2021-08-28 MED ORDER — FLUDEOXYGLUCOSE F - 18 (FDG) INJECTION
7.4000 | Freq: Once | INTRAVENOUS | Status: AC | PRN
  Administered 2021-08-28: 7.6 via INTRAVENOUS

## 2021-08-31 ENCOUNTER — Other Ambulatory Visit: Payer: Self-pay | Admitting: Internal Medicine

## 2021-09-01 ENCOUNTER — Inpatient Hospital Stay: Payer: Medicare Other | Admitting: Internal Medicine

## 2021-09-01 ENCOUNTER — Inpatient Hospital Stay: Payer: Medicare Other

## 2021-09-01 ENCOUNTER — Other Ambulatory Visit: Payer: Self-pay

## 2021-09-01 DIAGNOSIS — N183 Chronic kidney disease, stage 3 unspecified: Secondary | ICD-10-CM | POA: Diagnosis not present

## 2021-09-01 DIAGNOSIS — I130 Hypertensive heart and chronic kidney disease with heart failure and stage 1 through stage 4 chronic kidney disease, or unspecified chronic kidney disease: Secondary | ICD-10-CM | POA: Diagnosis not present

## 2021-09-01 DIAGNOSIS — C801 Malignant (primary) neoplasm, unspecified: Secondary | ICD-10-CM | POA: Diagnosis not present

## 2021-09-01 DIAGNOSIS — C770 Secondary and unspecified malignant neoplasm of lymph nodes of head, face and neck: Secondary | ICD-10-CM | POA: Diagnosis not present

## 2021-09-01 DIAGNOSIS — C7989 Secondary malignant neoplasm of other specified sites: Secondary | ICD-10-CM

## 2021-09-01 DIAGNOSIS — F17210 Nicotine dependence, cigarettes, uncomplicated: Secondary | ICD-10-CM | POA: Diagnosis not present

## 2021-09-01 DIAGNOSIS — Z79899 Other long term (current) drug therapy: Secondary | ICD-10-CM | POA: Diagnosis not present

## 2021-09-01 DIAGNOSIS — I2510 Atherosclerotic heart disease of native coronary artery without angina pectoris: Secondary | ICD-10-CM | POA: Diagnosis not present

## 2021-09-01 DIAGNOSIS — J449 Chronic obstructive pulmonary disease, unspecified: Secondary | ICD-10-CM | POA: Diagnosis not present

## 2021-09-01 LAB — CBC WITH DIFFERENTIAL/PLATELET
Abs Immature Granulocytes: 0.03 10*3/uL (ref 0.00–0.07)
Basophils Absolute: 0 10*3/uL (ref 0.0–0.1)
Basophils Relative: 1 %
Eosinophils Absolute: 0.1 10*3/uL (ref 0.0–0.5)
Eosinophils Relative: 1 %
HCT: 41.7 % (ref 39.0–52.0)
Hemoglobin: 13.6 g/dL (ref 13.0–17.0)
Immature Granulocytes: 1 %
Lymphocytes Relative: 11 %
Lymphs Abs: 0.7 10*3/uL (ref 0.7–4.0)
MCH: 28.6 pg (ref 26.0–34.0)
MCHC: 32.6 g/dL (ref 30.0–36.0)
MCV: 87.8 fL (ref 80.0–100.0)
Monocytes Absolute: 0.5 10*3/uL (ref 0.1–1.0)
Monocytes Relative: 9 %
Neutro Abs: 4.5 10*3/uL (ref 1.7–7.7)
Neutrophils Relative %: 77 %
Platelets: 227 10*3/uL (ref 150–400)
RBC: 4.75 MIL/uL (ref 4.22–5.81)
RDW: 16 % — ABNORMAL HIGH (ref 11.5–15.5)
WBC: 5.8 10*3/uL (ref 4.0–10.5)
nRBC: 0 % (ref 0.0–0.2)

## 2021-09-01 LAB — COMPREHENSIVE METABOLIC PANEL
ALT: 27 U/L (ref 0–44)
AST: 51 U/L — ABNORMAL HIGH (ref 15–41)
Albumin: 3.8 g/dL (ref 3.5–5.0)
Alkaline Phosphatase: 72 U/L (ref 38–126)
Anion gap: 9 (ref 5–15)
BUN: 44 mg/dL — ABNORMAL HIGH (ref 8–23)
CO2: 25 mmol/L (ref 22–32)
Calcium: 8.8 mg/dL — ABNORMAL LOW (ref 8.9–10.3)
Chloride: 97 mmol/L — ABNORMAL LOW (ref 98–111)
Creatinine, Ser: 1.3 mg/dL — ABNORMAL HIGH (ref 0.61–1.24)
GFR, Estimated: >60 mL/min (ref >60)
Glucose, Bld: 158 mg/dL — ABNORMAL HIGH (ref 70–99)
Potassium: 4.3 mmol/L (ref 3.5–5.1)
Sodium: 131 mmol/L — ABNORMAL LOW (ref 135–145)
Total Bilirubin: 2.6 mg/dL — ABNORMAL HIGH (ref 0.3–1.2)
Total Protein: 6.7 g/dL (ref 6.5–8.1)

## 2021-09-01 MED ORDER — PROCHLORPERAZINE MALEATE 10 MG PO TABS: 10.0000 mg | ORAL_TABLET | Freq: Four times a day (QID) | ORAL | 1 refills | Status: AC | PRN

## 2021-09-01 MED ORDER — LIDOCAINE-PRILOCAINE 2.5-2.5 % EX CREA: TOPICAL_CREAM | CUTANEOUS | 3 refills | Status: AC

## 2021-09-01 MED ORDER — ONDANSETRON HCL 8 MG PO TABS: ORAL_TABLET | ORAL | 1 refills | Status: AC

## 2021-09-01 NOTE — Progress Notes (Signed)
[name]  Patient Care Team: Perrin Maltese, MD as PCP - General (Internal Medicine)  CHIEF COMPLAINTS/PURPOSE OF CONSULTATION: head and neck cancer  #  Oncology History Overview Note  # DEC 2023- CT scan neck-Four abnormal level 5 lymph nodes on the left asThe largest 2 may have enlarged slightly since the ultrasound of 1 month ago. The differential diagnosis is that of reactive nodal enlargement versus malignant nodal disease. Consider either additional close clinical follow-up or fine-needle aspiration of the largest and most inferior level 5 node.  # s/p FNA- left neck LN- [dr.Bennett; FEB 2023]- DIAGNOSIS:  A. LYMPH NODE, LEFT CERVICAL; ULTRASOUND-GUIDED FINE NEEDLE ASPIRATION:  - DIAGNOSTIC OF MALIGNANCY.  - METASTATIC SQUAMOUS CELL CARCINOMA.   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)   09/01/2021 -  Chemotherapy   Patient is on Treatment Plan : carcinoma of unknow primary- Carboplatin / Paclitaxel + XRT q7d        HISTORY OF PRESENTING ILLNESS: Ambulating independently.  Accompanied by his wife.  [name] 66 y.o.  male smoker with left neck squamous cell carcinoma in situ with needles of the PET scan; further treatment options.  Patient continues to have a lump in the left neck.  Is not getting worse.  Patient denies any pain.  Denies any difficulty swallowing.  Weight loss attributed to depression with a new diagnosis.  Review of Systems  Constitutional:  Positive for malaise/fatigue and weight loss. Negative for chills, diaphoresis and fever.  HENT:  Negative for nosebleeds and sore throat.   Eyes:  Negative for double vision.  Respiratory:  Negative for cough, hemoptysis, sputum  production, shortness of breath and wheezing.   Cardiovascular:  Negative for chest pain, palpitations, orthopnea and leg swelling.  Gastrointestinal:  Negative for abdominal pain, blood in stool, constipation, diarrhea, heartburn, melena, nausea and vomiting.  Genitourinary:  Negative for dysuria, frequency and urgency.  Musculoskeletal:  Positive for back pain and joint pain.  Skin: Negative.  Negative for itching and rash.  Neurological:  Negative for dizziness, tingling, focal weakness, weakness and headaches.  Endo/Heme/Allergies:  Does not bruise/bleed easily.  Psychiatric/Behavioral:  Negative for depression. The patient is not nervous/anxious and does not have insomnia.     MEDICAL HISTORY:  Past Medical History:  Diagnosis Date   Anxiety    Arthritis    knees, lower back   Bronchitis    Cardiomyopathy ([place])    CHF (congestive heart failure) (HCC)    Chronic back pain    Complication of anesthesia    has trouble keeping pt asleep due to taking narcotics qid   Coronary artery disease    Diverticulitis    Hepatitis C virus infection without hepatic coma    received treatment. clear now   History of kidney stones    Hypertension     SURGICAL HISTORY: Past Surgical History:  Procedure Laterality Date   BACK SURGERY     CARDIAC CATHETERIZATION     CHOLECYSTECTOMY     COLON RESECTION  2007   due to diverticulitis   COLONOSCOPY WITH PROPOFOL N/A 03/29/2017   Procedure: COLONOSCOPY WITH PROPOFOL;  Surgeon: Lucilla Lame, MD;  Location: [name] ENDOSCOPY;  Service: Endoscopy;  Laterality: N/A;  CYSTOSCOPY/URETEROSCOPY/HOLMIUM LASER/STENT PLACEMENT Left 07/16/2019   Procedure: CYSTOSCOPY/URETEROSCOPY/HOLMIUM LASER/STENT Exchange;  Surgeon: Hollice Espy, MD;  Location: [facility] ORS;  Service: Urology;  Laterality: Left;   ESOPHAGOGASTRODUODENOSCOPY (EGD) WITH PROPOFOL N/A 03/29/2017   Procedure: ESOPHAGOGASTRODUODENOSCOPY (EGD) WITH PROPOFOL;  Surgeon: Lucilla Lame, MD;  Location:  [facility] ENDOSCOPY;  Service: Endoscopy;  Laterality: N/A;   FINGER SURGERY  2007   cut finger off with saw and was reattached   HERNIA REPAIR     HOLMIUM LASER APPLICATION N/A 38/[id number]/6433   Procedure: HOLMIUM LASER APPLICATION;  Surgeon: Hollice Espy, MD;  Location: [facility] ORS;  Service: Urology;  Laterality: N/A;   IR NEPHROSTOMY PLACEMENT LEFT  05/28/2019   KIDNEY SURGERY  2021   KNEE ARTHROSCOPY Bilateral    LOWER EXTREMITY ANGIOGRAPHY Right 11/09/2017   Procedure: LOWER EXTREMITY ANGIOGRAPHY;  Surgeon: Katha Cabal, MD;  Location: [place] CV LAB;  Service: Cardiovascular;  Laterality: Right;   LOWER EXTREMITY ANGIOGRAPHY Right 11/21/2018   Procedure: LOWER EXTREMITY ANGIOGRAPHY;  Surgeon: Katha Cabal, MD;  Location: [place] CV LAB;  Service: Cardiovascular;  Laterality: Right;   LOWER EXTREMITY ANGIOGRAPHY Right 09/18/2019   Procedure: LOWER EXTREMITY ANGIOGRAPHY;  Surgeon: Katha Cabal, MD;  Location: [place] CV LAB;  Service: Cardiovascular;  Laterality: Right;   NEPHROLITHOTOMY Left 05/28/2019   Procedure: NEPHROLITHOTOMY PERCUTANEOUS;  Surgeon: Hollice Espy, MD;  Location: [facility] ORS;  Service: Urology;  Laterality: Left;   SPINAL FUSION      SOCIAL HISTORY: Social History   Socioeconomic History   Marital status: Married    Spouse name: Vaughan Basta   Number of children: 12   Years of education: Not on file   Highest education level: Not on file  Occupational History   Occupation: disabled  Tobacco Use   Smoking status: Every Day    Packs/day: 0.50    Years: 25.00    Pack years: 12.50    Types: Cigarettes   Smokeless tobacco: Never  Vaping Use   Vaping Use: Never used  Substance and Sexual Activity   Alcohol use: No   Drug use: No   Sexual activity: Not Currently  Other Topics Concern   Not on file  Social History Narrative   2 great children living with pt. & wife; in [place]. Smoker 1/2ppd; no alcohol. 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    Social Determinants of Health   Financial Resource Strain: Not on file  Food Insecurity: Not on file  Transportation Needs: Not on file  Physical Activity: Not on file  Stress: Not on file  Social Connections: Not on file  Intimate Partner Violence: Not on file    FAMILY HISTORY: Family History  Problem Relation Age of Onset   COPD Mother     ALLERGIES:  is allergic to codeine and dilaudid [hydromorphone].  MEDICATIONS:  Current Outpatient Medications  Medication Sig Dispense Refill   albuterol (VENTOLIN HFA) 108 (90 Base) MCG/ACT inhaler Inhale 2 puffs into the lungs every 6 (six) hours as needed for wheezing or shortness of breath.     ALPRAZolam (XANAX) 1 MG tablet Take 1 mg by mouth 3 (three) times daily.  5   atorvastatin (LIPITOR) 80 MG tablet Take 1 tablet by mouth daily.     carvedilol (COREG) 25 MG tablet Take 25 mg by mouth 2 (two) times daily.      clopidogrel (PLAVIX) 75 MG tablet Take 1 tablet (75 mg total) by mouth daily. 30 tablet 11   ENTRESTO 49-51  MG Take 1 tablet by mouth 2 (two) times daily.     FARXIGA 10 MG TABS tablet Take 10 mg by mouth daily.     fluticasone (FLONASE) 50 MCG/ACT nasal spray Place 1 spray into both nostrils daily as needed for allergies.      furosemide (LASIX) 40 MG tablet Take 40 mg by mouth.     ipratropium-albuterol (DUONEB) 0.5-2.5 (3) MG/3ML SOLN Take 3 mLs by nebulization every 4 (four) hours as needed (for SOB). 360 mL 0   lidocaine-prilocaine (EMLA) cream Apply on the port. 30 -45 min  prior to port access. 30 g 3   ondansetron (ZOFRAN) 8 MG tablet One pill every 8 hours as needed for nausea/vomitting. 40 tablet 1   prochlorperazine (COMPAZINE) 10 MG tablet Take 1 tablet (10 mg total) by mouth every 6 (six) hours as needed for nausea or vomiting. 40 tablet 1   oxyCODONE (ROXICODONE) 15 MG immediate release tablet Take 10 mg by mouth 4 (four) times daily.     No current facility-administered medications for  this visit.     PHYSICAL EXAMINATION: ECOG PERFORMANCE STATUS: 1 - Symptomatic but completely ambulatory  Vitals:   09/01/21 1353  BP: 113/68  Pulse: 71  Resp: 16  Temp: (!) 96.3 F (35.7 C)  SpO2: 99%   Filed Weights   09/01/21 1353  Weight: 144 lb 6.4 oz (65.5 kg)   Left neck at least 3 lymph nodes felt; largest approximately 1.5 to 2 cm lower posterior.  Mobile nontender.  Physical Exam Vitals and nursing note reviewed.  HENT:     Head: Normocephalic and atraumatic.     Mouth/Throat:     Pharynx: Oropharynx is clear.  Eyes:     Extraocular Movements: Extraocular movements intact.     Pupils: Pupils are equal, round, and reactive to light.  Cardiovascular:     Rate and Rhythm: Normal rate and regular rhythm.  Pulmonary:     Comments: Decreased breath sounds bilaterally.  Abdominal:     Palpations: Abdomen is soft.  Musculoskeletal:        General: Normal range of motion.     Cervical back: Normal range of motion.  Skin:    General: Skin is warm.  Neurological:     General: No focal deficit present.     Mental Status: He is alert and oriented to person, place, and time.  Psychiatric:        Behavior: Behavior normal.        Judgment: Judgment normal.     LABORATORY DATA:  I have reviewed the data as listed Lab Results  Component Value Date   WBC 5.8 09/01/2021   HGB 13.6 09/01/2021   HCT 41.7 09/01/2021   MCV 87.8 09/01/2021   PLT 227 09/01/2021   Recent Labs    12/14/20 0613 12/15/20 0629 06/16/21 1540 09/01/21 1442  NA 129* 133*  --  131*  K 4.5 4.7  --  4.3  CL 92* 91*  --  97*  CO2 24 31  --  25  GLUCOSE 136* 152*  --  158*  BUN 28* 33*  --  44*  CREATININE 1.57* 1.58* 1.90* 1.30*  CALCIUM 9.0 8.9  --  8.8*  GFRNONAA 50* 49*  --  >60  PROT  --   --   --  6.7  ALBUMIN  --   --   --  3.8  AST  --   --   --  51*  ALT  --   --   --  27  ALKPHOS  --   --   --  72  BILITOT  --   --   --  2.6*    RADIOGRAPHIC STUDIES: I have  personally reviewed the radiological images as listed and agreed with the findings in the report. NM PET Image Initial (PI) Skull Base To Thigh (F-18 FDG)  Result Date: 08/28/2021 CLINICAL DATA:  Initial Treatment strategy for metastatic squamous cell carcinoma left cervical lymph node biopsy 08/14/2021. EXAM: NUCLEAR MEDICINE PET SKULL BASE TO THIGH TECHNIQUE: 7.6 mCi F-18 FDG was injected intravenously. Full-ring PET imaging was performed from the skull base to thigh after the radiotracer. CT data was obtained and used for attenuation correction and anatomic localization. Fasting blood glucose: 97 mg/dl COMPARISON:  CT neck 06/16/2021 and ultrasound 05/19/2021. Abdominopelvic CT 07/04/2019. FINDINGS: Mediastinal blood pool activity: SUV max 1.7 Liver activity: SUV max 3.2 NECK: There are 2 hypermetabolic cervical lymph nodes on the left. 0.9 cm level 2 node on image 43/2 has an SUV max of 4.0. 1.2 cm level 3 node on image 49/3 has an SUV max of 5.2. Additional smaller left cervical lymph node seen on CT are too small to optimally characterize but appear mildly hypermetabolic. There is no hypermetabolic right cervical adenopathy.There are no lesions of the pharyngeal mucosal space. Incidental CT findings: Bilateral carotid atherosclerosis. CHEST: There are no hypermetabolic mediastinal, hilar or axillary lymph nodes. No hypermetabolic pulmonary activity or suspicious nodularity. Incidental CT findings: There are prominent mediastinal lymph nodes including a 1.2 cm right paratracheal node on image 94/3 a 1.3 cm AP window node on image 94/3, without hypermetabolic activity. Atherosclerosis of the aorta, great vessels and coronary arteries. Mild cardiomegaly. There is a small right pleural effusion. Mild centrilobular and paraseptal emphysema. ABDOMEN/PELVIS: There is no hypermetabolic activity within the liver, adrenal glands, spleen or pancreas. There is no hypermetabolic nodal activity. Incidental CT findings:  Multiple calcified splenic granulomas. There is a cyst posteriorly in the mid right kidney and nonobstructing calculi in its lower pole. No evidence of ureteral calculus or hydronephrosis. A small amount of pelvic ascites is noted. There is diffuse aortic and branch vessel atherosclerosis. SKELETON: There is no hypermetabolic activity to suggest osseous metastatic disease. Incidental CT findings: Previous laminectomy and ray cage fusion at L5-S1. Bilateral gynecomastia. IMPRESSION: 1. Hypermetabolic left cervical lymph nodes consistent with nodal metastases. 2. No primary mucosal malignancy identified in the head or neck. No evidence of distant metastatic disease. 3. Incidental findings including probable reactive mediastinal lymph nodes, a small right pleural effusion, nonobstructing right renal calculi and sequela of prior granulomatous disease. A small amount of pelvic ascites is noted, of uncertain etiology. 4. Coronary and Aortic Atherosclerosis (ICD10-I70.0). Emphysema (ICD10-J43.9). Electronically Signed   By: Richardean Sale M.D.   On: 08/28/2021 13:40   [place] FNA SOFT TISSUE  Result Date: 08/14/2021 INDICATION: LT enlarged lymph node EXAM: Ultrasound-guided FNA biopsy of left cervical lymph node MEDICATIONS: None. ANESTHESIA/SEDATION: Local analgesia FLUOROSCOPY TIME:  N/a COMPLICATIONS: None immediate. PROCEDURE: Informed written consent was obtained from the patient after a thorough discussion of the procedural risks, benefits and alternatives. All questions were addressed. Maximal Sterile Barrier Technique was utilized including caps, mask, sterile gowns, sterile gloves, sterile drape, hand hygiene and skin antiseptic. A timeout was performed prior to the initiation of the procedure. The patient was placed supine on the exam table. Limited ultrasound of the left neck was performed, which again demonstrated  an enlarged left cervical lymph node. Skin entry site was marked, and the overlying skin was  prepped and draped in the standard sterile fashion. Local analgesia was obtained with 1% lidocaine. Under ultrasound guidance, fine-needle aspiration of the target left cervical lymph node was performed using 22 gauge needle x4 passes. Specimens were submitted to pathology technologist to ensure adequacy. Postprocedure imaging demonstrated no hematoma. A clean dressing was placed after manual hemostasis. The patient tolerated the procedure well without immediate complication. IMPRESSION: Successful ultrasound-guided fine-needle aspiration of enlarged left cervical lymph node. Electronically Signed   By: Albin Felling M.D.   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 PET scan- FEB 17th, 9622-  Hypermetabolic left cervical lymph nodes consistent with nodalccmetastases. No primary mucosal malignancy identified in the head or neck. No evidence of distant metastatic disease.  #Discussed the optionsurgery surgery-bilateral tonsillectomy/biopsies to evaluate for the primary.  However treatment would still be definitive chemoradiation therapy.  Discussed with Dr. Richardson Landry and Dr. Donella Stade.  We will also review at the tumor conference.  #Status post evaluation with Dr. Donella Stade. I would recommend generally weekly carbo-Taxol [patient a poor candidate for cisplatin GFR-40-50 ]/ along with radiation Monday through Friday.  I reviewed the labs from PCPs office.  # Will make referral to nutrition-given my concerns for weight loss/mucositis.  # Chemotherapy education; port placement. Hopefully the planned start chemotherapy next 2 weeks. Antiemetics-Zofran and Compazine; EMLA cream sent to pharmacy.   #Peripheral vascular disease-on Plavix/? CAD- on  lasix/Entresto/farxiga [Dr.Khan; card].  Clinically compensated at this time.  #CKD-stage III-GFR 40s-monitor closely on chemo  # COPD: trilegy; using- nebs prn.  Clinically stable.  # PN- 1-monitor closely on systemic therapy.  # Smoking:active smoker-recommend quitting smoking.  # #Incidental findings on Imaging PET scan FEB 2023: reactive mediastinal lymph nodes, a small right pleural effusion, nonobstructing right renal calculi and sequela of prior granulomatous disease; trace pelvic ascites is noted; Coronary and Aortic Atherosclerosis;  Emphysema;  I reviewed/discussed/counseled the patient.   # DISPOSITION: # labs today- cbc/cmp # IR referral for port placement- ASAP re: IV chemo access # chemo education- carbo-taxol weekly # referral to [facility] re: head and cancer # follow up in 2 weeks- MD; labs- cbc/cmp;carbo-Taxol; weekly # in 3 weeks-  labs- cbc/cmp;carbo-Taxol; weekly- Dr.B  Cc; PCP/Dr.Khan,cards  # I reviewed the blood work- with the patient in detail; also reviewed the imaging independently [as summarized above]; and with the patient in detail.         All questions were answered. The patient knows to call the clinic with any problems, questions or concerns.       Cammie Sickle, MD 09/01/2021 4:25 PM

## 2021-09-01 NOTE — Progress Notes (Signed)
START OFF PATHWAY REGIMEN - Other   [id number]:Carboplatin AUC=2 + Paclitaxel 45 mg/m2 Weekly:   Administer weekly:     Paclitaxel      Carboplatin   **Always confirm dose/schedule in your pharmacy ordering system**  Patient Characteristics: Intent of Therapy: Curative Intent, Discussed with Patient

## 2021-09-01 NOTE — Progress Notes (Signed)
Pt in for follow up, reports his appetite has improved .  Denies any difficulties today.

## 2021-09-01 NOTE — Assessment & Plan Note (Addendum)
#  Squamous cell carcinoma of unknown primary- [CT scan December 2022 neck-left-sided 5 lymph nodes-largest approximately 1 to 2 cm in size]; s/p FNA-positive for squamous cell carcinoma. PET scan- FEB 17th, 6754-  Hypermetabolic left cervical lymph nodes consistent with nodalccmetastases. No primary mucosal malignancy identified in the head or neck. No evidence of distant metastatic disease.  #Discussed the optionsurgery surgery-bilateral tonsillectomy/biopsies to evaluate for the primary.  However treatment would still be definitive chemoradiation therapy.  Discussed with Dr. Richardson Landry and Dr. Donella Stade.  We will also review at the tumor conference.  #Status post evaluation with Dr. Donella Stade. I would recommend generally weekly carbo-Taxol [patient a poor candidate for cisplatin GFR-40-50 ]/ along with radiation Monday through Friday.  I reviewed the labs from PCPs office.  # Will make referral to nutrition-given my concerns for weight loss/mucositis.  # Chemotherapy education; port placement. Hopefully the planned start chemotherapy next 2 weeks. Antiemetics-Zofran and Compazine; EMLA cream sent to pharmacy.   #Peripheral vascular disease-on Plavix/? CAD- on lasix/Entresto/farxiga [Dr.Khan; card].  Clinically compensated at this time.  #CKD-stage III-GFR 40s-monitor closely on chemo  # COPD: trilegy; using- nebs prn.  Clinically stable.  # PN- 1-monitor closely on systemic therapy.  # Smoking:active smoker-recommend quitting smoking.  # #Incidental findings on Imaging PET scan FEB 2023: reactive mediastinal lymph nodes, a small right pleural effusion, nonobstructing right renal calculi and sequela of prior granulomatous disease; trace pelvic ascites is noted; Coronary and Aortic Atherosclerosis;  Emphysema;  I reviewed/discussed/counseled the patient.   # DISPOSITION: # labs today- cbc/cmp # IR referral for port placement- ASAP re: IV chemo access # chemo education- carbo-taxol weekly #  referral to [facility] re: head and cancer # follow up in 2 weeks- MD; labs- cbc/cmp;carbo-Taxol; weekly # in 3 weeks-  labs- cbc/cmp;carbo-Taxol; weekly- Dr.B  Cc; PCP/Dr.Khan,cards  # I reviewed the blood work- with the patient in detail; also reviewed the imaging independently [as summarized above]; and with the patient in detail.

## 2021-09-02 ENCOUNTER — Telehealth: Payer: Self-pay

## 2021-09-02 ENCOUNTER — Ambulatory Visit: Payer: Medicare Other

## 2021-09-02 NOTE — Telephone Encounter (Signed)
Per Nancee Liter: [name] is scheduled for IR Port 09/09/21 @1 :30p  Arrive @12 :30p.  Spouse notified.

## 2021-09-03 ENCOUNTER — Other Ambulatory Visit: Payer: Medicare Other

## 2021-09-03 ENCOUNTER — Telehealth: Payer: Self-pay

## 2021-09-03 NOTE — Telephone Encounter (Signed)
Pt calling, asking for the results of his labs done yesterday. Please review.

## 2021-09-03 NOTE — Progress Notes (Signed)
Tumor Board Documentation  LYLE NIBLETT was presented by Dr Rogue Bussing at our Tumor Board on 09/03/2021, which included representatives from medical oncology, radiation oncology, radiology, pathology, surgical, pharmacy, research, navigation, internal medicine, palliative care.  Kyros currently presents as a new patient, for discussion with history of the following treatments: surgical intervention(s), active survellience.  Additionally, we reviewed previous medical and familial history, history of present illness, and recent lab results along with all available histopathologic and imaging studies. The tumor board considered available treatment options and made the following recommendations: Surgery, Radiation therapy (primary modality) (Left tonsilectomy withinspection of the tongue base and nasopharynx) Add P16 to cytology  The following procedures/referrals were also placed: No orders of the defined types were placed in this encounter.   Clinical Trial Status: not discussed   Staging used: To be determined AJCC Staging:       Group: Squamous cell Carcinoma with unknown primary   National site-specific guidelines   were discussed with respect to the case.  Tumor board is a meeting of clinicians from various specialty areas who evaluate and discuss patients for whom a multidisciplinary approach is being considered. Final determinations in the plan of care are those of the provider(s). The responsibility for follow up of recommendations given during tumor board is that of the provider.   Todays extended care, comprehensive team conference, Mccabe was not present for the discussion and was not examined.   Multidisciplinary Tumor Board is a multidisciplinary case peer review process.  Decisions discussed in the Multidisciplinary Tumor Board reflect the opinions of the specialists present at the conference without having examined the patient.  Ultimately, treatment and diagnostic  decisions rest with the primary provider(s) and the patient.

## 2021-09-03 NOTE — Progress Notes (Signed)
I spoke to patient regarding results of the bloodwork/discussion at tumor conference. Plan to proceed with tonsillectomy with Dr. Richardson Landry. Will review patient appointment at the cancer center based on surgical planning. patient for now recommended to keep appointments as planned.

## 2021-09-04 ENCOUNTER — Encounter: Payer: Self-pay | Admitting: Internal Medicine

## 2021-09-07 NOTE — Progress Notes (Signed)
Patient for port placement 09/09/2021, called and spoke with wife/Linda, made aware to be here @ 1230, NPO after 0630, along with driver post procedure/recovery/discharge. Stated understanding.

## 2021-09-08 ENCOUNTER — Inpatient Hospital Stay: Payer: Medicare Other

## 2021-09-08 ENCOUNTER — Other Ambulatory Visit: Payer: Self-pay | Admitting: Radiology

## 2021-09-08 ENCOUNTER — Other Ambulatory Visit: Payer: Self-pay

## 2021-09-08 NOTE — Progress Notes (Signed)
Pharmacist Chemotherapy Monitoring - Initial Assessment   ? ?Anticipated start date: 09/15/21  ? ?The following has been reviewed per standard work regarding the patient's treatment regimen: ?The patient's diagnosis, treatment plan and drug doses, and organ/hematologic function ?Lab orders and baseline tests specific to treatment regimen  ?The treatment plan start date, drug sequencing, and pre-medications ?Prior authorization status  ?Patient's documented medication list, including drug-drug interaction screen and prescriptions for anti-emetics and supportive care specific to the treatment regimen ?The drug concentrations, fluid compatibility, administration routes, and timing of the medications to be used ?The patient's access for treatment and lifetime cumulative dose history, if applicable  ?The patient's medication allergies and previous infusion related reactions, if applicable  ? ?Changes made to treatment plan:  ?treatment plan date ? ?Follow up needed:  ?dose adjustment of carbo due to no dose ? ? ?Adelina Mings, [facility], ?09/08/2021  9:03 AM  ?

## 2021-09-09 ENCOUNTER — Ambulatory Visit
Admission: RE | Admit: 2021-09-09 | Discharge: 2021-09-09 | Disposition: A | Discharge status: Home / Self Care | Payer: Medicare Other | Source: Ambulatory Visit | Attending: Internal Medicine | Admitting: Internal Medicine

## 2021-09-09 ENCOUNTER — Other Ambulatory Visit: Payer: Self-pay

## 2021-09-09 ENCOUNTER — Encounter: Payer: Self-pay | Admitting: Radiology

## 2021-09-09 ENCOUNTER — Telehealth: Payer: Self-pay | Admitting: *Deleted

## 2021-09-09 DIAGNOSIS — C801 Malignant (primary) neoplasm, unspecified: Secondary | ICD-10-CM | POA: Diagnosis not present

## 2021-09-09 DIAGNOSIS — I110 Hypertensive heart disease with heart failure: Secondary | ICD-10-CM | POA: Insufficient documentation

## 2021-09-09 DIAGNOSIS — C7989 Secondary malignant neoplasm of other specified sites: Secondary | ICD-10-CM

## 2021-09-09 DIAGNOSIS — C770 Secondary and unspecified malignant neoplasm of lymph nodes of head, face and neck: Secondary | ICD-10-CM | POA: Insufficient documentation

## 2021-09-09 DIAGNOSIS — I509 Heart failure, unspecified: Secondary | ICD-10-CM | POA: Diagnosis not present

## 2021-09-09 DIAGNOSIS — I429 Cardiomyopathy, unspecified: Secondary | ICD-10-CM | POA: Diagnosis not present

## 2021-09-09 DIAGNOSIS — F17210 Nicotine dependence, cigarettes, uncomplicated: Secondary | ICD-10-CM | POA: Diagnosis not present

## 2021-09-09 HISTORY — PX: IR IMAGING GUIDED PORT INSERTION: IMG5740

## 2021-09-09 LAB — CYTOLOGY - NON PAP

## 2021-09-09 MED ORDER — SODIUM CHLORIDE 0.9 % IV SOLN
INTRAVENOUS | Status: DC
  Filled 2021-09-09: qty 1000

## 2021-09-09 MED ORDER — FENTANYL CITRATE (PF) 100 MCG/2ML IJ SOLN
INTRAMUSCULAR | Status: AC
  Filled 2021-09-09: qty 2

## 2021-09-09 MED ORDER — LIDOCAINE-EPINEPHRINE 1 %-1:100000 IJ SOLN
INTRAMUSCULAR | Status: AC
  Administered 2021-09-09: 13 mL
  Filled 2021-09-09: qty 1

## 2021-09-09 MED ORDER — MIDAZOLAM HCL 2 MG/2ML IJ SOLN
INTRAMUSCULAR | Status: AC
  Filled 2021-09-09: qty 2

## 2021-09-09 MED ORDER — MIDAZOLAM HCL 2 MG/2ML IJ SOLN
INTRAMUSCULAR | Status: DC | PRN
  Administered 2021-09-09 (×2): 1 mg via INTRAVENOUS

## 2021-09-09 MED ORDER — HEPARIN SOD (PORK) LOCK FLUSH 100 UNIT/ML IV SOLN
INTRAVENOUS | Status: AC
  Administered 2021-09-09: 500 [IU]
  Filled 2021-09-09: qty 5

## 2021-09-09 NOTE — Telephone Encounter (Signed)
Wife called reporting that Dr Richardson Landry office has scheduled him to be seen in offiec 3/8 and surgery 3/15. She wants to be sure this plan is approved by Dr B before patient confirms surgery. Please return her call ?

## 2021-09-09 NOTE — Procedures (Signed)
Interventional Radiology Procedure Note  Procedure: Placement of a right IJ approach single lumen PowerPort.  Tip is positioned at the superior cavoatrial junction and catheter is ready for immediate use.  Complications: No immediate Recommendations:  - Ok to shower tomorrow - Do not submerge for 7 days - Routine line care   Signed,  Kent Riendeau K. Jeanita Carneiro, MD   

## 2021-09-09 NOTE — Progress Notes (Signed)
I spoke to patient?s wife regarding her concern for conflict of schedule with plan for surgery/radiation/chemotherapy.  ? ?It was discussed at the tumor conference to proceed with surgery to evaluate for the primary. Will discuss with radiation oncology regarding rescheduling of appointments in light of the surgical plan.  ? ?GB

## 2021-09-09 NOTE — H&P (Signed)
Chief Complaint: Patient was seen in consultation today for squamous cell carcinoma at the request of Brahmanday,Govinda R  Referring Physician(s): Cammie Sickle  Supervising Physician: Jacqulynn Cadet  Patient Status: [facility] - Out-pt  History of Present Illness: [name] is a 66 y.o. male with PMHx significant for squamous cell carcinoma with unknown primary who underwent a left cervical lymph node biopsy with [place] on 2/3 and is being seen by Oncology, last visit 2/21. The patient is here today for his scheduled elective port a catheter placement.   The patient has had a H&P performed within the last 30 days, all history, medications, and exam have been reviewed. The patient denies any interval changes since the H&P.  The patient denies any current chest pain or shortness of breath. The patient denies any recent infections, fever or chills. The patient denies any history of sleep apnea or chronic oxygen use. He has no known complications to sedation.    Past Medical History:  Diagnosis Date   Anxiety    Arthritis    knees, lower back   Bronchitis    Cardiomyopathy ([place])    CHF (congestive heart failure) (HCC)    Chronic back pain    Complication of anesthesia    has trouble keeping pt asleep due to taking narcotics qid   Coronary artery disease    Diverticulitis    Hepatitis C virus infection without hepatic coma    received treatment. clear now   History of kidney stones    Hypertension     Past Surgical History:  Procedure Laterality Date   BACK SURGERY     CARDIAC CATHETERIZATION     CHOLECYSTECTOMY     COLON RESECTION  2007   due to diverticulitis   COLONOSCOPY WITH PROPOFOL N/A 03/29/2017   Procedure: COLONOSCOPY WITH PROPOFOL;  Surgeon: Lucilla Lame, MD;  Location: [facility] ENDOSCOPY;  Service: Endoscopy;  Laterality: N/A;   CYSTOSCOPY/URETEROSCOPY/HOLMIUM LASER/STENT PLACEMENT Left 07/16/2019   Procedure: CYSTOSCOPY/URETEROSCOPY/HOLMIUM LASER/STENT  Exchange;  Surgeon: Hollice Espy, MD;  Location: [facility] ORS;  Service: Urology;  Laterality: Left;   ESOPHAGOGASTRODUODENOSCOPY (EGD) WITH PROPOFOL N/A 03/29/2017   Procedure: ESOPHAGOGASTRODUODENOSCOPY (EGD) WITH PROPOFOL;  Surgeon: Lucilla Lame, MD;  Location: [facility] ENDOSCOPY;  Service: Endoscopy;  Laterality: N/A;   FINGER SURGERY  2007   cut finger off with saw and was reattached   HERNIA REPAIR     HOLMIUM LASER APPLICATION N/A 96/10/5407   Procedure: HOLMIUM LASER APPLICATION;  Surgeon: Hollice Espy, MD;  Location: [facility] ORS;  Service: Urology;  Laterality: N/A;   IR NEPHROSTOMY PLACEMENT LEFT  05/28/2019   KIDNEY SURGERY  2021   KNEE ARTHROSCOPY Bilateral    LOWER EXTREMITY ANGIOGRAPHY Right 11/09/2017   Procedure: LOWER EXTREMITY ANGIOGRAPHY;  Surgeon: Katha Cabal, MD;  Location: [place] CV LAB;  Service: Cardiovascular;  Laterality: Right;   LOWER EXTREMITY ANGIOGRAPHY Right 11/21/2018   Procedure: LOWER EXTREMITY ANGIOGRAPHY;  Surgeon: Katha Cabal, MD;  Location: [place] [facility];  Service: Cardiovascular;  Laterality: Right;   LOWER EXTREMITY ANGIOGRAPHY Right 09/18/2019   Procedure: LOWER EXTREMITY ANGIOGRAPHY;  Surgeon: Katha Cabal, MD;  Location: [place] [facility];  Service: Cardiovascular;  Laterality: Right;   NEPHROLITHOTOMY Left 05/28/2019   Procedure: NEPHROLITHOTOMY PERCUTANEOUS;  Surgeon: Hollice Espy, MD;  Location: [facility] ORS;  Service: Urology;  Laterality: Left;   SPINAL FUSION      Allergies: Codeine and Dilaudid [hydromorphone]  Medications: Prior to Admission medications   Medication Sig  Start Date End Date Taking? Authorizing Provider  ALPRAZolam Duanne Moron) 1 MG tablet Take 1 mg by mouth 3 (three) times daily. 01/06/16  Yes [provider]  carvedilol (COREG) 25 MG tablet Take 25 mg by mouth 2 (two) times daily.    Yes [provider]  ENTRESTO 49-51 MG Take 1 tablet by mouth 2 (two) times daily. 10/14/20  Yes  [provider]  FARXIGA 10 MG TABS tablet Take 10 mg by mouth daily. 10/21/20  Yes [provider]  Fluticasone-Umeclidin-Vilant (TRELEGY ELLIPTA) 100-62.5-25 MCG/ACT AEPB Inhale into the lungs.   Yes [provider]  furosemide (LASIX) 40 MG tablet Take 40 mg by mouth.   Yes [provider]  oxyCODONE (ROXICODONE) 15 MG immediate release tablet Take 10 mg by mouth 4 (four) times daily. 05/07/19  Yes [provider]  albuterol (VENTOLIN HFA) 108 (90 Base) MCG/ACT inhaler Inhale 2 puffs into the lungs every 6 (six) hours as needed for wheezing or shortness of breath.    [provider]  atorvastatin (LIPITOR) 80 MG tablet Take 1 tablet by mouth daily. 11/22/20   [provider]  clopidogrel (PLAVIX) 75 MG tablet Take 1 tablet (75 mg total) by mouth daily. 11/09/17   Schnier, Dolores Lory, MD  fluticasone (FLONASE) 50 MCG/ACT nasal spray Place 1 spray into both nostrils daily as needed for allergies.  01/20/17   [provider]  ipratropium-albuterol (DUONEB) 0.5-2.5 (3) MG/3ML SOLN Take 3 mLs by nebulization every 4 (four) hours as needed (for SOB). 12/05/17   Vaughan Basta, MD  lidocaine-prilocaine (EMLA) cream Apply on the port. 30 -45 min  prior to port access. 09/01/21   Cammie Sickle, MD  ondansetron (ZOFRAN) 8 MG tablet One pill every 8 hours as needed for nausea/vomitting. 09/01/21   Cammie Sickle, MD  prochlorperazine (COMPAZINE) 10 MG tablet Take 1 tablet (10 mg total) by mouth every 6 (six) hours as needed for nausea or vomiting. 09/01/21   Cammie Sickle, MD     Family History  Problem Relation Age of Onset   COPD Mother     Social History   Socioeconomic History   Marital status: Married    Spouse name: Vaughan Basta   Number of children: 12   Years of education: Not on file   Highest education level: Not on file  Occupational History   Occupation: disabled  Tobacco Use   Smoking status:  Every Day    Packs/day: 0.50    Years: 25.00    Pack years: 12.50    Types: Cigarettes   Smokeless tobacco: Never  Vaping Use   Vaping Use: Never used  Substance and Sexual Activity   Alcohol use: No   Drug use: No   Sexual activity: Not Currently  Other Topics Concern   Not on file  Social History Narrative   2 great children living with pt. & wife; in [place]. Smoker 1/2ppd; no alcohol. 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    Social Determinants of Health   Financial Resource Strain: Not on file  Food Insecurity: Not on file  Transportation Needs: Not on file  Physical Activity: Not on file  Stress: Not on file  Social Connections: Not on file   Review of Systems: A 12 point ROS discussed and pertinent positives are indicated in the HPI above.  All other systems are negative.  Review of Systems  Vital Signs: BP 114/70    Temp 97.9 F (36.6 C) (Oral)  Resp 14    Ht 5\' 7"  (1.702 m)    Wt 144 lb (65.3 kg)    SpO2 98%    BMI 22.55 kg/m   Physical Exam Constitutional:      Appearance: Normal appearance.  Cardiovascular:     Rate and Rhythm: Normal rate and regular rhythm.  Pulmonary:     Effort: Pulmonary effort is normal.     Breath sounds: Normal breath sounds.  Skin:    General: Skin is warm and dry.  Neurological:     Mental Status: He is alert and oriented to person, place, and time.    Imaging: NM PET Image Initial (PI) Skull Base To Thigh (F-18 FDG)  Result Date: 08/28/2021 CLINICAL DATA:  Initial Treatment strategy for metastatic squamous cell carcinoma left cervical lymph node biopsy 08/14/2021. EXAM: NUCLEAR MEDICINE PET SKULL BASE TO THIGH TECHNIQUE: 7.6 mCi F-18 FDG was injected intravenously. Full-ring PET imaging was performed from the skull base to thigh after the radiotracer. CT data was obtained and used for attenuation correction and anatomic localization. Fasting blood glucose: 97 mg/dl COMPARISON:  CT neck 06/16/2021 and ultrasound  05/19/2021. Abdominopelvic CT 07/04/2019. FINDINGS: Mediastinal blood pool activity: SUV max 1.7 Liver activity: SUV max 3.2 NECK: There are 2 hypermetabolic cervical lymph nodes on the left. 0.9 cm level 2 node on image 43/2 has an SUV max of 4.0. 1.2 cm level 3 node on image 49/3 has an SUV max of 5.2. Additional smaller left cervical lymph node seen on CT are too small to optimally characterize but appear mildly hypermetabolic. There is no hypermetabolic right cervical adenopathy.There are no lesions of the pharyngeal mucosal space. Incidental CT findings: Bilateral carotid atherosclerosis. CHEST: There are no hypermetabolic mediastinal, hilar or axillary lymph nodes. No hypermetabolic pulmonary activity or suspicious nodularity. Incidental CT findings: There are prominent mediastinal lymph nodes including a 1.2 cm right paratracheal node on image 94/3 a 1.3 cm AP window node on image 94/3, without hypermetabolic activity. Atherosclerosis of the aorta, great vessels and coronary arteries. Mild cardiomegaly. There is a small right pleural effusion. Mild centrilobular and paraseptal emphysema. ABDOMEN/PELVIS: There is no hypermetabolic activity within the liver, adrenal glands, spleen or pancreas. There is no hypermetabolic nodal activity. Incidental CT findings: Multiple calcified splenic granulomas. There is a cyst posteriorly in the mid right kidney and nonobstructing calculi in its lower pole. No evidence of ureteral calculus or hydronephrosis. A small amount of pelvic ascites is noted. There is diffuse aortic and branch vessel atherosclerosis. SKELETON: There is no hypermetabolic activity to suggest osseous metastatic disease. Incidental CT findings: Previous laminectomy and ray cage fusion at L5-S1. Bilateral gynecomastia. IMPRESSION: 1. Hypermetabolic left cervical lymph nodes consistent with nodal metastases. 2. No primary mucosal malignancy identified in the head or neck. No evidence of distant metastatic  disease. 3. Incidental findings including probable reactive mediastinal lymph nodes, a small right pleural effusion, nonobstructing right renal calculi and sequela of prior granulomatous disease. A small amount of pelvic ascites is noted, of uncertain etiology. 4. Coronary and Aortic Atherosclerosis (ICD10-I70.0). Emphysema (ICD10-J43.9). Electronically Signed   By: Richardean Sale M.D.   On: 08/28/2021 13:40   [place] FNA SOFT TISSUE  Result Date: 08/14/2021 INDICATION: LT enlarged lymph node EXAM: Ultrasound-guided FNA biopsy of left cervical lymph node MEDICATIONS: None. ANESTHESIA/SEDATION: Local analgesia FLUOROSCOPY TIME:  N/a COMPLICATIONS: None immediate. PROCEDURE: Informed written consent was obtained from the patient after a thorough discussion of the procedural risks, benefits and alternatives. All questions were  addressed. Maximal Sterile Barrier Technique was utilized including caps, mask, sterile gowns, sterile gloves, sterile drape, hand hygiene and skin antiseptic. A timeout was performed prior to the initiation of the procedure. The patient was placed supine on the exam table. Limited ultrasound of the left neck was performed, which again demonstrated an enlarged left cervical lymph node. Skin entry site was marked, and the overlying skin was prepped and draped in the standard sterile fashion. Local analgesia was obtained with 1% lidocaine. Under ultrasound guidance, fine-needle aspiration of the target left cervical lymph node was performed using 22 gauge needle x4 passes. Specimens were submitted to pathology technologist to ensure adequacy. Postprocedure imaging demonstrated no hematoma. A clean dressing was placed after manual hemostasis. The patient tolerated the procedure well without immediate complication. IMPRESSION: Successful ultrasound-guided fine-needle aspiration of enlarged left cervical lymph node. Electronically Signed   By: Albin Felling M.D.   On: 08/14/2021 15:19     Labs:  CBC: Recent Labs    12/13/20 2353 12/14/20 0613 09/01/21 1442  WBC 6.3 8.6 5.8  HGB 12.6* 13.5 13.6  HCT 37.4* 41.0 41.7  PLT 281 286 227    COAGS: No results for input(s): INR, APTT in the last 8760 hours.  BMP: Recent Labs    12/13/20 2353 12/14/20 0613 12/15/20 0629 06/16/21 1540 09/01/21 1442  NA 128* 129* 133*  --  131*  K 4.7 4.5 4.7  --  4.3  CL 93* 92* 91*  --  97*  CO2 26 24 31   --  25  GLUCOSE 111* 136* 152*  --  158*  BUN 30* 28* 33*  --  44*  CALCIUM 8.7* 9.0 8.9  --  8.8*  CREATININE 1.53* 1.57* 1.58* 1.90* 1.30*  GFRNONAA 51* 50* 49*  --  >60    LIVER FUNCTION TESTS: Recent Labs    09/01/21 1442  BILITOT 2.6*  AST 51*  ALT 27  ALKPHOS 72  PROT 6.7  ALBUMIN 3.8    Assessment and Plan: This is a 66 year old male with PMHx significant for squamous cell carcinoma with unknown primary who underwent a left cervical lymph node biopsy with [place] on 2/3 and is being seen by Oncology, last visit 2/21. The patient is here today for his scheduled elective port a catheter placement.   The patient has been NPO, no changes since last medical visit, imaging, labs and vitals have been reviewed.  Risks and benefits of image guided port-a-catheter placement was discussed with the patient including, but not limited to bleeding, infection, pneumothorax, or fibrin sheath development and need for additional procedures.  All of the patient's questions were answered, patient is agreeable to proceed. Consent signed and in chart.   Thank you for this interesting consult.  I greatly enjoyed meeting GRISELDA Braun and look forward to participating in their care.  A copy of this report was sent to the requesting provider on this date.  Electronically Signed: Hedy Jacob, PA-C 09/09/2021, 12:59 PM   I spent a total of 15 Minutes in face to face in clinical consultation, greater than 50% of which was counseling/coordinating care for squamous cell  carcinoma.

## 2021-09-10 ENCOUNTER — Inpatient Hospital Stay: Payer: Medicare Other

## 2021-09-10 ENCOUNTER — Encounter: Payer: Self-pay | Admitting: Internal Medicine

## 2021-09-10 DIAGNOSIS — I130 Hypertensive heart and chronic kidney disease with heart failure and stage 1 through stage 4 chronic kidney disease, or unspecified chronic kidney disease: Secondary | ICD-10-CM | POA: Insufficient documentation

## 2021-09-10 DIAGNOSIS — I509 Heart failure, unspecified: Secondary | ICD-10-CM | POA: Insufficient documentation

## 2021-09-10 DIAGNOSIS — I2510 Atherosclerotic heart disease of native coronary artery without angina pectoris: Secondary | ICD-10-CM | POA: Insufficient documentation

## 2021-09-10 DIAGNOSIS — C7989 Secondary malignant neoplasm of other specified sites: Secondary | ICD-10-CM | POA: Insufficient documentation

## 2021-09-10 DIAGNOSIS — C801 Malignant (primary) neoplasm, unspecified: Secondary | ICD-10-CM | POA: Insufficient documentation

## 2021-09-10 DIAGNOSIS — F17210 Nicotine dependence, cigarettes, uncomplicated: Secondary | ICD-10-CM | POA: Insufficient documentation

## 2021-09-10 DIAGNOSIS — J449 Chronic obstructive pulmonary disease, unspecified: Secondary | ICD-10-CM | POA: Insufficient documentation

## 2021-09-10 DIAGNOSIS — N183 Chronic kidney disease, stage 3 unspecified: Secondary | ICD-10-CM | POA: Insufficient documentation

## 2021-09-10 DIAGNOSIS — Z5111 Encounter for antineoplastic chemotherapy: Secondary | ICD-10-CM | POA: Insufficient documentation

## 2021-09-10 NOTE — Progress Notes (Signed)
Nutrition Assessment ? ? ?Reason for Assessment:  ?Head and neck cancer ? ? ?ASSESSMENT:  ?66 year old male with left neck squamous cell carcinoma of unknown primary.  Past medical history of smoker, cardiomyopathy, CHF, hep C, CKD III, HTN, PVD, COPD.  Planning surgery, tonsillectomy and possible radiation/chemotherapy ? ?Met with patient and wife in clinic.  Patient reports that his appetite is good now that he knows what is going on.  Reports that for several months prior to diagnosis was not eating well.  Reports that he usually drinks 2 boost high protein (240 calories, 20 g protein) shake for breakfast.  Lunch is sandwich with chips and water.  Supper is meat and couple of sides. Wife prepares meals.  Snacks on nuts, corn chips, ice cream at night after supper.  Denies any pain or trouble swallowing.   ? ? ? ?Medications: lasix, zofran, compazine ? ? ?Labs: reviewed ? ? ?Anthropometrics:  ? ?Height: 67 inches ?Weight: 144 lb on 3/1 ?UBW: 150-155 lb per patient, few months ago ?Wife reports fluid removed in June 2022 ?BMI: 22 ? ?6% weight loss in the last few months per patient, concerning ? ?Estimated Energy Needs ? ?Kcals: 2620-3559 ?Protein: 98-113 g ?Fluid: 1.9 L ? ? ?NUTRITION DIAGNOSIS: Predicted sub optimal energy intake related to upcoming treatment ? ? ?INTERVENTION:  ?Discussed importance of nutrition leading up to surgery and during treatment. ?Discussed ways to add calories and protein in diet.  Handout provided ?Discussed soft, moist high protein foods. List of foods given ?Encouraged trying boost plus (360 calories/shake) for added calories. Coupons given ?Recipe book given to wife for shakes and smoothies per her request. ?Contact information given ? ? ?MONITORING, EVALUATION, GOAL: weight trends, intake ? ? ?Next Visit: to be determined with treatments ? ?Eulla Kochanowski B. Zenia Resides, RD, LDN ?Registered Dietitian ?[phone] (mobile) ? ? ? ? ? ?

## 2021-09-15 ENCOUNTER — Other Ambulatory Visit
Admission: RE | Admit: 2021-09-15 | Discharge: 2021-09-15 | Disposition: A | Discharge status: Home / Self Care | Payer: Medicare Other | Source: Ambulatory Visit | Attending: Otolaryngology | Admitting: Otolaryngology

## 2021-09-15 ENCOUNTER — Other Ambulatory Visit: Payer: Self-pay

## 2021-09-15 ENCOUNTER — Other Ambulatory Visit: Payer: Medicare Other

## 2021-09-15 ENCOUNTER — Encounter: Payer: Self-pay | Admitting: Internal Medicine

## 2021-09-15 ENCOUNTER — Ambulatory Visit: Payer: Medicare Other | Admitting: Internal Medicine

## 2021-09-15 ENCOUNTER — Ambulatory Visit: Payer: Medicare Other

## 2021-09-15 ENCOUNTER — Encounter: Payer: Self-pay | Admitting: Otolaryngology

## 2021-09-15 ENCOUNTER — Inpatient Hospital Stay (HOSPITAL_BASED_OUTPATIENT_CLINIC_OR_DEPARTMENT_OTHER): Payer: Medicare Other | Admitting: Internal Medicine

## 2021-09-15 DIAGNOSIS — C770 Secondary and unspecified malignant neoplasm of lymph nodes of head, face and neck: Secondary | ICD-10-CM | POA: Insufficient documentation

## 2021-09-15 DIAGNOSIS — C7989 Secondary malignant neoplasm of other specified sites: Secondary | ICD-10-CM

## 2021-09-15 DIAGNOSIS — I509 Heart failure, unspecified: Secondary | ICD-10-CM | POA: Diagnosis not present

## 2021-09-15 DIAGNOSIS — I2510 Atherosclerotic heart disease of native coronary artery without angina pectoris: Secondary | ICD-10-CM | POA: Insufficient documentation

## 2021-09-15 DIAGNOSIS — F17210 Nicotine dependence, cigarettes, uncomplicated: Secondary | ICD-10-CM | POA: Insufficient documentation

## 2021-09-15 DIAGNOSIS — I130 Hypertensive heart and chronic kidney disease with heart failure and stage 1 through stage 4 chronic kidney disease, or unspecified chronic kidney disease: Secondary | ICD-10-CM | POA: Insufficient documentation

## 2021-09-15 DIAGNOSIS — N183 Chronic kidney disease, stage 3 unspecified: Secondary | ICD-10-CM | POA: Insufficient documentation

## 2021-09-15 DIAGNOSIS — C801 Malignant (primary) neoplasm, unspecified: Secondary | ICD-10-CM

## 2021-09-15 DIAGNOSIS — I739 Peripheral vascular disease, unspecified: Secondary | ICD-10-CM | POA: Insufficient documentation

## 2021-09-15 DIAGNOSIS — J449 Chronic obstructive pulmonary disease, unspecified: Secondary | ICD-10-CM | POA: Insufficient documentation

## 2021-09-15 DIAGNOSIS — Z510 Encounter for antineoplastic radiation therapy: Secondary | ICD-10-CM | POA: Diagnosis present

## 2021-09-15 HISTORY — DX: Chronic obstructive pulmonary disease, unspecified: J44.9

## 2021-09-15 NOTE — Patient Instructions (Addendum)
?Your procedure is scheduled on: Wednesday September 23, 2021. ?Report to Day Surgery inside [facility] 2nd floor, stop by admissions desk before getting on elevator. ?To find out your arrival time please call [phone] between 1PM - 3PM on Tuesday September 22, 2021. ? ?Remember: Instructions that are not followed completely may result in serious medical risk,  ?up to and including death, or upon the discretion of your surgeon and anesthesiologist your  ?surgery may need to be rescheduled.  ? ?  _X__ 1. Do not eat food after midnight the night before your procedure. ?                No chewing gum or hard candies. You may drink clear liquids up to 2 hours ?                before you are scheduled to arrive for your surgery- DO not drink clear ?                liquids within 2 hours of the start of your surgery. ?                Clear Liquids include:  water, Black Coffee or Tea (Do not add ?                anything to coffee or tea). ? ?__X__2.  On the morning of surgery brush your teeth with toothpaste and water, you ?               may rinse your mouth with mouthwash if you wish.  Do not swallow any toothpaste or mouthwash. ?   ? _X__ 3.  No Alcohol for 24 hours before or after surgery. ? ? _X__ 4.  Do Not Smoke or use e-cigarettes For 24 Hours Prior to Your Surgery. ?                Do not use any chewable tobacco products for at least 6 hours prior to ?                Surgery. ? ?_X__  5.  Do not use any recreational drugs (marijuana, cocaine, heroin, ecstasy, MDMA or other) ?               For at least one week prior to your surgery.  Combination of these drugs with anesthesia ?               May have life threatening results. ? ?____  6.  Bring all medications with you on the day of surgery if instructed.  ? ?__X__  7.  Notify your doctor if there is any change in your medical condition  ?    (cold, fever, infections). ?    ?Do not wear jewelry, make-up, hairpins, clips or nail  polish. ?Do not wear lotions, powders, or perfumes. You may wear deodorant. ?Do not shave 48 hours prior to surgery. Men may shave face and neck. ?Do not bring valuables to the hospital.   ? ?[facility] is not responsible for any belongings or valuables. ? ?Contacts, dentures or bridgework may not be worn into surgery. ?Leave your suitcase in the car. After surgery it may be brought to your room. ?For patients admitted to the hospital, discharge time is determined by your ?treatment team. ?  ?Patients discharged the day of surgery will not be allowed to drive home.   ?Make arrangements for someone to be with you  for the first 24 hours of your ?Same Day Discharge. ? ? ? ?__X__ Take these medicines the morning of surgery with A SIP OF WATER:  ? ? 1. carvedilol (COREG) 25 MG ? 2.  ? 3.  ? 4. ? 5. ? 6. ? ?____ Fleet Enema (as directed)  ? ?__X__ Use CHG Soap (or wipes) as directed ? ?____ Use Benzoyl Peroxide Gel as instructed ? ?__X__ Use inhalers on the day of surgery ? albuterol (VENTOLIN HFA) 108 (90 Base) MCG/ACT inhaler ? Fluticasone-Umeclidin-Vilant (TRELEGY ELLIPTA) 100-62.5-25 MCG/ACT AEPB ? ?__X__ Stop  FARXIGA 10 MG 3 days prior to surgery   ? ?____ Take 1/2 of usual insulin dose the night before surgery. No insulin the morning ?         of surgery.  ? ?__X__ per your cardiologist, stop clopidogrel (PLAVIX) 75 MG preop & 7 days after your surgery.  ? ?__X__ One Week prior to surgery- Stop Anti-inflammatories such as Ibuprofen, Aleve, Advil, Motrin, meloxicam (MOBIC), diclofenac, etodolac, ketorolac, Toradol, Daypro, piroxicam, Goody's or BC powders. OK TO USE TYLENOL IF NEEDED ?  ?__X__ Stop supplements until after surgery.   ? ?____ Bring C-Pap to the hospital.  ? ? ?If you have any questions regarding your pre-procedure instructions,  ?Please call Pre-admit Testing at [phone] ?

## 2021-09-15 NOTE — Assessment & Plan Note (Signed)
#  Squamous cell carcinoma of unknown primary- [CT scan December 2022 neck-left-sided 5 lymph nodes-largest approximately 1 to 2 cm in size]; s/p FNA-positive for squamous cell carcinoma. PET scan- FEB 17th, 6160-  Hypermetabolic left cervical lymph nodes consistent with nodalccmetastases. No primary mucosal malignancy identified in the head or neck. No evidence of distant metastatic disease. ? ?#Discussed at tumor conference-recommend bilateral tonsillectomy/biopsies to evaluate the primary to help narrow to the field of radiation.  Explained to the patient again in detail.  He finally understands the rationale.  Spoke to the wife also over the phone.  Awaiting tonsillectomy on March 15.  ? ?#Recommend weekly carbo-Taxol [patient a poor candidate for cisplatin GFR-40-50 ]/ along with radiation Monday through Friday.  ? ?#Peripheral vascular disease-on Plavix/? CAD- on lasix/Entresto/farxiga [Dr.Khan; card].  Clinically compensated at this time. ? ?#CKD-stage III-GFR 40s-monitor closely on chemo ? ?# COPD: trilegy; using- nebs prn.  Clinically stable. ? ?# PN- 1-monitor closely on systemic therapy. ? ?# Smoking:active smoker-recommend quitting smoking.  ? ?# DISPOSITION: ?# follow up as planned on 3/23-MD;   labs- cbc/cmp;carbo-Taxol; weekly- Dr.B ? ?Cc; PCP/Dr.Khan,cards ? ? ?

## 2021-09-15 NOTE — Progress Notes (Signed)
[facility] NOTE  Patient Care Team: Perrin Maltese, MD as PCP - General (Internal Medicine) Cammie Sickle, MD as Consulting Physician (Hematology and Oncology)  CHIEF COMPLAINTS/PURPOSE OF CONSULTATION: head and neck cancer  #  Oncology History Overview Note  # DEC 2023- CT scan neck-Four abnormal level 5 lymph nodes on the left asThe largest 2 may have enlarged slightly since the ultrasound of 1 month ago. The differential diagnosis is that of reactive nodal enlargement versus malignant nodal disease. Consider either additional close clinical follow-up or fine-needle aspiration of the largest and most inferior level 5 node.  # s/p FNA- left neck LN- [dr.Bennett; FEB 2023]- DIAGNOSIS:  A. LYMPH NODE, LEFT CERVICAL; ULTRASOUND-GUIDED FINE NEEDLE ASPIRATION:  - DIAGNOSTIC OF MALIGNANCY.  - METASTATIC SQUAMOUS CELL CARCINOMA.   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  Alone.   [name] 66 y.o.  male smoker with left neck squamous cell carcinoma -of unknown primary is here for further discussion regarding his plan of care.  Patient's case was discussed at tumor conference-recommended bilateral tonsillectomy; to help narrow down the field of radiation.  Patient/wife called after appointment regarding the change in plan.  However patient upset and feels that his plan of care is delayed unnecessarily.   Patient continues to have a lump in the left neck.  Is not getting worse.  Patient denies any pain.  Denies any  difficulty swallowing.  Weight loss attributed to depression with a new diagnosis.  Review of Systems  Constitutional:  Positive for malaise/fatigue and weight loss. Negative for chills, diaphoresis and fever.  HENT:  Negative for nosebleeds and sore throat.   Eyes:  Negative for double vision.  Respiratory:  Negative for cough, hemoptysis, sputum production, shortness of breath and wheezing.   Cardiovascular:  Negative for chest pain, palpitations, orthopnea and leg swelling.  Gastrointestinal:  Negative for abdominal pain, blood in stool, constipation, diarrhea, heartburn, melena, nausea and vomiting.  Genitourinary:  Negative for dysuria, frequency and urgency.  Musculoskeletal:  Positive for back pain and joint pain.  Skin: Negative.  Negative for itching and rash.  Neurological:  Negative for dizziness, tingling, focal weakness, weakness and headaches.  Endo/Heme/Allergies:  Does not bruise/bleed easily.  Psychiatric/Behavioral:  Negative for depression. The patient is not nervous/anxious and does not have insomnia.     MEDICAL HISTORY:  Past Medical History:  Diagnosis Date   Anxiety    Arthritis    knees, lower back   Bronchitis    Cardiomyopathy ([place])    CHF (congestive heart failure) (HCC)    Chronic back pain    Complication of anesthesia    has trouble keeping pt asleep due to taking narcotics qid   Coronary artery disease    Diverticulitis    Hepatitis C virus infection without hepatic coma    received treatment. clear now   History of kidney stones    Hypertension     SURGICAL HISTORY: Past Surgical History:  Procedure Laterality Date   BACK SURGERY  CARDIAC CATHETERIZATION     CHOLECYSTECTOMY     COLON RESECTION  2007   due to diverticulitis   COLONOSCOPY WITH PROPOFOL N/A 03/29/2017   Procedure: COLONOSCOPY WITH PROPOFOL;  Surgeon: Lucilla Lame, MD;  Location: [facility] ENDOSCOPY;  Service: Endoscopy;  Laterality: N/A;   CYSTOSCOPY/URETEROSCOPY/HOLMIUM  LASER/STENT PLACEMENT Left 07/16/2019   Procedure: CYSTOSCOPY/URETEROSCOPY/HOLMIUM LASER/STENT Exchange;  Surgeon: Hollice Espy, MD;  Location: [facility] ORS;  Service: Urology;  Laterality: Left;   ESOPHAGOGASTRODUODENOSCOPY (EGD) WITH PROPOFOL N/A 03/29/2017   Procedure: ESOPHAGOGASTRODUODENOSCOPY (EGD) WITH PROPOFOL;  Surgeon: Lucilla Lame, MD;  Location: [facility] ENDOSCOPY;  Service: Endoscopy;  Laterality: N/A;   FINGER SURGERY  2007   cut finger off with saw and was reattached   HERNIA REPAIR     HOLMIUM LASER APPLICATION N/A 08/67/6195   Procedure: HOLMIUM LASER APPLICATION;  Surgeon: Hollice Espy, MD;  Location: [facility] ORS;  Service: Urology;  Laterality: N/A;   IR IMAGING GUIDED PORT INSERTION  09/09/2021   IR NEPHROSTOMY PLACEMENT LEFT  05/28/2019   KIDNEY SURGERY  2021   KNEE ARTHROSCOPY Bilateral    LOWER EXTREMITY ANGIOGRAPHY Right 11/09/2017   Procedure: LOWER EXTREMITY ANGIOGRAPHY;  Surgeon: Katha Cabal, MD;  Location: [place] CV LAB;  Service: Cardiovascular;  Laterality: Right;   LOWER EXTREMITY ANGIOGRAPHY Right 11/21/2018   Procedure: LOWER EXTREMITY ANGIOGRAPHY;  Surgeon: Katha Cabal, MD;  Location: [place] CV LAB;  Service: Cardiovascular;  Laterality: Right;   LOWER EXTREMITY ANGIOGRAPHY Right 09/18/2019   Procedure: LOWER EXTREMITY ANGIOGRAPHY;  Surgeon: Katha Cabal, MD;  Location: [place] CV LAB;  Service: Cardiovascular;  Laterality: Right;   NEPHROLITHOTOMY Left 05/28/2019   Procedure: NEPHROLITHOTOMY PERCUTANEOUS;  Surgeon: Hollice Espy, MD;  Location: [facility] ORS;  Service: Urology;  Laterality: Left;   SPINAL FUSION      SOCIAL HISTORY: Social History   Socioeconomic History   Marital status: Married    Spouse name: Vaughan Basta   Number of children: 12   Years of education: Not on file   Highest education level: Not on file  Occupational History   Occupation: disabled  Tobacco Use   Smoking status: Every Day    Packs/day: 0.50     Years: 25.00    Pack years: 12.50    Types: Cigarettes   Smokeless tobacco: Never  Vaping Use   Vaping Use: Never used  Substance and Sexual Activity   Alcohol use: No   Drug use: No   Sexual activity: Not Currently  Other Topics Concern   Not on file  Social History Narrative   2 great children living with pt. & wife; in [place]. Smoker 1/2ppd; no alcohol. 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    Social Determinants of Health   Financial Resource Strain: Not on file  Food Insecurity: Not on file  Transportation Needs: Not on file  Physical Activity: Not on file  Stress: Not on file  Social Connections: Not on file  Intimate Partner Violence: Not on file    FAMILY HISTORY: Family History  Problem Relation Age of Onset   COPD Mother     ALLERGIES:  is allergic to codeine and dilaudid [hydromorphone].  MEDICATIONS:  Current Outpatient Medications  Medication Sig Dispense Refill   albuterol (VENTOLIN HFA) 108 (90 Base) MCG/ACT inhaler Inhale 2 puffs into the lungs every 6 (six) hours as needed for wheezing or shortness of breath.     ALPRAZolam (XANAX) 1 MG tablet Take 1 mg by mouth 3 (three) times daily.  5   atorvastatin (LIPITOR) 80 MG tablet Take 1 tablet by mouth daily.     carvedilol (COREG) 25 MG tablet Take 25 mg by mouth 2 (two) times daily.      clopidogrel (PLAVIX) 75 MG tablet Take 1 tablet (75 mg total) by mouth daily. 30 tablet 11   ENTRESTO 49-51 MG Take 1 tablet by mouth 2 (two) times daily.     FARXIGA 10 MG TABS tablet Take 10 mg by mouth daily.     fluticasone (FLONASE) 50 MCG/ACT nasal spray Place 1 spray into both nostrils daily as needed for allergies.      Fluticasone-Umeclidin-Vilant (TRELEGY ELLIPTA) 100-62.5-25 MCG/ACT AEPB Inhale into the lungs.     furosemide (LASIX) 40 MG tablet Take 40 mg by mouth.     ipratropium-albuterol (DUONEB) 0.5-2.5 (3) MG/3ML SOLN Take 3 mLs by nebulization every 4 (four) hours as needed (for SOB). 360 mL 0    lidocaine-prilocaine (EMLA) cream Apply on the port. 30 -45 min  prior to port access. 30 g 3   ondansetron (ZOFRAN) 8 MG tablet One pill every 8 hours as needed for nausea/vomitting. 40 tablet 1   oxyCODONE (ROXICODONE) 15 MG immediate release tablet Take 10 mg by mouth 4 (four) times daily.     prochlorperazine (COMPAZINE) 10 MG tablet Take 1 tablet (10 mg total) by mouth every 6 (six) hours as needed for nausea or vomiting. 40 tablet 1   No current facility-administered medications for this visit.     PHYSICAL EXAMINATION: ECOG PERFORMANCE STATUS: 1 - Symptomatic but completely ambulatory  Vitals:   09/15/21 0833  BP: 118/72  Pulse: 83  Resp: 16  Temp: (!) 97.5 F (36.4 C)  SpO2: 96%   Filed Weights   09/15/21 0833  Weight: 148 lb (67.1 kg)   Left neck at least 3 lymph nodes felt; largest approximately 1.5 to 2 cm lower posterior.  Mobile nontender.  Physical Exam Vitals and nursing note reviewed.  HENT:     Head: Normocephalic and atraumatic.     Mouth/Throat:     Pharynx: Oropharynx is clear.  Eyes:     Extraocular Movements: Extraocular movements intact.     Pupils: Pupils are equal, round, and reactive to light.  Cardiovascular:     Rate and Rhythm: Normal rate and regular rhythm.  Pulmonary:     Comments: Decreased breath sounds bilaterally.  Abdominal:     Palpations: Abdomen is soft.  Musculoskeletal:        General: Normal range of motion.     Cervical back: Normal range of motion.  Skin:    General: Skin is warm.  Neurological:     General: No focal deficit present.     Mental Status: He is alert and oriented to person, place, and time.  Psychiatric:        Behavior: Behavior normal.        Judgment: Judgment normal.     LABORATORY DATA:  I have reviewed the data as listed Lab Results  Component Value Date   WBC 5.8 09/01/2021   HGB 13.6 09/01/2021   HCT 41.7 09/01/2021   MCV 87.8 09/01/2021   PLT 227 09/01/2021   Recent Labs     12/14/20 0613 12/15/20 0629 06/16/21 1540 09/01/21 1442  NA 129* 133*  --  131*  K 4.5 4.7  --  4.3  CL 92* 91*  --  97*  CO2 24 31  --  25  GLUCOSE 136* 152*  --  158*  BUN 28* 33*  --  44*  CREATININE 1.57* 1.58* 1.90* 1.30*  CALCIUM 9.0 8.9  --  8.8*  GFRNONAA 50* 49*  --  >60  PROT  --   --   --  6.7  ALBUMIN  --   --   --  3.8  AST  --   --   --  51*  ALT  --   --   --  27  ALKPHOS  --   --   --  72  BILITOT  --   --   --  2.6*    RADIOGRAPHIC STUDIES: I have personally reviewed the radiological images as listed and agreed with the findings in the report. NM PET Image Initial (PI) Skull Base To Thigh (F-18 FDG)  Result Date: 08/28/2021 CLINICAL DATA:  Initial Treatment strategy for metastatic squamous cell carcinoma left cervical lymph node biopsy 08/14/2021. EXAM: NUCLEAR MEDICINE PET SKULL BASE TO THIGH TECHNIQUE: 7.6 mCi F-18 FDG was injected intravenously. Full-ring PET imaging was performed from the skull base to thigh after the radiotracer. CT data was obtained and used for attenuation correction and anatomic localization. Fasting blood glucose: 97 mg/dl COMPARISON:  CT neck 06/16/2021 and ultrasound 05/19/2021. Abdominopelvic CT 07/04/2019. FINDINGS: Mediastinal blood pool activity: SUV max 1.7 Liver activity: SUV max 3.2 NECK: There are 2 hypermetabolic cervical lymph nodes on the left. 0.9 cm level 2 node on image 43/2 has an SUV max of 4.0. 1.2 cm level 3 node on image 49/3 has an SUV max of 5.2. Additional smaller left cervical lymph node seen on CT are too small to optimally characterize but appear mildly hypermetabolic. There is no hypermetabolic right cervical adenopathy.There are no lesions of the pharyngeal mucosal space. Incidental CT findings: Bilateral carotid atherosclerosis. CHEST: There are no hypermetabolic mediastinal, hilar or axillary lymph nodes. No hypermetabolic pulmonary activity or suspicious nodularity. Incidental CT findings: There are prominent  mediastinal lymph nodes including a 1.2 cm right paratracheal node on image 94/3 a 1.3 cm AP window node on image 94/3, without hypermetabolic activity. Atherosclerosis of the aorta, great vessels and coronary arteries. Mild cardiomegaly. There is a small right pleural effusion. Mild centrilobular and paraseptal emphysema. ABDOMEN/PELVIS: There is no hypermetabolic activity within the liver, adrenal glands, spleen or pancreas. There is no hypermetabolic nodal activity. Incidental CT findings: Multiple calcified splenic granulomas. There is a cyst posteriorly in the mid right kidney and nonobstructing calculi in its lower pole. No evidence of ureteral calculus or hydronephrosis. A small amount of pelvic ascites is noted. There is diffuse aortic and branch vessel atherosclerosis. SKELETON: There is no hypermetabolic activity to suggest osseous metastatic disease. Incidental CT findings: Previous laminectomy and ray cage fusion at L5-S1. Bilateral gynecomastia. IMPRESSION: 1. Hypermetabolic left cervical lymph nodes consistent with nodal metastases. 2. No primary mucosal malignancy identified in the head or neck. No evidence of distant metastatic disease. 3. Incidental findings including probable reactive mediastinal lymph nodes, a small right pleural effusion, nonobstructing right renal calculi and sequela of prior granulomatous disease. A small amount of pelvic ascites is noted, of uncertain etiology. 4. Coronary and Aortic Atherosclerosis (ICD10-I70.0). Emphysema (ICD10-J43.9). Electronically Signed   By: Richardean Sale M.D.   On: 08/28/2021 13:40   IR IMAGING GUIDED PORT INSERTION  Result Date: 09/09/2021 INDICATION: 66 year old male with lymphadenopathy and biopsy-proven metastatic squamous cell carcinoma of uncertain origin. He presents for port catheter placement to facilitate chemotherapy. EXAM: IMPLANTED PORT A CATH PLACEMENT WITH ULTRASOUND AND FLUOROSCOPIC  GUIDANCE MEDICATIONS: None ANESTHESIA/SEDATION:  Versed 2 mg IV; Fentanyl 100 mcg IV; Moderate Sedation Time:  17 minutes The patient's vital signs and level of consciousness were continuously monitored during the procedure by the interventional radiology nurse under my direct supervision. FLUOROSCOPY: 3.5 mGy, air kerma COMPLICATIONS: None immediate. PROCEDURE: The right neck and chest was prepped with chlorhexidine, and draped in the usual sterile fashion using maximum barrier technique (cap and mask, sterile gown, sterile gloves, large sterile sheet, hand hygiene and cutaneous antiseptic). Local anesthesia was attained by infiltration with 1% lidocaine with epinephrine. Ultrasound demonstrated patency of the right internal jugular vein, and this was documented with an image. Under real-time ultrasound guidance, this vein was accessed with a 21 gauge micropuncture needle and image documentation was performed. A small dermatotomy was made at the access site with an 11 scalpel. A 0.018" wire was advanced into the SVC and the access needle exchanged for a 16F micropuncture vascular sheath. The 0.018" wire was then removed and a 0.035" wire advanced into the IVC. An appropriate location for the subcutaneous reservoir was selected below the clavicle and an incision was made through the skin and underlying soft tissues. The subcutaneous tissues were then dissected using a combination of blunt and sharp surgical technique and a pocket was formed. A single lumen power injectable portacatheter was then tunneled through the subcutaneous tissues from the pocket to the dermatotomy and the port reservoir placed within the subcutaneous pocket. The venous access site was then serially dilated and a peel away vascular sheath placed over the wire. The wire was removed and the port catheter advanced into position under fluoroscopic guidance. The catheter tip is positioned in the superior cavoatrial junction. This was documented with a spot image. The portacatheter was then tested  and found to flush and aspirate well. The port was flushed with saline followed by 100 units/mL heparinized saline. The pocket was then closed in two layers using first subdermal inverted interrupted absorbable sutures followed by a running subcuticular suture. The epidermis was then sealed with Dermabond. The dermatotomy at the venous access site was also closed with Dermabond. IMPRESSION: Successful placement of a right IJ approach Power Port with ultrasound and fluoroscopic guidance. The catheter is ready for use. Electronically Signed   By: Jacqulynn Cadet M.D.   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 PET scan- FEB 17th, 6578-  Hypermetabolic left cervical lymph nodes consistent with nodalccmetastases. No primary mucosal malignancy identified in the head or neck. No evidence of distant metastatic disease.  #Discussed at tumor conference-recommend bilateral tonsillectomy/biopsies to evaluate the primary to help narrow to the field of radiation.  Explained to the patient again in detail.  He finally understands the rationale.  Spoke to the wife also over the phone.  Awaiting tonsillectomy on March 15.   #Recommend weekly carbo-Taxol [patient a poor candidate for cisplatin GFR-40-50 ]/ along with radiation Monday through Friday.   #Peripheral vascular disease-on Plavix/? CAD- on lasix/Entresto/farxiga [Dr.Khan; card].  Clinically compensated at this time.  #CKD-stage III-GFR 40s-monitor closely on chemo  # COPD: trilegy; using- nebs prn.  Clinically stable.  # PN- 1-monitor closely on systemic therapy.  # Smoking:active smoker-recommend quitting smoking.   # DISPOSITION: # follow up as  planned on 3/23-MD;   labs-  cbc/cmp;carbo-Taxol; weekly- Dr.B  Cc; PCP/Dr.Khan,cards      All questions were answered. The patient knows to call the clinic with any problems, questions or concerns.       Cammie Sickle, MD 09/15/2021 9:22 AM

## 2021-09-16 ENCOUNTER — Encounter: Payer: Self-pay | Admitting: Otolaryngology

## 2021-09-16 NOTE — Progress Notes (Signed)
Perioperative Services  Pre-Admission/Anesthesia Testing Clinical Review  Date: 09/21/21  Patient Demographics:  Name: [name] DOB:   03-24-1956 MRN:   [id number]  Planned Surgical Procedure(s):    Case: [id number] Date/Time: 09/23/21 0815   Procedures:      DIRECT LARYNGOSCOPY WITH BIOPSIES (Bilateral)     TONSILLECTOMY AND ADENOIDECTOMY (Left)   Anesthesia type: General   Pre-op diagnosis: Concerning lymph nodes, locate source of cancer spread   Location: [facility] OR ROOM 09 / [place] [facility]   Surgeons: Clyde Canterbury, MD   NOTE: Available PAT nursing documentation and vital signs have been reviewed. Clinical nursing staff has updated patient's PMH/PSHx, current medication list, and drug allergies/intolerances to ensure comprehensive history available to assist in medical decision making as it pertains to the aforementioned surgical procedure and anticipated anesthetic course. Extensive review of available clinical information performed. [place] PMH and PSHx updated with any diagnoses/procedures that  may have been inadvertently omitted during his intake with the pre-admission testing department's nursing staff.  Clinical Discussion:  [name] is a 66 y.o. male who is submitted for pre-surgical anesthesia review and clearance prior to him undergoing the above procedure. Patient is a Current Smoker (12.5 pack years). 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  Patient is followed by cardiology Humphrey Rolls, MD). He was last seen in the cardiology clinic on 08/10/2021; notes reviewed.  At the time of his clinic visit, patient doing well overall from a cardiovascular  perspective.  He felt generally well.  He denied any episodes of chest pain, PND, orthopnea, palpitations, significant peripheral edema, vertiginous symptoms, or presyncope/syncope.  Patient with chronic stable shortness of breath related to his COPD/asthma overlap, ongoing smoking history, and multiple medical comorbidities.  Patient with a past medical history significant for cardiovascular diagnoses.  Patient underwent diagnostic left heart catheterization on 04/04/2002 that revealed a severely reduced left ventricular systolic function with an EF of 20%.  There was severe global hypokinesis noted.  Coronary anatomy was normal with no evidence of significant CAD noted.  Patient with a history of dilated cardiomyopathy.  TTE performed on 03/24/2004 revealed a severely reduced left ventricular systolic function with a severely reduced EF of 12%.  There was severe global hypokinesis noted.  Patient underwent coronary CTA on 07/28/2012 revealing a calcium score of 143.4.  Study revealed a LEFT dominant system with a small RCA.  There was no significant disease noted in the LAD, LCx, RCA.  Repeat coronary CTA was performed on 04/22/2015 revealing a calcium score of 192.4.  There was mild proximal LAD and proximal LCx disease.  Nondominant RCA small and normal.  Patient with known PVD.  He underwent mechanical thrombectomy, PTCA, and stent placement in the RIGHT superior femoral and popliteal arteries.  Last TTE was performed on 12/15/2020 revealing severe reduction of his left ventricular systolic function with an EF of <20%.  There was moderate to severe left ventricular dilation, in addition to moderate left atrial dilation.  There was mild mitral valve regurgitation noted.  Diastolic Doppler parameters consistent with a restrictive pattern (G3DD).   Given patient's extensive PVD and interventions thus far, he is on daily antiplatelet therapy using clopidogrel.  Patient is compliant with therapy with  no evidence or reports of GI bleeding.  Heart failure and blood pressure well controlled on currently prescribed beta-blocker, ARB/ANRi, and diuretic therapies.  Patient is on a statin for his HLD.  Patient with a prediabetes diagnosis; last HgbA1c unavailable for review.  Of note patient is on an SGLT2i (dapagliflozin) in the setting of elevated glucose levels and is known heart failure.  Functional capacity limited by cardiovascular/cardiopulmonary diagnoses.  Patient questionably able to achieve 4 METS of physical activity without angina/anginal equivalent symptoms.  No changes were made to his medication history.  Patient to follow-up with outpatient cardiology in 3 months or sooner if needed.  [name] recently found to have enlarged LEFT sided cervical lymph nodes.  FNA was performed on 08/14/2021 that was (+) for metastatic squamous cell carcinoma of an unknown primary.  Subsequent PET/CT performed on 08/28/2021 revealing cervical LAD. There was a 0.9 cm level 2 node (SUV 4.0), a 1.2 cm level 3 node (SUV 5.2), and multiple smaller FDG avid cervical nodes that were too small to characterize.  Patient is scheduled to undergo DIRECT LARYNGOSCOPY WITH BIOPSIES AND TONSILLECTOMY/ADENOIDECTOMY on 09/23/2021 with Dr. Clyde Canterbury, MD to further assess for primary site of nodal metastasis.  Given patient's past medical history significant for cardiovascular disease, presurgical cardiac clearance was sought by the performing surgeon's office and PAT team. Per cardiology, "this patient is optimized for surgery and may proceed with the planned procedural course with a MODERATE risk of significant perioperative cardiovascular complications".  Again, this patient is on daily antiplatelet therapy.  He has been instructed on recommendations from his cardiologist for holding his clopidogrel prior to surgery and for 7 days postoperatively.  He has discussed when to stop this medication with his primary attending  surgeon.  Patient reports previous perioperative complications with anesthesia in the past.  Patient reporting a history of (+) difficulty maintaining adequate anesthesia due to chronic opioid therapy; takes scheduled opioids 4 times daily.  In review of the available records, it is noted that patient underwent a general anesthetic course here (ASA III) in 07/2019 without documented complications.   Vitals with BMI 09/15/2021 09/15/2021 09/09/2021  Height '5\' 7"'$  '5\' 7"'$  -  Weight 148 lbs 148 lbs -  BMI 52.84 13.24 -  Systolic - 401 -  Diastolic - 72 -  Pulse - 83 83    Providers/Specialists:   NOTE: Primary physician provider listed below. Patient may have been seen by APP or partner within same practice.   PROVIDER ROLE / SPECIALTY LAST Toma Copier, MD Otolaryngology (Surgeon) 09/07/2021  Perrin Maltese, MD Primary Care Provider 08/10/2021  Neoma Laming, MD Cardiology 08/10/2021  Charlaine Dalton, MD Medical Oncology 09/09/2021  Noreene Filbert, MD Radiation Oncology 08/26/2021   Allergies:  Codeine and Dilaudid [hydromorphone]  Current Home Medications:   No current facility-administered medications for this encounter.    albuterol (VENTOLIN HFA) 108 (90 Base) MCG/ACT inhaler   ALPRAZolam (XANAX) 1 MG tablet   atorvastatin (LIPITOR) 80 MG tablet   carvedilol (COREG) 25 MG tablet   clopidogrel (PLAVIX) 75 MG tablet   ENTRESTO 49-51 MG   FARXIGA 10 MG TABS tablet   fluticasone (FLONASE) 50 MCG/ACT nasal spray   Fluticasone-Umeclidin-Vilant (TRELEGY ELLIPTA) 100-62.5-25 MCG/ACT AEPB   furosemide (LASIX) 40 MG tablet   ipratropium-albuterol (DUONEB) 0.5-2.5 (3) MG/3ML SOLN   lidocaine-prilocaine (EMLA) cream   ondansetron (ZOFRAN) 8 MG tablet   oxyCODONE (ROXICODONE) 15 MG immediate release tablet   prochlorperazine (COMPAZINE) 10 MG tablet   History:   Past  Medical History:  Diagnosis Date   Anxiety    a.) on BZO therapy   Aortic atherosclerosis (HCC)     Arthritis    knees, lower back   Asthma    Cardiomyopathy ([place])    a.) TTE 03/24/2004: EF 12%; sev LV dilation; severe global HK. b.) TTE 05/05/2011: EF 26%; LA/LV mod dilated; mild MR. c.) TTE 12/15/2020: EF <20%; mod-sev LV dilation; mod LA dilation; mild MR; G3DD.   Carotid artery stenosis    CHF (congestive heart failure) (HCC)    Chronic back pain    Chronic pain syndrome    CKD (chronic kidney disease), stage III (HCC)    Complication of anesthesia    a.) difficulty maintaining anesthesia due to chronic opioid therapy; taking scheduled opioids QID   COPD (chronic obstructive pulmonary disease) ([place])    Coronary artery disease    a.) LHC 04/04/2002 -> norm coronary anatomy; EF 20%.  b.)  Coronary CTA 07/28/2012: Ca score 143.4.  LAD, LCx, and RCA with no significant disease. c.) Coronary CTA 04/22/2015: Ca score 194.2; mild pLAD and LCx disease.   Diverticulitis    a.) s/p bowel resection   GERD (gastroesophageal reflux disease)    Gout    Hepatitis C virus infection without hepatic coma 02/2016   a.) genotype 1a;  Tx'd with Ledipasvir / Sofosbuvir course   History of ETOH abuse    History of kidney stones    HLD (hyperlipidemia)    Hypertension    LBBB (left bundle branch block)    Long term current use of antithrombotics/antiplatelets    a.) clopidogrel   Long term prescription benzodiazepine use    Long term prescription opiate use    Metastatic squamous cell carcinoma involving lymph node with unknown primary site ([place]) 08/14/2021   a.) FNA LEFT cervical lymph node 08/14/2021 -- (+) for metastatic squamous cell carcinoma; primary unknown. b.) PET CT 08/28/2021: LEFT cervical LAD --> 0.9 cm level 2 node (SUV 4.0), 1.2 cm level 3 node (SUV 5.2), smaller FDG avid cervical nodes noted, but too small to characterize.   Microcytic anemia    Prediabetes    PVD (peripheral vascular disease) with claudication ([place])    a.) 10/2019 --> s/p mechanical thrombectomy, PTCA, stent  placement to RIGHT SFA and popliteal arteries   SBO (small bowel obstruction) ([place]) 2007   a.) x 2   Symptomatic bradycardia    Past Surgical History:  Procedure Laterality Date   BACK SURGERY     CHOLECYSTECTOMY     COLON RESECTION  2007   due to diverticulitis   COLONOSCOPY WITH PROPOFOL N/A 03/29/2017   Procedure: COLONOSCOPY WITH PROPOFOL;  Surgeon: Lucilla Lame, MD;  Location: [facility] ENDOSCOPY;  Service: Endoscopy;  Laterality: N/A;   CYSTOSCOPY/URETEROSCOPY/HOLMIUM LASER/STENT PLACEMENT Left 07/16/2019   Procedure: CYSTOSCOPY/URETEROSCOPY/HOLMIUM LASER/STENT Exchange;  Surgeon: Hollice Espy, MD;  Location: [facility] ORS;  Service: Urology;  Laterality: Left;   ESOPHAGOGASTRODUODENOSCOPY (EGD) WITH PROPOFOL N/A 03/29/2017   Procedure: ESOPHAGOGASTRODUODENOSCOPY (EGD) WITH PROPOFOL;  Surgeon: Lucilla Lame, MD;  Location: [facility] ENDOSCOPY;  Service: Endoscopy;  Laterality: N/A;   FINGER SURGERY  2007   cut finger off with saw and was reattached   HERNIA REPAIR     HOLMIUM LASER APPLICATION N/A 90/24/0973   Procedure: HOLMIUM LASER APPLICATION;  Surgeon: Hollice Espy, MD;  Location: [facility] ORS;  Service: Urology;  Laterality: N/A;   IR IMAGING GUIDED PORT INSERTION  09/09/2021   IR NEPHROSTOMY PLACEMENT LEFT  05/28/2019  KIDNEY SURGERY  2021   KNEE ARTHROSCOPY Bilateral    LEFT HEART CATH AND CORONARY ANGIOGRAPHY Left 04/04/2002   Procedure: LEFT HEART CATH AND CORONARY ANGIOGRAPHY; Location: [place]; Surgeon: Bartholome Bill, MD   LOWER EXTREMITY ANGIOGRAPHY Right 11/09/2017   Procedure: LOWER EXTREMITY ANGIOGRAPHY;  Surgeon: Katha Cabal, MD;  Location: [place] [facility] [facility];  Service: Cardiovascular;  Laterality: Right;   LOWER EXTREMITY ANGIOGRAPHY Right 11/21/2018   Procedure: LOWER EXTREMITY ANGIOGRAPHY;  Surgeon: Katha Cabal, MD;  Location: [place] [facility];  Service: Cardiovascular;  Laterality: Right;   LOWER EXTREMITY ANGIOGRAPHY Right 09/18/2019   Procedure: LOWER  EXTREMITY ANGIOGRAPHY;  Surgeon: Katha Cabal, MD;  Location: [facility] [facility];  Service: Cardiovascular;  Laterality: Right;   NEPHROLITHOTOMY Left 05/28/2019   Procedure: NEPHROLITHOTOMY PERCUTANEOUS;  Surgeon: Hollice Espy, MD;  Location: [facility] ORS;  Service: Urology;  Laterality: Left;   SPINAL FUSION     Family History  Problem Relation Age of Onset   COPD Mother    Social History   Tobacco Use   Smoking status: Every Day    Packs/day: 0.50    Years: 25.00    Pack years: 12.50    Types: Cigarettes   Smokeless tobacco: Never  Vaping Use   Vaping Use: Never used  Substance Use Topics   Alcohol use: No   Drug use: No    Pertinent Clinical Results:  LABS: Labs reviewed: Acceptable for surgery.  No visits with results within 3 Day(s) from this visit.  Latest known visit with results is:  Office Visit on 09/01/2021  Component Date Value Ref Range Status   Sodium 09/01/2021 131 (L)  135 - 145 mmol/L Final   Potassium 09/01/2021 4.3  3.5 - 5.1 mmol/L Final   Chloride 09/01/2021 97 (L)  98 - 111 mmol/L Final   CO2 09/01/2021 25  22 - 32 mmol/L Final   Glucose, Bld 09/01/2021 158 (H)  70 - 99 mg/dL Final   Glucose reference range applies only to samples taken after fasting for at least 8 hours.   BUN 09/01/2021 44 (H)  8 - 23 mg/dL Final   Creatinine, Ser 09/01/2021 1.30 (H)  0.61 - 1.24 mg/dL Final   Calcium 09/01/2021 8.8 (L)  8.9 - 10.3 mg/dL Final   Total Protein 09/01/2021 6.7  6.5 - 8.1 g/dL Final   Albumin 09/01/2021 3.8  3.5 - 5.0 g/dL Final   AST 09/01/2021 51 (H)  15 - 41 U/L Final   ALT 09/01/2021 27  0 - 44 U/L Final   Alkaline Phosphatase 09/01/2021 72  38 - 126 U/L Final   Total Bilirubin 09/01/2021 2.6 (H)  0.3 - 1.2 mg/dL Final   GFR, Estimated 09/01/2021 >60  >60 mL/min Final   Comment: (NOTE) Calculated using the CKD-EPI Creatinine Equation (2021)    Anion gap 09/01/2021 9  5 - 15 Final   Performed at [facility], [place], [place] [address]   WBC 09/01/2021 5.8  4.0 - 10.5 K/uL Final   RBC 09/01/2021 4.75  4.22 - 5.81 MIL/uL Final   Hemoglobin 09/01/2021 13.6  13.0 - 17.0 g/dL Final   HCT 09/01/2021 41.7  39.0 - 52.0 % Final   MCV 09/01/2021 87.8  80.0 - 100.0 fL Final   MCH 09/01/2021 28.6  26.0 - 34.0 pg Final   MCHC 09/01/2021 32.6  30.0 - 36.0 g/dL Final   RDW 09/01/2021 16.0 (H)  11.5 - 15.5 %  Final   Platelets 09/01/2021 227  150 - 400 K/uL Final   nRBC 09/01/2021 0.0  0.0 - 0.2 % Final   Neutrophils Relative % 09/01/2021 77  % Final   Neutro Abs 09/01/2021 4.5  1.7 - 7.7 K/uL Final   Lymphocytes Relative 09/01/2021 11  % Final   Lymphs Abs 09/01/2021 0.7  0.7 - 4.0 K/uL Final   Monocytes Relative 09/01/2021 9  % Final   Monocytes Absolute 09/01/2021 0.5  0.1 - 1.0 K/uL Final   Eosinophils Relative 09/01/2021 1  % Final   Eosinophils Absolute 09/01/2021 0.1  0.0 - 0.5 K/uL Final   Basophils Relative 09/01/2021 1  % Final   Basophils Absolute 09/01/2021 0.0  0.0 - 0.1 K/uL Final   Immature Granulocytes 09/01/2021 1  % Final   Abs Immature Granulocytes 09/01/2021 0.03  0.00 - 0.07 K/uL Final   Performed at [facility], [address] [place], [place] [address]    ECG: Date: 09/21/2021 Time ECG obtained: 1017 AM Rate: 71 bpm Rhythm:  Normal sinus rhythm; nonspecific IVCD (history of LBBB) Axis (leads I and aVF): Right axis deviation Intervals: PR 184 ms. QRS 148 ms. QTc 502 ms. ST segment and T wave changes: No evidence of acute ST segment elevation or depression Comparison: Similar to previous tracing obtained on 12/14/2020   IMAGING / PROCEDURES: NUCLEAR MEDICINE PET IMAGING INITIAL SKULL BASE TO THIGH performed on 16/96/7893 Hypermetabolic left cervical lymph nodes consistent with nodal metastases. No primary mucosal malignancy identified in the head or neck. No evidence of distant metastatic disease. Incidental findings including probable reactive mediastinal lymph nodes,  a small right pleural effusion, nonobstructing right renal calculi and sequela of prior granulomatous disease. A small amount of pelvic ascites is noted, of uncertain etiology. Coronary and aortic atherosclerosis  Emphysema  CT SOFT TISSUE NECK WITH CONTRAST performed on 06/16/2021 Four abnormal level 5 lymph nodes on the LEFT. The largest 2 may have enlarged slightly since the ultrasound of 1 month ago.  The differential diagnosis is that of reactive nodal enlargement versus malignant nodal disease.  Consider either additional close clinical follow-up or fine-needle aspiration of the largest and most inferior level 5 node.  ULTRASOUND SOFT TISSUE HEAD AND NECK performed on 05/19/2021 At least three distinct lymph nodes measuring up to 7 mm in short axis extending along the LEFT cervical chain, corresponding with the palpable abnormality of concern. Although not technically enlarged by size criteria, these nodes are abnormal in appearance with loss of normal architecture and increased vascularity. Findings are nonspecific, and could be reactive in nature due to an underlying infectious or inflammatory process.  Possible nodal metastatic disease or lymphoproliferative disorder would be the primary differential considerations.  Clinical follow-up to resolution recommended. If these changes should persist and/or worsen, further assessment with dedicated contrast enhanced CT of the neck would be recommended for further evaluation.  TRANSTHORACIC ECHOCARDIOGRAM performed on 12/15/2020 Left ventricular ejection fraction, by estimation, is <20%. The left  ventricle has severely decreased function. The left ventricle demonstrates  global hypokinesis. The left ventricular internal cavity size was  moderately to severely dilated. Left  ventricular diastolic parameters are consistent with Grade III diastolic dysfunction (restrictive).  Right ventricular systolic function is normal. The right ventricular size  is normal. Tricuspid regurgitation signal is inadequate for assessing PA pressure.  Left atrial size was moderately dilated.  The mitral valve is normal in structure. Mild mitral valve regurgitation. No evidence of mitral stenosis.  The  aortic valve is normal in structure. Aortic valve regurgitation is not visualized. No aortic stenosis is present.   Impression and Plan:  [name] has been referred for pre-anesthesia review and clearance prior to him undergoing the planned anesthetic and procedural courses. Available labs, pertinent testing, and imaging results were personally reviewed by me. This patient has been appropriately cleared by cardiology with an overall MODERATE risk of significant perioperative cardiovascular complications.  Based on clinical review performed today (09/21/21), barring any significant acute changes in the patient's overall condition, it is anticipated that he will be able to proceed with the planned surgical intervention. Any acute changes in clinical condition may necessitate his procedure being postponed and/or cancelled. Patient will meet with anesthesia team (MD and/or CRNA) on the day of his procedure for preoperative evaluation/assessment. Questions regarding anesthetic course will be fielded at that time.   Pre-surgical instructions were reviewed with the patient during his PAT appointment and questions were fielded by PAT clinical staff. Patient was advised that if any questions or concerns arise prior to his procedure then he should return a call to PAT and/or his surgeon's office to discuss.  Honor Loh, MSN, APRN, FNP-C, CEN [facility]  Peri-operative Services Nurse Practitioner Phone: [phone] Fax: [phone] 09/21/21 10:21 AM  NOTE: This note has been prepared using Dragon dictation software. Despite my best ability to proofread, there is always the potential that unintentional transcriptional errors may still occur from  this process.

## 2021-09-21 ENCOUNTER — Other Ambulatory Visit: Payer: Self-pay

## 2021-09-21 ENCOUNTER — Encounter
Admission: RE | Admit: 2021-09-21 | Discharge: 2021-09-21 | Disposition: A | Discharge status: Home / Self Care | Payer: Medicare Other | Source: Ambulatory Visit | Attending: Otolaryngology | Admitting: Otolaryngology

## 2021-09-21 DIAGNOSIS — I447 Left bundle-branch block, unspecified: Secondary | ICD-10-CM | POA: Diagnosis not present

## 2021-09-21 DIAGNOSIS — I10 Essential (primary) hypertension: Secondary | ICD-10-CM | POA: Insufficient documentation

## 2021-09-21 DIAGNOSIS — Z01810 Encounter for preprocedural cardiovascular examination: Secondary | ICD-10-CM | POA: Diagnosis present

## 2021-09-22 ENCOUNTER — Other Ambulatory Visit: Payer: Medicare Other

## 2021-09-22 ENCOUNTER — Ambulatory Visit: Payer: Medicare Other

## 2021-09-22 MED ORDER — CHLORHEXIDINE GLUCONATE 0.12 % MT SOLN: 15.0000 mL | Freq: Once | OROMUCOSAL | Status: AC

## 2021-09-22 MED ORDER — ORAL CARE MOUTH RINSE: 15.0000 mL | Freq: Once | OROMUCOSAL | Status: AC

## 2021-09-22 MED ORDER — FAMOTIDINE 20 MG PO TABS: 20.0000 mg | ORAL_TABLET | Freq: Once | ORAL | Status: AC

## 2021-09-22 MED ORDER — SODIUM CHLORIDE 0.9 % IV SOLN: INTRAVENOUS | Status: DC

## 2021-09-23 ENCOUNTER — Other Ambulatory Visit: Payer: Self-pay

## 2021-09-23 ENCOUNTER — Encounter
Admission: RE | Disposition: A | Discharge status: Home / Self Care | Payer: Self-pay | Source: Home / Self Care | Attending: Otolaryngology

## 2021-09-23 ENCOUNTER — Encounter: Payer: Self-pay | Admitting: Otolaryngology

## 2021-09-23 ENCOUNTER — Ambulatory Visit
Admission: RE | Admit: 2021-09-23 | Discharge: 2021-09-23 | Disposition: A | Discharge status: Home / Self Care | Payer: Medicare Other | Attending: Otolaryngology | Admitting: Otolaryngology

## 2021-09-23 ENCOUNTER — Ambulatory Visit: Payer: Medicare Other | Admitting: Urgent Care

## 2021-09-23 DIAGNOSIS — C099 Malignant neoplasm of tonsil, unspecified: Secondary | ICD-10-CM | POA: Insufficient documentation

## 2021-09-23 DIAGNOSIS — I739 Peripheral vascular disease, unspecified: Secondary | ICD-10-CM | POA: Insufficient documentation

## 2021-09-23 DIAGNOSIS — I509 Heart failure, unspecified: Secondary | ICD-10-CM | POA: Insufficient documentation

## 2021-09-23 DIAGNOSIS — F17210 Nicotine dependence, cigarettes, uncomplicated: Secondary | ICD-10-CM | POA: Insufficient documentation

## 2021-09-23 DIAGNOSIS — I420 Dilated cardiomyopathy: Secondary | ICD-10-CM | POA: Diagnosis not present

## 2021-09-23 DIAGNOSIS — K098 Other cysts of oral region, not elsewhere classified: Secondary | ICD-10-CM | POA: Insufficient documentation

## 2021-09-23 DIAGNOSIS — I2510 Atherosclerotic heart disease of native coronary artery without angina pectoris: Secondary | ICD-10-CM | POA: Insufficient documentation

## 2021-09-23 DIAGNOSIS — F419 Anxiety disorder, unspecified: Secondary | ICD-10-CM | POA: Insufficient documentation

## 2021-09-23 DIAGNOSIS — G894 Chronic pain syndrome: Secondary | ICD-10-CM | POA: Diagnosis not present

## 2021-09-23 DIAGNOSIS — J358 Other chronic diseases of tonsils and adenoids: Secondary | ICD-10-CM | POA: Insufficient documentation

## 2021-09-23 DIAGNOSIS — N183 Chronic kidney disease, stage 3 unspecified: Secondary | ICD-10-CM | POA: Insufficient documentation

## 2021-09-23 DIAGNOSIS — I6529 Occlusion and stenosis of unspecified carotid artery: Secondary | ICD-10-CM | POA: Diagnosis not present

## 2021-09-23 DIAGNOSIS — Z79899 Other long term (current) drug therapy: Secondary | ICD-10-CM | POA: Diagnosis not present

## 2021-09-23 DIAGNOSIS — Z7902 Long term (current) use of antithrombotics/antiplatelets: Secondary | ICD-10-CM | POA: Diagnosis not present

## 2021-09-23 DIAGNOSIS — J449 Chronic obstructive pulmonary disease, unspecified: Secondary | ICD-10-CM | POA: Diagnosis not present

## 2021-09-23 DIAGNOSIS — E785 Hyperlipidemia, unspecified: Secondary | ICD-10-CM | POA: Insufficient documentation

## 2021-09-23 DIAGNOSIS — I130 Hypertensive heart and chronic kidney disease with heart failure and stage 1 through stage 4 chronic kidney disease, or unspecified chronic kidney disease: Secondary | ICD-10-CM | POA: Insufficient documentation

## 2021-09-23 DIAGNOSIS — C770 Secondary and unspecified malignant neoplasm of lymph nodes of head, face and neck: Secondary | ICD-10-CM | POA: Diagnosis present

## 2021-09-23 HISTORY — DX: Hyperlipidemia, unspecified: E78.5

## 2021-09-23 HISTORY — DX: Left bundle-branch block, unspecified: I44.7

## 2021-09-23 HISTORY — DX: Atherosclerotic heart disease of native coronary artery without angina pectoris: I25.10

## 2021-09-23 HISTORY — DX: Peripheral vascular disease, unspecified: I73.9

## 2021-09-23 HISTORY — DX: Long term (current) use of opiate analgesic: Z79.891

## 2021-09-23 HISTORY — DX: Chronic pain syndrome: G89.4

## 2021-09-23 HISTORY — DX: Gastro-esophageal reflux disease without esophagitis: K21.9

## 2021-09-23 HISTORY — DX: Alcohol abuse, in remission: F10.11

## 2021-09-23 HISTORY — DX: Other long term (current) drug therapy: Z79.899

## 2021-09-23 HISTORY — DX: Unspecified asthma, uncomplicated: J45.909

## 2021-09-23 HISTORY — DX: Long term (current) use of antithrombotics/antiplatelets: Z79.02

## 2021-09-23 HISTORY — DX: Occlusion and stenosis of unspecified carotid artery: I65.29

## 2021-09-23 HISTORY — DX: Atherosclerosis of aorta: I70.0

## 2021-09-23 HISTORY — DX: Chronic kidney disease, stage 3 unspecified: N18.30

## 2021-09-23 HISTORY — DX: Prediabetes: R73.03

## 2021-09-23 HISTORY — DX: Gout, unspecified: M10.9

## 2021-09-23 HISTORY — DX: Bradycardia, unspecified: R00.1

## 2021-09-23 HISTORY — DX: Iron deficiency anemia, unspecified: D50.9

## 2021-09-23 LAB — POCT I-STAT, CHEM 8
BUN: 42 mg/dL — ABNORMAL HIGH (ref 8–23)
Calcium, Ion: 1.12 mmol/L — ABNORMAL LOW (ref 1.15–1.40)
Chloride: 95 mmol/L — ABNORMAL LOW (ref 98–111)
Creatinine, Ser: 1.7 mg/dL — ABNORMAL HIGH (ref 0.61–1.24)
Glucose, Bld: 97 mg/dL (ref 70–99)
HCT: 42 % (ref 39.0–52.0)
Hemoglobin: 14.3 g/dL (ref 13.0–17.0)
Potassium: 4.7 mmol/L (ref 3.5–5.1)
Sodium: 133 mmol/L — ABNORMAL LOW (ref 135–145)
TCO2: 30 mmol/L (ref 22–32)

## 2021-09-23 LAB — GLUCOSE, CAPILLARY
Glucose-Capillary: 98 mg/dL (ref 70–99)
Glucose-Capillary: 96 mg/dL (ref 70–99)

## 2021-09-23 SURGERY — LARYNGOSCOPY
Anesthesia: General | Laterality: Left

## 2021-09-23 MED ORDER — OXYMETAZOLINE HCL 0.05 % NA SOLN
NASAL | Status: AC
  Filled 2021-09-23: qty 30

## 2021-09-23 MED ORDER — HYDROCODONE-ACETAMINOPHEN 7.5-325 MG/15ML PO SOLN
ORAL | Status: AC
  Filled 2021-09-23: qty 15

## 2021-09-23 MED ORDER — LIDOCAINE-EPINEPHRINE 0.5 %-1:200000 IJ SOLN
INTRAMUSCULAR | Status: AC
Start: 2021-09-23 — End: ?
  Filled 2021-09-23: qty 1

## 2021-09-23 MED ORDER — FENTANYL CITRATE (PF) 100 MCG/2ML IJ SOLN
25.0000 ug | INTRAMUSCULAR | Status: DC | PRN
  Administered 2021-09-23: 25 ug via INTRAVENOUS

## 2021-09-23 MED ORDER — ONDANSETRON HCL 4 MG/2ML IJ SOLN: 4.0000 mg | Freq: Once | INTRAMUSCULAR | Status: DC | PRN

## 2021-09-23 MED ORDER — LACTATED RINGERS IV SOLN: INTRAVENOUS | Status: DC | PRN

## 2021-09-23 MED ORDER — ETOMIDATE 2 MG/ML IV SOLN
INTRAVENOUS | Status: DC | PRN
  Administered 2021-09-23: 60 mg via INTRAVENOUS

## 2021-09-23 MED ORDER — MIDAZOLAM HCL 2 MG/2ML IJ SOLN
INTRAMUSCULAR | Status: AC
  Filled 2021-09-23: qty 2

## 2021-09-23 MED ORDER — LIDOCAINE-EPINEPHRINE 1 %-1:100000 IJ SOLN
INTRAMUSCULAR | Status: AC
  Filled 2021-09-23: qty 1

## 2021-09-23 MED ORDER — FAMOTIDINE 20 MG PO TABS
ORAL_TABLET | ORAL | Status: AC
  Administered 2021-09-23: 20 mg via ORAL
  Filled 2021-09-23: qty 1

## 2021-09-23 MED ORDER — ONDANSETRON HCL 4 MG/2ML IJ SOLN
INTRAMUSCULAR | Status: DC | PRN
  Administered 2021-09-23: 4 mg via INTRAVENOUS

## 2021-09-23 MED ORDER — LIDOCAINE HCL (CARDIAC) PF 100 MG/5ML IV SOSY: PREFILLED_SYRINGE | INTRAVENOUS | Status: DC | PRN

## 2021-09-23 MED ORDER — FENTANYL CITRATE (PF) 100 MCG/2ML IJ SOLN
INTRAMUSCULAR | Status: AC
  Filled 2021-09-23: qty 2

## 2021-09-23 MED ORDER — CHLORHEXIDINE GLUCONATE 0.12 % MT SOLN
OROMUCOSAL | Status: AC
  Administered 2021-09-23: 15 mL via OROMUCOSAL
  Filled 2021-09-23: qty 15

## 2021-09-23 MED ORDER — HYDROCODONE-ACETAMINOPHEN 7.5-325 MG/15ML PO SOLN
15.0000 mL | Freq: Four times a day (QID) | ORAL | Status: DC | PRN
  Administered 2021-09-23: 15 mL via ORAL

## 2021-09-23 MED ORDER — BUPIVACAINE-EPINEPHRINE (PF) 0.25% -1:200000 IJ SOLN
INTRAMUSCULAR | Status: AC
  Filled 2021-09-23: qty 30

## 2021-09-23 MED ORDER — LIDOCAINE HCL (PF) 2 % IJ SOLN
INTRAMUSCULAR | Status: AC
  Filled 2021-09-23: qty 5

## 2021-09-23 MED ORDER — DEXAMETHASONE SODIUM PHOSPHATE 10 MG/ML IJ SOLN
INTRAMUSCULAR | Status: DC | PRN
  Administered 2021-09-23: 10 mg via INTRAVENOUS

## 2021-09-23 MED ORDER — HYDROCODONE-ACETAMINOPHEN 7.5-325 MG/15ML PO SOLN: ORAL | 0 refills | Status: AC

## 2021-09-23 MED ORDER — SUCCINYLCHOLINE CHLORIDE 200 MG/10ML IV SOSY
PREFILLED_SYRINGE | INTRAVENOUS | Status: DC | PRN
Start: 2021-09-23 — End: 2021-09-23
  Administered 2021-09-23: 100 mg via INTRAVENOUS

## 2021-09-23 MED ORDER — 0.9 % SODIUM CHLORIDE (POUR BTL) OPTIME
TOPICAL | Status: DC | PRN
  Administered 2021-09-23: 100 mL

## 2021-09-23 MED ORDER — EPINEPHRINE PF 1 MG/ML IJ SOLN
INTRAMUSCULAR | Status: DC | PRN
  Administered 2021-09-23: .001 mg via INTRAVENOUS

## 2021-09-23 MED ORDER — PROPOFOL 10 MG/ML IV BOLUS
INTRAVENOUS | Status: AC
  Filled 2021-09-23: qty 20

## 2021-09-23 MED ORDER — FENTANYL CITRATE (PF) 100 MCG/2ML IJ SOLN
INTRAMUSCULAR | Status: AC
  Administered 2021-09-23: 25 ug via INTRAVENOUS
  Filled 2021-09-23: qty 2

## 2021-09-23 MED ORDER — BUPIVACAINE-EPINEPHRINE (PF) 0.25% -1:200000 IJ SOLN
INTRAMUSCULAR | Status: DC | PRN
  Administered 2021-09-23: 1 mL via PERINEURAL

## 2021-09-23 MED ORDER — FENTANYL CITRATE (PF) 100 MCG/2ML IJ SOLN
INTRAMUSCULAR | Status: DC | PRN
  Administered 2021-09-23 (×2): 50 ug via INTRAVENOUS

## 2021-09-23 MED ORDER — OXYMETAZOLINE HCL 0.05 % NA SOLN
NASAL | Status: DC | PRN
  Administered 2021-09-23: 3

## 2021-09-23 MED ORDER — MIDAZOLAM HCL 2 MG/2ML IJ SOLN
INTRAMUSCULAR | Status: DC | PRN
  Administered 2021-09-23: 2 mg via INTRAVENOUS

## 2021-09-23 SURGICAL SUPPLY — 24 items
CATH ROBINSON RED A/P 10FR (CATHETERS) ×4 IMPLANT
COAG SUCT 10F 3.5MM HAND CTRL (MISCELLANEOUS) ×4 IMPLANT
DEFOGGER ANTIFOG KIT (MISCELLANEOUS) ×4 IMPLANT
DRSG TELFA 4X3 1S NADH ST (GAUZE/BANDAGES/DRESSINGS) ×4 IMPLANT
ELECT REM PT RETURN 9FT ADLT (ELECTROSURGICAL) ×3
ELECTRODE REM PT RTRN 9FT ADLT (ELECTROSURGICAL) ×3 IMPLANT
GAUZE 4X4 16PLY ~~LOC~~+RFID DBL (SPONGE) ×4 IMPLANT
GLOVE SURG ENC MOIS LTX SZ7.5 (GLOVE) ×4 IMPLANT
GOWN STRL REUS W/ TWL LRG LVL3 (GOWN DISPOSABLE) ×6 IMPLANT
GOWN STRL REUS W/TWL LRG LVL3 (GOWN DISPOSABLE) ×6
HANDLE SUCTION POOLE (INSTRUMENTS) ×3 IMPLANT
KIT TURNOVER KIT A (KITS) ×4 IMPLANT
LABEL OR SOLS (LABEL) ×4 IMPLANT
MANIFOLD NEPTUNE II (INSTRUMENTS) ×4 IMPLANT
NDL ENDOSCOPIC URO 20G (NEEDLE) ×4 IMPLANT
NDL FILTER BLUNT 18X1 1/2 (NEEDLE) ×3 IMPLANT
NEEDLE FILTER BLUNT 18X 1/2SAF (NEEDLE) ×1
NEEDLE FILTER BLUNT 18X1 1/2 (NEEDLE) ×2 IMPLANT
NS IRRIG 500ML POUR BTL (IV SOLUTION) ×4 IMPLANT
PACK HEAD/NECK (MISCELLANEOUS) ×4 IMPLANT
PATTIES SURGICAL .5 X.5 (GAUZE/BANDAGES/DRESSINGS) ×4 IMPLANT
SPONGE TONSIL 1 RF SGL (DISPOSABLE) ×4 IMPLANT
SUCTION POOLE HANDLE (INSTRUMENTS) ×3
WATER STERILE IRR 500ML POUR (IV SOLUTION) ×4 IMPLANT

## 2021-09-23 NOTE — Anesthesia Preprocedure Evaluation (Signed)
Anesthesia Evaluation  ?Patient identified by MRN, date of birth, ID band ?Patient awake ? ? ? ?Reviewed: ?Allergy & Precautions, H&P , NPO status , Patient's Chart, lab work & pertinent test results, reviewed documented beta blocker date and time  ? ?History of Anesthesia Complications ?(+) history of anesthetic complications ? ?Airway ?Mallampati: II ? ?TM Distance: >3 FB ?Neck ROM: full ? ? ? Dental ? ?(+) Teeth Intact, Poor Dentition ?  ?Pulmonary ?neg shortness of breath, asthma , COPD, Current Smoker and Patient abstained from smoking.,  ?  ?Pulmonary exam normal ? ? ? ? ? ? ? Cardiovascular ?Exercise Tolerance: Poor ?hypertension, On Medications ?+ CAD, + Past MI, + Peripheral Vascular Disease and +CHF  ?Normal cardiovascular exam ?Rhythm:regular Rate:Normal ? ?Cleared by kahn.  Currently sx. Free and no anginal equ. ja ?  ?Neuro/Psych ?Anxiety negative neurological ROS ? negative psych ROS  ? GI/Hepatic ?GERD  Medicated,(+) Hepatitis -  ?Endo/Other  ?negative endocrine ROS ? Renal/GU ?Renal disease  ?negative genitourinary ?  ?Musculoskeletal ? ? Abdominal ?  ?Peds ? Hematology ? ?(+) Blood dyscrasia, anemia ,   ?Anesthesia Other Findings ?Past Medical History: ?No date: Anxiety ?    Comment:  a.) on BZO therapy ?No date: Aortic atherosclerosis ([place]) ?No date: Arthritis ?    Comment:  knees, lower back ?No date: Asthma ?No date: Cardiomyopathy [facility]) ?    Comment:  a.) TTE 03/24/2004: EF 12%; sev LV dilation; severe  ?             global HK. b.) TTE 05/05/2011: EF 26%; LA/LV mod dilated; ?             mild MR. c.) TTE 12/15/2020: EF <20%; mod-sev LV  ?             dilation; mod LA dilation; mild MR; G3DD. ?No date: Carotid artery stenosis ?No date: CHF (congestive heart failure) ([place]) ?No date: Chronic back pain ?No date: Chronic pain syndrome ?No date: CKD (chronic kidney disease), stage III ([place]) ?No date: Complication of anesthesia ?    Comment:  a.) difficulty maintaining  anesthesia due to chronic  ?             opioid therapy; taking scheduled opioids QID ?No date: COPD (chronic obstructive pulmonary disease) ([place]) ?No date: Coronary artery disease ?    Comment:  a.) LHC 04/04/2002 -> norm coronary anatomy; EF 20%.   ?             b.)  Coronary CTA 07/28/2012: Ca score 143.4.  LAD, LCx,  ?             and RCA with no significant disease. c.) Coronary CTA  ?             04/22/2015: Ca score 194.2; mild pLAD and LCx disease. ?No date: Diverticulitis ?    Comment:  a.) s/p bowel resection ?No date: GERD (gastroesophageal reflux disease) ?No date: Gout ?02/2016: Hepatitis C virus infection without hepatic coma ?    Comment:  a.) genotype 1a;  Tx'd with Ledipasvir / Sofosbuvir  ?             course ?No date: History of ETOH abuse ?No date: History of kidney stones ?No date: HLD (hyperlipidemia) ?No date: Hypertension ?No date: LBBB (left bundle branch block) ?No date: Long term current use of antithrombotics/antiplatelets ?    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    Comment:  a.) FNA LEFT cervical lymph node 08/14/2021 -- (+) for  ?             metastatic squamous cell carcinoma; primary unknown. b.)  ?             PET CT 08/28/2021: LEFT cervical LAD --> 0.9 cm level 2  ?             node (SUV 4.0), 1.2 cm level 3 node (SUV 5.2), smaller  ?             FDG avid cervical nodes noted, but too small to  ?             characterize. ?No date: Microcytic anemia ?No date: Prediabetes ?No date: PVD (peripheral vascular disease) with claudication ([place]) ?    Comment:  a.) 10/2019 --> s/p mechanical thrombectomy, PTCA, stent ?             placement to RIGHT SFA and popliteal arteries ?2007: SBO (small bowel obstruction) ([place]) ?    Comment:  a.) x 2 ?No date: Symptomatic bradycardia ?Past Surgical History: ?No  date: BACK SURGERY ?No date: CHOLECYSTECTOMY ?2007: COLON RESECTION ?    Comment:  due to diverticulitis ?03/29/2017: COLONOSCOPY WITH PROPOFOL; N/A ?    Comment:  Procedure: COLONOSCOPY WITH PROPOFOL;  Surgeon: Allen Norris,  ?             Darren, MD;  Location: [place];  Service:  ?             Endoscopy;  Laterality: N/A; ?07/16/2019: CYSTOSCOPY/URETEROSCOPY/HOLMIUM LASER/STENT PLACEMENT;  ?Left ?    Comment:  Procedure: CYSTOSCOPY/URETEROSCOPY/HOLMIUM LASER/STENT  ?             Exchange;  Surgeon: Hollice Espy, MD;  Location: [facility]  ?             ORS;  Service: Urology;  Laterality: Left; ?03/29/2017: ESOPHAGOGASTRODUODENOSCOPY (EGD) WITH PROPOFOL; N/A ?    Comment:  Procedure: ESOPHAGOGASTRODUODENOSCOPY (EGD) WITH  ?             PROPOFOL;  Surgeon: Lucilla Lame, MD;  Location: [facility]  ?             ENDOSCOPY;  Service: Endoscopy;  Laterality: N/A; ?2007: FINGER SURGERY ?    Comment:  cut finger off with saw and was reattached ?No date: HERNIA REPAIR ?05/28/2019: HOLMIUM LASER APPLICATION; N/A ?    Comment:  Procedure: HOLMIUM LASER APPLICATION;  Surgeon: Erlene Quan, ?             Caryl Pina, MD;  Location: [facility] ORS;  Service: Urology;   ?             Laterality: N/A; ?09/09/2021: IR IMAGING GUIDED PORT INSERTION ?05/28/2019: IR NEPHROSTOMY PLACEMENT LEFT ?2021: KIDNEY SURGERY ?No date: KNEE ARTHROSCOPY; Bilateral ?04/04/2002: LEFT HEART CATH AND CORONARY ANGIOGRAPHY; Left ?    Comment:  Procedure: LEFT HEART CATH AND CORONARY ANGIOGRAPHY;  ?             Location: [place]; Surgeon: Bartholome Bill, MD ?11/09/2017: LOWER EXTREMITY ANGIOGRAPHY; Right ?    Comment:  Procedure: LOWER EXTREMITY ANGIOGRAPHY;  Surgeon:  ?             Katha Cabal, MD;  Location: [place] CV LAB;  ?  Service: Cardiovascular;  Laterality: Right; ?11/21/2018: LOWER EXTREMITY ANGIOGRAPHY; Right ?    Comment:  Procedure: LOWER EXTREMITY ANGIOGRAPHY;  Surgeon:  ?             Katha Cabal, MD;  Location: [place] CV LAB;  ?              Service: Cardiovascular;  Laterality: Right; ?09/18/2019: LOWER EXTREMITY ANGIOGRAPHY; Right ?    Comment:  Procedure: LOWER EXTREMITY ANGIOGRAPHY;  Surgeon:  ?             Katha Cabal, MD;  Location: [place] INVASIVE CV LAB;  ?             Service: Cardiovascular;  Laterality: Right; ?05/28/2019: NEPHROLITHOTOMY; Left ?    Comment:  Procedure: NEPHROLITHOTOMY PERCUTANEOUS;  Surgeon:  ?             Hollice Espy, MD;  Location: [facility] ORS;  Service:  ?             Urology;  Laterality: Left; ?No date: SPINAL FUSION ?BMI   ? Body Mass Index: 23.65 kg/m?  ?  ? Reproductive/Obstetrics ?negative OB ROS ? ?  ? ? ? ? ? ? ? ? ? ? ? ? ? ?  ?  ? ? ? ? ? ? ? ? ?Anesthesia Physical ?Anesthesia Plan ? ?ASA: 4 ? ?Anesthesia Plan: General ETT  ? ?Post-op Pain Management:   ? ?Induction:  ? ?PONV Risk Score and Plan: 2 ? ?Airway Management Planned:  ? ?Additional Equipment:  ? ?Intra-op Plan:  ? ?Post-operative Plan:  ? ?Informed Consent: I have reviewed the patients History and Physical, chart, labs and discussed the procedure including the risks, benefits and alternatives for the proposed anesthesia with the patient or authorized representative who has indicated his/her understanding and acceptance.  ? ? ? ?Dental Advisory Given ? ?Plan Discussed with: CRNA ? ?Anesthesia Plan Comments:   ? ? ? ? ? ? ?Anesthesia Quick Evaluation ? ?

## 2021-09-23 NOTE — Op Note (Signed)
09/23/2021 ? ?9:29 AM ? ? ? ?[name] ? ?[id number] ? ? 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 ?Post-op Diagnosis: SAME ? ?Procedure: 1) Adenoidectomy/ nasopharyngeal biopsies, 2) left tonsillectomy 3) Direct laryngoscopy with biopsies ? ?Surgeon: Riley Nearing., MD ? ?Anesthesia:  General endotracheal ? ?EBL:  Less than 25 cc ? ?Complications:  None ? ?Findings: No definitive lesions found. Minimal adenoid tissue with small nasopharyngeal cleft, but no abnormal appearing lesions. 1+ tonsil. Biopsies from left tongue base taken.  ? ?Procedure: The patient was taken to the Operating Room and placed in the supine position.  After induction of general endotracheal anesthesia, the table was turned 90 degrees and the patient was draped in the usual fashion  with the eyes protected.  A tooth guard was used to protect the upper teeth.  A Dedo laryngoscope was then used to evaluate the oropharynx, hypopharynx and larynx.  No specific lesions were seen in these regions.  The tongue base was palpated and there was no palpable mass.  Several biopsies were taken from the left tongue base in case a subclinical lesion might be present.  Minor bleeding was controlled with an Afrin moistened pack.   ? ?A mouth gag was inserted into the oral cavity to open the mouth, and examination of the oropharynx showed the uvula was non-bifid. The palate was palpated, and there was no evidence of submucous cleft.  A red rubber catheter was placed through the nostril and used to retract the palate.  Examination of the nasopharynx with a 0 degree scope showed no specific suspicious lesions.  There was a slight bit of clefting of the tissue in the nasopharynx.  Previously this had been inflamed but that appeared to be resolved.  Under indirect vision with the mirror, an adenotome was placed in the nasopharynx.  An attempt was made to curette the adenoids, however since they were not very  prominent minimal tissue was obtained.  Further biopsies were obtained using the Kossuth forceps, as well as directly visualizing the adenoids with the 0 degree scope and biopsy and using a straight cup forcep.    Afrin moistened nasopharyngeal packs were then placed to control bleeding.  The nasopharyngeal packs were removed.  Suction cautery was then used to cauterize the nasopharyngeal bed to obtain hemostasis.  ? ?The oropharynx was carefully inspected and again no specific lesions were seen, however as planned, the left tonsil was removed for surveillance biopsies.  The Left tonsil was grasped with an Allis clamp and resected from the tonsillar fossa in the usual fashion with the Bovie. The Bovie was used to obtain hemostasis. The tonsillar fossa was then carefully injected with 0.25% marcaine with epinephrine, 1:200,000, avoiding intravascular injection. The nose and throat were irrigated and suctioned to remove any adenoid debris or blood clot. The red rubber catheter and mouth gag were  removed with no evidence of active bleeding.  The patient was then returned to the anesthesiologist for awakening, and was taken to the Recovery Room in stable condition. ? ?Cultures:  None. ? ?Specimens:  Adenoid tissue, left tonsil, left tongue base tissue.  ? ?Disposition:   PACU to home ? ?Plan: Soft, bland diet and push fluids. Take pain medications  as prescribed. No strenuous activity for 2 weeks. Follow-up in 3 weeks. ? ?Riley Nearing ?09/23/2021 ?9:29 AM ? ?  ?

## 2021-09-23 NOTE — Anesthesia Procedure Notes (Signed)
Procedure Name: Intubation ?Date/Time: 09/23/2021 8:45 AM ?Performed by: Beverely Low, CRNA ?Pre-anesthesia Checklist: Patient identified, Patient being monitored, Timeout performed, Emergency Drugs available and Suction available ?Patient Re-evaluated:Patient Re-evaluated prior to induction ?Oxygen Delivery Method: Circle system utilized ?Preoxygenation: Pre-oxygenation with 100% oxygen ?Induction Type: IV induction ?Ventilation: Mask ventilation without difficulty ?Laryngoscope Size: Mac and 3 ?Grade View: Grade I ?Tube type: Oral Dwyane Luo ?Tube size: 7.5 mm ?Number of attempts: 1 ?Airway Equipment and Method: Stylet and LTA kit utilized ?Placement Confirmation: ETT inserted through vocal cords under direct vision, positive ETCO2 and breath sounds checked- equal and bilateral ?Secured at: 21 cm ?Tube secured with: Tape ?Dental Injury: Teeth and Oropharynx as per pre-operative assessment  ? ? ? ? ?

## 2021-09-23 NOTE — H&P (Signed)
History and physical reviewed and will be scanned in later. No change in medical status reported by the patient or family, appears stable for surgery. All questions regarding the procedure answered, and patient (or family if a child) expressed understanding of the procedure. ? ?[name] [name] ?@TODAY@ ?

## 2021-09-23 NOTE — Transfer of Care (Signed)
Immediate Anesthesia Transfer of Care Note ? ?Patient: [name] ? ?Procedure(s) Performed: DIRECT LARYNGOSCOPY WITH BIOPSIES (Bilateral) ?TONSILLECTOMY AND ADENOIDECTOMY (Left) ? ?Patient Location: PACU ? ?Anesthesia Type:General ? ?Level of Consciousness: drowsy and patient cooperative ? ?Airway & Oxygen Therapy: Patient Spontanous Breathing and Patient connected to face mask oxygen ? ?Post-op Assessment: Report given to RN and Post -op Vital signs reviewed and stable ? ?Post vital signs: Reviewed and stable ? ?Last Vitals:  ?Vitals Value Taken Time  ?BP 116/77 09/23/21 0940  ?Temp 36.1 ?C 09/23/21 0940  ?Pulse 66 09/23/21 0943  ?Resp 13 09/23/21 0943  ?SpO2 94 % 09/23/21 0943  ?Vitals shown include unvalidated device data. ? ?Last Pain:  ?Vitals:  ? 09/23/21 0940  ?TempSrc:   ?PainSc: Asleep  ?   ? ?  ? ?Complications: No notable events documented. ?

## 2021-09-23 NOTE — Discharge Instructions (Signed)

## 2021-09-24 ENCOUNTER — Other Ambulatory Visit: Payer: Self-pay | Admitting: Anatomic Pathology & Clinical Pathology

## 2021-09-24 ENCOUNTER — Encounter: Payer: Self-pay | Admitting: Otolaryngology

## 2021-09-24 NOTE — Progress Notes (Signed)
Pharmacist Chemotherapy Monitoring - Initial Assessment   ? ?Anticipated start date: 10/01/21  ? ?The following has been reviewed per standard work regarding the patient's treatment regimen: ?The patient's diagnosis, treatment plan and drug doses, and organ/hematologic function ?Lab orders and baseline tests specific to treatment regimen  ?The treatment plan start date, drug sequencing, and pre-medications ?Prior authorization status  ?Patient's documented medication list, including drug-drug interaction screen and prescriptions for anti-emetics and supportive care specific to the treatment regimen ?The drug concentrations, fluid compatibility, administration routes, and timing of the medications to be used ?The patient's access for treatment and lifetime cumulative dose history, if applicable  ?The patient's medication allergies and previous infusion related reactions, if applicable  ? ?Changes made to treatment plan:  ?treatment plan date ? ?Follow up needed:  ?signing treatment plan ? ? ?Adelina Mings, [facility], ?09/24/2021  10:01 AM  ?

## 2021-09-24 NOTE — Anesthesia Postprocedure Evaluation (Signed)
Anesthesia Post Note ? ?Patient: RAHMAN FERRALL ? ?Procedure(s) Performed: DIRECT LARYNGOSCOPY WITH BIOPSIES (Bilateral) ?TONSILLECTOMY AND ADENOIDECTOMY (Left) ? ?Patient location during evaluation: PACU ?Anesthesia Type: General ?Level of consciousness: awake and alert ?Pain management: pain level controlled ?Vital Signs Assessment: post-procedure vital signs reviewed and stable ?Respiratory status: spontaneous breathing, nonlabored ventilation, respiratory function stable and patient connected to nasal cannula oxygen ?Cardiovascular status: blood pressure returned to baseline and stable ?Postop Assessment: no apparent nausea or vomiting ?Anesthetic complications: no ? ? ?No notable events documented. ? ? ?Last Vitals:  ?Vitals:  ? 09/23/21 1041 09/23/21 1059  ?BP: 112/79 126/80  ?Pulse: 71 78  ?Resp: 18 18  ?Temp: (!) 36 ?C   ?SpO2: 94% 96%  ?  ?Last Pain:  ?Vitals:  ? 09/24/21 0840  ?TempSrc:   ?PainSc: 6   ? ? ?  ?  ?  ?  ?  ?  ? ?Molli Barrows ? ? ? ? ?

## 2021-09-26 ENCOUNTER — Telehealth: Payer: Self-pay | Admitting: Internal Medicine

## 2021-09-26 NOTE — Telephone Encounter (Signed)
I spoke to patient and wife regarding results of the recent results of the surgery-tonsillar cancer left side.  Patient will start weekly carbo-taxol chemotherapy as planned on 3/23. ? ?Would defer to Dr. Donella Stade regarding-starting radiation. ? ?Patient and wife in agreement.  ?

## 2021-09-27 ENCOUNTER — Other Ambulatory Visit: Payer: Self-pay | Admitting: Internal Medicine

## 2021-09-28 ENCOUNTER — Other Ambulatory Visit: Payer: Self-pay

## 2021-09-28 DIAGNOSIS — C7989 Secondary malignant neoplasm of other specified sites: Secondary | ICD-10-CM

## 2021-09-28 LAB — SURGICAL PATHOLOGY

## 2021-09-28 NOTE — Telephone Encounter (Signed)
Done already

## 2021-09-28 NOTE — Telephone Encounter (Signed)
Referral placed, please sch appt for pt, thanks. ?

## 2021-09-30 ENCOUNTER — Ambulatory Visit
Admission: RE | Admit: 2021-09-30 | Discharge: 2021-09-30 | Disposition: A | Discharge status: Home / Self Care | Payer: Medicare Other | Source: Ambulatory Visit | Attending: Radiation Oncology | Admitting: Radiation Oncology

## 2021-09-30 DIAGNOSIS — Z510 Encounter for antineoplastic radiation therapy: Secondary | ICD-10-CM | POA: Diagnosis not present

## 2021-10-01 ENCOUNTER — Other Ambulatory Visit: Payer: Self-pay

## 2021-10-01 ENCOUNTER — Other Ambulatory Visit: Payer: Medicare Other

## 2021-10-01 ENCOUNTER — Other Ambulatory Visit: Payer: Self-pay | Admitting: Oncology

## 2021-10-01 ENCOUNTER — Inpatient Hospital Stay: Payer: Medicare Other | Admitting: Nurse Practitioner

## 2021-10-01 ENCOUNTER — Inpatient Hospital Stay: Payer: Medicare Other

## 2021-10-01 ENCOUNTER — Encounter: Payer: Self-pay | Admitting: Nurse Practitioner

## 2021-10-01 VITALS — BP 104/71 | HR 62 | Temp 95.1°F

## 2021-10-01 VITALS — BP 102/68 | HR 71 | Temp 94.6°F | Ht 67.0 in | Wt 147.8 lb

## 2021-10-01 DIAGNOSIS — C7989 Secondary malignant neoplasm of other specified sites: Secondary | ICD-10-CM | POA: Diagnosis not present

## 2021-10-01 DIAGNOSIS — C801 Malignant (primary) neoplasm, unspecified: Secondary | ICD-10-CM | POA: Diagnosis not present

## 2021-10-01 DIAGNOSIS — C760 Malignant neoplasm of head, face and neck: Secondary | ICD-10-CM

## 2021-10-01 DIAGNOSIS — Z510 Encounter for antineoplastic radiation therapy: Secondary | ICD-10-CM | POA: Diagnosis not present

## 2021-10-01 DIAGNOSIS — Z5111 Encounter for antineoplastic chemotherapy: Secondary | ICD-10-CM

## 2021-10-01 LAB — CBC WITH DIFFERENTIAL/PLATELET
Abs Immature Granulocytes: 0.03 10*3/uL (ref 0.00–0.07)
Basophils Absolute: 0 10*3/uL (ref 0.0–0.1)
Basophils Relative: 0 %
Eosinophils Absolute: 0.2 10*3/uL (ref 0.0–0.5)
Eosinophils Relative: 3 %
HCT: 41.7 % (ref 39.0–52.0)
Hemoglobin: 13.4 g/dL (ref 13.0–17.0)
Immature Granulocytes: 0 %
Lymphocytes Relative: 10 %
Lymphs Abs: 0.7 10*3/uL (ref 0.7–4.0)
MCH: 28.6 pg (ref 26.0–34.0)
MCHC: 32.1 g/dL (ref 30.0–36.0)
MCV: 89.1 fL (ref 80.0–100.0)
Monocytes Absolute: 0.9 10*3/uL (ref 0.1–1.0)
Monocytes Relative: 12 %
Neutro Abs: 5.3 10*3/uL (ref 1.7–7.7)
Neutrophils Relative %: 75 %
Platelets: 208 10*3/uL (ref 150–400)
RBC: 4.68 MIL/uL (ref 4.22–5.81)
RDW: 15.5 % (ref 11.5–15.5)
WBC: 7 10*3/uL (ref 4.0–10.5)
nRBC: 0 % (ref 0.0–0.2)

## 2021-10-01 LAB — COMPREHENSIVE METABOLIC PANEL
ALT: 23 U/L (ref 0–44)
AST: 49 U/L — ABNORMAL HIGH (ref 15–41)
Albumin: 3.5 g/dL (ref 3.5–5.0)
Alkaline Phosphatase: 86 U/L (ref 38–126)
Anion gap: 10 (ref 5–15)
BUN: 48 mg/dL — ABNORMAL HIGH (ref 8–23)
CO2: 29 mmol/L (ref 22–32)
Calcium: 8.9 mg/dL (ref 8.9–10.3)
Chloride: 94 mmol/L — ABNORMAL LOW (ref 98–111)
Creatinine, Ser: 1.67 mg/dL — ABNORMAL HIGH (ref 0.61–1.24)
GFR, Estimated: 46 mL/min — ABNORMAL LOW (ref >60)
Glucose, Bld: 174 mg/dL — ABNORMAL HIGH (ref 70–99)
Potassium: 3.8 mmol/L (ref 3.5–5.1)
Sodium: 133 mmol/L — ABNORMAL LOW (ref 135–145)
Total Bilirubin: 1.3 mg/dL — ABNORMAL HIGH (ref 0.3–1.2)
Total Protein: 6.5 g/dL (ref 6.5–8.1)

## 2021-10-01 MED ORDER — SODIUM CHLORIDE 0.9 % IV SOLN
Freq: Once | INTRAVENOUS | Status: AC
  Filled 2021-10-01: qty 250

## 2021-10-01 MED ORDER — SODIUM CHLORIDE 0.9 % IV SOLN
133.8000 mg | Freq: Once | INTRAVENOUS | Status: AC
  Administered 2021-10-01: 130 mg via INTRAVENOUS
  Filled 2021-10-01: qty 13

## 2021-10-01 MED ORDER — HEPARIN SOD (PORK) LOCK FLUSH 100 UNIT/ML IV SOLN
500.0000 [IU] | Freq: Once | INTRAVENOUS | Status: AC | PRN
  Filled 2021-10-01: qty 5

## 2021-10-01 MED ORDER — SODIUM CHLORIDE 0.9 % IV SOLN
45.0000 mg/m2 | Freq: Once | INTRAVENOUS | Status: AC
  Administered 2021-10-01: 78 mg via INTRAVENOUS
  Filled 2021-10-01: qty 13

## 2021-10-01 MED ORDER — HEPARIN SOD (PORK) LOCK FLUSH 100 UNIT/ML IV SOLN
INTRAVENOUS | Status: AC
  Administered 2021-10-01: 500 [IU]
  Filled 2021-10-01: qty 5

## 2021-10-01 MED ORDER — SODIUM CHLORIDE 0.9 % IV SOLN
10.0000 mg | Freq: Once | INTRAVENOUS | Status: AC
  Administered 2021-10-01: 10 mg via INTRAVENOUS
  Filled 2021-10-01: qty 10

## 2021-10-01 MED ORDER — FAMOTIDINE IN NACL 20-0.9 MG/50ML-% IV SOLN
20.0000 mg | Freq: Once | INTRAVENOUS | Status: AC
  Administered 2021-10-01: 20 mg via INTRAVENOUS
  Filled 2021-10-01: qty 50

## 2021-10-01 MED ORDER — PALONOSETRON HCL INJECTION 0.25 MG/5ML
0.2500 mg | Freq: Once | INTRAVENOUS | Status: AC
  Administered 2021-10-01: 0.25 mg via INTRAVENOUS
  Filled 2021-10-01: qty 5

## 2021-10-01 MED ORDER — DIPHENHYDRAMINE HCL 50 MG/ML IJ SOLN
50.0000 mg | Freq: Once | INTRAMUSCULAR | Status: AC
  Administered 2021-10-01: 50 mg via INTRAVENOUS
  Filled 2021-10-01: qty 1

## 2021-10-01 NOTE — Progress Notes (Addendum)
Tumor Board Documentation ? ?[name] was presented by Alease Medina, NP at our Tumor Board on 10/01/2021, which included representatives from medical oncology, pathology, radiology, surgical, radiation oncology, navigation, internal medicine, palliative care, research, genetics, pulmonology, pharmacy. ? ?Male currently presents as a current patient, for [place], for new positive pathology with history of the following treatments: surgical intervention(s), adjuvant chemotherapy. ? ?Additionally, we reviewed previous medical and familial history, history of present illness, and recent lab results along with all available histopathologic and imaging studies. The tumor board considered available treatment options and made the following recommendations: ?Concurrent chemo-radiation therapy ?  ? ?The following procedures/referrals were also placed: No orders of the defined types were placed in this encounter. ? ? ?Clinical Trial Status: not discussed  ? ?Staging used: Clinical Stage ?AJCC Staging: ?T: 0 ?N:2 B ?M: 0 ?Group: Metastatic Squamous Cell Carcinoma  P16 positive ? ? ?National site-specific guidelines NCCN were discussed with respect to the case. ? ?Tumor board is a meeting of clinicians from various specialty areas who evaluate and discuss patients for whom a multidisciplinary approach is being considered. Final determinations in the plan of care are those of the provider(s). The responsibility for follow up of recommendations given during tumor board is that of the provider.  ? ?Today?s extended care, comprehensive team conference, [name] was not present for the discussion and was not examined.  ? ?Multidisciplinary Tumor Board is a multidisciplinary case peer review process.  Decisions discussed in the Multidisciplinary Tumor Board reflect the opinions of the specialists present at the conference without having examined the patient.  Ultimately, treatment and diagnostic decisions rest with the primary  provider(s) and the patient. ? ?

## 2021-10-01 NOTE — Patient Instructions (Signed)
[facility] CANCER CTR AT [place]  Discharge Instructions: ?Thank you for choosing [facility] to provide your oncology and hematology care.  ?If you have a lab appointment with the [facility], please go directly to the [facility] and check in at the registration area. ? ?Wear comfortable clothing and clothing appropriate for easy access to any Portacath or PICC line.  ? ?We strive to give you quality time with your provider. You may need to reschedule your appointment if you arrive late (15 or more minutes).  Arriving late affects you and other patients whose appointments are after yours.  Also, if you miss three or more appointments without notifying the office, you may be dismissed from the clinic at the provider?s discretion.    ?  ?For prescription refill requests, have your pharmacy contact our office and allow 72 hours for refills to be completed.   ? ?Today you received the following chemotherapy and/or immunotherapy agents Taxol and Carboplatin     ?  ?To help prevent nausea and vomiting after your treatment, we encourage you to take your nausea medication as directed. ? ?BELOW ARE SYMPTOMS THAT SHOULD BE REPORTED IMMEDIATELY: ?*FEVER GREATER THAN 100.4 F (38 ?C) OR HIGHER ?*CHILLS OR SWEATING ?*NAUSEA AND VOMITING THAT IS NOT CONTROLLED WITH YOUR NAUSEA MEDICATION ?*UNUSUAL SHORTNESS OF BREATH ?*UNUSUAL BRUISING OR BLEEDING ?*URINARY PROBLEMS (pain or burning when urinating, or frequent urination) ?*BOWEL PROBLEMS (unusual diarrhea, constipation, pain near the anus) ?TENDERNESS IN MOUTH AND THROAT WITH OR WITHOUT PRESENCE OF ULCERS (sore throat, sores in mouth, or a toothache) ?UNUSUAL RASH, SWELLING OR PAIN  ?UNUSUAL VAGINAL DISCHARGE OR ITCHING  ? ?Items with * indicate a potential emergency and should be followed up as soon as possible or go to the Emergency Department if any problems should occur. ? ?Please show the CHEMOTHERAPY ALERT CARD or IMMUNOTHERAPY ALERT CARD at  check-in to the Emergency Department and triage nurse. ? ?Should you have questions after your visit or need to cancel or reschedule your appointment, please contact [facility] CANCER [place] AT [place]  [phone] and follow the prompts.  Office hours are 8:00 a.m. to 4:30 p.m. Monday - Friday. Please note that voicemails left after 4:00 p.m. may not be returned until the following business day.  We are closed weekends and major holidays. You have access to a nurse at all times for urgent questions. Please call the main number to the clinic [phone] and follow the prompts. ? ?For any non-urgent questions, you may also contact your provider using MyChart. We now offer e-Visits for anyone 62 and older to request care online for non-urgent symptoms. For details visit [email or web address]. ?  ?Also download the MyChart app! Go to the app store, search "MyChart", open the app, select , and log in with your MyChart username and password. ? ?Due to Covid, a mask is required upon entering the hospital/clinic. If you do not have a mask, one will be given to you upon arrival. For doctor visits, patients may have 1 support person aged 30 or older with them. For treatment visits, patients cannot have anyone with them due to current Covid guidelines and our immunocompromised population.  ?

## 2021-10-01 NOTE — Progress Notes (Signed)
[place] ?CONSULT NOTE ? ?Patient Care Team: ?Perrin Maltese, MD as PCP - General (Internal Medicine) ?Cammie Sickle, MD as Consulting Physician (Hematology and Oncology) ? ?CHIEF COMPLAINTS/PURPOSE OF CONSULTATION: head and neck cancer ? ?#  ?Oncology History Overview Note  ?# DEC 2023- CT scan neck-Four abnormal level 5 lymph nodes on the left asThe largest 2 may have enlarged slightly since the ultrasound of 1 ?month ago. The differential diagnosis is that of reactive nodal ?enlargement versus malignant nodal disease. Consider either ?additional close clinical follow-up or fine-needle aspiration of the ?largest and most inferior level 5 node. ? ?# s/p FNA- left neck LN- [dr.Bennett; FEB 2023]- DIAGNOSIS:  ?A. LYMPH NODE, LEFT CERVICAL; ULTRASOUND-GUIDED FINE NEEDLE ASPIRATION:  ?- DIAGNOSTIC OF MALIGNANCY.  ?- METASTATIC SQUAMOUS CELL CARCINOMA. ? ? ?# PVD- s/p stenting [on plavix; lasix- Dr.Shaukat Khan]; CKD Stage III; borderline DM  ?  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  ?10/01/2021 -  Chemotherapy  ? Patient is on Treatment Plan : carcinoma of unknow primary- Carboplatin / Paclitaxel + XRT q7d  ?   ? ? ? ?HISTORY OF PRESENTING ILLNESS: Ambulating independently.  Alone.  ? ?[name] 66 y.o.  male smoker with left neck squamous cell carcinoma -of unknown primary is here for consideration of cycle 1 of carbo-Taxol.  He starts concurrent radiation next week.  He underwent tonsillectomy in the interim to help narrow field of radiation.  Tolerated well.  Denies complaints today.  He is eager to press forward with treatment ? ? ?Review of Systems  ?Constitutional:  Positive for malaise/fatigue. Negative for chills, diaphoresis, fever and weight loss.  ?HENT:  Negative for nosebleeds and sore throat.   ?Eyes:  Negative  for double vision.  ?Respiratory:  Negative for cough, hemoptysis, sputum production, shortness of breath and wheezing.   ?Cardiovascular:  Negative for chest pain, palpitations, orthopnea and leg swelling.  ?Gastrointestinal:  Negative for abdominal pain, blood in stool, constipation, diarrhea, heartburn, melena, nausea and vomiting.  ?Genitourinary:  Negative for dysuria, frequency and urgency.  ?Musculoskeletal:  Positive for back pain and joint pain.  ?Skin: Negative.  Negative for itching and rash.  ?Neurological:  Negative for dizziness, tingling, focal weakness, weakness and headaches.  ?Endo/Heme/Allergies:  Does not bruise/bleed easily.  ?Psychiatric/Behavioral:  Negative for depression. The patient is not nervous/anxious and does not have insomnia.    ? ?MEDICAL HISTORY:  ?Past Medical History:  ?Diagnosis Date  ? Anxiety   ? a.) on BZO therapy  ? Aortic atherosclerosis ([place])   ? Arthritis   ? knees, lower back  ? Asthma   ? Cardiomyopathy ([place])   ? a.) TTE 03/24/2004: EF 12%; sev LV dilation; severe global HK. b.) TTE 05/05/2011: EF 26%; LA/LV mod dilated; mild MR. c.) TTE 12/15/2020: EF <20%; mod-sev LV dilation; mod LA dilation; mild MR; G3DD.  ? Carotid artery stenosis   ? CHF (congestive heart failure) ([place])   ? Chronic back pain   ? Chronic pain syndrome   ? CKD (chronic kidney disease), stage III ([place])   ? Complication of anesthesia   ? a.) difficulty maintaining anesthesia due to chronic opioid therapy; taking scheduled opioids QID  ? COPD (chronic obstructive pulmonary disease) ([place])   ? Coronary artery disease   ? a.) LHC 04/04/2002 -> norm coronary  anatomy; EF 20%.  b.)  Coronary CTA 07/28/2012: Ca score 143.4.  LAD, LCx, and RCA with no significant disease. c.) Coronary CTA 04/22/2015: Ca score 194.2; mild pLAD and LCx disease.  ? Diverticulitis   ? a.) s/p bowel resection  ? GERD (gastroesophageal reflux disease)   ? Gout   ? Hepatitis C virus infection without hepatic coma 02/2016  ? a.)  genotype 1a;  Tx'd with Ledipasvir / Sofosbuvir course  ? History of ETOH abuse   ? History of kidney stones   ? HLD (hyperlipidemia)   ? Hypertension   ? LBBB (left bundle branch block)   ? Long term current use of antithrombotics/antiplatelets   ? a.) clopidogrel  ? Long term prescription benzodiazepine use   ? Long term prescription opiate use   ? Metastatic squamous cell carcinoma involving lymph node with unknown primary site [facility]) 08/14/2021  ? a.) FNA LEFT cervical lymph node 08/14/2021 -- (+) for metastatic squamous cell carcinoma; primary unknown. b.) PET CT 08/28/2021: LEFT cervical LAD --> 0.9 cm level 2 node (SUV 4.0), 1.2 cm level 3 node (SUV 5.2), smaller FDG avid cervical nodes noted, but too small to characterize.  ? Microcytic anemia   ? Prediabetes   ? PVD (peripheral vascular disease) with claudication ([place])   ? a.) 10/2019 --> s/p mechanical thrombectomy, PTCA, stent placement to RIGHT SFA and popliteal arteries  ? SBO (small bowel obstruction) ([place]) 2007  ? a.) x 2  ? Symptomatic bradycardia   ? ? ?SURGICAL HISTORY: ?Past Surgical History:  ?Procedure Laterality Date  ? BACK SURGERY    ? CHOLECYSTECTOMY    ? COLON RESECTION  2007  ? due to diverticulitis  ? COLONOSCOPY WITH PROPOFOL N/A 03/29/2017  ? Procedure: COLONOSCOPY WITH PROPOFOL;  Surgeon: Lucilla Lame, MD;  Location: [facility] ENDOSCOPY;  Service: Endoscopy;  Laterality: N/A;  ? CYSTOSCOPY/URETEROSCOPY/HOLMIUM LASER/STENT PLACEMENT Left 07/16/2019  ? Procedure: CYSTOSCOPY/URETEROSCOPY/HOLMIUM LASER/STENT Exchange;  Surgeon: Hollice Espy, MD;  Location: [facility] ORS;  Service: Urology;  Laterality: Left;  ? ESOPHAGOGASTRODUODENOSCOPY (EGD) WITH PROPOFOL N/A 03/29/2017  ? Procedure: ESOPHAGOGASTRODUODENOSCOPY (EGD) WITH PROPOFOL;  Surgeon: Lucilla Lame, MD;  Location: [facility] ENDOSCOPY;  Service: Endoscopy;  Laterality: N/A;  ? FINGER SURGERY  2007  ? cut finger off with saw and was reattached  ? HERNIA REPAIR    ? HOLMIUM LASER APPLICATION N/A  03/54/6568  ? Procedure: HOLMIUM LASER APPLICATION;  Surgeon: Hollice Espy, MD;  Location: [facility] ORS;  Service: Urology;  Laterality: N/A;  ? IR IMAGING GUIDED PORT INSERTION  09/09/2021  ? IR NEPHROSTOMY PLACEMENT LEFT  05/28/2019  ? KIDNEY SURGERY  2021  ? KNEE ARTHROSCOPY Bilateral   ? LARYNGOSCOPY Bilateral 09/23/2021  ? Procedure: DIRECT LARYNGOSCOPY WITH BIOPSIES;  Surgeon: Clyde Canterbury, MD;  Location: [facility] ORS;  Service: ENT;  Laterality: Bilateral;  ? LEFT HEART CATH AND CORONARY ANGIOGRAPHY Left 04/04/2002  ? Procedure: LEFT HEART CATH AND CORONARY ANGIOGRAPHY; Location: [place]; Surgeon: Bartholome Bill, MD  ? LOWER EXTREMITY ANGIOGRAPHY Right 11/09/2017  ? Procedure: LOWER EXTREMITY ANGIOGRAPHY;  Surgeon: Katha Cabal, MD;  Location: [place] CV LAB;  Service: Cardiovascular;  Laterality: Right;  ? LOWER EXTREMITY ANGIOGRAPHY Right 11/21/2018  ? Procedure: LOWER EXTREMITY ANGIOGRAPHY;  Surgeon: Katha Cabal, MD;  Location: [place] CV LAB;  Service: Cardiovascular;  Laterality: Right;  ? LOWER EXTREMITY ANGIOGRAPHY Right 09/18/2019  ? Procedure: LOWER EXTREMITY ANGIOGRAPHY;  Surgeon: Katha Cabal, MD;  Location: [place] CV LAB;  Service: Cardiovascular;  Laterality: Right;  ? NEPHROLITHOTOMY Left 05/28/2019  ? Procedure: NEPHROLITHOTOMY PERCUTANEOUS;  Surgeon: Hollice Espy, MD;  Location: [facility] ORS;  Service: Urology;  Laterality: Left;  ? SPINAL FUSION    ? TONSILLECTOMY AND ADENOIDECTOMY Left 09/23/2021  ? Procedure: TONSILLECTOMY AND ADENOIDECTOMY;  Surgeon: Clyde Canterbury, MD;  Location: [facility] ORS;  Service: ENT;  Laterality: Left;  ? ? ?SOCIAL HISTORY: ?Social History  ? ?Socioeconomic History  ? Marital status: Married  ?  Spouse name: Vaughan Basta  ? Number of children: 12  ? Years of education: Not on file  ? Highest education level: Not on file  ?Occupational History  ? Occupation: disabled  ?Tobacco Use  ? Smoking status: Every Day  ?  Packs/day: 0.50  ?  Years: 25.00  ?   Pack years: 12.50  ?  Types: Cigarettes  ? Smokeless tobacco: Never  ?Vaping Use  ? Vaping Use: Never used  ?Substance and Sexual Activity  ? Alcohol use: No  ? Drug use: No  ? Sexual activity: Not Currently

## 2021-10-01 NOTE — Progress Notes (Signed)
Creatinine 1.67. Per Tawni Carnes RN per Beckey Rutter NP okay to proceed with treatment, pt to also receive 1 liter of fluids.  ? ?

## 2021-10-05 DIAGNOSIS — Z510 Encounter for antineoplastic radiation therapy: Secondary | ICD-10-CM | POA: Diagnosis not present

## 2021-10-06 NOTE — Progress Notes (Signed)
?[name]  ?[phone] [phone] ? ?ID: [name] OB: 1956-06-25  MR#: [id number]  [id number]#:[id number] ? ?Patient Care Team: ?Perrin Maltese, MD as PCP - General (Internal Medicine) ?Cammie Sickle, MD as Consulting Physician (Hematology and Oncology) ? ?CHIEF COMPLAINT: Squamous cell carcinoma of head and neck. ? ?INTERVAL HISTORY: Patient returns to clinic today for further evaluation and consideration of cycle 2 of weekly carboplatin and Taxol along with his daily XRT.  He tolerated his first infusion well without significant side effects.  He currently feels well and is asymptomatic.  He has no neurologic complaints.  He denies any recent fevers or illnesses.  He has a good appetite and denies weight loss.  He has no chest pain, shortness of breath, cough, or.  He denies any nausea, vomiting, constipation, or diarrhea.  He has no urinary complaints.  Patient feels at his baseline and offers no specific complaints today. ? ?REVIEW OF SYSTEMS:   ?Review of Systems  ?Constitutional: Negative.  Negative for fever, malaise/fatigue and weight loss.  ?Respiratory: Negative.  Negative for cough, hemoptysis and shortness of breath.   ?Cardiovascular: Negative.  Negative for chest pain and leg swelling.  ?Gastrointestinal: Negative.  Negative for abdominal pain.  ?Genitourinary: Negative.  Negative for dysuria.  ?Musculoskeletal: Negative.  Negative for back pain and neck pain.  ?Skin: Negative.  Negative for rash.  ?Neurological: Negative.  Negative for dizziness, focal weakness, weakness and headaches.  ?Psychiatric/Behavioral: Negative.  The patient is not nervous/anxious.   ? ?As per HPI. Otherwise, a complete review of systems is negative. ? ?PAST MEDICAL HISTORY: ?Past Medical History:  ?Diagnosis Date  ? Anxiety   ? a.) on BZO therapy  ? Aortic atherosclerosis ([place])   ? Arthritis   ? knees, lower back  ? Asthma   ? Cardiomyopathy ([place])   ? a.) TTE 03/24/2004: EF  12%; sev LV dilation; severe global HK. b.) TTE 05/05/2011: EF 26%; LA/LV mod dilated; mild MR. c.) TTE 12/15/2020: EF <20%; mod-sev LV dilation; mod LA dilation; mild MR; G3DD.  ? Carotid artery stenosis   ? CHF (congestive heart failure) ([place])   ? Chronic back pain   ? Chronic pain syndrome   ? CKD (chronic kidney disease), stage III ([place])   ? Complication of anesthesia   ? a.) difficulty maintaining anesthesia due to chronic opioid therapy; taking scheduled opioids QID  ? COPD (chronic obstructive pulmonary disease) ([place])   ? Coronary artery disease   ? a.) LHC 04/04/2002 -> norm coronary anatomy; EF 20%.  b.)  Coronary CTA 07/28/2012: Ca score 143.4.  LAD, LCx, and RCA with no significant disease. c.) Coronary CTA 04/22/2015: Ca score 194.2; mild pLAD and LCx disease.  ? Diverticulitis   ? a.) s/p bowel resection  ? GERD (gastroesophageal reflux disease)   ? Gout   ? Hepatitis C virus infection without hepatic coma 02/2016  ? a.) genotype 1a;  Tx'd with Ledipasvir / Sofosbuvir course  ? History of ETOH abuse   ? History of kidney stones   ? HLD (hyperlipidemia)   ? Hypertension   ? LBBB (left bundle branch block)   ? Long term current use of antithrombotics/antiplatelets   ? a.) clopidogrel  ? Long term prescription benzodiazepine use   ? Long term prescription opiate use   ? Metastatic squamous cell carcinoma involving lymph node with unknown primary site [facility]) 08/14/2021  ? a.) FNA LEFT cervical lymph node 08/14/2021 -- (+) for metastatic  squamous cell carcinoma; primary unknown. b.) PET CT 08/28/2021: LEFT cervical LAD --> 0.9 cm level 2 node (SUV 4.0), 1.2 cm level 3 node (SUV 5.2), smaller FDG avid cervical nodes noted, but too small to characterize.  ? Microcytic anemia   ? Prediabetes   ? PVD (peripheral vascular disease) with claudication ([place])   ? a.) 10/2019 --> s/p mechanical thrombectomy, PTCA, stent placement to RIGHT SFA and popliteal arteries  ? SBO (small bowel obstruction) ([place]) 2007  ? a.) x 2   ? Symptomatic bradycardia   ? ? ?PAST SURGICAL HISTORY: ?Past Surgical History:  ?Procedure Laterality Date  ? BACK SURGERY    ? CHOLECYSTECTOMY    ? COLON RESECTION  2007  ? due to diverticulitis  ? COLONOSCOPY WITH PROPOFOL N/A 03/29/2017  ? Procedure: COLONOSCOPY WITH PROPOFOL;  Surgeon: Lucilla Lame, MD;  Location: [facility] ENDOSCOPY;  Service: Endoscopy;  Laterality: N/A;  ? CYSTOSCOPY/URETEROSCOPY/HOLMIUM LASER/STENT PLACEMENT Left 07/16/2019  ? Procedure: CYSTOSCOPY/URETEROSCOPY/HOLMIUM LASER/STENT Exchange;  Surgeon: Hollice Espy, MD;  Location: [facility] ORS;  Service: Urology;  Laterality: Left;  ? ESOPHAGOGASTRODUODENOSCOPY (EGD) WITH PROPOFOL N/A 03/29/2017  ? Procedure: ESOPHAGOGASTRODUODENOSCOPY (EGD) WITH PROPOFOL;  Surgeon: Lucilla Lame, MD;  Location: [facility] ENDOSCOPY;  Service: Endoscopy;  Laterality: N/A;  ? FINGER SURGERY  2007  ? cut finger off with saw and was reattached  ? HERNIA REPAIR    ? HOLMIUM LASER APPLICATION N/A 93/23/5573  ? Procedure: HOLMIUM LASER APPLICATION;  Surgeon: Hollice Espy, MD;  Location: [facility] ORS;  Service: Urology;  Laterality: N/A;  ? IR IMAGING GUIDED PORT INSERTION  09/09/2021  ? IR NEPHROSTOMY PLACEMENT LEFT  05/28/2019  ? KIDNEY SURGERY  2021  ? KNEE ARTHROSCOPY Bilateral   ? LARYNGOSCOPY Bilateral 09/23/2021  ? Procedure: DIRECT LARYNGOSCOPY WITH BIOPSIES;  Surgeon: Clyde Canterbury, MD;  Location: [facility] ORS;  Service: ENT;  Laterality: Bilateral;  ? LEFT HEART CATH AND CORONARY ANGIOGRAPHY Left 04/04/2002  ? Procedure: LEFT HEART CATH AND CORONARY ANGIOGRAPHY; Location: [place]; Surgeon: Bartholome Bill, MD  ? LOWER EXTREMITY ANGIOGRAPHY Right 11/09/2017  ? Procedure: LOWER EXTREMITY ANGIOGRAPHY;  Surgeon: Katha Cabal, MD;  Location: [place] CV LAB;  Service: Cardiovascular;  Laterality: Right;  ? LOWER EXTREMITY ANGIOGRAPHY Right 11/21/2018  ? Procedure: LOWER EXTREMITY ANGIOGRAPHY;  Surgeon: Katha Cabal, MD;  Location: [place] CV LAB;  Service:  Cardiovascular;  Laterality: Right;  ? LOWER EXTREMITY ANGIOGRAPHY Right 09/18/2019  ? Procedure: LOWER EXTREMITY ANGIOGRAPHY;  Surgeon: Katha Cabal, MD;  Location: [place] CV LAB;  Service: Cardiovascular;  Laterality: Right;  ? NEPHROLITHOTOMY Left 05/28/2019  ? Procedure: NEPHROLITHOTOMY PERCUTANEOUS;  Surgeon: Hollice Espy, MD;  Location: [facility] ORS;  Service: Urology;  Laterality: Left;  ? SPINAL FUSION    ? TONSILLECTOMY AND ADENOIDECTOMY Left 09/23/2021  ? Procedure: TONSILLECTOMY AND ADENOIDECTOMY;  Surgeon: Clyde Canterbury, MD;  Location: [facility] ORS;  Service: ENT;  Laterality: Left;  ? ? ?FAMILY HISTORY: ?Family History  ?Problem Relation Age of Onset  ? COPD Mother   ? ? ?ADVANCED DIRECTIVES (Y/N):  N ? ?HEALTH MAINTENANCE: ?Social History  ? ?Tobacco Use  ? Smoking status: Every Day  ?  Packs/day: 0.50  ?  Years: 25.00  ?  Pack years: 12.50  ?  Types: Cigarettes  ? Smokeless tobacco: Never  ?Vaping Use  ? Vaping Use: Never used  ?Substance Use Topics  ? Alcohol use: No  ? Drug use: No  ? ? ? Colonoscopy: ? PAP: ? Bone density: ? Lipid  panel: ? ?Allergies  ?Allergen Reactions  ? Codeine Itching  ? Dilaudid [Hydromorphone] Other (See Comments)  ?  Severe headache  ? ? ?Current Outpatient Medications  ?Medication Sig Dispense Refill  ? albuterol (VENTOLIN HFA) 108 (90 Base) MCG/ACT inhaler Inhale 2 puffs into the lungs every 6 (six) hours as needed for wheezing or shortness of breath.    ? ALPRAZolam (XANAX) 1 MG tablet Take 1 mg by mouth 3 (three) times daily.  5  ? carvedilol (COREG) 25 MG tablet Take 25 mg by mouth 2 (two) times daily.     ? ENTRESTO 49-51 MG Take 1 tablet by mouth 2 (two) times daily.    ? FARXIGA 10 MG TABS tablet Take 10 mg by mouth daily.    ? fluticasone (FLONASE) 50 MCG/ACT nasal spray Place 1 spray into both nostrils daily as needed for allergies.     ? Fluticasone-Umeclidin-Vilant (TRELEGY ELLIPTA) 100-62.5-25 MCG/ACT AEPB Inhale into the lungs.    ? furosemide (LASIX)  40 MG tablet Take 40 mg by mouth.    ? HYDROcodone-acetaminophen (HYCET) 7.5-325 mg/15 ml solution 10-15 cc PO every 4-6 hours as needed for pain 300 mL 0  ? ipratropium-albuterol (DUONEB) 0.5-2.5 (3) MG/3ML

## 2021-10-07 ENCOUNTER — Ambulatory Visit: Admission: RE | Admit: 2021-10-07 | Payer: Medicare Other | Source: Ambulatory Visit

## 2021-10-08 ENCOUNTER — Inpatient Hospital Stay: Payer: Medicare Other

## 2021-10-08 ENCOUNTER — Ambulatory Visit
Admission: RE | Admit: 2021-10-08 | Discharge: 2021-10-08 | Disposition: A | Discharge status: Home / Self Care | Payer: Medicare Other | Source: Ambulatory Visit | Attending: Radiation Oncology | Admitting: Radiation Oncology

## 2021-10-08 ENCOUNTER — Inpatient Hospital Stay: Payer: Medicare Other | Admitting: Oncology

## 2021-10-08 VITALS — BP 95/65 | HR 62 | Temp 95.3°F | Resp 16 | Wt 146.7 lb

## 2021-10-08 DIAGNOSIS — C760 Malignant neoplasm of head, face and neck: Secondary | ICD-10-CM | POA: Diagnosis not present

## 2021-10-08 DIAGNOSIS — C7989 Secondary malignant neoplasm of other specified sites: Secondary | ICD-10-CM

## 2021-10-08 DIAGNOSIS — Z510 Encounter for antineoplastic radiation therapy: Secondary | ICD-10-CM | POA: Diagnosis not present

## 2021-10-08 LAB — COMPREHENSIVE METABOLIC PANEL
ALT: 23 U/L (ref 0–44)
AST: 52 U/L — ABNORMAL HIGH (ref 15–41)
Albumin: 3.4 g/dL — ABNORMAL LOW (ref 3.5–5.0)
Alkaline Phosphatase: 72 U/L (ref 38–126)
Anion gap: 9 (ref 5–15)
BUN: 45 mg/dL — ABNORMAL HIGH (ref 8–23)
CO2: 27 mmol/L (ref 22–32)
Calcium: 8.7 mg/dL — ABNORMAL LOW (ref 8.9–10.3)
Chloride: 99 mmol/L (ref 98–111)
Creatinine, Ser: 1.48 mg/dL — ABNORMAL HIGH (ref 0.61–1.24)
GFR, Estimated: 53 mL/min — ABNORMAL LOW (ref >60)
Glucose, Bld: 118 mg/dL — ABNORMAL HIGH (ref 70–99)
Potassium: 4.7 mmol/L (ref 3.5–5.1)
Sodium: 135 mmol/L (ref 135–145)
Total Bilirubin: 1.7 mg/dL — ABNORMAL HIGH (ref 0.3–1.2)
Total Protein: 6.6 g/dL (ref 6.5–8.1)

## 2021-10-08 LAB — CBC WITH DIFFERENTIAL/PLATELET
Abs Immature Granulocytes: 0.14 10*3/uL — ABNORMAL HIGH (ref 0.00–0.07)
Basophils Absolute: 0 10*3/uL (ref 0.0–0.1)
Basophils Relative: 1 %
Eosinophils Absolute: 0.1 10*3/uL (ref 0.0–0.5)
Eosinophils Relative: 2 %
HCT: 39.3 % (ref 39.0–52.0)
Hemoglobin: 12.7 g/dL — ABNORMAL LOW (ref 13.0–17.0)
Immature Granulocytes: 3 %
Lymphocytes Relative: 8 %
Lymphs Abs: 0.3 10*3/uL — ABNORMAL LOW (ref 0.7–4.0)
MCH: 28.4 pg (ref 26.0–34.0)
MCHC: 32.3 g/dL (ref 30.0–36.0)
MCV: 87.9 fL (ref 80.0–100.0)
Monocytes Absolute: 0.1 10*3/uL (ref 0.1–1.0)
Monocytes Relative: 3 %
Neutro Abs: 3.4 10*3/uL (ref 1.7–7.7)
Neutrophils Relative %: 83 %
Platelets: 178 10*3/uL (ref 150–400)
RBC: 4.47 MIL/uL (ref 4.22–5.81)
RDW: 14.7 % (ref 11.5–15.5)
WBC: 4.1 10*3/uL (ref 4.0–10.5)
nRBC: 0 % (ref 0.0–0.2)

## 2021-10-08 MED ORDER — HEPARIN SOD (PORK) LOCK FLUSH 100 UNIT/ML IV SOLN
500.0000 [IU] | Freq: Once | INTRAVENOUS | Status: AC | PRN
  Administered 2021-10-08: 500 [IU]
  Filled 2021-10-08: qty 5

## 2021-10-08 MED ORDER — DIPHENHYDRAMINE HCL 50 MG/ML IJ SOLN
50.0000 mg | Freq: Once | INTRAMUSCULAR | Status: AC
  Administered 2021-10-08: 50 mg via INTRAVENOUS

## 2021-10-08 MED ORDER — DIPHENHYDRAMINE HCL 50 MG/ML IJ SOLN
INTRAMUSCULAR | Status: AC
  Filled 2021-10-08: qty 1

## 2021-10-08 MED ORDER — SODIUM CHLORIDE 0.9% FLUSH
10.0000 mL | INTRAVENOUS | Status: DC | PRN
  Filled 2021-10-08: qty 10

## 2021-10-08 MED ORDER — SODIUM CHLORIDE 0.9 % IV SOLN
Freq: Once | INTRAVENOUS | Status: AC
  Filled 2021-10-08: qty 250

## 2021-10-08 MED ORDER — SODIUM CHLORIDE 0.9 % IV SOLN
45.0000 mg/m2 | Freq: Once | INTRAVENOUS | Status: AC
  Administered 2021-10-08: 78 mg via INTRAVENOUS
  Filled 2021-10-08: qty 13

## 2021-10-08 MED ORDER — SODIUM CHLORIDE 0.9 % IV SOLN
144.6000 mg | Freq: Once | INTRAVENOUS | Status: AC
  Administered 2021-10-08: 140 mg via INTRAVENOUS
  Filled 2021-10-08: qty 14

## 2021-10-08 MED ORDER — FAMOTIDINE IN NACL 20-0.9 MG/50ML-% IV SOLN
INTRAVENOUS | Status: AC
  Filled 2021-10-08: qty 50

## 2021-10-08 MED ORDER — FAMOTIDINE IN NACL 20-0.9 MG/50ML-% IV SOLN
20.0000 mg | Freq: Once | INTRAVENOUS | Status: AC
  Administered 2021-10-08: 20 mg via INTRAVENOUS

## 2021-10-08 MED ORDER — PALONOSETRON HCL INJECTION 0.25 MG/5ML
0.2500 mg | Freq: Once | INTRAVENOUS | Status: AC
  Administered 2021-10-08: 0.25 mg via INTRAVENOUS

## 2021-10-08 MED ORDER — PALONOSETRON HCL INJECTION 0.25 MG/5ML
INTRAVENOUS | Status: AC
  Filled 2021-10-08: qty 5

## 2021-10-08 MED ORDER — SODIUM CHLORIDE 0.9 % IV SOLN
10.0000 mg | Freq: Once | INTRAVENOUS | Status: AC
  Administered 2021-10-08: 10 mg via INTRAVENOUS
  Filled 2021-10-08: qty 10

## 2021-10-08 NOTE — Progress Notes (Signed)
Per Dr Grayland Ormond- ok to proceed with Bilirubin 1.7 ?

## 2021-10-08 NOTE — Addendum Note (Signed)
Addended by: Jennet Maduro B on: 10/08/2021 02:57 PM ? ? Modules accepted: Orders ? ?

## 2021-10-08 NOTE — Progress Notes (Signed)
Nutrition Follow-up: ? ?Patient with left neck squamous cell carcinoma of unknown primary (p16 +).  S/p tonsillectomy on 3/15. Patient  receiving carbo-taxol and radiation.  ? ?Met with patient during infusion.  Patient eating M&Ms during visit.  Says that he is eating well. Denies having any issues with swallowing, pain at this time.  Drinking boost shakes. Typically eating 3 meals a day.   ? ?Denies any nutrition impact symptoms ? ? ?Medications: reviewed ? ?Labs: glucose 118, BUN 45, creatinine 1.48 ? ?Anthropometrics:  ? ?Weight 146 lb 11.2 oz today ?147 lb 12.8 oz  ?144 lb on 3/1 ?UBW of 150-155 lb per patient a few months ago ? ?Wife reports fluid removed in June 2022 ? ? ?NUTRITION DIAGNOSIS: Predicted sub optimal energy intake ? ? ?INTERVENTION:  ?Recommend SLP evaluation due to possible radiation side effects ?Encouraged patient to continue shakes for added calories and protein. ?Encouraged patient to continue high calorie, high protein diet ?  ? ?MONITORING, EVALUATION, GOAL: weight trends, intake ? ? ?NEXT VISIT: Thursday, April 13 during infusion ? ?Anastacia Reinecke B. Zenia Resides, RD, LDN ?Registered Dietitian ?[phone] ? ? ?

## 2021-10-08 NOTE — Patient Instructions (Signed)
[facility] AT [place]  Discharge Instructions: ?Thank you for choosing [facility] to provide your oncology and hematology care.  ?If you have a lab appointment with the [facility], please go directly to the [facility] and check in at the registration area. ? ?Wear comfortable clothing and clothing appropriate for easy access to any Portacath or PICC line.  ? ?We strive to give you quality time with your provider. You may need to reschedule your appointment if you arrive late (15 or more minutes).  Arriving late affects you and other patients whose appointments are after yours.  Also, if you miss three or more appointments without notifying the office, you may be dismissed from the clinic at the provider?s discretion.    ?  ?For prescription refill requests, have your pharmacy contact our office and allow 72 hours for refills to be completed.   ? ?Today you received the following chemotherapy and/or immunotherapy agents TAXOL and CARBOPLATIN    ?  ?To help prevent nausea and vomiting after your treatment, we encourage you to take your nausea medication as directed. ? ?BELOW ARE SYMPTOMS THAT SHOULD BE REPORTED IMMEDIATELY: ?*FEVER GREATER THAN 100.4 F (38 ?C) OR HIGHER ?*CHILLS OR SWEATING ?*NAUSEA AND VOMITING THAT IS NOT CONTROLLED WITH YOUR NAUSEA MEDICATION ?*UNUSUAL SHORTNESS OF BREATH ?*UNUSUAL BRUISING OR BLEEDING ?*URINARY PROBLEMS (pain or burning when urinating, or frequent urination) ?*BOWEL PROBLEMS (unusual diarrhea, constipation, pain near the anus) ?TENDERNESS IN MOUTH AND THROAT WITH OR WITHOUT PRESENCE OF ULCERS (sore throat, sores in mouth, or a toothache) ?UNUSUAL RASH, SWELLING OR PAIN  ?UNUSUAL VAGINAL DISCHARGE OR ITCHING  ? ?Items with * indicate a potential emergency and should be followed up as soon as possible or go to the Emergency Department if any problems should occur. ? ?Please show the CHEMOTHERAPY ALERT CARD or IMMUNOTHERAPY ALERT CARD at  check-in to the Emergency Department and triage nurse. ? ?Should you have questions after your visit or need to cancel or reschedule your appointment, please contact [facility] [place] AT [place]  [phone] and follow the prompts.  Office hours are 8:00 a.m. to 4:30 p.m. Monday - Friday. Please note that voicemails left after 4:00 p.m. may not be returned until the following business day.  We are closed weekends and major holidays. You have access to a nurse at all times for urgent questions. Please call the main number to the clinic [phone] and follow the prompts. ? ?For any non-urgent questions, you may also contact your provider using MyChart. We now offer e-Visits for anyone 47 and older to request care online for non-urgent symptoms. For details visit [email or web address]. ?  ?Also download the MyChart app! Go to the app store, search "MyChart", open the app, select [facility], and log in with your MyChart username and password. ? ?Due to Covid, a mask is required upon entering the hospital/clinic. If you do not have a mask, one will be given to you upon arrival. For doctor visits, patients may have 1 support person aged 52 or older with them. For treatment visits, patients cannot have anyone with them due to current Covid guidelines and our immunocompromised population.  ? ?Paclitaxel injection ?What is this medication? ?PACLITAXEL (PAK li TAX el) is a chemotherapy drug. It targets fast dividing cells, like cancer cells, and causes these cells to die. This medicine is used to treat ovarian cancer, breast cancer, lung cancer, Kaposi's sarcoma, and other cancers. ?This medicine may be used for other  purposes; ask your health care provider or pharmacist if you have questions. ?COMMON BRAND NAME(S): Onxol, Taxol ?What should I tell my care team before I take this medication? ?They need to know if you have any of these conditions: ?history of irregular heartbeat ?liver disease ?low blood  counts, like low white cell, platelet, or red cell counts ?lung or breathing disease, like asthma ?tingling of the fingers or toes, or other nerve disorder ?an unusual or allergic reaction to paclitaxel, alcohol, polyoxyethylated castor oil, other chemotherapy, other medicines, foods, dyes, or preservatives ?pregnant or trying to get pregnant ?breast-feeding ?How should I use this medication? ?This drug is given as an infusion into a vein. It is administered in a hospital or clinic by a specially trained health care professional. ?Talk to your pediatrician regarding the use of this medicine in children. Special care may be needed. ?Overdosage: If you think you have taken too much of this medicine contact a poison control center or emergency room at once. ?NOTE: This medicine is only for you. Do not share this medicine with others. ?What if I miss a dose? ?It is important not to miss your dose. Call your doctor or health care professional if you are unable to keep an appointment. ?What may interact with this medication? ?Do not take this medicine with any of the following medications: ?live virus vaccines ?This medicine may also interact with the following medications: ?antiviral medicines for hepatitis, HIV or AIDS ?certain antibiotics like erythromycin and clarithromycin ?certain medicines for fungal infections like ketoconazole and itraconazole ?certain medicines for seizures like carbamazepine, phenobarbital, phenytoin ?gemfibrozil ?nefazodone ?rifampin ?St. John's wort ?This list may not describe all possible interactions. Give your health care provider a list of all the medicines, herbs, non-prescription drugs, or dietary supplements you use. Also tell them if you smoke, drink alcohol, or use illegal drugs. Some items may interact with your medicine. ?What should I watch for while using this medication? ?Your condition will be monitored carefully while you are receiving this medicine. You will need important  blood work done while you are taking this medicine. ?This medicine can cause serious allergic reactions. To reduce your risk you will need to take other medicine(s) before treatment with this medicine. If you experience allergic reactions like skin rash, itching or hives, swelling of the face, lips, or tongue, tell your doctor or health care professional right away. ?In some cases, you may be given additional medicines to help with side effects. Follow all directions for their use. ?This drug may make you feel generally unwell. This is not uncommon, as chemotherapy can affect healthy cells as well as cancer cells. Report any side effects. Continue your course of treatment even though you feel ill unless your doctor tells you to stop. ?Call your doctor or health care professional for advice if you get a fever, chills or sore throat, or other symptoms of a cold or flu. Do not treat yourself. This drug decreases your body's ability to fight infections. Try to avoid being around people who are sick. ?This medicine may increase your risk to bruise or bleed. Call your doctor or health care professional if you notice any unusual bleeding. ?Be careful brushing and flossing your teeth or using a toothpick because you may get an infection or bleed more easily. If you have any dental work done, tell your dentist you are receiving this medicine. ?Avoid taking products that contain aspirin, acetaminophen, ibuprofen, naproxen, or ketoprofen unless instructed by your doctor. These medicines may  hide a fever. ?Do not become pregnant while taking this medicine. Women should inform their doctor if they wish to become pregnant or think they might be pregnant. There is a potential for serious side effects to an unborn child. Talk to your health care professional or pharmacist for more information. Do not breast-feed an infant while taking this medicine. ?Men are advised not to father a child while receiving this medicine. ?This product  may contain alcohol. Ask your pharmacist or healthcare provider if this medicine contains alcohol. Be sure to tell all healthcare providers you are taking this medicine. Certain medicines, like metronid

## 2021-10-09 ENCOUNTER — Ambulatory Visit
Admission: RE | Admit: 2021-10-09 | Discharge: 2021-10-09 | Disposition: A | Discharge status: Home / Self Care | Payer: Medicare Other | Source: Ambulatory Visit | Attending: Radiation Oncology | Admitting: Radiation Oncology

## 2021-10-09 ENCOUNTER — Encounter: Payer: Self-pay | Admitting: Internal Medicine

## 2021-10-09 DIAGNOSIS — Z510 Encounter for antineoplastic radiation therapy: Secondary | ICD-10-CM | POA: Diagnosis not present

## 2021-10-11 NOTE — Progress Notes (Signed)
?[facility]  ?[phone] [phone] ? ?ID: [name] OB: February 19, 1956  MR#: [id number]  FGH#:[id number] ? ?Patient Care Team: ?Perrin Maltese, MD as PCP - General (Internal Medicine) ?Cammie Sickle, MD as Consulting Physician (Hematology and Oncology) ? ?CHIEF COMPLAINT: Squamous cell carcinoma of head and neck. ? ?INTERVAL HISTORY: Patient returns to clinic today for further evaluation and consideration of cycle 3 of weekly carboplatinum and Taxol along with his daily XRT.  He noticed increased weakness and fatigue this week, but otherwise is tolerating his treatments well.  He has no neurologic complaints.  He denies any recent fevers or illnesses.  He has a good appetite and denies weight loss.  He has no chest pain, shortness of breath, cough, or.  He denies any nausea, vomiting, constipation, or diarrhea.  He has no urinary complaints.  Patient offers no further specific complaints today. ? ?REVIEW OF SYSTEMS:   ?Review of Systems  ?Constitutional:  Positive for malaise/fatigue. Negative for fever and weight loss.  ?Respiratory: Negative.  Negative for cough, hemoptysis and shortness of breath.   ?Cardiovascular: Negative.  Negative for chest pain and leg swelling.  ?Gastrointestinal: Negative.  Negative for abdominal pain.  ?Genitourinary: Negative.  Negative for dysuria.  ?Musculoskeletal: Negative.  Negative for back pain and neck pain.  ?Skin: Negative.  Negative for rash.  ?Neurological:  Positive for weakness. Negative for dizziness, focal weakness and headaches.  ?Psychiatric/Behavioral: Negative.  The patient is not nervous/anxious.   ? ?As per HPI. Otherwise, a complete review of systems is negative. ? ?PAST MEDICAL HISTORY: ?Past Medical History:  ?Diagnosis Date  ? Anxiety   ? a.) on BZO therapy  ? Aortic atherosclerosis ([place])   ? Arthritis   ? knees, lower back  ? Asthma   ? Cardiomyopathy ([place])   ? a.) TTE 03/24/2004: EF 12%; sev LV dilation;  severe global HK. b.) TTE 05/05/2011: EF 26%; LA/LV mod dilated; mild MR. c.) TTE 12/15/2020: EF <20%; mod-sev LV dilation; mod LA dilation; mild MR; G3DD.  ? Carotid artery stenosis   ? CHF (congestive heart failure) ([place])   ? Chronic back pain   ? Chronic pain syndrome   ? CKD (chronic kidney disease), stage III ([place])   ? Complication of anesthesia   ? a.) difficulty maintaining anesthesia due to chronic opioid therapy; taking scheduled opioids QID  ? COPD (chronic obstructive pulmonary disease) ([place])   ? Coronary artery disease   ? a.) LHC 04/04/2002 -> norm coronary anatomy; EF 20%.  b.)  Coronary CTA 07/28/2012: Ca score 143.4.  LAD, LCx, and RCA with no significant disease. c.) Coronary CTA 04/22/2015: Ca score 194.2; mild pLAD and LCx disease.  ? Diverticulitis   ? a.) s/p bowel resection  ? GERD (gastroesophageal reflux disease)   ? Gout   ? Hepatitis C virus infection without hepatic coma 02/2016  ? a.) genotype 1a;  Tx'd with Ledipasvir / Sofosbuvir course  ? History of ETOH abuse   ? History of kidney stones   ? HLD (hyperlipidemia)   ? Hypertension   ? LBBB (left bundle branch block)   ? Long term current use of antithrombotics/antiplatelets   ? a.) clopidogrel  ? Long term prescription benzodiazepine use   ? Long term prescription opiate use   ? 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 a.) FNA LEFT cervical lymph node 08/14/2021 -- (+) for metastatic squamous cell carcinoma; primary unknown.  b.) PET CT 08/28/2021: LEFT cervical LAD --> 0.9 cm level 2 node (SUV 4.0), 1.2 cm level 3 node (SUV 5.2), smaller FDG avid cervical nodes noted, but too small to characterize.  ? Microcytic anemia   ? Prediabetes   ? PVD (peripheral vascular disease) with claudication ([place])   ? a.) 10/2019 --> s/p mechanical thrombectomy, PTCA, stent placement to RIGHT SFA and popliteal arteries  ? SBO (small bowel obstruction) ([place]) 2007  ? a.) x 2  ? Symptomatic  bradycardia   ? ? ?PAST SURGICAL HISTORY: ?Past Surgical History:  ?Procedure Laterality Date  ? BACK SURGERY    ? CHOLECYSTECTOMY    ? COLON RESECTION  2007  ? due to diverticulitis  ? COLONOSCOPY WITH PROPOFOL N/A 03/29/2017  ? Procedure: COLONOSCOPY WITH PROPOFOL;  Surgeon: Lucilla Lame, MD;  Location[facility] ENDOSCOPY;  Service: Endoscopy;  Laterality: N/A;  ? CYSTOSCOPY/URETEROSCOPY/HOLMIUM LASER/STENT PLACEMENT Left 07/16/2019  ? Procedure: CYSTOSCOPY/URETEROSCOPY/HOLMIUM LASER/STENT Exchange;  Surgeon: Hollice Espy, MD;  Location: [facility] ORS;  Service: Urology;  Laterality: Left;  ? ESOPHAGOGASTRODUODENOSCOPY (EGD) WITH PROPOFOL N/A 03/29/2017  ? Procedure: ESOPHAGOGASTRODUODENOSCOPY (EGD) WITH PROPOFOL;  Surgeon: Lucilla Lame, MD;  Location: [facility] ENDOSCOPY;  Service: Endoscopy;  Laterality: N/A;  ? FINGER SURGERY  2007  ? cut finger off with saw and was reattached  ? HERNIA REPAIR    ? HOLMIUM LASER APPLICATION N/A 02/54/2706  ? Procedure: HOLMIUM LASER APPLICATION;  Surgeon: Hollice Espy, MD;  Location: [facility] ORS;  Service: Urology;  Laterality: N/A;  ? IR IMAGING GUIDED PORT INSERTION  09/09/2021  ? IR NEPHROSTOMY PLACEMENT LEFT  05/28/2019  ? KIDNEY SURGERY  2021  ? KNEE ARTHROSCOPY Bilateral   ? LARYNGOSCOPY Bilateral 09/23/2021  ? Procedure: DIRECT LARYNGOSCOPY WITH BIOPSIES;  Surgeon: Clyde Canterbury, MD;  Location: [facility] ORS;  Service: ENT;  Laterality: Bilateral;  ? LEFT HEART CATH AND CORONARY ANGIOGRAPHY Left 04/04/2002  ? Procedure: LEFT HEART CATH AND CORONARY ANGIOGRAPHY; Location: [facility]; Surgeon: Bartholome Bill, MD  ? LOWER EXTREMITY ANGIOGRAPHY Right 11/09/2017  ? Procedure: LOWER EXTREMITY ANGIOGRAPHY;  Surgeon: Katha Cabal, MD;  Location: [facility] CV LAB;  Service: Cardiovascular;  Laterality: Right;  ? LOWER EXTREMITY ANGIOGRAPHY Right 11/21/2018  ? Procedure: LOWER EXTREMITY ANGIOGRAPHY;  Surgeon: Katha Cabal, MD;  Location: [place] CV LAB;  Service: Cardiovascular;   Laterality: Right;  ? LOWER EXTREMITY ANGIOGRAPHY Right 09/18/2019  ? Procedure: LOWER EXTREMITY ANGIOGRAPHY;  Surgeon: Katha Cabal, MD;  Location: [facility] CV LAB;  Service: Cardiovascular;  Laterality: Right;  ? NEPHROLITHOTOMY Left 05/28/2019  ? Procedure: NEPHROLITHOTOMY PERCUTANEOUS;  Surgeon: Hollice Espy, MD;  Location: [facility] ORS;  Service: Urology;  Laterality: Left;  ? SPINAL FUSION    ? TONSILLECTOMY AND ADENOIDECTOMY Left 09/23/2021  ? Procedure: TONSILLECTOMY AND ADENOIDECTOMY;  Surgeon: Clyde Canterbury, MD;  Location: [facility] ORS;  Service: ENT;  Laterality: Left;  ? ? ?FAMILY HISTORY: ?Family History  ?Problem Relation Age of Onset  ? COPD Mother   ? ? ?ADVANCED DIRECTIVES (Y/N):  N ? ?HEALTH MAINTENANCE: ?Social History  ? ?Tobacco Use  ? Smoking status: Every Day  ?  Packs/day: 0.50  ?  Years: 25.00  ?  Pack years: 12.50  ?  Types: Cigarettes  ? Smokeless tobacco: Never  ?Vaping Use  ? Vaping Use: Never used  ?Substance Use Topics  ? Alcohol use: No  ? Drug use: No  ? ? ? Colonoscopy: ? PAP: ? Bone density: ? Lipid panel: ? ?Allergies  ?Allergen  Reactions  ? Codeine Itching  ? Dilaudid [Hydromorphone] Other (See Comments)  ?  Severe headache  ? ? ?Current Outpatient Medications  ?Medication Sig Dispense Refill  ? albuterol (VENTOLIN HFA) 108 (90 Base) MCG/ACT inhaler Inhale 2 puffs into the lungs every 6 (six) hours as needed for wheezing or shortness of breath.    ? ALPRAZolam (XANAX) 1 MG tablet Take 1 mg by mouth 3 (three) times daily.  5  ? carvedilol (COREG) 25 MG tablet Take 25 mg by mouth 2 (two) times daily.     ? ENTRESTO 49-51 MG Take 1 tablet by mouth 2 (two) times daily.    ? FARXIGA 10 MG TABS tablet Take 10 mg by mouth daily.    ? fluticasone (FLONASE) 50 MCG/ACT nasal spray Place 1 spray into both nostrils daily as needed for allergies.     ? Fluticasone-Umeclidin-Vilant (TRELEGY ELLIPTA) 100-62.5-25 MCG/ACT AEPB Inhale into the lungs.    ? furosemide (LASIX) 40 MG tablet Take  40 mg by mouth.    ? HYDROcodone-acetaminophen (HYCET) 7.5-325 mg/15 ml solution 10-15 cc PO every 4-6 hours as needed for pain 300 mL 0  ? ipratropium-albuterol (DUONEB) 0.5-2.5 (3) MG/3ML SOLN Take 3 mLs by nebul

## 2021-10-12 ENCOUNTER — Ambulatory Visit: Payer: Medicare Other

## 2021-10-12 ENCOUNTER — Ambulatory Visit
Admission: RE | Admit: 2021-10-12 | Discharge: 2021-10-12 | Disposition: A | Discharge status: Home / Self Care | Payer: Medicare Other | Source: Ambulatory Visit | Attending: Radiation Oncology | Admitting: Radiation Oncology

## 2021-10-12 DIAGNOSIS — F17210 Nicotine dependence, cigarettes, uncomplicated: Secondary | ICD-10-CM | POA: Diagnosis not present

## 2021-10-12 DIAGNOSIS — I2510 Atherosclerotic heart disease of native coronary artery without angina pectoris: Secondary | ICD-10-CM | POA: Insufficient documentation

## 2021-10-12 DIAGNOSIS — C801 Malignant (primary) neoplasm, unspecified: Secondary | ICD-10-CM | POA: Diagnosis not present

## 2021-10-12 DIAGNOSIS — N183 Chronic kidney disease, stage 3 unspecified: Secondary | ICD-10-CM | POA: Insufficient documentation

## 2021-10-12 DIAGNOSIS — I130 Hypertensive heart and chronic kidney disease with heart failure and stage 1 through stage 4 chronic kidney disease, or unspecified chronic kidney disease: Secondary | ICD-10-CM | POA: Insufficient documentation

## 2021-10-12 DIAGNOSIS — I739 Peripheral vascular disease, unspecified: Secondary | ICD-10-CM | POA: Diagnosis not present

## 2021-10-12 DIAGNOSIS — Z510 Encounter for antineoplastic radiation therapy: Secondary | ICD-10-CM | POA: Insufficient documentation

## 2021-10-12 DIAGNOSIS — I509 Heart failure, unspecified: Secondary | ICD-10-CM | POA: Insufficient documentation

## 2021-10-12 DIAGNOSIS — C770 Secondary and unspecified malignant neoplasm of lymph nodes of head, face and neck: Secondary | ICD-10-CM | POA: Diagnosis not present

## 2021-10-12 DIAGNOSIS — J449 Chronic obstructive pulmonary disease, unspecified: Secondary | ICD-10-CM | POA: Diagnosis not present

## 2021-10-13 ENCOUNTER — Ambulatory Visit
Admission: RE | Admit: 2021-10-13 | Discharge: 2021-10-13 | Disposition: A | Discharge status: Home / Self Care | Payer: Medicare Other | Source: Ambulatory Visit | Attending: Radiation Oncology | Admitting: Radiation Oncology

## 2021-10-13 ENCOUNTER — Telehealth: Payer: Self-pay | Admitting: Speech Pathology

## 2021-10-13 DIAGNOSIS — Z510 Encounter for antineoplastic radiation therapy: Secondary | ICD-10-CM | POA: Diagnosis not present

## 2021-10-13 NOTE — Telephone Encounter (Signed)
I received consult for Mr Dehaas and called his home number.  ? ?His wife answered the phone and politely declined any appointments with ST services. Despite extensive education on this resource, she declined.  ? ?I am available should she decide to seek these services in the future.  ? ?Daylen Hack B. Rutherford Nail, M.S., CCC-SLP, CBIS ?Speech-Language Pathologist ?Rehabilitation Services ?Office [phone] ? ?

## 2021-10-14 ENCOUNTER — Ambulatory Visit
Admission: RE | Admit: 2021-10-14 | Discharge: 2021-10-14 | Disposition: A | Discharge status: Home / Self Care | Payer: Medicare Other | Source: Ambulatory Visit | Attending: Radiation Oncology | Admitting: Radiation Oncology

## 2021-10-14 DIAGNOSIS — Z510 Encounter for antineoplastic radiation therapy: Secondary | ICD-10-CM | POA: Diagnosis not present

## 2021-10-15 ENCOUNTER — Inpatient Hospital Stay: Payer: Medicare Other

## 2021-10-15 ENCOUNTER — Inpatient Hospital Stay: Payer: Medicare Other | Admitting: Oncology

## 2021-10-15 ENCOUNTER — Ambulatory Visit
Admission: RE | Admit: 2021-10-15 | Discharge: 2021-10-15 | Disposition: A | Discharge status: Home / Self Care | Payer: Medicare Other | Source: Ambulatory Visit | Attending: Radiation Oncology | Admitting: Radiation Oncology

## 2021-10-15 VITALS — BP 90/63 | HR 70 | Temp 96.4°F | Resp 16 | Ht 67.0 in | Wt 147.2 lb

## 2021-10-15 DIAGNOSIS — C770 Secondary and unspecified malignant neoplasm of lymph nodes of head, face and neck: Secondary | ICD-10-CM | POA: Insufficient documentation

## 2021-10-15 DIAGNOSIS — I2510 Atherosclerotic heart disease of native coronary artery without angina pectoris: Secondary | ICD-10-CM | POA: Insufficient documentation

## 2021-10-15 DIAGNOSIS — I129 Hypertensive chronic kidney disease with stage 1 through stage 4 chronic kidney disease, or unspecified chronic kidney disease: Secondary | ICD-10-CM | POA: Insufficient documentation

## 2021-10-15 DIAGNOSIS — C760 Malignant neoplasm of head, face and neck: Secondary | ICD-10-CM

## 2021-10-15 DIAGNOSIS — D709 Neutropenia, unspecified: Secondary | ICD-10-CM | POA: Insufficient documentation

## 2021-10-15 DIAGNOSIS — N183 Chronic kidney disease, stage 3 unspecified: Secondary | ICD-10-CM | POA: Insufficient documentation

## 2021-10-15 DIAGNOSIS — Z5111 Encounter for antineoplastic chemotherapy: Secondary | ICD-10-CM | POA: Insufficient documentation

## 2021-10-15 DIAGNOSIS — E871 Hypo-osmolality and hyponatremia: Secondary | ICD-10-CM | POA: Insufficient documentation

## 2021-10-15 DIAGNOSIS — Z510 Encounter for antineoplastic radiation therapy: Secondary | ICD-10-CM | POA: Diagnosis not present

## 2021-10-15 DIAGNOSIS — C801 Malignant (primary) neoplasm, unspecified: Secondary | ICD-10-CM | POA: Insufficient documentation

## 2021-10-15 DIAGNOSIS — J449 Chronic obstructive pulmonary disease, unspecified: Secondary | ICD-10-CM | POA: Insufficient documentation

## 2021-10-15 DIAGNOSIS — C7989 Secondary malignant neoplasm of other specified sites: Secondary | ICD-10-CM

## 2021-10-15 DIAGNOSIS — F17210 Nicotine dependence, cigarettes, uncomplicated: Secondary | ICD-10-CM | POA: Insufficient documentation

## 2021-10-15 LAB — COMPREHENSIVE METABOLIC PANEL
ALT: 30 U/L (ref 0–44)
AST: 74 U/L — ABNORMAL HIGH (ref 15–41)
Albumin: 3.4 g/dL — ABNORMAL LOW (ref 3.5–5.0)
Alkaline Phosphatase: 73 U/L (ref 38–126)
Anion gap: 8 (ref 5–15)
BUN: 39 mg/dL — ABNORMAL HIGH (ref 8–23)
CO2: 24 mmol/L (ref 22–32)
Calcium: 8.3 mg/dL — ABNORMAL LOW (ref 8.9–10.3)
Chloride: 97 mmol/L — ABNORMAL LOW (ref 98–111)
Creatinine, Ser: 1.27 mg/dL — ABNORMAL HIGH (ref 0.61–1.24)
GFR, Estimated: >60 mL/min (ref >60)
Glucose, Bld: 119 mg/dL — ABNORMAL HIGH (ref 70–99)
Potassium: 4.9 mmol/L (ref 3.5–5.1)
Sodium: 129 mmol/L — ABNORMAL LOW (ref 135–145)
Total Bilirubin: 1.8 mg/dL — ABNORMAL HIGH (ref 0.3–1.2)
Total Protein: 6.2 g/dL — ABNORMAL LOW (ref 6.5–8.1)

## 2021-10-15 LAB — CBC WITH DIFFERENTIAL/PLATELET
Abs Immature Granulocytes: 0 10*3/uL (ref 0.00–0.07)
Basophils Absolute: 0 10*3/uL (ref 0.0–0.1)
Basophils Relative: 4 %
Eosinophils Absolute: 0 10*3/uL (ref 0.0–0.5)
Eosinophils Relative: 6 %
HCT: 38.5 % — ABNORMAL LOW (ref 39.0–52.0)
Hemoglobin: 12.6 g/dL — ABNORMAL LOW (ref 13.0–17.0)
Immature Granulocytes: 0 %
Lymphocytes Relative: 46 %
Lymphs Abs: 0.2 10*3/uL — ABNORMAL LOW (ref 0.7–4.0)
MCH: 28.4 pg (ref 26.0–34.0)
MCHC: 32.7 g/dL (ref 30.0–36.0)
MCV: 86.7 fL (ref 80.0–100.0)
Monocytes Absolute: 0.1 10*3/uL (ref 0.1–1.0)
Monocytes Relative: 21 %
Neutro Abs: 0.1 10*3/uL — CL (ref 1.7–7.7)
Neutrophils Relative %: 23 %
Platelets: 227 10*3/uL (ref 150–400)
RBC: 4.44 MIL/uL (ref 4.22–5.81)
RDW: 14.8 % (ref 11.5–15.5)
Smear Review: NORMAL
WBC: 0.5 10*3/uL — CL (ref 4.0–10.5)
nRBC: 0 % (ref 0.0–0.2)

## 2021-10-15 MED ORDER — HEPARIN SOD (PORK) LOCK FLUSH 100 UNIT/ML IV SOLN
500.0000 [IU] | Freq: Once | INTRAVENOUS | Status: AC
  Administered 2021-10-15: 500 [IU] via INTRAVENOUS
  Filled 2021-10-15: qty 5

## 2021-10-15 NOTE — Progress Notes (Signed)
Pt c/o weakness in legs, sore throat and stomach cramping. Pt recently dx with bronchitis ?

## 2021-10-15 NOTE — Progress Notes (Signed)
No Treatment today. Port needle removed. Pt discharged to home. ?

## 2021-10-16 ENCOUNTER — Ambulatory Visit
Admission: RE | Admit: 2021-10-16 | Discharge: 2021-10-16 | Disposition: A | Discharge status: Home / Self Care | Payer: Medicare Other | Source: Ambulatory Visit | Attending: Radiation Oncology | Admitting: Radiation Oncology

## 2021-10-16 DIAGNOSIS — Z510 Encounter for antineoplastic radiation therapy: Secondary | ICD-10-CM | POA: Diagnosis not present

## 2021-10-19 ENCOUNTER — Ambulatory Visit
Admission: RE | Admit: 2021-10-19 | Discharge: 2021-10-19 | Disposition: A | Discharge status: Home / Self Care | Payer: Medicare Other | Source: Ambulatory Visit | Attending: Radiation Oncology | Admitting: Radiation Oncology

## 2021-10-19 DIAGNOSIS — Z510 Encounter for antineoplastic radiation therapy: Secondary | ICD-10-CM | POA: Diagnosis not present

## 2021-10-20 ENCOUNTER — Ambulatory Visit
Admission: RE | Admit: 2021-10-20 | Discharge: 2021-10-20 | Disposition: A | Discharge status: Home / Self Care | Payer: Medicare Other | Source: Ambulatory Visit | Attending: Radiation Oncology | Admitting: Radiation Oncology

## 2021-10-20 DIAGNOSIS — Z510 Encounter for antineoplastic radiation therapy: Secondary | ICD-10-CM | POA: Diagnosis not present

## 2021-10-21 ENCOUNTER — Ambulatory Visit
Admission: RE | Admit: 2021-10-21 | Discharge: 2021-10-21 | Disposition: A | Discharge status: Home / Self Care | Payer: Medicare Other | Source: Ambulatory Visit | Attending: Radiation Oncology | Admitting: Radiation Oncology

## 2021-10-21 DIAGNOSIS — Z510 Encounter for antineoplastic radiation therapy: Secondary | ICD-10-CM | POA: Diagnosis not present

## 2021-10-22 ENCOUNTER — Inpatient Hospital Stay: Payer: Medicare Other

## 2021-10-22 ENCOUNTER — Encounter: Payer: Self-pay | Admitting: Internal Medicine

## 2021-10-22 ENCOUNTER — Ambulatory Visit
Admission: RE | Admit: 2021-10-22 | Discharge: 2021-10-22 | Disposition: A | Discharge status: Home / Self Care | Payer: Medicare Other | Source: Ambulatory Visit | Attending: Radiation Oncology | Admitting: Radiation Oncology

## 2021-10-22 ENCOUNTER — Inpatient Hospital Stay (HOSPITAL_BASED_OUTPATIENT_CLINIC_OR_DEPARTMENT_OTHER): Payer: Medicare Other | Admitting: Internal Medicine

## 2021-10-22 DIAGNOSIS — J449 Chronic obstructive pulmonary disease, unspecified: Secondary | ICD-10-CM

## 2021-10-22 DIAGNOSIS — F17210 Nicotine dependence, cigarettes, uncomplicated: Secondary | ICD-10-CM

## 2021-10-22 DIAGNOSIS — C801 Malignant (primary) neoplasm, unspecified: Secondary | ICD-10-CM | POA: Diagnosis not present

## 2021-10-22 DIAGNOSIS — C7989 Secondary malignant neoplasm of other specified sites: Secondary | ICD-10-CM | POA: Diagnosis not present

## 2021-10-22 DIAGNOSIS — N183 Chronic kidney disease, stage 3 unspecified: Secondary | ICD-10-CM

## 2021-10-22 DIAGNOSIS — I739 Peripheral vascular disease, unspecified: Secondary | ICD-10-CM | POA: Diagnosis not present

## 2021-10-22 DIAGNOSIS — Z510 Encounter for antineoplastic radiation therapy: Secondary | ICD-10-CM | POA: Diagnosis not present

## 2021-10-22 LAB — COMPREHENSIVE METABOLIC PANEL
ALT: 24 U/L (ref 0–44)
AST: 38 U/L (ref 15–41)
Albumin: 3.3 g/dL — ABNORMAL LOW (ref 3.5–5.0)
Alkaline Phosphatase: 85 U/L (ref 38–126)
Anion gap: 6 (ref 5–15)
BUN: 32 mg/dL — ABNORMAL HIGH (ref 8–23)
CO2: 29 mmol/L (ref 22–32)
Calcium: 8.5 mg/dL — ABNORMAL LOW (ref 8.9–10.3)
Chloride: 101 mmol/L (ref 98–111)
Creatinine, Ser: 1.19 mg/dL (ref 0.61–1.24)
GFR, Estimated: >60 mL/min (ref >60)
Glucose, Bld: 127 mg/dL — ABNORMAL HIGH (ref 70–99)
Potassium: 4.3 mmol/L (ref 3.5–5.1)
Sodium: 136 mmol/L (ref 135–145)
Total Bilirubin: 1.2 mg/dL (ref 0.3–1.2)
Total Protein: 6 g/dL — ABNORMAL LOW (ref 6.5–8.1)

## 2021-10-22 LAB — CBC WITH DIFFERENTIAL/PLATELET
Abs Immature Granulocytes: 0.01 10*3/uL (ref 0.00–0.07)
Basophils Absolute: 0 10*3/uL (ref 0.0–0.1)
Basophils Relative: 1 %
Eosinophils Absolute: 0 10*3/uL (ref 0.0–0.5)
Eosinophils Relative: 1 %
HCT: 38.5 % — ABNORMAL LOW (ref 39.0–52.0)
Hemoglobin: 12.4 g/dL — ABNORMAL LOW (ref 13.0–17.0)
Immature Granulocytes: 1 %
Lymphocytes Relative: 21 %
Lymphs Abs: 0.4 10*3/uL — ABNORMAL LOW (ref 0.7–4.0)
MCH: 28 pg (ref 26.0–34.0)
MCHC: 32.2 g/dL (ref 30.0–36.0)
MCV: 86.9 fL (ref 80.0–100.0)
Monocytes Absolute: 1 10*3/uL (ref 0.1–1.0)
Monocytes Relative: 47 %
Neutro Abs: 0.6 10*3/uL — ABNORMAL LOW (ref 1.7–7.7)
Neutrophils Relative %: 29 %
Platelets: 224 10*3/uL (ref 150–400)
RBC: 4.43 MIL/uL (ref 4.22–5.81)
RDW: 15.2 % (ref 11.5–15.5)
WBC: 2 10*3/uL — ABNORMAL LOW (ref 4.0–10.5)
nRBC: 0 % (ref 0.0–0.2)

## 2021-10-22 MED ORDER — HEPARIN SOD (PORK) LOCK FLUSH 100 UNIT/ML IV SOLN
500.0000 [IU] | Freq: Once | INTRAVENOUS | Status: AC
  Administered 2021-10-22: 500 [IU] via INTRAVENOUS
  Filled 2021-10-22: qty 5

## 2021-10-22 NOTE — Progress Notes (Signed)
Nutrition Follow-up: ? 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  S/p tonsillectomy on 3//15.  Patient receiving taxol-carbo and radiation.  Treatment on hold for today due to blood pressure. ? ?Met with patient in exam room. Reports that he is still eating.  Taste is decreasing but he is still trying to eat.  Reports dry mouth and thick saliva especially at night.  Drinking 2-3 boost plus per day.  Ate ham and mashed potatoes, great northern beans last night for dinner.  Had chicken for lunch and boost shake for breakfast.   ? ? ? ?Medications: reviewed ? ?Labs: reviewed ? ?Anthropometrics:  ? ?Weight 148 lb 12.8 oz on 4/13 ?146 lb 11.2 oz on 3/30 ?144 lb on 3/1 ? ?UBW of 150-155 lb few months ago ? ? ?NUTRITION DIAGNOSIS: Predicted suboptimal energy intake  ? ? ?INTERVENTION:  ?Discussed ways to help with dry mouth and thick saliva.  Handout provided ?Continue oral nutrition supplements and high calorie, high protein foods ?  ? ?MONITORING, EVALUATION, GOAL: weight trends, intake ? ? ?NEXT VISIT: Thursday, April 27 during infusion ? ?Neilah Fulwider B. Zenia Resides, RD, LDN ?Registered Dietitian ?[phone] ? ? ?

## 2021-10-22 NOTE — Progress Notes (Signed)
C/O loosing taste buds. Dry mouth. Getting hard to swallow. ?

## 2021-10-22 NOTE — Assessment & Plan Note (Addendum)
#  Squamous cell carcinoma of unknown primary- [CT scan December 2022 neck-left-sided 5 lymph nodes-largest approximately 1 to 2 cm in size]; s/p FNA-positive for squamous cell carcinoma. PET scan- FEB 17th, 5621-  Hypermetabolic left cervical lymph nodes consistent with nodalccmetastases. No primary mucosal malignancy identified in the head or neck. No evidence of distant metastatic disease.  Status post tonsillectomy March 15th. ? ?#Patient currently on concurrent chemoradiation- [carbo-taxol weekly; RT from 3/30 to 5/17].  Hold chemotherapy today given patient ongoing neutropenia; continue radiation.  We will again reevaluate in 1 week. ? ?#Borderline low BP - 90/67- recommend holding lasix.  Continue rest of his cardiac medications.   ? ?#Peripheral vascular disease-on Plavix/? CAD- /Entresto/farxiga [Dr.Khan; card].  Clinically compensated at this time.see above. ? ?#CKD-stage III-GFR 40s-monitor closely on chemo ? ?# COPD: trilegy; using- nebs prn.  Clinically stable. ? ?# PN- 1-monitor closely on systemic therapy. ? ?# Smoking:active smoker-recommend quitting smoking.  ? ?# DISPOSITION: ?# HOLD chemo today; de-access ?# 1 week- labs; cbc/cmp; carbo-taxol weekly ?# follow up in 2 weeks; MD labs- cbc/cmp;carbo-Taxol; weekly- Dr.B ? ?Cc; PCP/Dr.Khan,cards ? ? ?

## 2021-10-22 NOTE — Progress Notes (Signed)
I connected with [name] on 10/22/21 at  9:30 AM EDT by video enabled telemedicine visit and verified that I am speaking with the correct person using two identifiers.  ?I discussed the limitations, risks, security and privacy concerns of performing an evaluation and management service by telemedicine and the availability of in-person appointments. I also discussed with the patient that there may be a patient responsible charge related to this service. The patient expressed understanding and agreed to proceed.  ? ? ?Other persons participating in the visit and their role in the encounter: RN/medical reconciliation ?Patient?s location: home ?Provider?s location: office ? ?Oncology History Overview Note  ?# DEC 2023- CT scan neck-Four abnormal level 5 lymph nodes on the left asThe largest 2 may have enlarged slightly since the ultrasound of 1 ?month ago. The differential diagnosis is that of reactive nodal ?enlargement versus malignant nodal disease. Consider either ?additional close clinical follow-up or fine-needle aspiration of the ?largest and most inferior level 5 node. ? ?# s/p FNA- left neck LN- [dr.Bennett; FEB 2023]- DIAGNOSIS:  ?A. LYMPH NODE, LEFT CERVICAL; ULTRASOUND-GUIDED FINE NEEDLE ASPIRATION:  ?- DIAGNOSTIC OF MALIGNANCY.  ?- METASTATIC SQUAMOUS CELL CARCINOMA. ? ?DIAGNOSIS:  ?A. TONSIL, LEFT; TONSILLECTOMY:  ?- INVASIVE, NONKERATINIZING SQUAMOUS CELL CARCINOMA.  ? ?Comment:  ?An immunohistochemical study for p16 is pending, and will be reported as  ?an addendum.  ? ?B. TONGUE BASE, LEFT; BIOPSY:  ?- BENIGN SQUAMOUS MUCOSA WITH SUBMUCOSAL EPIDERMOID CYST AND REACTIVE  ?FOLLICULAR LYMPHOID HYPERPLASIA.  ?- NEGATIVE FOR DYSPLASIA AND MALIGNANCY.  ? ?C. ADENOID; ADENOIDECTOMY:  ?- BENIGN SQUAMOUS AND RESPIRATORY MUCOSA WITH UNDERLYING CHRONIC  ?INFLAMMATION AND FOCAL SALIVARY GLANDS, COMPATIBLE WITH ADENOIDS.  ?- NEGATIVE FOR ATYPIA AND MALIGNANCY.  ? ?P16 positive ? ?# Carbo-Taxol with RT  ? ? ?#  PVD- s/p stenting [on plavix; lasix- Dr.Shaukat Khan]; CKD Stage III; borderline DM  ?  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  ?10/01/2021 -  Chemotherapy  ? Patient is on Treatment Plan : carcinoma of unknow primary- Carboplatin / Paclitaxel + XRT q7d  ? ?  ?  ?Squamous cell carcinoma of head and neck ([place])  ?10/01/2021 Initial Diagnosis  ? Squamous cell carcinoma of head and neck ([place]) ? ?  ?10/01/2021 Cancer Staging  ? Staging form: Cervical Lymph Nodes and Unknown Primary Tumors of the Head and Neck, AJCC 8th Edition ?- Clinical stage from 10/01/2021: Stage IVA (cT0, cN2b, cM0) - Signed by Lloyd Huger, MD on 10/01/2021 ?Stage prefix: Initial diagnosis ? ?  ? ?Chief Complaint: Squamous cell carcinoma of left neck.  ? ? ?History of present illness:[name] 66 y.o.  male with history of smoker with left neck squamous cell carcinoma -of unknown primary/left Tonsillar HPV positive is here for follow-up. ? ?Patient is currently on concurrent chemoradiation with CarboTaxol.  Patient's chemotherapy had to be held last week because of neutropenia.  ? ?Denies any difficulty swallowing or drinking.  Denies any worsening pain.  ? ?Observation/objective: Alert & oriented x 3. In No acute distress.  ? 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 PET scan- FEB 17th, 9678-  Hypermetabolic  left cervical lymph nodes consistent with nodalccmetastases. No primary mucosal malignancy identified in the head or neck. No evidence of distant metastatic disease.  Status post tonsillectomy March 15th. ? ?#Patient currently  on concurrent chemoradiation- [carbo-taxol weekly; RT from 3/30 to 5/17].  Hold chemotherapy today given patient ongoing neutropenia; continue radiation.  We will again reevaluate in 1 week. ? ?#Borderline low BP - 90/67- recommend holding lasix.  Continue rest of his cardiac medications.   ? ?#Peripheral vascular disease-on Plavix/? CAD- /Entresto/farxiga [Dr.Khan; card].  Clinically compensated at this time.see above. ? ?#CKD-stage III-GFR 40s-monitor closely on chemo ? ?# COPD: trilegy; using- nebs prn.  Clinically stable. ? ?# PN- 1-monitor closely on systemic therapy. ? ?# Smoking:active smoker-recommend quitting smoking.  ? ?# DISPOSITION: ?# HOLD chemo today; de-access ?# 1 week- labs; cbc/cmp; carbo-taxol weekly ?# follow up in 2 weeks; MD labs- cbc/cmp;carbo-Taxol; weekly- Dr.B ? ?Cc; PCP/Dr.Khan,cards ? ? ?Follow-up instructions: ? ?I discussed the assessment and treatment plan with the patient.  The patient was provided an opportunity to ask questions and all were answered.  The patient agreed with the plan and demonstrated understanding of instructions. ? ?The patient was advised to call back or seek an in person evaluation if the symptoms worsen or if the condition fails to improve as anticipated. ? ?Dr. Charlaine Dalton ?[facility] at [facility] ?10/26/2021 ?12:45 PM ?

## 2021-10-23 ENCOUNTER — Ambulatory Visit
Admission: RE | Admit: 2021-10-23 | Discharge: 2021-10-23 | Disposition: A | Discharge status: Home / Self Care | Payer: Medicare Other | Source: Ambulatory Visit | Attending: Radiation Oncology | Admitting: Radiation Oncology

## 2021-10-23 DIAGNOSIS — Z510 Encounter for antineoplastic radiation therapy: Secondary | ICD-10-CM | POA: Diagnosis not present

## 2021-10-26 ENCOUNTER — Encounter: Payer: Self-pay | Admitting: Internal Medicine

## 2021-10-26 ENCOUNTER — Ambulatory Visit
Admission: RE | Admit: 2021-10-26 | Discharge: 2021-10-26 | Disposition: A | Discharge status: Home / Self Care | Payer: Medicare Other | Source: Ambulatory Visit | Attending: Radiation Oncology | Admitting: Radiation Oncology

## 2021-10-26 DIAGNOSIS — Z510 Encounter for antineoplastic radiation therapy: Secondary | ICD-10-CM | POA: Diagnosis not present

## 2021-10-27 ENCOUNTER — Other Ambulatory Visit: Payer: Self-pay

## 2021-10-27 ENCOUNTER — Ambulatory Visit
Admission: RE | Admit: 2021-10-27 | Discharge: 2021-10-27 | Disposition: A | Discharge status: Home / Self Care | Payer: Medicare Other | Source: Ambulatory Visit | Attending: Radiation Oncology | Admitting: Radiation Oncology

## 2021-10-27 DIAGNOSIS — Z510 Encounter for antineoplastic radiation therapy: Secondary | ICD-10-CM | POA: Diagnosis not present

## 2021-10-27 LAB — RAD ONC ARIA SESSION SUMMARY
Course Elapsed Days: 19
Plan Fractions Treated to Date: 10
Plan Prescribed Dose Per Fraction: 2 Gy
Plan Total Fractions Prescribed: 31
Plan Total Prescribed Dose: 62 Gy
Reference Point Dosage Given to Date: 28 Gy
Reference Point Session Dosage Given: 2 Gy
Session Number: 14

## 2021-10-28 ENCOUNTER — Other Ambulatory Visit: Payer: Self-pay

## 2021-10-28 ENCOUNTER — Ambulatory Visit
Admission: RE | Admit: 2021-10-28 | Discharge: 2021-10-28 | Disposition: A | Discharge status: Home / Self Care | Payer: Medicare Other | Source: Ambulatory Visit | Attending: Radiation Oncology | Admitting: Radiation Oncology

## 2021-10-28 DIAGNOSIS — Z510 Encounter for antineoplastic radiation therapy: Secondary | ICD-10-CM | POA: Diagnosis not present

## 2021-10-28 LAB — RAD ONC ARIA SESSION SUMMARY
Course Elapsed Days: 20
Plan Fractions Treated to Date: 11
Plan Prescribed Dose Per Fraction: 2 Gy
Plan Total Fractions Prescribed: 31
Plan Total Prescribed Dose: 62 Gy
Reference Point Dosage Given to Date: 30 Gy
Reference Point Session Dosage Given: 2 Gy
Session Number: 15

## 2021-10-29 ENCOUNTER — Inpatient Hospital Stay: Payer: Medicare Other

## 2021-10-29 ENCOUNTER — Other Ambulatory Visit: Payer: Self-pay | Admitting: Internal Medicine

## 2021-10-29 ENCOUNTER — Ambulatory Visit
Admission: RE | Admit: 2021-10-29 | Discharge: 2021-10-29 | Disposition: A | Discharge status: Home / Self Care | Payer: Medicare Other | Source: Ambulatory Visit | Attending: Radiation Oncology | Admitting: Radiation Oncology

## 2021-10-29 ENCOUNTER — Other Ambulatory Visit: Payer: Self-pay

## 2021-10-29 VITALS — BP 110/72 | HR 82 | Temp 96.3°F | Resp 16 | Wt 149.0 lb

## 2021-10-29 DIAGNOSIS — Z510 Encounter for antineoplastic radiation therapy: Secondary | ICD-10-CM | POA: Diagnosis not present

## 2021-10-29 DIAGNOSIS — C7989 Secondary malignant neoplasm of other specified sites: Secondary | ICD-10-CM

## 2021-10-29 LAB — RAD ONC ARIA SESSION SUMMARY
Course Elapsed Days: 21
Plan Fractions Treated to Date: 12
Plan Prescribed Dose Per Fraction: 2 Gy
Plan Total Fractions Prescribed: 31
Plan Total Prescribed Dose: 62 Gy
Reference Point Dosage Given to Date: 32 Gy
Reference Point Session Dosage Given: 2 Gy
Session Number: 16

## 2021-10-29 LAB — CBC WITH DIFFERENTIAL/PLATELET
Abs Immature Granulocytes: 0.1 10*3/uL — ABNORMAL HIGH (ref 0.00–0.07)
Basophils Absolute: 0 10*3/uL (ref 0.0–0.1)
Basophils Relative: 0 %
Eosinophils Absolute: 0 10*3/uL (ref 0.0–0.5)
Eosinophils Relative: 0 %
HCT: 38.1 % — ABNORMAL LOW (ref 39.0–52.0)
Hemoglobin: 12.3 g/dL — ABNORMAL LOW (ref 13.0–17.0)
Immature Granulocytes: 1 %
Lymphocytes Relative: 2 %
Lymphs Abs: 0.2 10*3/uL — ABNORMAL LOW (ref 0.7–4.0)
MCH: 28.3 pg (ref 26.0–34.0)
MCHC: 32.3 g/dL (ref 30.0–36.0)
MCV: 87.6 fL (ref 80.0–100.0)
Monocytes Absolute: 1 10*3/uL (ref 0.1–1.0)
Monocytes Relative: 9 %
Neutro Abs: 10.2 10*3/uL — ABNORMAL HIGH (ref 1.7–7.7)
Neutrophils Relative %: 88 %
Platelets: 163 10*3/uL (ref 150–400)
RBC: 4.35 MIL/uL (ref 4.22–5.81)
RDW: 16.2 % — ABNORMAL HIGH (ref 11.5–15.5)
WBC: 11.5 10*3/uL — ABNORMAL HIGH (ref 4.0–10.5)
nRBC: 0 % (ref 0.0–0.2)

## 2021-10-29 LAB — COMPREHENSIVE METABOLIC PANEL
ALT: 15 U/L (ref 0–44)
AST: 22 U/L (ref 15–41)
Albumin: 3.3 g/dL — ABNORMAL LOW (ref 3.5–5.0)
Alkaline Phosphatase: 83 U/L (ref 38–126)
Anion gap: 6 (ref 5–15)
BUN: 42 mg/dL — ABNORMAL HIGH (ref 8–23)
CO2: 29 mmol/L (ref 22–32)
Calcium: 8.2 mg/dL — ABNORMAL LOW (ref 8.9–10.3)
Chloride: 98 mmol/L (ref 98–111)
Creatinine, Ser: 1.29 mg/dL — ABNORMAL HIGH (ref 0.61–1.24)
GFR, Estimated: >60 mL/min (ref >60)
Glucose, Bld: 147 mg/dL — ABNORMAL HIGH (ref 70–99)
Potassium: 4.4 mmol/L (ref 3.5–5.1)
Sodium: 133 mmol/L — ABNORMAL LOW (ref 135–145)
Total Bilirubin: 1.3 mg/dL — ABNORMAL HIGH (ref 0.3–1.2)
Total Protein: 6.3 g/dL — ABNORMAL LOW (ref 6.5–8.1)

## 2021-10-29 MED ORDER — SODIUM CHLORIDE 0.9 % IV SOLN
10.0000 mg | Freq: Once | INTRAVENOUS | Status: AC
  Administered 2021-10-29: 10 mg via INTRAVENOUS
  Filled 2021-10-29: qty 1
  Filled 2021-10-29: qty 10
  Filled 2021-10-29: qty 1

## 2021-10-29 MED ORDER — DIPHENHYDRAMINE HCL 50 MG/ML IJ SOLN
50.0000 mg | Freq: Once | INTRAMUSCULAR | Status: AC
  Administered 2021-10-29: 50 mg via INTRAVENOUS
  Filled 2021-10-29: qty 1

## 2021-10-29 MED ORDER — HEPARIN SOD (PORK) LOCK FLUSH 100 UNIT/ML IV SOLN
INTRAVENOUS | Status: AC
  Administered 2021-10-29: 500 [IU]
  Filled 2021-10-29: qty 5

## 2021-10-29 MED ORDER — HEPARIN SOD (PORK) LOCK FLUSH 100 UNIT/ML IV SOLN
500.0000 [IU] | Freq: Once | INTRAVENOUS | Status: AC | PRN
  Filled 2021-10-29: qty 5

## 2021-10-29 MED ORDER — PALONOSETRON HCL INJECTION 0.25 MG/5ML
0.2500 mg | Freq: Once | INTRAVENOUS | Status: AC
  Administered 2021-10-29: 0.25 mg via INTRAVENOUS
  Filled 2021-10-29: qty 5

## 2021-10-29 MED ORDER — SODIUM CHLORIDE 0.9 % IV SOLN
45.0000 mg/m2 | Freq: Once | INTRAVENOUS | Status: AC
  Administered 2021-10-29: 78 mg via INTRAVENOUS
  Filled 2021-10-29: qty 13

## 2021-10-29 MED ORDER — FAMOTIDINE IN NACL 20-0.9 MG/50ML-% IV SOLN
20.0000 mg | Freq: Once | INTRAVENOUS | Status: AC
  Administered 2021-10-29: 20 mg via INTRAVENOUS
  Filled 2021-10-29: qty 50

## 2021-10-29 MED ORDER — SODIUM CHLORIDE 0.9 % IV SOLN
Freq: Once | INTRAVENOUS | Status: AC
  Filled 2021-10-29: qty 250

## 2021-10-29 MED ORDER — SODIUM CHLORIDE 0.9 % IV SOLN
158.6000 mg | Freq: Once | INTRAVENOUS | Status: AC
  Administered 2021-10-29: 160 mg via INTRAVENOUS
  Filled 2021-10-29: qty 16

## 2021-10-29 NOTE — Patient Instructions (Signed)
[facility] CANCER CTR AT [place]  Discharge Instructions: ?Thank you for choosing [place] to provide your oncology and hematology care.  ?If you have a lab appointment with the [facility], please go directly to the [facility] and check in at the registration area. ? ?Wear comfortable clothing and clothing appropriate for easy access to any Portacath or PICC line.  ? ?We strive to give you quality time with your provider. You may need to reschedule your appointment if you arrive late (15 or more minutes).  Arriving late affects you and other patients whose appointments are after yours.  Also, if you miss three or more appointments without notifying the office, you may be dismissed from the clinic at the provider?s discretion.    ?  ?For prescription refill requests, have your pharmacy contact our office and allow 72 hours for refills to be completed.   ? ?Today you received the following chemotherapy and/or immunotherapy agents Carboplatin, Taxol    ?  ?To help prevent nausea and vomiting after your treatment, we encourage you to take your nausea medication as directed. ? ?BELOW ARE SYMPTOMS THAT SHOULD BE REPORTED IMMEDIATELY: ?*FEVER GREATER THAN 100.4 F (38 ?C) OR HIGHER ?*CHILLS OR SWEATING ?*NAUSEA AND VOMITING THAT IS NOT CONTROLLED WITH YOUR NAUSEA MEDICATION ?*UNUSUAL SHORTNESS OF BREATH ?*UNUSUAL BRUISING OR BLEEDING ?*URINARY PROBLEMS (pain or burning when urinating, or frequent urination) ?*BOWEL PROBLEMS (unusual diarrhea, constipation, pain near the anus) ?TENDERNESS IN MOUTH AND THROAT WITH OR WITHOUT PRESENCE OF ULCERS (sore throat, sores in mouth, or a toothache) ?UNUSUAL RASH, SWELLING OR PAIN  ?UNUSUAL VAGINAL DISCHARGE OR ITCHING  ? ?Items with * indicate a potential emergency and should be followed up as soon as possible or go to the Emergency Department if any problems should occur. ? ?Please show the CHEMOTHERAPY ALERT CARD or IMMUNOTHERAPY ALERT CARD at  check-in to the Emergency Department and triage nurse. ? ?Should you have questions after your visit or need to cancel or reschedule your appointment, please contact [facility] CANCER [place] AT [place]  [phone] and follow the prompts.  Office hours are 8:00 a.m. to 4:30 p.m. Monday - Friday. Please note that voicemails left after 4:00 p.m. may not be returned until the following business day.  We are closed weekends and major holidays. You have access to a nurse at all times for urgent questions. Please call the main number to the clinic [phone] and follow the prompts. ? ?For any non-urgent questions, you may also contact your provider using MyChart. We now offer e-Visits for anyone 27 and older to request care online for non-urgent symptoms. For details visit [email or web address]. ?  ?Also download the MyChart app! Go to the app store, search "MyChart", open the app, select [place], and log in with your MyChart username and password. ? ?Due to Covid, a mask is required upon entering the hospital/clinic. If you do not have a mask, one will be given to you upon arrival. For doctor visits, patients may have 1 support person aged 37 or older with them. For treatment visits, patients cannot have anyone with them due to current Covid guidelines and our immunocompromised population.  ?

## 2021-10-30 ENCOUNTER — Other Ambulatory Visit: Payer: Self-pay

## 2021-10-30 ENCOUNTER — Ambulatory Visit
Admission: RE | Admit: 2021-10-30 | Discharge: 2021-10-30 | Disposition: A | Discharge status: Home / Self Care | Payer: Medicare Other | Source: Ambulatory Visit | Attending: Radiation Oncology | Admitting: Radiation Oncology

## 2021-10-30 DIAGNOSIS — Z510 Encounter for antineoplastic radiation therapy: Secondary | ICD-10-CM | POA: Diagnosis not present

## 2021-10-30 LAB — RAD ONC ARIA SESSION SUMMARY
Course Elapsed Days: 22
Plan Fractions Treated to Date: 13
Plan Prescribed Dose Per Fraction: 2 Gy
Plan Total Fractions Prescribed: 31
Plan Total Prescribed Dose: 62 Gy
Reference Point Dosage Given to Date: 34 Gy
Reference Point Session Dosage Given: 2 Gy
Session Number: 17

## 2021-11-02 ENCOUNTER — Other Ambulatory Visit: Payer: Self-pay

## 2021-11-02 ENCOUNTER — Ambulatory Visit
Admission: RE | Admit: 2021-11-02 | Discharge: 2021-11-02 | Disposition: A | Discharge status: Home / Self Care | Payer: Medicare Other | Source: Ambulatory Visit | Attending: Radiation Oncology | Admitting: Radiation Oncology

## 2021-11-02 DIAGNOSIS — Z510 Encounter for antineoplastic radiation therapy: Secondary | ICD-10-CM | POA: Diagnosis not present

## 2021-11-02 LAB — RAD ONC ARIA SESSION SUMMARY
Course Elapsed Days: 25
Plan Fractions Treated to Date: 14
Plan Prescribed Dose Per Fraction: 2 Gy
Plan Total Fractions Prescribed: 31
Plan Total Prescribed Dose: 62 Gy
Reference Point Dosage Given to Date: 36 Gy
Reference Point Session Dosage Given: 2 Gy
Session Number: 18

## 2021-11-03 ENCOUNTER — Ambulatory Visit
Admission: RE | Admit: 2021-11-03 | Discharge: 2021-11-03 | Disposition: A | Discharge status: Home / Self Care | Payer: Medicare Other | Source: Ambulatory Visit | Attending: Radiation Oncology | Admitting: Radiation Oncology

## 2021-11-03 ENCOUNTER — Other Ambulatory Visit: Payer: Self-pay

## 2021-11-03 ENCOUNTER — Other Ambulatory Visit: Payer: Self-pay | Admitting: *Deleted

## 2021-11-03 DIAGNOSIS — Z510 Encounter for antineoplastic radiation therapy: Secondary | ICD-10-CM | POA: Diagnosis not present

## 2021-11-03 LAB — RAD ONC ARIA SESSION SUMMARY
Course Elapsed Days: 26
Plan Fractions Treated to Date: 15
Plan Prescribed Dose Per Fraction: 2 Gy
Plan Total Fractions Prescribed: 31
Plan Total Prescribed Dose: 62 Gy
Reference Point Dosage Given to Date: 38 Gy
Reference Point Session Dosage Given: 2 Gy
Session Number: 19

## 2021-11-03 MED ORDER — SUCRALFATE 1 G PO TABS: 1.0000 g | ORAL_TABLET | Freq: Three times a day (TID) | ORAL | 1 refills | Status: AC

## 2021-11-03 MED ORDER — DEXAMETHASONE 2 MG PO TABS
2.0000 mg | ORAL_TABLET | Freq: Two times a day (BID) | ORAL | 0 refills | Status: AC
Start: 2021-11-03 — End: 2021-12-03

## 2021-11-04 ENCOUNTER — Other Ambulatory Visit: Payer: Self-pay

## 2021-11-04 ENCOUNTER — Ambulatory Visit
Admission: RE | Admit: 2021-11-04 | Discharge: 2021-11-04 | Disposition: A | Discharge status: Home / Self Care | Payer: Medicare Other | Source: Ambulatory Visit | Attending: Radiation Oncology | Admitting: Radiation Oncology

## 2021-11-04 DIAGNOSIS — Z510 Encounter for antineoplastic radiation therapy: Secondary | ICD-10-CM | POA: Diagnosis not present

## 2021-11-04 LAB — RAD ONC ARIA SESSION SUMMARY
Course Elapsed Days: 27
Plan Fractions Treated to Date: 16
Plan Prescribed Dose Per Fraction: 2 Gy
Plan Total Fractions Prescribed: 31
Plan Total Prescribed Dose: 62 Gy
Reference Point Dosage Given to Date: 40 Gy
Reference Point Session Dosage Given: 2 Gy
Session Number: 20

## 2021-11-05 ENCOUNTER — Inpatient Hospital Stay: Payer: Medicare Other

## 2021-11-05 ENCOUNTER — Encounter: Payer: Self-pay | Admitting: Internal Medicine

## 2021-11-05 ENCOUNTER — Ambulatory Visit
Admission: RE | Admit: 2021-11-05 | Discharge: 2021-11-05 | Disposition: A | Discharge status: Home / Self Care | Payer: Medicare Other | Source: Ambulatory Visit | Attending: Radiation Oncology | Admitting: Radiation Oncology

## 2021-11-05 ENCOUNTER — Other Ambulatory Visit: Payer: Self-pay

## 2021-11-05 ENCOUNTER — Inpatient Hospital Stay (HOSPITAL_BASED_OUTPATIENT_CLINIC_OR_DEPARTMENT_OTHER): Payer: Medicare Other | Admitting: Internal Medicine

## 2021-11-05 DIAGNOSIS — C7989 Secondary malignant neoplasm of other specified sites: Secondary | ICD-10-CM | POA: Diagnosis not present

## 2021-11-05 DIAGNOSIS — C801 Malignant (primary) neoplasm, unspecified: Secondary | ICD-10-CM

## 2021-11-05 DIAGNOSIS — Z510 Encounter for antineoplastic radiation therapy: Secondary | ICD-10-CM | POA: Diagnosis not present

## 2021-11-05 LAB — RAD ONC ARIA SESSION SUMMARY
Course Elapsed Days: 28
Plan Fractions Treated to Date: 17
Plan Prescribed Dose Per Fraction: 2 Gy
Plan Total Fractions Prescribed: 31
Plan Total Prescribed Dose: 62 Gy
Reference Point Dosage Given to Date: 42 Gy
Reference Point Session Dosage Given: 2 Gy
Session Number: 21

## 2021-11-05 LAB — CBC WITH DIFFERENTIAL/PLATELET
Abs Immature Granulocytes: 0.21 10*3/uL — ABNORMAL HIGH (ref 0.00–0.07)
Basophils Absolute: 0 10*3/uL (ref 0.0–0.1)
Basophils Relative: 1 %
Eosinophils Absolute: 0 10*3/uL (ref 0.0–0.5)
Eosinophils Relative: 0 %
HCT: 38 % — ABNORMAL LOW (ref 39.0–52.0)
Hemoglobin: 12.3 g/dL — ABNORMAL LOW (ref 13.0–17.0)
Immature Granulocytes: 4 %
Lymphocytes Relative: 2 %
Lymphs Abs: 0.1 10*3/uL — ABNORMAL LOW (ref 0.7–4.0)
MCH: 28 pg (ref 26.0–34.0)
MCHC: 32.4 g/dL (ref 30.0–36.0)
MCV: 86.4 fL (ref 80.0–100.0)
Monocytes Absolute: 0.1 10*3/uL (ref 0.1–1.0)
Monocytes Relative: 2 %
Neutro Abs: 4.8 10*3/uL (ref 1.7–7.7)
Neutrophils Relative %: 91 %
Platelets: 111 10*3/uL — ABNORMAL LOW (ref 150–400)
RBC: 4.4 MIL/uL (ref 4.22–5.81)
RDW: 15.8 % — ABNORMAL HIGH (ref 11.5–15.5)
WBC: 5.3 10*3/uL (ref 4.0–10.5)
nRBC: 0 % (ref 0.0–0.2)

## 2021-11-05 LAB — COMPREHENSIVE METABOLIC PANEL
ALT: 18 U/L (ref 0–44)
AST: 33 U/L (ref 15–41)
Albumin: 3.1 g/dL — ABNORMAL LOW (ref 3.5–5.0)
Alkaline Phosphatase: 66 U/L (ref 38–126)
Anion gap: 8 (ref 5–15)
BUN: 32 mg/dL — ABNORMAL HIGH (ref 8–23)
CO2: 27 mmol/L (ref 22–32)
Calcium: 8.2 mg/dL — ABNORMAL LOW (ref 8.9–10.3)
Chloride: 100 mmol/L (ref 98–111)
Creatinine, Ser: 0.98 mg/dL (ref 0.61–1.24)
GFR, Estimated: >60 mL/min (ref >60)
Glucose, Bld: 129 mg/dL — ABNORMAL HIGH (ref 70–99)
Potassium: 4.3 mmol/L (ref 3.5–5.1)
Sodium: 135 mmol/L (ref 135–145)
Total Bilirubin: 1.4 mg/dL — ABNORMAL HIGH (ref 0.3–1.2)
Total Protein: 5.7 g/dL — ABNORMAL LOW (ref 6.5–8.1)

## 2021-11-05 MED ORDER — SODIUM CHLORIDE 0.9 % IV SOLN
193.0000 mg | Freq: Once | INTRAVENOUS | Status: AC
  Administered 2021-11-05: 190 mg via INTRAVENOUS
  Filled 2021-11-05: qty 19

## 2021-11-05 MED ORDER — MAGIC MOUTHWASH W/LIDOCAINE: 5.0000 mL | Freq: Four times a day (QID) | ORAL | 1 refills | Status: AC | PRN

## 2021-11-05 MED ORDER — DIPHENHYDRAMINE HCL 50 MG/ML IJ SOLN
50.0000 mg | Freq: Once | INTRAMUSCULAR | Status: AC
  Administered 2021-11-05: 50 mg via INTRAVENOUS
  Filled 2021-11-05: qty 1

## 2021-11-05 MED ORDER — HEPARIN SOD (PORK) LOCK FLUSH 100 UNIT/ML IV SOLN
INTRAVENOUS | Status: AC
  Administered 2021-11-05: 500 [IU]
  Filled 2021-11-05: qty 5

## 2021-11-05 MED ORDER — FAMOTIDINE IN NACL 20-0.9 MG/50ML-% IV SOLN
20.0000 mg | Freq: Once | INTRAVENOUS | Status: AC
  Administered 2021-11-05: 20 mg via INTRAVENOUS
  Filled 2021-11-05: qty 50

## 2021-11-05 MED ORDER — HEPARIN SOD (PORK) LOCK FLUSH 100 UNIT/ML IV SOLN
500.0000 [IU] | Freq: Once | INTRAVENOUS | Status: AC | PRN
  Filled 2021-11-05: qty 5

## 2021-11-05 MED ORDER — SODIUM CHLORIDE 0.9 % IV SOLN
Freq: Once | INTRAVENOUS | Status: AC
  Filled 2021-11-05: qty 250

## 2021-11-05 MED ORDER — SODIUM CHLORIDE 0.9 % IV SOLN
10.0000 mg | Freq: Once | INTRAVENOUS | Status: AC
  Administered 2021-11-05: 10 mg via INTRAVENOUS
  Filled 2021-11-05: qty 1

## 2021-11-05 MED ORDER — PALONOSETRON HCL INJECTION 0.25 MG/5ML
0.2500 mg | Freq: Once | INTRAVENOUS | Status: AC
  Administered 2021-11-05: 0.25 mg via INTRAVENOUS
  Filled 2021-11-05: qty 5

## 2021-11-05 MED ORDER — SODIUM CHLORIDE 0.9 % IV SOLN
45.0000 mg/m2 | Freq: Once | INTRAVENOUS | Status: AC
  Administered 2021-11-05: 78 mg via INTRAVENOUS
  Filled 2021-11-05: qty 13

## 2021-11-05 NOTE — Progress Notes (Signed)
Nutrition Follow-up: ? ?Patient with left neck squamous cell carcinoma of unknown primary, p 16+.  S.p tonsillectomy on 3/15.  Patient receiving taxol-carbo and radiation.   ? ?Met with patient during infusion. Patient reports that he has no taste, sore throat, some diarrhea and little bit of nausea/vomiting.  Wife is grinded foods up for him.  Yesterday ate macaroni and cheese, creamed potatoes, ground turkey,milkshake.  Trying to drink 2 boost shakes a day.  Feels like they may be upsetting stomach, however before starting treatment did not upset stomach.   ? ? ? ?Medications: decadron, carafate, imodium, zofran and compazine, Magic Mouthwash ? ?Labs: glucose 129, BUN 32 ? ?Anthropometrics:  ? ?Weight 149 lb today ? ?148 lb 12.8 oz on 4/13 ?146 lb 11/2 oz on 3/30 ?144 lb on 3/1 ? ?UBW of 150-155 lb ? ? ?NUTRITION DIAGNOSIS: Predicted suboptimal energy intake continues ? ? ?INTERVENTION:  ?Offered another oral nutrition supplement shake to try to see if upset stomach but patient declined at this time. ?Wife to continue to grind foods, add gravies, sauces, butter for additional calories. ?Encouraged use of magic mouthwash once received ?  ? ?MONITORING, EVALUATION, GOAL: weight trends, intake ? ? ?NEXT VISIT: Thursday, May 4 during infusion ? ?Collier Bohnet B. Zenia Resides, RD, LDN ?Registered Dietitian ?[phone] ? ? ?

## 2021-11-05 NOTE — Progress Notes (Signed)
[place] [place] ?CONSULT NOTE ? ?Patient Care Team: ?Perrin Maltese, MD as PCP - General (Internal Medicine) ?Cammie Sickle, MD as Consulting Physician (Hematology and Oncology) ? ?CHIEF COMPLAINTS/PURPOSE OF CONSULTATION: head and neck cancer ? ?#  ?Oncology History Overview Note  ?# DEC 2023- CT scan neck-Four abnormal level 5 lymph nodes on the left asThe largest 2 may have enlarged slightly since the ultrasound of 1 ?month ago. The differential diagnosis is that of reactive nodal ?enlargement versus malignant nodal disease. Consider either ?additional close clinical follow-up or fine-needle aspiration of the ?largest and most inferior level 5 node. ? ?# s/p FNA- left neck LN- [dr.Bennett; FEB 2023]- DIAGNOSIS:  ?A. LYMPH NODE, LEFT CERVICAL; ULTRASOUND-GUIDED FINE NEEDLE ASPIRATION:  ?- DIAGNOSTIC OF MALIGNANCY.  ?- METASTATIC SQUAMOUS CELL CARCINOMA. ? ?DIAGNOSIS:  ?A. TONSIL, LEFT; TONSILLECTOMY:  ?- INVASIVE, NONKERATINIZING SQUAMOUS CELL CARCINOMA.  ? ?Comment:  ?An immunohistochemical study for p16 is pending, and will be reported as  ?an addendum.  ? ?B. TONGUE BASE, LEFT; BIOPSY:  ?- BENIGN SQUAMOUS MUCOSA WITH SUBMUCOSAL EPIDERMOID CYST AND REACTIVE  ?FOLLICULAR LYMPHOID HYPERPLASIA.  ?- NEGATIVE FOR DYSPLASIA AND MALIGNANCY.  ? ?C. ADENOID; ADENOIDECTOMY:  ?- BENIGN SQUAMOUS AND RESPIRATORY MUCOSA WITH UNDERLYING CHRONIC  ?INFLAMMATION AND FOCAL SALIVARY GLANDS, COMPATIBLE WITH ADENOIDS.  ?- NEGATIVE FOR ATYPIA AND MALIGNANCY.  ? ?P16 positive ? ?# Carbo-Taxol with RT  ? ? ?# PVD- s/p stenting [on plavix; lasix- Dr.Shaukat Khan]; CKD Stage III; borderline DM  ?  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  ?10/01/2021 -  Chemotherapy  ? Patient is on Treatment Plan : carcinoma of unknow primary- Carboplatin / Paclitaxel + XRT q7d   ? ?  ?  ?Squamous cell carcinoma of head and neck ([place])  ?10/01/2021 Initial Diagnosis  ? Squamous cell carcinoma of head and neck ([place]) ? ?  ?10/01/2021 Cancer Staging  ? Staging form: Cervical Lymph Nodes and Unknown Primary Tumors of the Head and Neck, AJCC 8th Edition ?- Clinical stage from 10/01/2021: Stage IVA (cT0, cN2b, cM0) - Signed by Lloyd Huger, MD on 10/01/2021 ?Stage prefix: Initial diagnosis ? ?  ? ? ? ?HISTORY OF PRESENTING ILLNESS: Ambulating independently.  Alone.  ? ?[name] 66 y.o.  male smoker male with history of left neck squamous cell carcinoma -of unknown primary/left Tonsillar HPV positive is here for follow-up. ?  ?Patient is currently on concurrent chemoradiation with CarboTaxol.  Patient's chemotherapy had to be held 2 weeks agp because of neutropenia.  ?  ?Complains of difficulty swallowing.  He also complains of worsening pain with swallowing.  Complains of mild diarrhea.  Has been taking Imodium. ? ?Notes that the lump in the neck improved.  ? ?Review of Systems  ?Constitutional:  Positive for malaise/fatigue and weight loss. Negative for chills, diaphoresis and fever.  ?HENT:  Negative for nosebleeds and sore throat.   ?Eyes:  Negative for double vision.  ?Respiratory:  Negative for cough, hemoptysis, sputum production, shortness of breath and wheezing.   ?Cardiovascular:  Negative for chest pain, palpitations, orthopnea and leg swelling.  ?Gastrointestinal:  Negative for abdominal pain, blood in stool, constipation, diarrhea, heartburn, melena, nausea and vomiting.  ?Genitourinary:  Negative for dysuria, frequency and urgency.  ?Musculoskeletal:  Positive for back pain and joint pain.  ?Skin: Negative.  Negative for  itching and rash.  ?Neurological:  Negative for dizziness, tingling, focal weakness, weakness and headaches.  ?Endo/Heme/Allergies:  Does not bruise/bleed easily.  ?Psychiatric/Behavioral:  Negative for depression. The patient is not nervous/anxious and does  not have insomnia.    ? ?MEDICAL HISTORY:  ?Past Medical History:  ?Diagnosis Date  ? Anxiety   ? a.) on BZO therapy  ? Aortic atherosclerosis ([place])   ? Arthritis   ? knees, lower back  ? Asthma   ? Cardiomyopathy ([place])   ? a.) TTE 03/24/2004: EF 12%; sev LV dilation; severe global HK. b.) TTE 05/05/2011: EF 26%; LA/LV mod dilated; mild MR. c.) TTE 12/15/2020: EF <20%; mod-sev LV dilation; mod LA dilation; mild MR; G3DD.  ? Carotid artery stenosis   ? CHF (congestive heart failure) ([place])   ? Chronic back pain   ? Chronic pain syndrome   ? CKD (chronic kidney disease), stage III ([place])   ? Complication of anesthesia   ? a.) difficulty maintaining anesthesia due to chronic opioid therapy; taking scheduled opioids QID  ? COPD (chronic obstructive pulmonary disease) ([place])   ? Coronary artery disease   ? a.) LHC 04/04/2002 -> norm coronary anatomy; EF 20%.  b.)  Coronary CTA 07/28/2012: Ca score 143.4.  LAD, LCx, and RCA with no significant disease. c.) Coronary CTA 04/22/2015: Ca score 194.2; mild pLAD and LCx disease.  ? Diverticulitis   ? a.) s/p bowel resection  ? GERD (gastroesophageal reflux disease)   ? Gout   ? Hepatitis C virus infection without hepatic coma 02/2016  ? a.) genotype 1a;  Tx'd with Ledipasvir / Sofosbuvir course  ? History of ETOH abuse   ? History of kidney stones   ? HLD (hyperlipidemia)   ? Hypertension   ? LBBB (left bundle branch block)   ? Long term current use of antithrombotics/antiplatelets   ? a.) clopidogrel  ? Long term prescription benzodiazepine use   ? Long term prescription opiate use   ? Metastatic squamous cell carcinoma involving lymph node with unknown primary site [facility]) 08/14/2021  ? a.) FNA LEFT cervical lymph node 08/14/2021 -- (+) for metastatic squamous cell carcinoma; primary unknown. b.) PET CT 08/28/2021: LEFT cervical LAD --> 0.9 cm level 2 node (SUV 4.0), 1.2 cm level 3 node (SUV 5.2), smaller FDG avid cervical nodes noted, but too small to characterize.  ? Microcytic  anemia   ? Prediabetes   ? PVD (peripheral vascular disease) with claudication ([place])   ? a.) 10/2019 --> s/p mechanical thrombectomy, PTCA, stent placement to RIGHT SFA and popliteal arteries  ? SBO (small bowel obstruction) ([place]) 2007  ? a.) x 2  ? Symptomatic bradycardia   ? ? ?SURGICAL HISTORY: ?Past Surgical History:  ?Procedure Laterality Date  ? BACK SURGERY    ? CHOLECYSTECTOMY    ? COLON RESECTION  2007  ? due to diverticulitis  ? COLONOSCOPY WITH PROPOFOL N/A 03/29/2017  ? Procedure: COLONOSCOPY WITH PROPOFOL;  Surgeon: Lucilla Lame, MD;  Location: [facility] ENDOSCOPY;  Service: Endoscopy;  Laterality: N/A;  ? CYSTOSCOPY/URETEROSCOPY/HOLMIUM LASER/STENT PLACEMENT Left 07/16/2019  ? Procedure: CYSTOSCOPY/URETEROSCOPY/HOLMIUM LASER/STENT Exchange;  Surgeon: Hollice Espy, MD;  Location: [facility] ORS;  Service: Urology;  Laterality: Left;  ? ESOPHAGOGASTRODUODENOSCOPY (EGD) WITH PROPOFOL N/A 03/29/2017  ? Procedure: ESOPHAGOGASTRODUODENOSCOPY (EGD) WITH PROPOFOL;  Surgeon: Lucilla Lame, MD;  Location: [facility] ENDOSCOPY;  Service: Endoscopy;  Laterality: N/A;  ? FINGER SURGERY  2007  ? cut finger off with saw and was reattached  ? HERNIA REPAIR    ?  HOLMIUM LASER APPLICATION N/A 84/21/0312  ? Procedure: HOLMIUM LASER APPLICATION;  Surgeon: Hollice Espy, MD;  Location: [facility] ORS;  Service: Urology;  Laterality: N/A;  ? IR IMAGING GUIDED PORT INSERTION  09/09/2021  ? IR NEPHROSTOMY PLACEMENT LEFT  05/28/2019  ? KIDNEY SURGERY  2021  ? KNEE ARTHROSCOPY Bilateral   ? LARYNGOSCOPY Bilateral 09/23/2021  ? Procedure: DIRECT LARYNGOSCOPY WITH BIOPSIES;  Surgeon: Clyde Canterbury, MD;  Location: [facility] ORS;  Service: ENT;  Laterality: Bilateral;  ? LEFT HEART CATH AND CORONARY ANGIOGRAPHY Left 04/04/2002  ? Procedure: LEFT HEART CATH AND CORONARY ANGIOGRAPHY; Location: [place]; Surgeon: Bartholome Bill, MD  ? LOWER EXTREMITY ANGIOGRAPHY Right 11/09/2017  ? Procedure: LOWER EXTREMITY ANGIOGRAPHY;  Surgeon: Katha Cabal, MD;  Location:  [place] CV LAB;  Service: Cardiovascular;  Laterality: Right;  ? LOWER EXTREMITY ANGIOGRAPHY Right 11/21/2018  ? Procedure: LOWER EXTREMITY ANGIOGRAPHY;  Surgeon: Katha Cabal, MD;  Desma Maxim

## 2021-11-05 NOTE — Addendum Note (Signed)
Addended by: Vanice Sarah on: 11/05/2021 11:29 AM ? ? Modules accepted: Orders ? ?

## 2021-11-05 NOTE — Progress Notes (Signed)
States not loosing weight, but not eating either, has no taste. ?

## 2021-11-05 NOTE — Assessment & Plan Note (Addendum)
#  Squamous cell carcinoma of unknown primary- [CT scan December 2022 neck-left-sided 5 lymph nodes-largest approximately 1 to 2 cm in size]; s/p FNA-positive for squamous cell carcinoma. PET scan- FEB 17th, 1093-  Hypermetabolic left cervical lymph nodes consistent with nodalccmetastases. No primary mucosal malignancy identified in the head or neck. No evidence of distant metastatic disease.  Status post tonsillectomy March 15th- HPV POSITIVE. Currently on concurrent chemoradiation- [carbo-taxol weekly; RT from 3/30 to 5/17].   ? ?# proceed with carbo-taxol- weekly; Labs today reviewed;  acceptable for treatment today.  ? ?# Nausea- G-1-2; continue ani-emetics prn. ? ?# Diarrhea-Gi-1continue Immoidum prn ? ?# HTN-- 120s- recommend holding lasix.  Continue rest of his cardiac medications.   ? ?#Peripheral vascular disease-on Plavix/? CAD- /Entresto/farxiga [Dr.Khan; card].  Clinically compensated at this time ? ?#CKD-stage II-GFR 40s-60s--monitor closely on chemo-  STABLE>  ? ?# COPD: trilegy; using- nebs prn.  Clinically stable. ? ?# PN- 1-monitor closely on systemic therapy. ? ?# Radiation Mucositis- G-2- Poor taste/dysphagia- s/p evaluation with [facility]. Continue with carafate [per radiation]; recommend magic mouthwash ? ?# Smoking:active smoker-quit smoking.  ? ?*magic mouth wash ?# DISPOSITION: ?# chemo today;  ?# 1 week- labs; cbc/cmp; carbo-taxol weekly ?# follow up in 2 weeks; MD labs- cbc/cmp;carbo-Taxol; weekly- Dr.B ? ?Cc; PCP/Dr.Khan,cards ? ? ?

## 2021-11-05 NOTE — Patient Instructions (Signed)
[facility] CANCER CTR AT [place]-MEDICAL ONCOLOGY  Discharge Instructions: Thank you for choosing [facility] to provide your oncology and hematology care.  If you have a lab appointment with the Cancer Center, please go directly to the Cancer Center and check in at the registration area.  Wear comfortable clothing and clothing appropriate for easy access to any Portacath or PICC line.   We strive to give you quality time with your provider. You may need to reschedule your appointment if you arrive late (15 or more minutes).  Arriving late affects you and other patients whose appointments are after yours.  Also, if you miss three or more appointments without notifying the office, you may be dismissed from the clinic at the provider's discretion.      For prescription refill requests, have your pharmacy contact our office and allow 72 hours for refills to be completed.    Today you received the following chemotherapy and/or immunotherapy agents Taxol, Carboplatin    To help prevent nausea and vomiting after your treatment, we encourage you to take your nausea medication as directed.  BELOW ARE SYMPTOMS THAT SHOULD BE REPORTED IMMEDIATELY: *FEVER GREATER THAN 100.4 F (38 C) OR HIGHER *CHILLS OR SWEATING *NAUSEA AND VOMITING THAT IS NOT CONTROLLED WITH YOUR NAUSEA MEDICATION *UNUSUAL SHORTNESS OF BREATH *UNUSUAL BRUISING OR BLEEDING *URINARY PROBLEMS (pain or burning when urinating, or frequent urination) *BOWEL PROBLEMS (unusual diarrhea, constipation, pain near the anus) TENDERNESS IN MOUTH AND THROAT WITH OR WITHOUT PRESENCE OF ULCERS (sore throat, sores in mouth, or a toothache) UNUSUAL RASH, SWELLING OR PAIN  UNUSUAL VAGINAL DISCHARGE OR ITCHING   Items with * indicate a potential emergency and should be followed up as soon as possible or go to the Emergency Department if any problems should occur.  Please show the CHEMOTHERAPY ALERT CARD or IMMUNOTHERAPY ALERT CARD at  check-in to the Emergency Department and triage nurse.  Should you have questions after your visit or need to cancel or reschedule your appointment, please contact [facility] CANC[facility] AT [place]-MEDICAL ONCOLOGY  [phone] and follow the prompts.  Office hours are 8:00 a.m. to 4:30 p.m. Monday - Friday. Please note that voicemails left after 4:00 p.m. may not be returned until the following business day.  We are closed weekends and major holidays. You have access to a nurse at all times for urgent questions. Please call the main number to the clinic [phone] and follow the prompts.  For any non-urgent questions, you may also contact your provider using MyChart. We now offer e-Visits for anyone 18 and older to request care online for non-urgent symptoms. For details visit [email or web address].   Also download the MyChart app! Go to the app store, search "MyChart", open the app, select [place], and log in with your MyChart username and password.  Due to Covid, a mask is required upon entering the hospital/clinic. If you do not have a mask, one will be given to you upon arrival. For doctor visits, patients may have 1 support person aged 18 or older with them. For treatment visits, patients cannot have anyone with them due to current Covid guidelines and our immunocompromised population.  

## 2021-11-06 ENCOUNTER — Other Ambulatory Visit: Payer: Self-pay

## 2021-11-06 ENCOUNTER — Telehealth: Payer: Self-pay | Admitting: *Deleted

## 2021-11-06 ENCOUNTER — Ambulatory Visit
Admission: RE | Admit: 2021-11-06 | Discharge: 2021-11-06 | Disposition: A | Discharge status: Home / Self Care | Payer: Medicare Other | Source: Ambulatory Visit | Attending: Radiation Oncology | Admitting: Radiation Oncology

## 2021-11-06 DIAGNOSIS — Z510 Encounter for antineoplastic radiation therapy: Secondary | ICD-10-CM | POA: Diagnosis not present

## 2021-11-06 LAB — RAD ONC ARIA SESSION SUMMARY
Course Elapsed Days: 29
Plan Fractions Treated to Date: 18
Plan Prescribed Dose Per Fraction: 2 Gy
Plan Total Fractions Prescribed: 31
Plan Total Prescribed Dose: 62 Gy
Reference Point Dosage Given to Date: 44 Gy
Reference Point Session Dosage Given: 2 Gy
Session Number: 22

## 2021-11-06 NOTE — Telephone Encounter (Signed)
Wife called stating prescription   for Magic Mouth Wash was to have been sent to [facility] for patient yesterday, but it has not been received by pharmacy Please fax prescription  ? ?  ?# Radiation Mucositis- G-2- Poor taste/dysphagia- s/p evaluation with [facility]. Continue with carafate [per radiation]; recommend magic mouthwash ?  ?# Smoking:active smoker-quit smoking.  ? ?*magic mouth wash ?# DISPOSITION: ?# chemo today;  ?# 1 week- labs; cbc/cmp; carbo-taxol weekly ?# follow up in 2 weeks; MD labs- cbc/cmp;carbo-Taxol; weekly- Dr.B ?  ?Cc; PCP/Dr.Khan,cards ?  ?  ?

## 2021-11-06 NOTE — Telephone Encounter (Signed)
Confirmed with [facility] that the rx was received yesterday but has not been mixed yet due to not being a compound pharmacy and [employer].  The rx could be sent to [facility] which is a Architect and can run rx thru insurance.  If they decide to get medication from [facility] the price will be $20 for 4 oz or $80 for the 480oz. ? ?Call returned to patient's wife but no answer and unable to leave a voicemail.  Please relay the message if she calls again. ? ? ?

## 2021-11-06 NOTE — Telephone Encounter (Signed)
Unsuccessful attempt at contacting call patient's wife again. ?

## 2021-11-09 ENCOUNTER — Emergency Department: Payer: Medicare Other

## 2021-11-09 ENCOUNTER — Other Ambulatory Visit: Payer: Self-pay

## 2021-11-09 ENCOUNTER — Inpatient Hospital Stay: Payer: Medicare Other

## 2021-11-09 ENCOUNTER — Inpatient Hospital Stay
Admission: EM | Admit: 2021-11-09 | Discharge: 2021-12-10 | DRG: 871 | Disposition: E | Payer: Medicare Other | Attending: Internal Medicine | Admitting: Internal Medicine

## 2021-11-09 ENCOUNTER — Inpatient Hospital Stay
Admit: 2021-11-09 | Discharge: 2021-11-09 | Disposition: A | Discharge status: Home / Self Care | Payer: Medicare Other | Attending: Internal Medicine | Admitting: Internal Medicine

## 2021-11-09 ENCOUNTER — Ambulatory Visit: Payer: Medicare Other

## 2021-11-09 DIAGNOSIS — T451X5A Adverse effect of antineoplastic and immunosuppressive drugs, initial encounter: Secondary | ICD-10-CM | POA: Diagnosis present

## 2021-11-09 DIAGNOSIS — E872 Acidosis, unspecified: Secondary | ICD-10-CM | POA: Diagnosis present

## 2021-11-09 DIAGNOSIS — Z981 Arthrodesis status: Secondary | ICD-10-CM

## 2021-11-09 DIAGNOSIS — Z66 Do not resuscitate: Secondary | ICD-10-CM | POA: Diagnosis not present

## 2021-11-09 DIAGNOSIS — I248 Other forms of acute ischemic heart disease: Secondary | ICD-10-CM | POA: Diagnosis present

## 2021-11-09 DIAGNOSIS — D701 Agranulocytosis secondary to cancer chemotherapy: Secondary | ICD-10-CM

## 2021-11-09 DIAGNOSIS — I468 Cardiac arrest due to other underlying condition: Secondary | ICD-10-CM | POA: Diagnosis present

## 2021-11-09 DIAGNOSIS — M545 Low back pain, unspecified: Secondary | ICD-10-CM | POA: Diagnosis present

## 2021-11-09 DIAGNOSIS — C779 Secondary and unspecified malignant neoplasm of lymph node, unspecified: Secondary | ICD-10-CM | POA: Diagnosis present

## 2021-11-09 DIAGNOSIS — C140 Malignant neoplasm of pharynx, unspecified: Secondary | ICD-10-CM | POA: Diagnosis present

## 2021-11-09 DIAGNOSIS — R6521 Severe sepsis with septic shock: Secondary | ICD-10-CM | POA: Diagnosis present

## 2021-11-09 DIAGNOSIS — N179 Acute kidney failure, unspecified: Secondary | ICD-10-CM | POA: Diagnosis present

## 2021-11-09 DIAGNOSIS — I2510 Atherosclerotic heart disease of native coronary artery without angina pectoris: Secondary | ICD-10-CM | POA: Diagnosis present

## 2021-11-09 DIAGNOSIS — F419 Anxiety disorder, unspecified: Secondary | ICD-10-CM | POA: Diagnosis present

## 2021-11-09 DIAGNOSIS — Y842 Radiological procedure and radiotherapy as the cause of abnormal reaction of the patient, or of later complication, without mention of misadventure at the time of the procedure: Secondary | ICD-10-CM | POA: Diagnosis present

## 2021-11-09 DIAGNOSIS — E162 Hypoglycemia, unspecified: Secondary | ICD-10-CM | POA: Diagnosis present

## 2021-11-09 DIAGNOSIS — Z79899 Other long term (current) drug therapy: Secondary | ICD-10-CM

## 2021-11-09 DIAGNOSIS — G894 Chronic pain syndrome: Secondary | ICD-10-CM | POA: Diagnosis present

## 2021-11-09 DIAGNOSIS — I255 Ischemic cardiomyopathy: Secondary | ICD-10-CM | POA: Diagnosis present

## 2021-11-09 DIAGNOSIS — J449 Chronic obstructive pulmonary disease, unspecified: Secondary | ICD-10-CM | POA: Diagnosis present

## 2021-11-09 DIAGNOSIS — D709 Neutropenia, unspecified: Secondary | ICD-10-CM | POA: Diagnosis present

## 2021-11-09 DIAGNOSIS — F17210 Nicotine dependence, cigarettes, uncomplicated: Secondary | ICD-10-CM | POA: Diagnosis present

## 2021-11-09 DIAGNOSIS — D696 Thrombocytopenia, unspecified: Secondary | ICD-10-CM | POA: Diagnosis present

## 2021-11-09 DIAGNOSIS — Z825 Family history of asthma and other chronic lower respiratory diseases: Secondary | ICD-10-CM

## 2021-11-09 DIAGNOSIS — I5022 Chronic systolic (congestive) heart failure: Secondary | ICD-10-CM | POA: Diagnosis present

## 2021-11-09 DIAGNOSIS — Z20822 Contact with and (suspected) exposure to covid-19: Secondary | ICD-10-CM | POA: Diagnosis present

## 2021-11-09 DIAGNOSIS — T451X5S Adverse effect of antineoplastic and immunosuppressive drugs, sequela: Secondary | ICD-10-CM

## 2021-11-09 DIAGNOSIS — I130 Hypertensive heart and chronic kidney disease with heart failure and stage 1 through stage 4 chronic kidney disease, or unspecified chronic kidney disease: Secondary | ICD-10-CM | POA: Diagnosis present

## 2021-11-09 DIAGNOSIS — M109 Gout, unspecified: Secondary | ICD-10-CM | POA: Diagnosis present

## 2021-11-09 DIAGNOSIS — Z7951 Long term (current) use of inhaled steroids: Secondary | ICD-10-CM

## 2021-11-09 DIAGNOSIS — E785 Hyperlipidemia, unspecified: Secondary | ICD-10-CM | POA: Diagnosis present

## 2021-11-09 DIAGNOSIS — K1231 Oral mucositis (ulcerative) due to antineoplastic therapy: Secondary | ICD-10-CM | POA: Diagnosis present

## 2021-11-09 DIAGNOSIS — K1233 Oral mucositis (ulcerative) due to radiation: Secondary | ICD-10-CM | POA: Diagnosis present

## 2021-11-09 DIAGNOSIS — C801 Malignant (primary) neoplasm, unspecified: Secondary | ICD-10-CM | POA: Diagnosis present

## 2021-11-09 DIAGNOSIS — E860 Dehydration: Secondary | ICD-10-CM

## 2021-11-09 DIAGNOSIS — A419 Sepsis, unspecified organism: Secondary | ICD-10-CM | POA: Diagnosis present

## 2021-11-09 DIAGNOSIS — Z515 Encounter for palliative care: Secondary | ICD-10-CM | POA: Diagnosis not present

## 2021-11-09 DIAGNOSIS — N183 Chronic kidney disease, stage 3 unspecified: Secondary | ICD-10-CM | POA: Diagnosis present

## 2021-11-09 DIAGNOSIS — C7989 Secondary malignant neoplasm of other specified sites: Secondary | ICD-10-CM | POA: Diagnosis present

## 2021-11-09 DIAGNOSIS — R680 Hypothermia, not associated with low environmental temperature: Secondary | ICD-10-CM | POA: Diagnosis present

## 2021-11-09 DIAGNOSIS — I739 Peripheral vascular disease, unspecified: Secondary | ICD-10-CM | POA: Diagnosis present

## 2021-11-09 DIAGNOSIS — Z885 Allergy status to narcotic agent status: Secondary | ICD-10-CM

## 2021-11-09 DIAGNOSIS — K219 Gastro-esophageal reflux disease without esophagitis: Secondary | ICD-10-CM | POA: Diagnosis present

## 2021-11-09 DIAGNOSIS — R531 Weakness: Principal | ICD-10-CM

## 2021-11-09 DIAGNOSIS — R778 Other specified abnormalities of plasma proteins: Secondary | ICD-10-CM | POA: Diagnosis present

## 2021-11-09 LAB — CBC WITH DIFFERENTIAL/PLATELET
Abs Immature Granulocytes: 0 10*3/uL (ref 0.00–0.07)
Basophils Absolute: 0 10*3/uL (ref 0.0–0.1)
Basophils Relative: 0 %
Eosinophils Absolute: 0 10*3/uL (ref 0.0–0.5)
Eosinophils Relative: 0 %
HCT: 38.6 % — ABNORMAL LOW (ref 39.0–52.0)
Hemoglobin: 12.6 g/dL — ABNORMAL LOW (ref 13.0–17.0)
Immature Granulocytes: 0 %
Lymphocytes Relative: 13 %
Lymphs Abs: 0 10*3/uL — ABNORMAL LOW (ref 0.7–4.0)
MCH: 27.8 pg (ref 26.0–34.0)
MCHC: 32.6 g/dL (ref 30.0–36.0)
MCV: 85.2 fL (ref 80.0–100.0)
Monocytes Absolute: 0 10*3/uL — ABNORMAL LOW (ref 0.1–1.0)
Monocytes Relative: 13 %
Neutro Abs: 0.1 10*3/uL — CL (ref 1.7–7.7)
Neutrophils Relative %: 74 %
Platelets: 103 10*3/uL — ABNORMAL LOW (ref 150–400)
RBC: 4.53 MIL/uL (ref 4.22–5.81)
RDW: 15.8 % — ABNORMAL HIGH (ref 11.5–15.5)
Smear Review: NORMAL
WBC: <0.1 10*3/uL — CL (ref 4.0–10.5)
nRBC: 25 % — ABNORMAL HIGH (ref 0.0–0.2)

## 2021-11-09 LAB — ECHOCARDIOGRAM COMPLETE
AR max vel: 1.89 cm2
AV Area VTI: 2.2 cm2
AV Area mean vel: 1.4 cm2
AV Mean grad: 3 mmHg
AV Peak grad: 4.9 mmHg
Ao pk vel: 1.11 m/s
Area-P 1/2: 5.46 cm2
Height: 67 in
MV VTI: 1.38 cm2
S' Lateral: 5.17 cm
Weight: 2272 oz

## 2021-11-09 LAB — TROPONIN I (HIGH SENSITIVITY)
Troponin I (High Sensitivity): 121 ng/L (ref <18)
Troponin I (High Sensitivity): 134 ng/L (ref <18)
Troponin I (High Sensitivity): 149 ng/L (ref <18)

## 2021-11-09 LAB — CK: Total CK: 205 U/L (ref 49–397)

## 2021-11-09 LAB — COMPREHENSIVE METABOLIC PANEL
ALT: 22 U/L (ref 0–44)
AST: 50 U/L — ABNORMAL HIGH (ref 15–41)
Albumin: 2.6 g/dL — ABNORMAL LOW (ref 3.5–5.0)
Alkaline Phosphatase: 39 U/L (ref 38–126)
Anion gap: 9 (ref 5–15)
BUN: 49 mg/dL — ABNORMAL HIGH (ref 8–23)
CO2: 24 mmol/L (ref 22–32)
Calcium: 7.2 mg/dL — ABNORMAL LOW (ref 8.9–10.3)
Chloride: 98 mmol/L (ref 98–111)
Creatinine, Ser: 1.63 mg/dL — ABNORMAL HIGH (ref 0.61–1.24)
GFR, Estimated: 47 mL/min — ABNORMAL LOW (ref >60)
Glucose, Bld: 95 mg/dL (ref 70–99)
Potassium: 4.2 mmol/L (ref 3.5–5.1)
Sodium: 131 mmol/L — ABNORMAL LOW (ref 135–145)
Total Bilirubin: 1.9 mg/dL — ABNORMAL HIGH (ref 0.3–1.2)
Total Protein: 4.7 g/dL — ABNORMAL LOW (ref 6.5–8.1)

## 2021-11-09 LAB — LACTIC ACID, PLASMA
Lactic Acid, Venous: 1.9 mmol/L (ref 0.5–1.9)
Lactic Acid, Venous: 3.7 mmol/L (ref 0.5–1.9)
Lactic Acid, Venous: 2.9 mmol/L (ref 0.5–1.9)

## 2021-11-09 LAB — PROCALCITONIN: Procalcitonin: 114.03 ng/mL

## 2021-11-09 LAB — CBG MONITORING, ED
Glucose-Capillary: 30 mg/dL — CL (ref 70–99)
Glucose-Capillary: 80 mg/dL (ref 70–99)

## 2021-11-09 LAB — RESP PANEL BY RT-PCR (FLU A&B, COVID) ARPGX2
Influenza A by PCR: NEGATIVE
Influenza B by PCR: NEGATIVE
SARS Coronavirus 2 by RT PCR: NEGATIVE

## 2021-11-09 MED ORDER — SUCRALFATE 1 G PO TABS
1.0000 g | ORAL_TABLET | Freq: Three times a day (TID) | ORAL | Status: DC
  Filled 2021-11-09: qty 1

## 2021-11-09 MED ORDER — SODIUM CHLORIDE 0.9 % IV SOLN
2.0000 g | Freq: Two times a day (BID) | INTRAVENOUS | Status: DC
  Administered 2021-11-09: 2 g via INTRAVENOUS
  Filled 2021-11-09: qty 12.5

## 2021-11-09 MED ORDER — TBO-FILGRASTIM 300 MCG/0.5ML ~~LOC~~ SOSY
300.0000 ug | PREFILLED_SYRINGE | Freq: Every day | SUBCUTANEOUS | Status: DC
  Filled 2021-11-09: qty 0.5

## 2021-11-09 MED ORDER — ACETAMINOPHEN 325 MG RE SUPP: 650.0000 mg | Freq: Four times a day (QID) | RECTAL | Status: DC | PRN

## 2021-11-09 MED ORDER — LACTATED RINGERS IV BOLUS
1000.0000 mL | Freq: Once | INTRAVENOUS | Status: AC
  Administered 2021-11-09: 1000 mL via INTRAVENOUS

## 2021-11-09 MED ORDER — ACETAMINOPHEN 10 MG/ML IV SOLN
1000.0000 mg | Freq: Once | INTRAVENOUS | Status: DC
  Filled 2021-11-09: qty 100

## 2021-11-09 MED ORDER — ENOXAPARIN SODIUM 40 MG/0.4ML IJ SOSY: 40.0000 mg | PREFILLED_SYRINGE | INTRAMUSCULAR | Status: DC

## 2021-11-09 MED ORDER — UMECLIDINIUM BROMIDE 62.5 MCG/ACT IN AEPB: 1.0000 | INHALATION_SPRAY | Freq: Every day | RESPIRATORY_TRACT | Status: DC

## 2021-11-09 MED ORDER — HYDROCODONE-ACETAMINOPHEN 7.5-325 MG/15ML PO SOLN: 15.0000 mL | ORAL | Status: DC | PRN

## 2021-11-09 MED ORDER — ONDANSETRON HCL 4 MG/2ML IJ SOLN
4.0000 mg | Freq: Four times a day (QID) | INTRAMUSCULAR | Status: DC | PRN
Start: 2021-11-09 — End: 2021-11-10
  Administered 2021-11-09: 4 mg via INTRAVENOUS
  Filled 2021-11-09: qty 2

## 2021-11-09 MED ORDER — ALPRAZOLAM 0.5 MG PO TABS
1.0000 mg | ORAL_TABLET | Freq: Three times a day (TID) | ORAL | Status: DC
  Filled 2021-11-09: qty 2

## 2021-11-09 MED ORDER — FLUTICASONE FUROATE-VILANTEROL 100-25 MCG/ACT IN AEPB: 1.0000 | INHALATION_SPRAY | Freq: Every day | RESPIRATORY_TRACT | Status: DC

## 2021-11-09 MED ORDER — ROCURONIUM BROMIDE 10 MG/ML (PF) SYRINGE
PREFILLED_SYRINGE | INTRAVENOUS | Status: AC
  Filled 2021-11-09: qty 10

## 2021-11-09 MED ORDER — LIDOCAINE-PRILOCAINE 2.5-2.5 % EX CREA: TOPICAL_CREAM | Freq: Once | CUTANEOUS | Status: DC | PRN

## 2021-11-09 MED ORDER — MAGIC MOUTHWASH W/LIDOCAINE
5.0000 mL | Freq: Four times a day (QID) | ORAL | Status: DC | PRN
  Filled 2021-11-09: qty 5

## 2021-11-09 MED ORDER — IOHEXOL 300 MG/ML  SOLN
50.0000 mL | Freq: Once | INTRAMUSCULAR | Status: AC | PRN
  Administered 2021-11-09: 50 mL via INTRAVENOUS

## 2021-11-09 MED ORDER — ETOMIDATE 2 MG/ML IV SOLN
INTRAVENOUS | Status: AC
  Filled 2021-11-09: qty 10

## 2021-11-09 MED ORDER — NOREPINEPHRINE 4 MG/250ML-% IV SOLN: 2.0000 ug/min | INTRAVENOUS | Status: DC

## 2021-11-09 MED ORDER — DEXAMETHASONE 0.5 MG PO TABS
2.0000 mg | ORAL_TABLET | Freq: Two times a day (BID) | ORAL | Status: DC
  Filled 2021-11-09: qty 4

## 2021-11-09 MED ORDER — LACTATED RINGERS IV SOLN: INTRAVENOUS | Status: AC

## 2021-11-09 MED ORDER — PANTOPRAZOLE SODIUM 40 MG IV SOLR
40.0000 mg | Freq: Every day | INTRAVENOUS | Status: DC
  Administered 2021-11-09: 40 mg via INTRAVENOUS
  Filled 2021-11-09: qty 10

## 2021-11-09 MED ORDER — ACETAMINOPHEN 325 MG PO TABS: 650.0000 mg | ORAL_TABLET | Freq: Four times a day (QID) | ORAL | Status: DC | PRN

## 2021-11-09 MED ORDER — SODIUM CHLORIDE 0.9 % IV SOLN
250.0000 mL | INTRAVENOUS | Status: DC
Start: 2021-11-09 — End: 2021-11-10

## 2021-11-09 MED ORDER — ONDANSETRON HCL 4 MG PO TABS: 4.0000 mg | ORAL_TABLET | Freq: Four times a day (QID) | ORAL | Status: DC | PRN

## 2021-11-10 ENCOUNTER — Ambulatory Visit: Payer: Medicare Other

## 2021-11-10 LAB — BLOOD CULTURE ID PANEL (REFLEXED) - BCID2

## 2021-11-11 ENCOUNTER — Ambulatory Visit: Payer: Medicare Other

## 2021-11-11 LAB — CBG MONITORING, ED: Glucose-Capillary: 254 mg/dL — ABNORMAL HIGH (ref 70–99)

## 2021-11-12 ENCOUNTER — Inpatient Hospital Stay: Payer: Medicare Other

## 2021-11-12 ENCOUNTER — Ambulatory Visit: Payer: Medicare Other

## 2021-11-13 ENCOUNTER — Ambulatory Visit: Payer: Medicare Other

## 2021-11-14 LAB — CULTURE, BLOOD (ROUTINE X 2): Culture: NO GROWTH

## 2021-11-16 ENCOUNTER — Ambulatory Visit: Payer: Medicare Other

## 2021-11-17 ENCOUNTER — Ambulatory Visit: Payer: Medicare Other

## 2021-11-18 ENCOUNTER — Ambulatory Visit: Payer: Medicare Other

## 2021-11-19 ENCOUNTER — Other Ambulatory Visit: Payer: Medicare Other

## 2021-11-19 ENCOUNTER — Ambulatory Visit: Payer: Medicare Other | Admitting: Internal Medicine

## 2021-11-19 ENCOUNTER — Ambulatory Visit: Payer: Medicare Other

## 2021-11-20 ENCOUNTER — Ambulatory Visit: Payer: Medicare Other

## 2021-11-23 ENCOUNTER — Ambulatory Visit: Payer: Medicare Other

## 2021-11-24 ENCOUNTER — Ambulatory Visit: Payer: Medicare Other

## 2021-11-25 ENCOUNTER — Ambulatory Visit: Payer: Medicare Other

## 2021-11-26 ENCOUNTER — Ambulatory Visit: Payer: Medicare Other

## 2021-12-10 NOTE — ED Triage Notes (Signed)
Pt is currently getting radiation and chemo for throat CA, pt c/o loss of appetite, hurts to eat or swallow, generalized weakness with body aches ?

## 2021-12-10 NOTE — Assessment & Plan Note (Signed)
Stable and not acutely exacerbated Continue as needed bronchodilator therapy as well as inhaled steroids 

## 2021-12-10 NOTE — Progress Notes (Signed)
Progress Notes ? ?Arrived at bedside pt with Mertie Clause Device in place faint femoral pulses present and pt with agonal respiration on ventilator; map >65.  Instructed RN to notify ICU team when pts family arrived at bedside.  Contacted by Mable Fill in the ER informed pts wife arrived at the hospital, and waiting in family conference room in the ER.  Upon arrival at pts bedside CPR and ACLS protocol in progress by ER provider Dr. Jacqualine Code and Dr. Francine Graven in conference room with pts wife along with additional family members discussing code status and goals of care.  Briefly got a pulse back, however lost pulse again CPR and ACLS protocol resumed.  Pts wife arrived at bedside and decided to change code status to DNR.  Got pulse back again and pt transitioned to comfort care per wife's request by primary physician Dr. Francine Graven.  ? ?Donell Beers, AGNP  ?Pulmonary/Critical Care ?Pager [phone] (please enter 7 digits) ?PCCM Consult Pager [phone] (please enter 7 digits)  ?

## 2021-12-10 NOTE — ED Notes (Signed)
Wife at bedside. Wife informed of pt's care. Wife states if he looses pulse let him go. ?

## 2021-12-10 NOTE — ED Notes (Signed)
Pt has pulses ? ?

## 2021-12-10 NOTE — ED Notes (Signed)
Pt coded student at bedside started CPR. MD at bedside ? ? ?

## 2021-12-10 NOTE — ED Notes (Signed)
Pt given urinal to collect specimen if able ?

## 2021-12-10 NOTE — ED Notes (Addendum)
No pulses cpr restarted [name] ?

## 2021-12-10 NOTE — ED Notes (Signed)
Pt placed on 2L [place] for comfort.

## 2021-12-10 NOTE — ED Notes (Signed)
Pulse check at this time, pulse present ?

## 2021-12-10 NOTE — Assessment & Plan Note (Signed)
Most likely related to recent chemotherapy ?Monitor closely during this hospitalization ?Hold off on pharmacologic DVT prophylaxis due to increased risk for bleeding ?

## 2021-12-10 NOTE — Assessment & Plan Note (Signed)
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

## 2021-12-10 NOTE — ED Notes (Signed)
Pulse check and pulse present. ? ?

## 2021-12-10 NOTE — ED Notes (Signed)
2 grams Mag given at this time. ?

## 2021-12-10 NOTE — Assessment & Plan Note (Signed)
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 ?Continue Carafate and Magic mouthwash ? ?

## 2021-12-10 NOTE — ED Notes (Signed)
Pt intubated at this time.   

## 2021-12-10 NOTE — ED Notes (Signed)
1 epi given at this time. ?

## 2021-12-10 NOTE — Assessment & Plan Note (Signed)
Appears to be multifactorial related to poor oral intake, GI losses from diarrhea and medication induced. ?Place patient on IV fluids ?Hold Lasix and Entresto ?Encourage oral intake ?Repeat renal parameters in a.m. ?

## 2021-12-10 NOTE — ED Notes (Signed)
Pulse check at this time. Pulses present ? ?

## 2021-12-10 NOTE — Assessment & Plan Note (Addendum)
Stable and not acutely exacerbate ?Patient's last known LVEF is less than 20% from a 2D echocardiogram which was done 06/22 ?Hold Entresto, Lasix and carvedilol due to AKI and relative hypotension ?Monitor respiratory status closely ?

## 2021-12-10 NOTE — ED Notes (Signed)
CPR restarted [name] placed at this time. ?

## 2021-12-10 NOTE — ED Notes (Signed)
All lines, drains removed, pts right great toe tagged with label and pt transitioned into body bag. Bag secured by zipper and labeled tag placed on outside. Transport in route to move pt to morgue.  ?

## 2021-12-10 NOTE — ED Notes (Signed)
Family requests terminal extubation. Pt being extubated at this time. [name] removed. ?

## 2021-12-10 NOTE — ED Notes (Signed)
Pt deceased at this time. MD Agbata at bedside. Pt pronounced at this time.  ?

## 2021-12-10 NOTE — ED Notes (Signed)
MD Malinda informed of lactic acid result of 3.7 ?

## 2021-12-10 NOTE — ED Notes (Signed)
Pulse check no pulse cpr restarted ? ?

## 2021-12-10 NOTE — ED Notes (Signed)
Pt had loose stool and was unable to get to bathroom in time. Pt assisted to toilet with wife at bedside. Pt cleaned up and placed in clean gown, socks and clean linens placed on bed. Wife instructed to use call bell once pt was ready to get back into bed. ?

## 2021-12-10 NOTE — ED Provider Notes (Signed)
? [facility] ?Provider Note ? ? ? Event Date/Time  ? First MD Initiated Contact with Patient Dec 06, 2021 [phone]   ?  (approximate) ? ? ?History  ? ?Weakness ? ? ?HPI ? ?JAISHAUN MCNAB is a 66 y.o. male with throat cancer who says he has not been able to swallow anything.  Even liquid just come back up last day or 2.  He says his tongue feels dry.  He says he is aching all over and feels bad and has not been able to sleep for 2 days. ? ?  ? ? ?Physical Exam  ? ?Triage Vital Signs: ?ED Triage Vitals  ?Enc Vitals Group  ?   BP Dec 06, 2021 0903 (!) 81/68  ?   Pulse Rate 06-Dec-2021 0903 93  ?   Resp 2021-12-06 0903 18  ?   Temp 2021-12-06 0903 98.3 ?F (36.8 ?C)  ?   Temp Source 12/06/21 0903 Oral  ?   SpO2 Dec 06, 2021 0903 94 %  ?   Weight 12/06/2021 0904 142 lb (64.4 kg)  ?   Height 12-06-2021 0904 '5\' 7"'$  (1.702 m)  ?   Head Circumference --   ?   Peak Flow --   ?   Pain Score 2021-12-06 0904 10  ?   Pain Loc --   ?   Pain Edu? --   ?   Excl. in [place]? --   ? ? ?Most recent vital signs: ?Vitals:  ? 12-06-2021 1445 06-Dec-2021 1515  ?BP:    ?Pulse: (!) 105 (!) 108  ?Resp: (!) 22 (!) 24  ?Temp:    ?SpO2: 93% 100%  ? ? ? ?General: Awake, no distress.  ?Mouth is moist tongue with possibly somewhat of atrophic ?Neck is supple there is a tender spot on the left side of the neck there is no stridor ?CV:  Good peripheral perfusion.  ?Resp:  Normal effort.  Lungs are clear ?Abd:  No distention.  Abdomen soft bowel sounds are positive there is no tenderness organomegaly ?Extremities no pain the left leg is somewhat swollen this apparently came on after a gouty attack but has not resolved in the tachycardia has been resolved. ? ? ?ED Results / Procedures / Treatments  ? ?Labs ?(all labs ordered are listed, but only abnormal results are displayed) ?Labs Reviewed  ?COMPREHENSIVE METABOLIC PANEL - Abnormal; Notable for the following components:  ?    Result Value  ? Sodium 131 (*)   ? BUN 49 (*)   ? Creatinine, Ser 1.63 (*)   ? Calcium 7.2  (*)   ? Total Protein 4.7 (*)   ? Albumin 2.6 (*)   ? AST 50 (*)   ? Total Bilirubin 1.9 (*)   ? GFR, Estimated 47 (*)   ? All other components within normal limits  ?LACTIC ACID, PLASMA - Abnormal; Notable for the following components:  ? Lactic Acid, Venous 3.7 (*)   ? All other components within normal limits  ?CBC WITH DIFFERENTIAL/PLATELET - Abnormal; Notable for the following components:  ? WBC <0.1 (*)   ? Hemoglobin 12.6 (*)   ? HCT 38.6 (*)   ? RDW 15.8 (*)   ? Platelets 103 (*)   ? nRBC 25.0 (*)   ? Neutro Abs 0.1 (*)   ? Lymphs Abs 0.0 (*)   ? Monocytes Absolute 0.0 (*)   ? All other components within normal limits  ?TROPONIN I (HIGH SENSITIVITY) - Abnormal; Notable for the following components:  ?  Troponin I (High Sensitivity) 121 (*)   ? All other components within normal limits  ?TROPONIN I (HIGH SENSITIVITY) - Abnormal; Notable for the following components:  ? Troponin I (High Sensitivity) 134 (*)   ? All other components within normal limits  ?RESP PANEL BY RT-PCR (FLU A&B, COVID) [id number]  ?CULTURE, BLOOD (ROUTINE X 2)  ?CULTURE, BLOOD (ROUTINE X 2)  ?LACTIC ACID, PLASMA  ?CK  ?URINALYSIS, ROUTINE W REFLEX MICROSCOPIC  ?PROCALCITONIN  ?URINALYSIS, COMPLETE (UACMP) WITH MICROSCOPIC  ?LACTIC ACID, PLASMA  ?LACTIC ACID, PLASMA  ?TROPONIN I (HIGH SENSITIVITY)  ? ? ? ?EKG ? ?EKG read and interpreted by me shows sinus tachycardia rate of 109 rightward axis left bundle branch block no acute ST-T changes looks similar to EKG from March but without the T wave inversions present then ? ? ?RADIOLOGY ? ?Chest x-ray and CT scan of the throat read by radiology as no acute disease I have reviewed the films and agree. ?PROCEDURES: ? ?Critical Care performed: Critical care time 15 minutes.  This includes discussing the patient via text with Dr. Reviewed the oncologist and Dr. Rogue Bussing his regular oncologist as well as with the hospitalist.  I also spoke to patient's wife at length and evaluated the patient and  reviewed his findings.  Patient initially did not appear to be septic but after the second lactic acid came back normal hospitalist was seen the patient then I agree completely with his getting antibiotics for possible sepsis. ? ?Procedures ? ? ?MEDICATIONS ORDERED IN ED: ?Medications  ?ceFEPIme (MAXIPIME) 2 g in sodium chloride 0.9 % 100 mL IVPB (0 g Intravenous Stopped 11/27/21 1519)  ?ALPRAZolam (XANAX) tablet 1 mg (has no administration in time range)  ?dexamethasone (DECADRON) tablet 2 mg (has no administration in time range)  ?magic mouthwash w/lidocaine (has no administration in time range)  ?sucralfate (CARAFATE) tablet 1 g (has no administration in time range)  ?fluticasone furoate-vilanterol (BREO ELLIPTA) 100-25 MCG/ACT 1 puff (has no administration in time range)  ?  And  ?umeclidinium bromide (INCRUSE ELLIPTA) 62.5 MCG/ACT 1 puff (has no administration in time range)  ?lidocaine-prilocaine (EMLA) cream (has no administration in time range)  ?ondansetron (ZOFRAN) tablet 4 mg (has no administration in time range)  ?  Or  ?ondansetron (ZOFRAN) injection 4 mg (has no administration in time range)  ?acetaminophen (TYLENOL) tablet 650 mg (has no administration in time range)  ?  Or  ?acetaminophen (TYLENOL) suppository 650 mg (has no administration in time range)  ?lactated ringers infusion (has no administration in time range)  ?pantoprazole (PROTONIX) injection 40 mg (40 mg Intravenous Given Nov 27, 2021 1400)  ?Tbo-Filgrastim (GRANIX) injection 300 mcg (has no administration in time range)  ?lactated ringers bolus 1,000 mL (0 mLs Intravenous Stopped 11/27/2021 1231)  ?iohexol (OMNIPAQUE) 300 MG/ML solution 50 mL (50 mLs Intravenous Contrast Given 27-Nov-2021 1058)  ?lactated ringers bolus 1,000 mL (0 mLs Intravenous Stopped 11-27-2021 1341)  ? ? ? ?IMPRESSION / MDM / ASSESSMENT AND PLAN / ED COURSE  ?I reviewed the triage vital signs and the nursing notes. ?Patient unable to tolerate p.o.  He has become dehydrated and  sustained AKI.  He is tachycardic.  He does have an area on the left tonsillar bed which is where it looks like a scar.  He reports his tonsils removed I was looking through the old records and cannot see exactly when.  Patient himself says it was done in the beginning of April. ? ? ? ? ?  ? ? ?  FINAL CLINICAL IMPRESSION(S) / ED DIAGNOSES  ? ?Final diagnoses:  ?Weakness  ?AKI (acute kidney injury) ([place])  ?Dehydration  ? ? ? ?Rx / DC Orders  ? ?ED Discharge Orders   ? ? None  ? ?  ? ? ? ?Note:  This document was prepared using Dragon voice recognition software and may include unintentional dictation errors. ?  ?Nena Polio, MD ?11/14/21 1554 ? ?

## 2021-12-10 NOTE — Progress Notes (Signed)
?  2021/12/05 2100  ?Clinical Encounter Type  ?Visited With Family;Health care provider  ?Visit Type Initial;Code  ?Referral From Other (Comment)  ?Spiritual Encounters  ?Spiritual Needs Emotional;Grief support;Prayer  ? ?Chaplain Burris responded to code blue. Chaplain B offered non-anxious presence and bore witness to care. Chaplain B offered support to medic who was with Pt at time of cardiac arrest and offered compassionate presence and prayer for him. Chaplain B coordinated care needs with staff and met family as they arrived; escorted them to family consult room and notified physician who debriefed family on life extending measures and critical status. Daryel November offered her presence and support to family to help them process and cope with shock. Chaplain B offered active listening and grief support; accompanied family to bedside when appropriate. Chaplain B gathered family at bedside to offer prayer at family's request. Family was met by their pastor and Chaplain B met with additional family that arrived and finally assisted charge nurse to facilitate her collection of information for funeral home release. ? ?Family appreciative of this supportive intervention. ?

## 2021-12-10 NOTE — ED Notes (Addendum)
1 epi given at this time and 1 bicarb given ? ? ?

## 2021-12-10 NOTE — ED Notes (Signed)
Pulse check at this time. Pulses back ? ?

## 2021-12-10 NOTE — Progress Notes (Signed)
Notified by RN that [facility] had been called on the patient. ?Neutropenic patient admitted to the hospital for sepsis of unclear source, known cardiomyopathy with last known LVEF of less than 20% from 06/22 ?Per RN patient had gotten up to use the bathroom and was being assisted by the ED staff when he suddenly became unresponsive.  Initial rhythm was asystole. ?ACLS protocol was initiated at 5:51 PM.  Noted to be hypoglycemic with serum glucose of 30. ?Patient received a total of 3 doses of IV epinephrine, 2 amps of serum bicarbonate. ?He initially had ROSC but then lost pulses again.  ACLS protocol was reinitiated and after several rounds of CPR, a dose of IV magnesium, IV bicarb patient was noted to have ROSC at 6.11pm. ?Called and spoke to intensivist Dr Patsey Berthold and patient will be transferred to the ICU ?At 6:41 PM patient noted to be bradycardic and hypotensive.  No pulses palpable.  ACLS protocol initiated ?Patient received 3 dose of epi and started on a Levophed drip  ?Goals of care conversation was had in detail with patient's wife, dad and stepmother and after several conversations they finally decided to make patient a DNR and place him on comfort measures. ?All resuscitative measures were discontinued and patient terminally extubated. ?Time of death was 7:16 PM ?

## 2021-12-10 NOTE — H&P (Signed)
?History and Physical  ? ? ?Patient: [name]:[id number] DOB: April 24, 1956 ?DOA: 2021/11/15 ?DOS: the patient was seen and examined on 2021/11/15 ?PCP: Perrin Maltese, MD  ?Patient coming from: Home ? ?Chief Complaint:  ?Chief Complaint  ?Patient presents with  ? Weakness  ? ?HPI: [name] is a 66 y.o. 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  His last chemotherapy was 4 days prior to presentation and last radiation therapy was 3 days prior to his presentation. ?He complains of having multiple episodes of loose watery stools and denies any recent antibiotic use. ?He has abdominal pain mostly in the periumbilical area which he rates a 5 x 10 in intensity at its worst.  Pain is nonradiating.  Is it is associated with diarrhea but he denies having any nausea or vomiting.  He has had chills but denies having any fever. ?He denies having any chest pain, no shortness of breath no headache, no dizziness, no lightheadedness, no leg swelling, no blurred vision or any focal deficit. ? ?Review of Systems: As mentioned in the history of present illness. All other systems reviewed and are negative. ?Past Medical History:  ?Diagnosis Date  ? Anxiety   ? a.) on BZO therapy  ? Aortic atherosclerosis ([place])   ? Arthritis   ? knees, lower back  ? Asthma   ? Cardiomyopathy ([place])   ? a.) TTE 03/24/2004: EF 12%; sev LV dilation; severe global HK. b.) TTE 05/05/2011: EF 26%; LA/LV mod dilated; mild MR. c.) TTE 12/15/2020: EF <20%; mod-sev LV dilation; mod LA dilation; mild MR; G3DD.  ? Carotid artery stenosis   ? CHF (congestive  heart failure) ([place])   ? Chronic back pain   ? Chronic pain syndrome   ? CKD (chronic kidney disease), stage III ([place])   ? Complication of anesthesia   ? a.) difficulty maintaining anesthesia due to chronic opioid therapy; taking scheduled opioids QID  ? COPD (chronic obstructive pulmonary disease) ([employer])   ? Coronary artery disease   ? a.) LHC 04/04/2002 -> norm coronary anatomy; EF 20%.  b.)  Coronary CTA 07/28/2012: Ca score 143.4.  LAD, LCx, and RCA with no significant disease. c.) Coronary CTA 04/22/2015: Ca score 194.2; mild pLAD and LCx disease.  ? Diverticulitis   ? a.) s/p bowel resection  ? GERD (gastroesophageal reflux disease)   ? Gout   ? Hepatitis C virus infection without hepatic coma 02/2016  ? a.) genotype 1a;  Tx'd with Ledipasvir / Sofosbuvir course  ? History of ETOH abuse   ? History of kidney stones   ? HLD (hyperlipidemia)   ? Hypertension   ? LBBB (left bundle branch block)   ? Long term current use of antithrombotics/antiplatelets   ? a.) clopidogrel  ? Long term prescription benzodiazepine use   ? Long term prescription opiate use   ? Metastatic squamous cell carcinoma involving lymph node with unknown primary site [facility]) 08/14/2021  ? a.) FNA LEFT cervical lymph node 08/14/2021 -- (+) for metastatic squamous cell carcinoma; primary unknown. b.) PET CT 08/28/2021: LEFT cervical LAD --> 0.9 cm level 2 node (SUV 4.0), 1.2  cm level 3 node (SUV 5.2), smaller FDG avid cervical nodes noted, but too small to characterize.  ? Microcytic anemia   ? Prediabetes   ? PVD (peripheral vascular disease) with claudication ([place])   ? a.) 10/2019 --> s/p mechanical thrombectomy, PTCA, stent placement to RIGHT SFA and popliteal arteries  ? SBO (small bowel obstruction) ([place]) 2007  ? a.) x 2  ? Symptomatic bradycardia   ? ?Past Surgical History:  ?Procedure Laterality Date  ? BACK SURGERY    ? CHOLECYSTECTOMY    ? COLON RESECTION  2007  ? due to diverticulitis  ? COLONOSCOPY WITH PROPOFOL N/A 03/29/2017  ?  Procedure: COLONOSCOPY WITH PROPOFOL;  Surgeon: Lucilla Lame, MD;  [facility] END[facility]COPY;  Service: Endoscopy;  Laterality: N/A;  ? CYSTOSCOPY/URETEROSCOPY/HOLMIUM LASER/STENT PLACEMENT Left 07/16/2019  ? Procedure: CYSTOSCOPY/URETEROSCOPY/HOLMIUM LASER/STENT Exchange;  Surgeon: Hollice Espy, MD;  Location: [facility] ORS;  Service: Urology;  Laterality: Left;  ? ESOPHAGOGASTRODUODENOSCOPY (EGD) WITH PROPOFOL N/A 03/29/2017  ? Procedure: ESOPHAGOGASTRODUODENOSCOPY (EGD) WITH PROPOFOL;  Surgeon: Lucilla Lame, MD;  Location: [facility] ENDOSCOPY;  Service: Endoscopy;  Laterality: N/A;  ? FINGER SURGERY  2007  ? cut finger off with saw and was reattached  ? HERNIA REPAIR    ? HOLMIUM LASER APPLICATION N/A 77/82/4235  ? Procedure: HOLMIUM LASER APPLICATION;  Surgeon: Hollice Espy, MD;  Location: [facility] ORS;  Service: Urology;  Laterality: N/A;  ? IR IMAGING GUIDED PORT INSERTION  09/09/2021  ? IR NEPHROSTOMY PLACEMENT LEFT  05/28/2019  ? KIDNEY SURGERY  2021  ? KNEE ARTHROSCOPY Bilateral   ? LARYNGOSCOPY Bilateral 09/23/2021  ? Procedure: DIRECT LARYNGOSCOPY WITH BIOPSIES;  Surgeon: Clyde Canterbury, MD;  Location: [facility] ORS;  Service: ENT;  Laterality: Bilateral;  ? LEFT HEART CATH AND CORONARY ANGIOGRAPHY Left 04/04/2002  ? Procedure: LEFT HEART CATH AND CORONARY ANGIOGRAPHY; Location: [place]; Surgeon: Bartholome Bill, MD  ? LOWER EXTREMITY ANGIOGRAPHY Right 11/09/2017  ? Procedure: LOWER EXTREMITY ANGIOGRAPHY;  Surgeon: Katha Cabal, MD;  Location: [place] CV LAB;  Service: Cardiovascular;  Laterality: Right;  ? LOWER EXTREMITY ANGIOGRAPHY Right 11/21/2018  ? Procedure: LOWER EXTREMITY ANGIOGRAPHY;  Surgeon: Katha Cabal, MD;  Location: [place] CV LAB;  Service: Cardiovascular;  Laterality: Right;  ? LOWER EXTREMITY ANGIOGRAPHY Right 09/18/2019  ? Procedure: LOWER EXTREMITY ANGIOGRAPHY;  Surgeon: Katha Cabal, MD;  Location: [place] CV LAB;  Service: Cardiovascular;  Laterality: Right;  ?  NEPHROLITHOTOMY Left 05/28/2019  ? Procedure: NEPHROLITHOTOMY PERCUTANEOUS;  Surgeon: Hollice Espy, MD;  Location: [facility] ORS;  Service: Urology;  Laterality: Left;  ? SPINAL FUSION    ? TONSILLECTOMY AND ADENOIDECTOMY Left 09/23/2021  ? Procedure: TONSILLECTOMY AND ADENOIDECTOMY;  Surgeon: Clyde Canterbury, MD;  Location: [facility] ORS;  Service: ENT;  Laterality: Left;  ? ?Social History:  reports that he has been smoking cigarettes. He has a 12.50 pack-year smoking history. He has never used smokeless tobacco. He reports that he does not drink alcohol and does not use drugs. ? ?Allergies  ?Allergen Reactions  ? Codeine Itching  ? Dilaudid [Hydromorphone] Other (See Comments)  ?  Severe headache  ? ? ?Family History  ?Problem Relation Age of Onset  ? COPD Mother   ? ? ?Prior to Admission medications   ?Medication Sig Start Date End Date Taking? Authorizing Provider  ?albuterol (VENTOLIN HFA) 108 (90 Base) MCG/ACT inhaler Inhale 2 puffs into the lungs every 6 (six) hours as needed for wheezing or shortness of breath.    [provider]  ?  ALPRAZolam (XANAX) 1 MG tablet Take 1 mg by mouth 3 (three) times daily. 01/06/16   [provider]  ?carvedilol (COREG) 25 MG tablet Take 25 mg by mouth 2 (two) times daily.     [provider]  ?dexamethasone (DECADRON) 2 MG tablet Take 1 tablet (2 mg total) by mouth 2 (two) times daily with a meal. 11/03/21 12/03/21  Chrystal, Eulas Post, MD  ?ENTRESTO 49-51 MG Take 1 tablet by mouth 2 (two) times daily. 10/14/20   [provider]  ?FARXIGA 10 MG TABS tablet Take 10 mg by mouth daily. 10/21/20   [provider]  ?fluticasone (FLONASE) 50 MCG/ACT nasal spray Place 1 spray into both nostrils daily as needed for allergies.  01/20/17   [provider]  ?Fluticasone-Umeclidin-Vilant (TRELEGY ELLIPTA) 100-62.5-25 MCG/ACT AEPB Inhale into the lungs.    [provider]  ?furosemide (LASIX) 40 MG tablet Take 40 mg by mouth.    [provider]  ?HYDROcodone-acetaminophen (HYCET) 7.5-325 mg/15 ml solution 10-15 cc PO every 4-6 hours as needed for pain 09/23/21   Clyde Canterbury, MD  ?ipratropium-albuterol (DUONEB) 0.5-2.5 (3) MG/3ML SOLN Take 3 mLs by

## 2021-12-10 NOTE — ED Notes (Signed)
Pulse check at this time. Resume CPR ?

## 2021-12-10 NOTE — ED Notes (Signed)
1 bicarb given at this time ?

## 2021-12-10 NOTE — ED Notes (Signed)
CPR continued. 

## 2021-12-10 NOTE — ED Notes (Signed)
Pt assisted to toilet. Pt given call bell to call once done. ?

## 2021-12-10 NOTE — ED Notes (Signed)
1 bicarb given 

## 2021-12-10 NOTE — ED Notes (Signed)
Paramedic Student entered room and pt was on toilet. Pt needed help wiping. Pt assisted back to bed. Paramedic Student stepped out of room. ?

## 2021-12-10 NOTE — Progress Notes (Signed)
IVT consulted r/t vasopressor order.  RN states active code and unable to talk.  Advised pt has a port if another line needs to be established.  ?

## 2021-12-10 NOTE — Assessment & Plan Note (Signed)
Patient noted to have elevated troponin levels ?Probably secondary to demand ischemia from tachycardia ?We will cycle troponin levels ?Obtain 2D echocardiogram to rule out regional wall motion abnormality ?

## 2021-12-10 NOTE — ED Notes (Signed)
Pulse check at this time cpr resumed ?

## 2021-12-10 NOTE — ED Notes (Signed)
Pt back in bed.  Pt given warm blankets and pillow by EDT Felicia. Pt repositioned and resting at this time. [place] at bedside ?

## 2021-12-10 NOTE — ED Notes (Signed)
MD [facility] informed of pt's critical test results of WBC, trop and Neutrophils. ?

## 2021-12-10 NOTE — Death Summary Note (Signed)
? ?DEATH SUMMARY  ? ?Patient Details  ?Name: [name] ?MRN: [id number] ?DOB: Jan 08, 1956 ?WGY:KZLD, Nyra Jabs, MD ?Admission/Discharge Information  ? ?Admit Date:  11/18/21  ?Date of Death: Date of Death: 2021/11/18  ?Time of Death: Time of Death: Oct 03, 1914  ?Length of Stay: 0  ? ?Principle Cause of death:  ?Septic shock, ?Neutropenia associated with mucositis due to antineoplastic therapy ?Acute kidney injury ?Ischemic cardiomyopathy ?Chronic systolic heart failure ? ?Hospital Diagnoses: ?Principal Problem: ?  Neutropenia associated with mucositis due to antineoplastic therapy ([place]) ?Active Problems: ?  Sepsis ([place]) ?  AKI (acute kidney injury) ([place]) ?  Elevated troponin ?  Thrombocytopenia ([place]) ?  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  Chronic systolic CHF (congestive heart failure) ([place]) ?  COPD (chronic obstructive pulmonary disease) ([place]) ? ? ?Hospital Course: ?[name] is a 66 y.o. 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  His last chemotherapy was 4 days prior to presentation and last radiation therapy was 3 days prior to his presentation. ?He complains of having multiple episodes of loose watery stools and denies any recent antibiotic use. ?He had abdominal pain mostly in the periumbilical area which he rated a 5 x 10 in intensity at its worst.  Pain was nonradiating.  Is it is associated with diarrhea but he denied having any nausea or vomiting.  He had chills but denied having any fever. ?He denied having any chest pain, no shortness of breath no headache,  no dizziness, no lightheadedness, no leg swelling, no blurred vision or any focal deficit. ?Patient went into cardiac arrest at about 5:51 PM and ACLS protocol was initiated.  He received multiple doses of epi, serum bicarbonate, IV magnesium, D50 due to hypoglycemia and IV Levophed. ?He had multiple episodes of transient ROSC and CPR had to be reinitiated on multiple occasions. ?Goals of care conversation was held with patient's wife, father and stepmother and he decided to make patient a DO NOT RESUSCITATE and keep him comfortable. ?Patient expired at 1916 p.m. ? 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 ?Continue Carafate and Magic mouthwash ? ? ?Sepsis ([place]) ?In a patient with metastatic squamous cell carcinoma on chemo and radiation therapy. ?Patient is hypothermic but is tachycardic and tachypneic with neutropenia (ANC 740) and lactic acidosis. ?Unclear source of sepsis at this time.  UA still pending ?Appreciate oncology input and patient will be started on colony-stimulating factors for his severe neutropenia ?He received 2 L IV fluid bolus in the ER ?Judicious IV fluid resuscitation due to known history of cardiomyopathy ?We will place patient empirically on cefepime 2 g IV every 12 ?Monitor respiratory status closely ?Follow-up results of blood cultures ? ?AKI (acute kidney injury) ([place]) ?Appears to be multifactorial related to poor oral intake, GI losses from diarrhea and medication induced. ?Place patient on IV  fluids ?Hold Lasix and Entresto ?Encourage oral intake ?Repeat renal parameters in a.m. ? ?Elevated troponin ?Patient noted to have elevated troponin levels ?Probably secondary  to demand ischemia from tachycardia ?We will cycle troponin levels ?Obtain 2D echocardiogram to rule out regional wall motion abnormality ? ?Thrombocytopenia ([place]) ?Most likely related to recent chemotherapy ?Monitor closely during this hospitalization ?Hold off on pharmacologic DVT prophylaxis due to increased risk for bleeding ? 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 ?Chronic systolic CHF (congestive heart failure) ([place]) ?Stable and not acutely exacerbate ?Patient's last known LVEF is less than 20% from a 2D echocardiogram which was done 06/22 ?Hold Entresto, Lasix and carvedilol due to AKI and relative hypotension ?Monitor respiratory status closely ? ?COPD (chronic obstructive pulmonary disease) ([place]) ?Stable and not acutely exacerbated ?Continue as needed bronchodilator therapy as well as inhaled steroids ? ? ? ? ?  ? ? ?Procedures:  ? ?Consultations: Oncology, critical care ? ?The results of significant diagnostics from this hospitalization (including imaging, microbiology, ancillary and laboratory) are listed below for reference.  ? ?Significant Diagnostic Studies: ?DG Chest 1 View ? ?Result Date: 17-Nov-2021 ?CLINICAL DATA:  Status post intubation.  CPR in progress. EXAM: CHEST  1 VIEW COMPARISON:  Same day. FINDINGS: Endotracheal tube is noted in grossly good position. Distal tip appears to be 3 cm above the carina. Right internal jugular Port-A-Cath is unchanged. Evaluation of the lungs is limited due to overlying artifact. IMPRESSION: Endotracheal tube appears to be in grossly good position, although imaging is limited due to overlying artifact. Electronically Signed   By: Marijo Conception M.D.   On: 11-17-2021 18:35  ? ?DG Chest 2 View ? ?Result Date: 11-17-21 ?CLINICAL DATA:  Weakness EXAM: CHEST - 2 VIEW  COMPARISON:  Chest x-ray dated December 14, 2020 FINDINGS: Right chest wall port with tip positioned over the expected area of the superior cavoatrial junction. Cardiomegaly. Clear lungs. No pleural effusion or pneumothorax. IMPRESSION: Unchanged cardiomegaly.  Lungs are clear. Electronically Signed   By: Yetta Glassman M.D.   On: 2021/11/17 10:56  ? ?CT SOFT TISSUE NECK W CONTRAST ? ?Result Date: 17-Nov-2021 ?CLINICAL DATA:  Soft tissue swelling, infection suspected, neck xray done Patient reports difficulty swallowing food getting stuck in his neck liquids come back evaluate for obstruction EXAM: CT NECK WITH CONTRAST TECHNIQUE: Multidetector CT imaging of the neck was performed using the standard protocol following the bolus administration of intravenous contrast. RADIATION DOSE REDUCTION: This exam was performed according to the departmental dose-optimization program which includes automated exposure control, adjustment of the mA and/or kV according to patient size and/or use of iterative reconstruction technique. CONTRAST:  85m OMNIPAQUE IOHEXOL 300 MG/ML  SOLN COMPARISON:  None. FINDINGS: Pharynx and larynx: Normal. No mass or swelling. Salivary glands: No inflammation, mass, or stone. Thyroid: Normal. Lymph nodes: Decreased size in the previously noted and treated left level V nodes without pathologically enlarged nodes. Index 5 mm short axis left level V node on series 3, image 52, previously 11 mm. Vascular: Limited assessment due to non arterial timing. Major arteries appear grossly patent in the neck. Limited intracranial: Negative. Visualized orbits: Negative. Mastoids and visualized paranasal sinuses: Clear. Skeleton: Degenerative changes. Upper chest: Visualized lung apices are clear. Mediastinal nodes better characterized on prior PET-CT. IMPRESSION: 1. Decreased size in the previously noted  left level V nodes without pathologically enlarged nodes, compatible with positive treatment response. 2. No  evidence of mass lesion or edema in the neck. 3. Mildly patulous lower esophagus, partially imaged. Endoscopy or esophagram could further evaluate the esophagus if clinically warranted. Electronically Sign

## 2021-12-10 NOTE — ED Notes (Signed)
Pt up to toilet at this time. Pt has another loose stool BM. Pt cleaned up and linens changed. ?

## 2021-12-10 NOTE — ED Notes (Signed)
Pulse check at  this time. Cpr resumed ?

## 2021-12-10 NOTE — ED Notes (Signed)
Pulse check no pulse CPR restarted ?

## 2021-12-10 NOTE — ED Notes (Signed)
MD messaged regarding meds for pain and diarrhea ? ?

## 2021-12-10 NOTE — ED Provider Notes (Signed)
? ? ?Department of Emergency Medicine ?CODE BLUE CONSULTATION ? ? ? ?Code Blue CONSULT NOTE ? ?Chief Complaint: Cardiac arrest/unresponsive  ? ?Level V Caveat: Unresponsive ? ?History of present illness: I was contacted by the hospital for a CODE BLUE cardiac arrest in the ED and presented to the patient's bedside.  ? ?Patient's admitted to the hospitalist service, he was being attended to by ED staff did use the bathroom and then suddenly witnessed to become unresponsive in the bed. ? ? ?ROS: Unable to obtain, Level V caveat ? ?Scheduled Meds: ? ALPRAZolam  1 mg Oral TID  ? dexamethasone  2 mg Oral BID WC  ? etomidate      ? [START ON 11/10/2021] fluticasone furoate-vilanterol  1 puff Inhalation Daily  ? And  ? [START ON 11/10/2021] umeclidinium bromide  1 puff Inhalation Daily  ? pantoprazole (PROTONIX) IV  40 mg Intravenous Daily  ? rocuronium bromide      ? sucralfate  1 g Oral TID  ? Tbo-filgastrim (GRANIX) SQ  300 mcg Subcutaneous Daily  ? ?Continuous Infusions: ? acetaminophen    ? ceFEPime (MAXIPIME) IV Stopped (09-Dec-2021 1519)  ? lactated ringers 125 mL/hr at 12-09-21 1719  ? ?PRN Meds:.acetaminophen **OR** acetaminophen, lidocaine-prilocaine, magic mouthwash w/lidocaine, ondansetron **OR** ondansetron (ZOFRAN) IV ?Past Medical History:  ?Diagnosis Date  ? Anxiety   ? a.) on BZO therapy  ? Aortic atherosclerosis ([place])   ? Arthritis   ? knees, lower back  ? Asthma   ? Cardiomyopathy ([place])   ? a.) TTE 03/24/2004: EF 12%; sev LV dilation; severe global HK. b.) TTE 05/05/2011: EF 26%; LA/LV mod dilated; mild MR. c.) TTE 12/15/2020: EF <20%; mod-sev LV dilation; mod LA dilation; mild MR; G3DD.  ? Carotid artery stenosis   ? CHF (congestive heart failure) ([place])   ? Chronic back pain   ? Chronic pain syndrome   ? CKD (chronic kidney disease), stage III ([place])   ? Complication of anesthesia   ? a.) difficulty maintaining anesthesia due to chronic opioid therapy; taking scheduled opioids QID  ? COPD (chronic obstructive  pulmonary disease) ([place])   ? Coronary artery disease   ? a.) LHC 04/04/2002 -> norm coronary anatomy; EF 20%.  b.)  Coronary CTA 07/28/2012: Ca score 143.4.  LAD, LCx, and RCA with no significant disease. c.) Coronary CTA 04/22/2015: Ca score 194.2; mild pLAD and LCx disease.  ? Diverticulitis   ? a.) s/p bowel resection  ? GERD (gastroesophageal reflux disease)   ? Gout   ? Hepatitis C virus infection without hepatic coma 02/2016  ? a.) genotype 1a;  Tx'd with Ledipasvir / Sofosbuvir course  ? History of ETOH abuse   ? History of kidney stones   ? HLD (hyperlipidemia)   ? Hypertension   ? LBBB (left bundle branch block)   ? Long term current use of antithrombotics/antiplatelets   ? a.) clopidogrel  ? Long term prescription benzodiazepine use   ? Long term prescription opiate use   ? Metastatic squamous cell carcinoma involving lymph node with unknown primary site [facility]) 08/14/2021  ? a.) FNA LEFT cervical lymph node 08/14/2021 -- (+) for metastatic squamous cell carcinoma; primary unknown. b.) PET CT 08/28/2021: LEFT cervical LAD --> 0.9 cm level 2 node (SUV 4.0), 1.2 cm level 3 node (SUV 5.2), smaller FDG avid cervical nodes noted, but too small to characterize.  ? Microcytic anemia   ? Prediabetes   ? PVD (peripheral vascular disease) with claudication ([place])   ?  a.) 10/2019 --> s/p mechanical thrombectomy, PTCA, stent placement to RIGHT SFA and popliteal arteries  ? SBO (small bowel obstruction) ([place]) 2007  ? a.) x 2  ? Symptomatic bradycardia   ? ?Past Surgical History:  ?Procedure Laterality Date  ? BACK SURGERY    ? CHOLECYSTECTOMY    ? COLON RESECTION  2007  ? due to diverticulitis  ? COLONOSCOPY WITH PROPOFOL N/A 03/29/2017  ? Procedure: COLONOSCOPY WITH PROPOFOL;  Surgeon: Lucilla Lame, MD;  Location[facility] ENDOSCOPY;  Service: Endoscopy;  Laterality: N/A;  ? CYSTOSCOPY/URETEROSCOPY/HOLMIUM LASER/STENT PLACEMENT Left 07/16/2019  ? Procedure: CYSTOSCOPY/URETEROSCOPY/HOLMIUM LASER/STENT Exchange;  Surgeon:  Hollice Espy, MD;  Location: [facility] ORS;  Service: Urology;  Laterality: Left;  ? ESOPHAGOGASTRODUODENOSCOPY (EGD) WITH PROPOFOL N/A 03/29/2017  ? Procedure: ESOPHAGOGASTRODUODENOSCOPY (EGD) WITH PROPOFOL;  Surgeon: Lucilla Lame, MD;  Location[facility] ENDOSCOPY;  Service: Endoscopy;  Laterality: N/A;  ? FINGER SURGERY  2007  ? cut finger off with saw and was reattached  ? HERNIA REPAIR    ? HOLMIUM LASER APPLICATION N/A 95/18/8416  ? Procedure: HOLMIUM LASER APPLICATION;  Surgeon: Hollice Espy, MD;  Location: [facility] ORS;  Service: Urology;  Laterality: N/A;  ? IR IMAGING GUIDED PORT INSERTION  09/09/2021  ? IR NEPHROSTOMY PLACEMENT LEFT  05/28/2019  ? KIDNEY SURGERY  2021  ? KNEE ARTHROSCOPY Bilateral   ? LARYNGOSCOPY Bilateral 09/23/2021  ? Procedure: DIRECT LARYNGOSCOPY WITH BIOPSIES;  Surgeon: Clyde Canterbury, MD;  Location: [facility] ORS;  Service: ENT;  Laterality: Bilateral;  ? LEFT HEART CATH AND CORONARY ANGIOGRAPHY Left 04/04/2002  ? Procedure: LEFT HEART CATH AND CORONARY ANGIOGRAPHY; Location: [place]; Surgeon: Bartholome Bill, MD  ? LOWER EXTREMITY ANGIOGRAPHY Right 11/09/2017  ? Procedure: LOWER EXTREMITY ANGIOGRAPHY;  Surgeon: Katha Cabal, MD;  Location: [place] CV LAB;  Service: Cardiovascular;  Laterality: Right;  ? LOWER EXTREMITY ANGIOGRAPHY Right 11/21/2018  ? Procedure: LOWER EXTREMITY ANGIOGRAPHY;  Surgeon: Katha Cabal, MD;  Location: [place] CV LAB;  Service: Cardiovascular;  Laterality: Right;  ? LOWER EXTREMITY ANGIOGRAPHY Right 09/18/2019  ? Procedure: LOWER EXTREMITY ANGIOGRAPHY;  Surgeon: Katha Cabal, MD;  Location: [place] CV LAB;  Service: Cardiovascular;  Laterality: Right;  ? NEPHROLITHOTOMY Left 05/28/2019  ? Procedure: NEPHROLITHOTOMY PERCUTANEOUS;  Surgeon: Hollice Espy, MD;  Location: [facility] ORS;  Service: Urology;  Laterality: Left;  ? SPINAL FUSION    ? TONSILLECTOMY AND ADENOIDECTOMY Left 09/23/2021  ? Procedure: TONSILLECTOMY AND ADENOIDECTOMY;   Surgeon: Clyde Canterbury, MD;  Location: [facility] ORS;  Service: ENT;  Laterality: Left;  ? ?Social History  ? ?Socioeconomic History  ? Marital status: Married  ?  Spouse name: Vaughan Basta  ? Number of children: 12  ? Years of education: Not on file  ? Highest education level: Not on file  ?Occupational History  ? Occupation: disabled  ?Tobacco Use  ? Smoking status: Every Day  ?  Packs/day: 0.50  ?  Years: 25.00  ?  Pack years: 12.50  ?  Types: Cigarettes  ? Smokeless tobacco: Never  ?Vaping Use  ? Vaping Use: Never used  ?Substance and Sexual Activity  ? Alcohol use: No  ? Drug use: No  ? Sexual activity: Not Currently  ?Other Topics Concern  ? Not on file  ?Social History Narrative  ? 2 great children living with pt. & wife; in [place]. Smoker 1/2ppd; no alcohol. 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   ? ?Social Determinants of Health  ? ?Financial Resource Strain: Not on file  ?Food  Insecurity: Not on file  ?Transportation Needs: Not on file  ?Physical Activity: Not on file  ?Stress: Not on file  ?Social Connections: Not on file  ?Intimate Partner Violence: Not on file  ? ?Allergies  ?Allergen Reactions  ? Codeine Itching  ? Dilaudid [Hydromorphone] Other (See Comments)  ?  Severe headache  ? ? ?Last set of Vital Signs (not current) ?Vitals:  ? 12-07-2021 1715 07-Dec-2021 1800  ?BP:  (!) 71/35  ?Pulse: (!) 115 66  ?Resp: 17   ?Temp:    ?SpO2: 93% 99%  ? ?  ? ?Physical Exam ? ?Gen: unresponsive ?Cardiovascular: pulseless  ?Resp: apneic. Breath sounds equal bilaterally with bagging  ?Abd: nondistended  ?Neuro: GCS 3, unresponsive to pain  ?HEENT: Slight blood is noted posterior pharynx, gag reflex absent  ?Neck: No crepitus  ?Musculoskeletal: No deformity  ?Skin: Cool to touch peripherally with mild cyanosis of the nailbeds ? ?Procedures  ?INTUBATION ?Performed by: Delman Kitten ?Required items: required blood products, implants, devices, and special equipment available ?Patient identity confirmed: provided demographic  data and hospital-assigned identification number ?Time out: Immediately prior to procedure a "time out" was called to verify the correct patient, procedure, equipment, support staff and site/side marked as required

## 2021-12-10 NOTE — ED Notes (Signed)
Cpr resumed pulses lost ? ?

## 2021-12-10 NOTE — Assessment & Plan Note (Addendum)
In a patient with metastatic squamous cell carcinoma on chemo and radiation therapy. ?Patient is hypothermic but is tachycardic and tachypneic with neutropenia (ANC 740) and lactic acidosis. ?Unclear source of sepsis at this time.  UA still pending ?Appreciate oncology input and patient will be started on colony-stimulating factors for his severe neutropenia ?He received 2 L IV fluid bolus in the ER ?Judicious IV fluid resuscitation due to known history of cardiomyopathy ?We will place patient empirically on cefepime 2 g IV every 12 ?Monitor respiratory status closely ?Follow-up results of blood cultures ?

## 2021-12-10 NOTE — ED Notes (Signed)
Pt belongings given to family at bedside for keeping. 1 bag of clothing, 1 silver wedding ring removed from pt and given to wife.  ?

## 2021-12-10 NOTE — ED Notes (Signed)
1 epi given 

## 2021-12-10 NOTE — ED Notes (Addendum)
[name], Medical Examiner on call, contacted by Probation officer due to no response by attending Braun. Per [name], this pt would not meet criteria for ME and that he would be ruled as a natural death. Pt's RN notified as well as attending, [name].  ? ? ?All lines and drains to be removed at this time and pt to be transitioned into white bag then transported to morgue. Per family (Spouse) request no organ donation as well as no preparation of eyes. [name] informed that she would receive a call from [facility].  ?

## 2021-12-10 NOTE — ED Notes (Signed)
CPR resumed pulses weak at this time. ?

## 2021-12-10 NOTE — ED Notes (Addendum)
Patient had episode of diarrhea. Patient cleaned and new brief placed at this time. ? ? ?500 mL bolus of LR started at this time per [place], MD due to patient's BP of 78/66. ?

## 2021-12-10 NOTE — ED Notes (Signed)
D50 given at this time. ?

## 2021-12-10 NOTE — ED Notes (Signed)
1 epi given now ?

## 2021-12-10 DEATH — deceased
